# Patient Record
Sex: Male | Born: 1965 | Race: White | Hispanic: No | Marital: Married | Smoking: Former smoker
Health system: Southern US, Community
[De-identification: ages and names within clinical notes are randomized; demographics above are authoritative.]

## PROBLEM LIST (undated history)

## (undated) DIAGNOSIS — G4733 Obstructive sleep apnea (adult) (pediatric): Secondary | ICD-10-CM

## (undated) DIAGNOSIS — I10 Essential (primary) hypertension: Secondary | ICD-10-CM

## (undated) DIAGNOSIS — I639 Cerebral infarction, unspecified: Secondary | ICD-10-CM

## (undated) DIAGNOSIS — E119 Type 2 diabetes mellitus without complications: Secondary | ICD-10-CM

## (undated) DIAGNOSIS — M1990 Unspecified osteoarthritis, unspecified site: Secondary | ICD-10-CM

## (undated) DIAGNOSIS — J449 Chronic obstructive pulmonary disease, unspecified: Secondary | ICD-10-CM

## (undated) DIAGNOSIS — I82409 Acute embolism and thrombosis of unspecified deep veins of unspecified lower extremity: Secondary | ICD-10-CM

## (undated) DIAGNOSIS — I509 Heart failure, unspecified: Secondary | ICD-10-CM

## (undated) DIAGNOSIS — R569 Unspecified convulsions: Secondary | ICD-10-CM

## (undated) DIAGNOSIS — Z9889 Other specified postprocedural states: Secondary | ICD-10-CM

## (undated) DIAGNOSIS — J984 Other disorders of lung: Secondary | ICD-10-CM

## (undated) DIAGNOSIS — I6529 Occlusion and stenosis of unspecified carotid artery: Secondary | ICD-10-CM

## (undated) DIAGNOSIS — F32A Depression, unspecified: Secondary | ICD-10-CM

## (undated) DIAGNOSIS — F329 Major depressive disorder, single episode, unspecified: Secondary | ICD-10-CM

## (undated) HISTORY — PX: BRAIN SURGERY: SHX531

## (undated) HISTORY — PX: BRAIN TUMOR EXCISION: SHX577

## (undated) HISTORY — PX: CATARACT EXTRACTION: SUR2

---

## 2014-12-01 DIAGNOSIS — J449 Chronic obstructive pulmonary disease, unspecified: Secondary | ICD-10-CM | POA: Insufficient documentation

## 2017-11-10 DIAGNOSIS — I639 Cerebral infarction, unspecified: Secondary | ICD-10-CM

## 2017-11-10 HISTORY — DX: Cerebral infarction, unspecified: I63.9

## 2017-12-02 ENCOUNTER — Inpatient Hospital Stay (HOSPITAL_COMMUNITY): Payer: Medicare Other

## 2017-12-02 ENCOUNTER — Inpatient Hospital Stay (HOSPITAL_COMMUNITY)
Admission: EM | Admit: 2017-12-02 | Discharge: 2017-12-07 | DRG: 064 | Disposition: A | Discharge status: Rehabilitation Facility | Payer: Medicare Other | Attending: Family Medicine | Admitting: Family Medicine

## 2017-12-02 ENCOUNTER — Emergency Department (HOSPITAL_COMMUNITY): Payer: Medicare Other

## 2017-12-02 ENCOUNTER — Other Ambulatory Visit: Payer: Self-pay

## 2017-12-02 ENCOUNTER — Encounter (HOSPITAL_COMMUNITY): Payer: Self-pay | Admitting: Emergency Medicine

## 2017-12-02 DIAGNOSIS — I63511 Cerebral infarction due to unspecified occlusion or stenosis of right middle cerebral artery: Principal | ICD-10-CM | POA: Diagnosis present

## 2017-12-02 DIAGNOSIS — E1165 Type 2 diabetes mellitus with hyperglycemia: Secondary | ICD-10-CM | POA: Diagnosis not present

## 2017-12-02 DIAGNOSIS — E785 Hyperlipidemia, unspecified: Secondary | ICD-10-CM | POA: Diagnosis present

## 2017-12-02 DIAGNOSIS — G8194 Hemiplegia, unspecified affecting left nondominant side: Secondary | ICD-10-CM | POA: Diagnosis not present

## 2017-12-02 DIAGNOSIS — E119 Type 2 diabetes mellitus without complications: Secondary | ICD-10-CM

## 2017-12-02 DIAGNOSIS — G936 Cerebral edema: Secondary | ICD-10-CM | POA: Diagnosis present

## 2017-12-02 DIAGNOSIS — R471 Dysarthria and anarthria: Secondary | ICD-10-CM | POA: Diagnosis present

## 2017-12-02 DIAGNOSIS — R200 Anesthesia of skin: Secondary | ICD-10-CM | POA: Diagnosis not present

## 2017-12-02 DIAGNOSIS — E871 Hypo-osmolality and hyponatremia: Secondary | ICD-10-CM | POA: Diagnosis not present

## 2017-12-02 DIAGNOSIS — Z9119 Patient's noncompliance with other medical treatment and regimen: Secondary | ICD-10-CM | POA: Diagnosis not present

## 2017-12-02 DIAGNOSIS — I639 Cerebral infarction, unspecified: Secondary | ICD-10-CM | POA: Diagnosis present

## 2017-12-02 DIAGNOSIS — Z7901 Long term (current) use of anticoagulants: Secondary | ICD-10-CM | POA: Diagnosis not present

## 2017-12-02 DIAGNOSIS — Z86718 Personal history of other venous thrombosis and embolism: Secondary | ICD-10-CM

## 2017-12-02 DIAGNOSIS — Z66 Do not resuscitate: Secondary | ICD-10-CM | POA: Diagnosis present

## 2017-12-02 DIAGNOSIS — Z794 Long term (current) use of insulin: Secondary | ICD-10-CM

## 2017-12-02 DIAGNOSIS — R1310 Dysphagia, unspecified: Secondary | ICD-10-CM | POA: Diagnosis present

## 2017-12-02 DIAGNOSIS — Z7982 Long term (current) use of aspirin: Secondary | ICD-10-CM | POA: Diagnosis not present

## 2017-12-02 DIAGNOSIS — T50905A Adverse effect of unspecified drugs, medicaments and biological substances, initial encounter: Secondary | ICD-10-CM | POA: Diagnosis present

## 2017-12-02 DIAGNOSIS — E1169 Type 2 diabetes mellitus with other specified complication: Secondary | ICD-10-CM | POA: Diagnosis not present

## 2017-12-02 DIAGNOSIS — I10 Essential (primary) hypertension: Secondary | ICD-10-CM | POA: Diagnosis present

## 2017-12-02 DIAGNOSIS — E662 Morbid (severe) obesity with alveolar hypoventilation: Secondary | ICD-10-CM | POA: Diagnosis present

## 2017-12-02 DIAGNOSIS — R262 Difficulty in walking, not elsewhere classified: Secondary | ICD-10-CM | POA: Diagnosis present

## 2017-12-02 DIAGNOSIS — G92 Toxic encephalopathy: Secondary | ICD-10-CM | POA: Diagnosis present

## 2017-12-02 DIAGNOSIS — J449 Chronic obstructive pulmonary disease, unspecified: Secondary | ICD-10-CM | POA: Diagnosis present

## 2017-12-02 DIAGNOSIS — G4733 Obstructive sleep apnea (adult) (pediatric): Secondary | ICD-10-CM | POA: Diagnosis present

## 2017-12-02 DIAGNOSIS — E781 Pure hyperglyceridemia: Secondary | ICD-10-CM | POA: Diagnosis present

## 2017-12-02 DIAGNOSIS — I6523 Occlusion and stenosis of bilateral carotid arteries: Secondary | ICD-10-CM | POA: Diagnosis present

## 2017-12-02 DIAGNOSIS — I69354 Hemiplegia and hemiparesis following cerebral infarction affecting left non-dominant side: Secondary | ICD-10-CM | POA: Diagnosis not present

## 2017-12-02 DIAGNOSIS — Z79899 Other long term (current) drug therapy: Secondary | ICD-10-CM

## 2017-12-02 DIAGNOSIS — Z885 Allergy status to narcotic agent status: Secondary | ICD-10-CM

## 2017-12-02 DIAGNOSIS — D696 Thrombocytopenia, unspecified: Secondary | ICD-10-CM | POA: Diagnosis not present

## 2017-12-02 DIAGNOSIS — E1151 Type 2 diabetes mellitus with diabetic peripheral angiopathy without gangrene: Secondary | ICD-10-CM | POA: Diagnosis present

## 2017-12-02 DIAGNOSIS — I6521 Occlusion and stenosis of right carotid artery: Secondary | ICD-10-CM | POA: Diagnosis present

## 2017-12-02 DIAGNOSIS — F17210 Nicotine dependence, cigarettes, uncomplicated: Secondary | ICD-10-CM | POA: Diagnosis present

## 2017-12-02 DIAGNOSIS — E1142 Type 2 diabetes mellitus with diabetic polyneuropathy: Secondary | ICD-10-CM | POA: Diagnosis present

## 2017-12-02 DIAGNOSIS — E669 Obesity, unspecified: Secondary | ICD-10-CM | POA: Diagnosis not present

## 2017-12-02 DIAGNOSIS — Z9114 Patient's other noncompliance with medication regimen: Secondary | ICD-10-CM

## 2017-12-02 DIAGNOSIS — Z6838 Body mass index (BMI) 38.0-38.9, adult: Secondary | ICD-10-CM

## 2017-12-02 DIAGNOSIS — M25512 Pain in left shoulder: Secondary | ICD-10-CM | POA: Diagnosis present

## 2017-12-02 HISTORY — DX: Type 2 diabetes mellitus without complications: E11.9

## 2017-12-02 HISTORY — DX: Unspecified convulsions: R56.9

## 2017-12-02 HISTORY — DX: Unspecified osteoarthritis, unspecified site: M19.90

## 2017-12-02 HISTORY — DX: Chronic obstructive pulmonary disease, unspecified: J44.9

## 2017-12-02 HISTORY — DX: Essential (primary) hypertension: I10

## 2017-12-02 HISTORY — DX: Acute embolism and thrombosis of unspecified deep veins of unspecified lower extremity: I82.409

## 2017-12-02 LAB — I-STAT CHEM 8, ED
BUN: 12 mg/dL (ref 6–20)
CALCIUM ION: 1.13 mmol/L — AB (ref 1.15–1.40)
CHLORIDE: 100 mmol/L — AB (ref 101–111)
Creatinine, Ser: 0.7 mg/dL (ref 0.61–1.24)
GLUCOSE: 390 mg/dL — AB (ref 65–99)
HCT: 48 % (ref 39.0–52.0)
HEMOGLOBIN: 16.3 g/dL (ref 13.0–17.0)
Potassium: 4.1 mmol/L (ref 3.5–5.1)
SODIUM: 131 mmol/L — AB (ref 135–145)
TCO2: 24 mmol/L (ref 22–32)

## 2017-12-02 LAB — DIFFERENTIAL
BASOS ABS: 0.1 10*3/uL (ref 0.0–0.1)
Basophils Relative: 1 %
EOS ABS: 0.1 10*3/uL (ref 0.0–0.7)
Eosinophils Relative: 2 %
LYMPHS PCT: 31 %
Lymphs Abs: 2.4 10*3/uL (ref 0.7–4.0)
Monocytes Absolute: 0.7 10*3/uL (ref 0.1–1.0)
Monocytes Relative: 8 %
NEUTROS ABS: 4.6 10*3/uL (ref 1.7–7.7)
Neutrophils Relative %: 58 %

## 2017-12-02 LAB — URINALYSIS, ROUTINE W REFLEX MICROSCOPIC
BILIRUBIN URINE: NEGATIVE
Bacteria, UA: NONE SEEN
HGB URINE DIPSTICK: NEGATIVE
KETONES UR: NEGATIVE mg/dL
Leukocytes, UA: NEGATIVE
NITRITE: NEGATIVE
PH: 5 (ref 5.0–8.0)
Protein, ur: 100 mg/dL — AB
Specific Gravity, Urine: 1.028 (ref 1.005–1.030)

## 2017-12-02 LAB — RAPID URINE DRUG SCREEN, HOSP PERFORMED
Amphetamines: NOT DETECTED
Benzodiazepines: NOT DETECTED
Cocaine: NOT DETECTED
Opiates: NOT DETECTED
Tetrahydrocannabinol: NOT DETECTED

## 2017-12-02 LAB — CBC
HCT: 47.7 % (ref 39.0–52.0)
Hemoglobin: 17.3 g/dL — ABNORMAL HIGH (ref 13.0–17.0)
MCH: 31.5 pg (ref 26.0–34.0)
MCHC: 36.3 g/dL — AB (ref 30.0–36.0)
MCV: 86.7 fL (ref 78.0–100.0)
Platelets: 139 10*3/uL — ABNORMAL LOW (ref 150–400)
RBC: 5.5 MIL/uL (ref 4.22–5.81)
RDW: 13.7 % (ref 11.5–15.5)
WBC: 7.9 10*3/uL (ref 4.0–10.5)

## 2017-12-02 LAB — COMPREHENSIVE METABOLIC PANEL
ALBUMIN: 3.9 g/dL (ref 3.5–5.0)
ALK PHOS: 76 U/L (ref 38–126)
ALT: 23 U/L (ref 17–63)
AST: 19 U/L (ref 15–41)
Anion gap: 11 (ref 5–15)
BILIRUBIN TOTAL: 0.5 mg/dL (ref 0.3–1.2)
BUN: 12 mg/dL (ref 6–20)
CO2: 22 mmol/L (ref 22–32)
Calcium: 8.9 mg/dL (ref 8.9–10.3)
Chloride: 94 mmol/L — ABNORMAL LOW (ref 101–111)
Creatinine, Ser: 0.77 mg/dL (ref 0.61–1.24)
GFR calc Af Amer: >60 mL/min (ref >60)
GFR calc non Af Amer: >60 mL/min (ref >60)
GLUCOSE: 378 mg/dL — AB (ref 65–99)
POTASSIUM: 3.9 mmol/L (ref 3.5–5.1)
Sodium: 127 mmol/L — ABNORMAL LOW (ref 135–145)
Total Protein: 6.4 g/dL — ABNORMAL LOW (ref 6.5–8.1)

## 2017-12-02 LAB — APTT: aPTT: 32 seconds (ref 24–36)

## 2017-12-02 LAB — PROTIME-INR
INR: 0.85
PROTHROMBIN TIME: 11.6 s (ref 11.4–15.2)

## 2017-12-02 LAB — ETHANOL

## 2017-12-02 MED ORDER — SENNOSIDES-DOCUSATE SODIUM 8.6-50 MG PO TABS
1.0000 | ORAL_TABLET | Freq: Every evening | ORAL | Status: DC | PRN
  Administered 2017-12-05: 1 via ORAL
  Filled 2017-12-02 (×2): qty 1

## 2017-12-02 MED ORDER — ATORVASTATIN CALCIUM 20 MG PO TABS
20.0000 mg | ORAL_TABLET | Freq: Every day | ORAL | Status: DC
  Administered 2017-12-03 – 2017-12-04 (×2): 20 mg via ORAL
  Filled 2017-12-02 (×2): qty 1

## 2017-12-02 MED ORDER — STROKE: EARLY STAGES OF RECOVERY BOOK
Freq: Once | Status: DC
  Filled 2017-12-02 (×2): qty 1

## 2017-12-02 MED ORDER — ASPIRIN 325 MG PO TABS
325.0000 mg | ORAL_TABLET | Freq: Every day | ORAL | Status: DC
  Administered 2017-12-03 – 2017-12-06 (×4): 325 mg via ORAL
  Filled 2017-12-02 (×4): qty 1

## 2017-12-02 MED ORDER — GABAPENTIN 100 MG PO CAPS
100.0000 mg | ORAL_CAPSULE | Freq: Three times a day (TID) | ORAL | Status: DC
  Administered 2017-12-03 – 2017-12-06 (×13): 100 mg via ORAL
  Filled 2017-12-02 (×13): qty 1

## 2017-12-02 MED ORDER — SODIUM CHLORIDE 0.9 % IV SOLN
INTRAVENOUS | Status: DC
  Administered 2017-12-03: 01:00:00 via INTRAVENOUS

## 2017-12-02 MED ORDER — NICOTINE 21 MG/24HR TD PT24
21.0000 mg | MEDICATED_PATCH | Freq: Once | TRANSDERMAL | Status: AC
  Administered 2017-12-02: 21 mg via TRANSDERMAL
  Filled 2017-12-02: qty 1

## 2017-12-02 MED ORDER — ASPIRIN 300 MG RE SUPP: 300.0000 mg | Freq: Every day | RECTAL | Status: DC

## 2017-12-02 MED ORDER — FLUOXETINE HCL 20 MG PO CAPS
20.0000 mg | ORAL_CAPSULE | Freq: Every day | ORAL | Status: DC
  Administered 2017-12-03 – 2017-12-06 (×4): 20 mg via ORAL
  Filled 2017-12-02 (×4): qty 1

## 2017-12-02 MED ORDER — ENOXAPARIN SODIUM 40 MG/0.4ML ~~LOC~~ SOLN
40.0000 mg | SUBCUTANEOUS | Status: DC
  Administered 2017-12-03: 40 mg via SUBCUTANEOUS
  Filled 2017-12-02: qty 0.4

## 2017-12-02 MED ORDER — GEMFIBROZIL 600 MG PO TABS
600.0000 mg | ORAL_TABLET | Freq: Two times a day (BID) | ORAL | Status: DC
  Administered 2017-12-03: 600 mg via ORAL
  Filled 2017-12-02 (×2): qty 1

## 2017-12-02 MED ORDER — ACETAMINOPHEN 325 MG PO TABS
650.0000 mg | ORAL_TABLET | ORAL | Status: DC | PRN
  Administered 2017-12-03 – 2017-12-06 (×6): 650 mg via ORAL
  Filled 2017-12-02 (×6): qty 2

## 2017-12-02 MED ORDER — ACETAMINOPHEN 650 MG RE SUPP: 650.0000 mg | RECTAL | Status: DC | PRN

## 2017-12-02 MED ORDER — INSULIN ASPART 100 UNIT/ML ~~LOC~~ SOLN
10.0000 [IU] | Freq: Once | SUBCUTANEOUS | Status: AC
  Administered 2017-12-02: 10 [IU] via SUBCUTANEOUS
  Filled 2017-12-02: qty 1

## 2017-12-02 MED ORDER — INSULIN GLARGINE 100 UNIT/ML ~~LOC~~ SOLN
25.0000 [IU] | Freq: Two times a day (BID) | SUBCUTANEOUS | Status: DC
  Administered 2017-12-03 – 2017-12-04 (×4): 25 [IU] via SUBCUTANEOUS
  Filled 2017-12-02 (×6): qty 0.25

## 2017-12-02 MED ORDER — ACETAMINOPHEN 160 MG/5ML PO SOLN: 650.0000 mg | ORAL | Status: DC | PRN

## 2017-12-02 MED ORDER — MOMETASONE FURO-FORMOTEROL FUM 200-5 MCG/ACT IN AERO
2.0000 | INHALATION_SPRAY | Freq: Two times a day (BID) | RESPIRATORY_TRACT | Status: DC
  Administered 2017-12-03 – 2017-12-07 (×9): 2 via RESPIRATORY_TRACT
  Filled 2017-12-02 (×2): qty 8.8

## 2017-12-02 NOTE — ED Notes (Signed)
Family mbr reports that pt was sitting at the table tonight and was eating but pt was holding the food in his mouth and that the family had to tell the pt to swallow,

## 2017-12-02 NOTE — ED Provider Notes (Signed)
[facility] EMERGENCY DEPARTMENT Provider Note   CSN: [id number] Arrival date & time: 12/02/17  1936     History   Chief Complaint Chief Complaint  Patient presents with  . Numbness    HPI Zachary Deleon is a 52 y.o. male.  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  The patient denies any prior history of stroke, he and his wife for the primary historians, he states that he has had some left arm numbness for the last 3 days and has had some difficulty using that hand.  Specifically he reports and the wife corroborates his story that today while he was on his way to the hospital he was walking down a staircase and was having difficulty using his left hand to grab onto a railing as he descended the stairs.  He is also had some difficulty with speech which the wife reports occurred today for the first time.  The speech was slurred, he was having difficulty getting food onto his fork, he was having difficulty swallowing, he was having difficulty getting his speech out and what came out did not sound normal.  This seems to have resolved according to the wife stating that his speech is back to its normal baseline.  The patient denies any symptoms in his legs and has been able to walk.  He denies any difficulty with vision outside of his normal baseline poor vision.  He does not wear contact lenses and only wears glasses to read.  He denies chest pain shortness of breath vomiting diarrhea fever or swelling of the legs and denies any rashes.  The symptoms in his left arm have been persistent for approximately 3 or more days, the speech was transient lasting less than 1 hour and occurred 2 hours ago while they were at the dinner table.  He no longer has any difficulty with his speech  according to his wife who states he is back to his baseline with regards to that  Past Medical History:  Diagnosis Date  . Diabetes mellitus without complication (HCC)   . DVT (deep venous thrombosis) (HCC)     There are no active problems to display for this patient.   Past Surgical History:  Procedure Laterality Date  . BRAIN SURGERY    . CATARACT EXTRACTION          Home Medications    Prior to Admission medications   Not on File    Family History No family history on file.  Social History Social History   Tobacco Use  . Smoking status: Current Every Day Smoker  . Smokeless tobacco: Never Used  Substance Use Topics  . Alcohol use: Yes  . Drug use: Not Currently     Allergies   Morphine and related   Review of Systems Review of Systems  All other systems reviewed and are negative.    Physical Exam Updated Vital Signs BP (!) 160/97   Pulse 93   Temp 97.9 F (36.6 C) (Oral)   Resp 20   Ht 6\' 2"  (1.88 m)   Wt 136.1 kg (300 lb)   SpO2 98%   BMI 38.52 kg/m   Physical Exam  Constitutional: He appears well-developed and well-nourished. No distress.  HENT:  Head: Normocephalic and atraumatic.  Mouth/Throat: Oropharynx is clear and moist. No oropharyngeal exudate.  Eyes: Pupils are equal, round, and reactive to light. Conjunctivae and EOM are normal. Right eye exhibits no discharge. Left eye exhibits no discharge. No scleral icterus.  Neck: Normal range of motion. Neck supple. No JVD present. No thyromegaly present.  Cardiovascular: Normal rate, regular rhythm, normal heart sounds and intact distal pulses. Exam reveals no gallop and no friction rub.  No murmur heard. Pulmonary/Chest: Effort normal and breath sounds normal. No respiratory distress. He has no wheezes. He has no rales.  Abdominal: Soft. Bowel sounds are normal. He exhibits no distension and no mass. There is no tenderness.  Musculoskeletal: Normal range of motion. He exhibits no edema  or tenderness.  Lymphadenopathy:    He has no cervical adenopathy.  Neurological: He is alert. Coordination normal.  The patient has no facial droop, his speech is clear and goal-directed, cranial nerves III through XII are normal including sensation of the face.  Bilateral lower extremities with normal strength and able to straight leg raise bilaterally.  He is able to grip with both hands 5 out of 5 strength, he does have some decreased sensation over the left arm, he does have some difficulty with finger-nose-finger ataxia on the left side with some dysmetria however there is no pronator drift.  His level of attention is normal as is his mental status.  NIH scale of 2 for some dysmetria and some sensory deficit on the left arm  Skin: Skin is warm and dry. No rash noted. No erythema.  Psychiatric: He has a normal mood and affect. His behavior is normal.  Nursing note and vitals reviewed.    ED Treatments / Results  Labs (all labs ordered are listed, but only abnormal results are displayed) Labs Reviewed  ETHANOL  PROTIME-INR  APTT  CBC  DIFFERENTIAL  COMPREHENSIVE METABOLIC PANEL  RAPID URINE DRUG SCREEN, HOSP PERFORMED  URINALYSIS, ROUTINE W REFLEX MICROSCOPIC  I-STAT CHEM 8, ED  I-STAT TROPONIN, ED    EKG None  Radiology No results found.  Procedures Procedures (including critical care time)  Medications Ordered in ED Medications - No data to display   Initial Impression / Assessment and Plan / ED Course  I have reviewed the triage vital signs and the nursing notes.  Pertinent labs & imaging results that were available during my care of the patient were reviewed by me and considered in my medical decision making (see chart for details).  Clinical Course as of Dec 02 2337  Wynelle LinkSun Dec 02, 2017  2115 CT scan shows postsurgical changes, no acute findings.  The symptoms are consistent with a mild stroke, he will need to be admitted to the hospital.  We will page the  hospitalist for admission at 915 p.m.   [BM]    Clinical Course User Index [BM] Eber HongMiller, Niobe Dick, MD    It is not exactly clear where the patient's ischemia area is however he does appear to have symptoms that could be consistent with an ischemic stroke.  He will go for CT scan of the brain followed by lab work.  He will likely need to be admitted to the hospital for stroke work-up.  With his prior history of tumor 22 years ago as long as it was benign I do not think this will be a recurrence but CT scan will help decipher further the cause of the symptoms.  EKG is unremarkable.  The patient is not in atrial fibrillation but is hypetensive  Vitals:   12/02/17 1941 12/02/17 1944  BP:  (!) 160/97  Pulse:  93  Resp:  20  Temp:  97.9 F (36.6 C)  TempSrc:  Oral  SpO2:  98%  Weight: 136.1 kg (300 lb)   Height: 6\' 2"  (1.88 m)      Final Clinical Impressions(s) / ED Diagnoses   Final diagnoses:  Left arm numbness  Stroke (cerebrum) (HCC)  Stroke (cerebrum) (HCC)       Eber Hong, MD 12/02/17 2341

## 2017-12-02 NOTE — ED Triage Notes (Signed)
Pt c/o numbness to the left arm and hand, he states has been going on for 3-4 days. Family noticed pt has slurred speech at dinner tonight.

## 2017-12-02 NOTE — ED Notes (Signed)
Patient transported to X-ray 

## 2017-12-02 NOTE — ED Notes (Signed)
Pt returned from CT °

## 2017-12-02 NOTE — ED Notes (Signed)
Patient transported to CT 

## 2017-12-02 NOTE — H&P (Signed)
TRH H&P    Patient Demographics:    Zachary Deleon, is a 52 y.o. male  MRN: [id number]  DOB - [birth year]  Admit Date - 12/02/2017  Referring MD/NP/PA: Dr. Hyacinth Meeker  Outpatient Primary MD for the patient is Nelma Rothman, NP  Patient coming from: Home  Chief complaint-numbness of left arm   HPI:    Zachary Deleon  is a 52 y.o. male, with history of diabetes mellitus, tobacco abuse, history of DVT in the past, hypertension who has been noncompliant with his medications and has not taken any medication for past 6 months came to hospital with left arm numbness for past 3 days.  As per patient's wife he was having difficulty walking downstairs case and with difficulty using his left hand to grab onto rolling as he descended the stairs.  Patient also had difficulty with speech.  Speech was slurred. Patient denies difficulty walking.  Denies chest pain or shortness of breath. No history of stroke or seizures in the past. No nausea vomiting or diarrhea. Symptoms have been persistent for past 3 days. In the ED CT of the head was done which was negative for stroke. Patient will be admitted for further stroke work-up.    Review of systems:      All other systems reviewed and are negative.   With Past History of the following :    Past Medical History:  Diagnosis Date  . Arthritis   . COPD (chronic obstructive pulmonary disease) (HCC)   . Diabetes mellitus without complication (HCC)   . DVT (deep venous thrombosis) (HCC)   . Hypertension   . Seizures (HCC)       Past Surgical History:  Procedure Laterality Date  . BRAIN SURGERY    . BRAIN TUMOR EXCISION     performed at [facility]  . CATARACT EXTRACTION        Social History:      Social History   Tobacco Use  . Smoking status: Current Every Day Smoker  . Smokeless tobacco: Never Used  Substance Use Topics  . Alcohol use: Yes       Family History :   Family history of stroke or heart disease   Home Medications:   Prior to Admission medications   Medication Sig Start Date End Date Taking? Authorizing Provider  albuterol (VENTOLIN HFA) 108 (90 Base) MCG/ACT inhaler Inhale 2 puffs into the lungs every 4 (four) hours as needed for wheezing or shortness of breath.   Yes [provider]  amLODipine (NORVASC) 10 MG tablet Take 10 mg by mouth daily.    [provider]  apixaban (ELIQUIS) 5 MG TABS tablet Take 5 mg by mouth 2 (two) times daily.    [provider]  atorvastatin (LIPITOR) 20 MG tablet Take 20 mg by mouth daily.    [provider]  cilostazol (PLETAL) 100 MG tablet Take 100 mg by mouth 2 (two) times daily.    [provider]  FLUoxetine (PROZAC) 20 MG capsule Take 20 mg by mouth daily.  [provider]  Fluticasone-Salmeterol (ADVAIR) 250-50 MCG/DOSE AEPB Inhale 1 puff into the lungs 2 (two) times daily.    [provider]  gabapentin (NEURONTIN) 100 MG capsule Take 100 mg by mouth 3 (three) times daily.    [provider]  gemfibrozil (LOPID) 600 MG tablet Take 600 mg by mouth 2 (two) times daily before a meal.    [provider]  Insulin Glargine (LANTUS SOLOSTAR) 100 UNIT/ML Solostar Pen Inject 50 Units into the skin 2 (two) times daily.    [provider]  lisinopril-hydrochlorothiazide (PRINZIDE,ZESTORETIC) 20-12.5 MG tablet Take 1 tablet by mouth daily.    [provider]  metFORMIN (GLUCOPHAGE) 1000 MG tablet Take 1,000 mg by mouth 2 (two) times daily with a meal.    [provider]  tiotropium (SPIRIVA) 18 MCG inhalation capsule Place 18 mcg into inhaler and inhale daily.    [provider]     Allergies:     Allergies  Allergen Reactions  . Morphine And Related Other (See Comments)    Makes pt hyper     Physical Exam:   Vitals  Blood pressure 136/88, pulse 84, temperature (!)  97.5 F (36.4 C), resp. rate 19, height 6\' 2"  (1.88 m), weight 136.1 kg (300 lb), SpO2 100 %.  1.  General: Appears in no acute distress  2. Psychiatric:  Intact judgement and  insight, awake alert, oriented x 3.  3. Neurologic: No focal neurological deficits, all cranial nerves intact.Strength 5/5 all 4 extremities, sensation is reduced in left upper extremity, plantars down going.  Finger-to-nose abnormal on left side, positive dysmetria  4. Eyes :  anicteric sclerae, moist conjunctivae with no lid lag. PERRLA.  5. ENMT:  Oropharynx clear with moist mucous membranes and good dentition  6. Neck:  supple, no cervical lymphadenopathy appriciated, No thyromegaly  7. Respiratory : Normal respiratory effort, good air movement bilaterally,clear to  auscultation bilaterally  8. Cardiovascular : RRR, no gallops, rubs or murmurs, no leg edema  9. Gastrointestinal:  Positive bowel sounds, abdomen soft, non-tender to palpation,no hepatosplenomegaly, no rigidity or guarding       10. Skin:  No cyanosis, normal texture and turgor, no rash, lesions or ulcers  11.Musculoskeletal:  Good muscle tone,  joints appear normal , no effusions,  normal range of motion    Data Review:    CBC Recent Labs  Lab 12/02/17 1954 12/02/17 2016  WBC 7.9  --   HGB 17.3* 16.3  HCT 47.7 48.0  PLT 139*  --   MCV 86.7  --   MCH 31.5  --   MCHC 36.3*  --   RDW 13.7  --   LYMPHSABS 2.4  --   MONOABS 0.7  --   EOSABS 0.1  --   BASOSABS 0.1  --    ------------------------------------------------------------------------------------------------------------------  Chemistries  Recent Labs  Lab 12/02/17 1954 12/02/17 2016  NA 127* 131*  K 3.9 4.1  CL 94* 100*  CO2 22  --   GLUCOSE 378* 390*  BUN 12 12  CREATININE 0.77 0.70  CALCIUM 8.9  --   AST 19  --   ALT 23  --   ALKPHOS 76  --   BILITOT 0.5  --     ------------------------------------------------------------------------------------------------------------------  ------------------------------------------------------------------------------------------------------------------ GFR: Estimated Creatinine Clearance: 156.8 mL/min (by C-G formula based on SCr of 0.7 mg/dL). Liver Function Tests: Recent Labs  Lab 12/02/17 1954  AST 19  ALT 23  ALKPHOS 76  BILITOT  0.5  PROT 6.4*  ALBUMIN 3.9   No results for input(s): LIPASE, AMYLASE in the last 168 hours. No results for input(s): AMMONIA in the last 168 hours. Coagulation Profile: Recent Labs  Lab 12/02/17 1954  INR 0.85   Cardiac Enzymes: No results for input(s): CKTOTAL, CKMB, CKMBINDEX, TROPONINI in the last 168 hours. BNP (last 3 results) No results for input(s): PROBNP in the last 8760 hours. HbA1C: No results for input(s): HGBA1C in the last 72 hours. CBG: No results for input(s): GLUCAP in the last 168 hours. Lipid Profile: No results for input(s): CHOL, HDL, LDLCALC, TRIG, CHOLHDL, LDLDIRECT in the last 72 hours. Thyroid Function Tests: No results for input(s): TSH, T4TOTAL, FREET4, T3FREE, THYROIDAB in the last 72 hours. Anemia Panel: No results for input(s): VITAMINB12, FOLATE, FERRITIN, TIBC, IRON, RETICCTPCT in the last 72 hours.  --------------------------------------------------------------------------------------------------------------- Urine analysis:    Component Value Date/Time   COLORURINE YELLOW 12/02/2017 2054   APPEARANCEUR CLEAR 12/02/2017 2054   LABSPEC 1.028 12/02/2017 2054   PHURINE 5.0 12/02/2017 2054   GLUCOSEU >=500 (A) 12/02/2017 2054   HGBUR NEGATIVE 12/02/2017 2054   BILIRUBINUR NEGATIVE 12/02/2017 2054   KETONESUR NEGATIVE 12/02/2017 2054   PROTEINUR 100 (A) 12/02/2017 2054   NITRITE NEGATIVE 12/02/2017 2054   LEUKOCYTESUR NEGATIVE 12/02/2017 2054      Imaging Results:    Ct Head Wo Contrast  Result Date:  12/02/2017 CLINICAL DATA:  Altered mental status. Slurred speech. Left upper extremity numbness. EXAM: CT HEAD WITHOUT CONTRAST TECHNIQUE: Contiguous axial images were obtained from the base of the skull through the vertex without intravenous contrast. COMPARISON:  None. FINDINGS: Brain: Postsurgical changes are seen involving the right cerebellar hemisphere. Mild chronic ischemic white matter disease is noted. No mass effect or midline shift is noted. Ventricular size is within normal limits. There is no evidence of mass lesion, hemorrhage or acute infarction. Vascular: No hyperdense vessel or unexpected calcification. Skull: Status post right occipital craniotomy. No acute abnormality seen involving the calvarium. Sinuses/Orbits: No acute finding. Other: None. IMPRESSION: Postsurgical changes seen in the right cerebellar hemisphere. Mild chronic ischemic white matter disease. No acute intracranial abnormality seen. Electronically Signed   By: Lupita RaiderJames  Green Jr, M.D.   On: 12/02/2017 20:48    My personal review of EKG: Rhythm NSR   Assessment & Plan:    Active Problems:   Stroke [facility](HCC)   Type 2 diabetes mellitus without complication (HCC)   1. Left- arm numbness-concerning for stroke, will place inpatient for stroke work-up.  Obtain MRI/MRA brain, echocardiogram, carotid Dopplers in a.m.  Check lipid profile, hemoglobin A1c.  Will consult neurology in a.m.  Patient has been noncompliant with his medications at home.  Start aspirin 325 mg p.o. Daily. 2. History of DVT-patient was supposed to take Eliquis but has not been taking it for past 6 months.  He has been noncompliant with his medications. 3. Diabetes mellitus-patient has not been taking Lantus 15 units subcu twice a day, will start Lantus 25 units subcu twice daily, sliding scale insulin with NovoLog. 4. Hypertension-patient has vomiting any medications at home.  Hold amlodipine for permissive hypertension. 5. Tobacco abuse-patient smokes 2  packs/day, counseled.   DVT Prophylaxis-   Lovenox   AM Labs Ordered, also please review Full Orders  Family Communication: Admission, patients condition and plan of care including tests being ordered have been discussed with the patient and his wife and daughter at bedside* who indicate understanding and agree with the plan and Code  Status.  Code Status: DNR  Admission status: Inpatient  Time spent in minutes : 60 minutes   Meredeth Ide M.D on 12/02/2017 at 10:53 PM  Between 7am to 7pm - Pager - [phone]. After 7pm go to [email or web address] - password [facility]  [facility] - Office  [phone]

## 2017-12-03 ENCOUNTER — Inpatient Hospital Stay (HOSPITAL_COMMUNITY): Payer: Medicare Other

## 2017-12-03 DIAGNOSIS — I10 Essential (primary) hypertension: Secondary | ICD-10-CM

## 2017-12-03 LAB — LIPID PANEL
Cholesterol: 279 mg/dL — ABNORMAL HIGH (ref 0–200)
LDL Cholesterol: UNDETERMINED mg/dL (ref 0–99)
TRIGLYCERIDES: 1256 mg/dL — AB (ref <150)
VLDL: UNDETERMINED mg/dL (ref 0–40)

## 2017-12-03 LAB — GLUCOSE, CAPILLARY
GLUCOSE-CAPILLARY: 292 mg/dL — AB (ref 65–99)
GLUCOSE-CAPILLARY: 225 mg/dL — AB (ref 65–99)
Glucose-Capillary: 284 mg/dL — ABNORMAL HIGH (ref 65–99)

## 2017-12-03 LAB — ECHOCARDIOGRAM COMPLETE
HEIGHTINCHES: 74 in
Weight: 4102.32 oz

## 2017-12-03 LAB — CBG MONITORING, ED
GLUCOSE-CAPILLARY: 275 mg/dL — AB (ref 65–99)
Glucose-Capillary: 216 mg/dL — ABNORMAL HIGH (ref 65–99)

## 2017-12-03 LAB — HEMOGLOBIN A1C
Hgb A1c MFr Bld: 11.4 % — ABNORMAL HIGH (ref 4.8–5.6)
MEAN PLASMA GLUCOSE: 280.48 mg/dL

## 2017-12-03 MED ORDER — BUTALBITAL-APAP-CAFFEINE 50-325-40 MG PO TABS
1.0000 | ORAL_TABLET | ORAL | Status: DC | PRN
  Administered 2017-12-03 – 2017-12-04 (×5): 1 via ORAL
  Filled 2017-12-03 (×6): qty 1

## 2017-12-03 MED ORDER — INSULIN ASPART 100 UNIT/ML ~~LOC~~ SOLN
0.0000 [IU] | Freq: Four times a day (QID) | SUBCUTANEOUS | Status: DC
  Administered 2017-12-03: 3 [IU] via SUBCUTANEOUS

## 2017-12-03 MED ORDER — OXYCODONE-ACETAMINOPHEN 5-325 MG PO TABS
1.0000 | ORAL_TABLET | Freq: Once | ORAL | Status: AC
  Administered 2017-12-03: 1 via ORAL
  Filled 2017-12-03: qty 1

## 2017-12-03 MED ORDER — GADOBENATE DIMEGLUMINE 529 MG/ML IV SOLN
20.0000 mL | Freq: Once | INTRAVENOUS | Status: AC | PRN
  Administered 2017-12-03: 20 mL via INTRAVENOUS

## 2017-12-03 MED ORDER — INSULIN ASPART 100 UNIT/ML ~~LOC~~ SOLN
0.0000 [IU] | Freq: Four times a day (QID) | SUBCUTANEOUS | Status: DC
Start: 2017-12-03 — End: 2017-12-03
  Administered 2017-12-03: 3 [IU] via SUBCUTANEOUS
  Administered 2017-12-03: 5 [IU] via SUBCUTANEOUS
  Filled 2017-12-03: qty 1

## 2017-12-03 MED ORDER — ENOXAPARIN SODIUM 80 MG/0.8ML ~~LOC~~ SOLN
70.0000 mg | SUBCUTANEOUS | Status: DC
  Administered 2017-12-03 – 2017-12-06 (×4): 70 mg via SUBCUTANEOUS
  Filled 2017-12-03 (×4): qty 0.8

## 2017-12-03 MED ORDER — LORAZEPAM 1 MG PO TABS
1.0000 mg | ORAL_TABLET | Freq: Once | ORAL | Status: AC
  Administered 2017-12-03: 1 mg via ORAL
  Filled 2017-12-03: qty 1

## 2017-12-03 MED ORDER — FENOFIBRATE 160 MG PO TABS
160.0000 mg | ORAL_TABLET | Freq: Every day | ORAL | Status: DC
  Administered 2017-12-03 – 2017-12-06 (×4): 160 mg via ORAL
  Filled 2017-12-03 (×4): qty 1

## 2017-12-03 NOTE — ED Notes (Addendum)
Report given to Wallace CullensBree, RN for room 319 by Trey PaulaJeff at this time.

## 2017-12-03 NOTE — Progress Notes (Signed)
Dr Clearnce SorrelPurohit paged and made aware of carotid US results

## 2017-12-03 NOTE — Consult Note (Signed)
Shadeland A. Merlene Laughter, MD     [email or web address]          Zachary Deleon is an 52 y.o. male.   ASSESSMENT/PLAN: 1. ACUTE ONSET OF DYSARTHRIA, LEFT UPPER EXTREMITY ATAXIA AND LEFT-SIDED WEAKNESS:  This symptoms suggest subcortical /  Deep  Lacunar infarct.  Risk factors poorly controlled hypertension, diabetes , dyslipidemia and age.  The patient has been started on aspirin.    Patient was on Eliquis but was not compliant with this.  We will hold this for now. The typical workup is pending.  He also shows good to receive physical therapy, occupational therapy and speech.  I did discuss with the patient that he needs to be compliant with treatment.  2.   Obstructive sleep apnea syndrome on treated:  Outpatient testing is recommended.   3.  Severe dyslipidemia:  The patient statin should be maximized.      The patient is a 52 year old right-handed white male who presents with the acute onset of severe dysarthria and right-sided hemi pareses.  He apparently has a significant history diabetes and other medical problems but apparently has not been compliant with his medications including his injection for insulin.  The reasons are not quite clear.  His wife does help with the administration of his medications.  The patient has had some chronic issues including chronic erectile dysfunction and chronic pain issue.  The patient tells me that he fell in the emergency room after presenting to the hospital and since then he has had significant pain involving the left upper extremity and left hip.  He has had difficulties ambulating due to his symptoms.  He does not report having loss of consciousness, dyspnea or chest pain.  Patient tells me that he has a known history of obstructive sleep apnea syndrome but is currently not treated.  The reasons given that he is unable to afford his CPAP machine. The review of systems otherwise negative.      GENERAL:  This is a pleasant obese male  who is in no acute distress.  HEENT:   The patient has a very large neck and large tongue with crowded posterior airspace.  Neck is supple.  ABDOMEN: Soft  EXTREMITIES: There is brown in discoloration of the distal legs with trace ankle edema.   There is significant pain on range of motion involving the left shoulder and left hip.  BACK: Normal alignment.  SKIN: Normal by inspection.    MENTAL STATUS: Alert and oriented. Speech, language and cognition are generally intact. Judgment and insight normal.   CRANIAL NERVES: Pupils are equal, round and reactive to light and accommodation; extraocular movements are full, there is no significant nystagmus; upper and lower facial muscles are normal in strength and symmetric, there is no flattening of the nasolabial folds; tongue is midline; uvula is midline; shoulder elevation is normal.  MOTOR:   There is a significant drift of the left upper extremity and left leg.  Left deltoid 4/5.  Hand grip 5. Left hip flexion 4/5 and dorsiflexion 3/5.  There is no drift on the right side.  The right side shows normal tone, bulk and strength.  COORDINATION: Left finger to nose shows significant dysmetria, right finger to nose is normal, No rest tremor; no intention tremor; no postural tremor; no bradykinesia.  REFLEXES: Deep tendon reflexes are symmetrical and normal. Plantar responses are flexor bilaterally.   SENSATION: Normal to light touch and temperature.  There is no evidence of neglect.  NIH stroke scale 1, 1, 1 total 3.  Blood pressure (!) 164/93, pulse 73, temperature (!) 97.5 F (36.4 C), temperature source Oral, resp. rate 18, height '6\' 2"'$  (1.88 m), weight 300 lb (136.1 kg), SpO2 95 %.  Past Medical History:  Diagnosis Date  . Arthritis   . COPD (chronic obstructive pulmonary disease) ([place])   . Diabetes mellitus without complication ([place])   . DVT (deep venous thrombosis) ([place])   . Hypertension   . Seizures ([place])     Past Surgical  History:  Procedure Laterality Date  . BRAIN SURGERY    . BRAIN TUMOR EXCISION     performed at [facility]      No family history on file.  Social History:  reports that he has been smoking.  He has never used smokeless tobacco. He reports that he drinks alcohol. He reports that he has current or past drug history.  Allergies:  Allergies  Allergen Reactions  . Morphine And Related Other (See Comments)    Makes pt hyper    Medications: Prior to Admission medications   Medication Sig Start Date End Date Taking? Authorizing Provider  albuterol (VENTOLIN HFA) 108 (90 Base) MCG/ACT inhaler Inhale 2 puffs into the lungs every 4 (four) hours as needed for wheezing or shortness of breath.   Yes [provider]  amLODipine (NORVASC) 10 MG tablet Take 10 mg by mouth daily.    [provider]  apixaban (ELIQUIS) 5 MG TABS tablet Take 5 mg by mouth 2 (two) times daily.    [provider]  atorvastatin (LIPITOR) 20 MG tablet Take 20 mg by mouth daily.    [provider]  cilostazol (PLETAL) 100 MG tablet Take 100 mg by mouth 2 (two) times daily.    [provider]  FLUoxetine (PROZAC) 20 MG capsule Take 20 mg by mouth daily.    [provider]  Fluticasone-Salmeterol (ADVAIR) 250-50 MCG/DOSE AEPB Inhale 1 puff into the lungs 2 (two) times daily.    [provider]  gabapentin (NEURONTIN) 100 MG capsule Take 100 mg by mouth 3 (three) times daily.    [provider]  gemfibrozil (LOPID) 600 MG tablet Take 600 mg by mouth 2 (two) times daily before a meal.    [provider]  Insulin Glargine (LANTUS SOLOSTAR) 100 UNIT/ML Solostar Pen Inject 50 Units into the skin 2 (two) times daily.    [provider]  lisinopril-hydrochlorothiazide (PRINZIDE,ZESTORETIC) 20-12.5 MG tablet Take 1 tablet by mouth daily.    [provider]  metFORMIN (GLUCOPHAGE) 1000 MG tablet Take 1,000 mg by mouth  2 (two) times daily with a meal.    [provider]  tiotropium (SPIRIVA) 18 MCG inhalation capsule Place 18 mcg into inhaler and inhale daily.    [provider]    Scheduled Meds: .  stroke: mapping our early stages of recovery book   Does not apply Once  . aspirin  300 mg Rectal Daily   Or  . aspirin  325 mg Oral Daily  . atorvastatin  20 mg Oral Daily  . enoxaparin (LOVENOX) injection  70 mg Subcutaneous Q24H  . FLUoxetine  20 mg Oral Daily  . gabapentin  100 mg Oral TID  . gemfibrozil  600 mg Oral BID AC  . insulin aspart  0-9 Units Subcutaneous Q6H  . insulin glargine  25 Units Subcutaneous BID  . mometasone-formoterol  2 puff Inhalation BID  . nicotine  21 mg Transdermal Once   Continuous Infusions: . sodium chloride 10 mL/hr at 12/03/17 0032   PRN Meds:.acetaminophen **OR** acetaminophen (TYLENOL) oral liquid 160 mg/5 mL **OR** acetaminophen, senna-docusate     Results for orders placed or performed during the hospital encounter of 12/02/17 (from the past 48 hour(s))  Ethanol     Status: None   Collection Time: 12/02/17  7:54 PM  Result Value Ref Range   Alcohol, Ethyl (B) <10 <10 mg/dL    Comment: (NOTE) Lowest detectable limit for serum alcohol is 10 mg/dL. For medical purposes only. Performed at [facility], [address] [place], [place] [address]   Protime-INR     Status: None   Collection Time: 12/02/17  7:54 PM  Result Value Ref Range   Prothrombin Time 11.6 11.4 - 15.2 seconds   INR 0.85     Comment: Performed at [facility], [address], [place], [place] [address]  APTT     Status: None   Collection Time: 12/02/17  7:54 PM  Result Value Ref Range   aPTT 32 24 - 36 seconds    Comment: Performed at [facility], [address] [place], [place] [address]  CBC     Status: Abnormal   Collection Time: 12/02/17  7:54 PM  Result Value Ref Range   WBC 7.9 4.0 - 10.5 K/uL   RBC 5.50 4.22 - 5.81 MIL/uL   Hemoglobin 17.3 (H) 13.0 - 17.0  g/dL   HCT 47.7 39.0 - 52.0 %   MCV 86.7 78.0 - 100.0 fL   MCH 31.5 26.0 - 34.0 pg   MCHC 36.3 (H) 30.0 - 36.0 g/dL   RDW 13.7 11.5 - 15.5 %   Platelets 139 (L) 150 - 400 K/uL    Comment: Performed at [facility], [address], [place], [place] [address]  Differential     Status: None   Collection Time: 12/02/17  7:54 PM  Result Value Ref Range   Neutrophils Relative % 58 %   Neutro Abs 4.6 1.7 - 7.7 K/uL   Lymphocytes Relative 31 %   Lymphs Abs 2.4 0.7 - 4.0 K/uL   Monocytes Relative 8 %   Monocytes Absolute 0.7 0.1 - 1.0 K/uL   Eosinophils Relative 2 %   Eosinophils Absolute 0.1 0.0 - 0.7 K/uL   Basophils Relative 1 %   Basophils Absolute 0.1 0.0 - 0.1 K/uL    Comment: Performed at [facility], [address] [place], [place] [address]  Comprehensive metabolic panel     Status: Abnormal   Collection Time: 12/02/17  7:54 PM  Result Value Ref Range   Sodium 127 (L) 135 - 145 mmol/L   Potassium 3.9 3.5 - 5.1 mmol/L   Chloride 94 (L) 101 - 111 mmol/L   CO2 22 22 - 32 mmol/L   Glucose, Bld 378 (H) 65 - 99 mg/dL   BUN 12 6 - 20 mg/dL   Creatinine, Ser 0.77 0.61 - 1.24 mg/dL   Calcium 8.9 8.9 - 10.3 mg/dL   Total Protein 6.4 (L) 6.5 - 8.1 g/dL   Albumin 3.9 3.5 - 5.0 g/dL   AST 19 15 - 41 U/L   ALT 23 17 - 63 U/L   Alkaline Phosphatase 76 38 - 126 U/L   Total Bilirubin 0.5 0.3 - 1.2 mg/dL   GFR calc non Af Amer >60 >60 mL/min   GFR calc Af Amer >60 >60 mL/min    Comment: (NOTE) The eGFR has  been calculated using the CKD EPI equation. This calculation has not been validated in all clinical situations. eGFR's persistently <60 mL/min signify possible Chronic Kidney Disease.    Anion gap 11 5 - 15    Comment: Performed at [facility], [address], [place], [place] [address]  I-Stat Chem 8, ED     Status: Abnormal   Collection Time: 12/02/17  8:16 PM  Result Value Ref Range   Sodium 131 (L) 135 - 145 mmol/L   Potassium 4.1 3.5 - 5.1 mmol/L   Chloride 100 (L) 101 -  111 mmol/L   BUN 12 6 - 20 mg/dL   Creatinine, Ser 0.70 0.61 - 1.24 mg/dL   Glucose, Bld 390 (H) 65 - 99 mg/dL   Calcium, Ion 1.13 (L) 1.15 - 1.40 mmol/L   TCO2 24 22 - 32 mmol/L   Hemoglobin 16.3 13.0 - 17.0 g/dL   HCT 48.0 39.0 - 52.0 %  Urine rapid drug screen (hosp performed)     Status: Abnormal   Collection Time: 12/02/17  8:52 PM  Result Value Ref Range   Opiates NONE DETECTED NONE DETECTED   Cocaine NONE DETECTED NONE DETECTED   Benzodiazepines NONE DETECTED NONE DETECTED   Amphetamines NONE DETECTED NONE DETECTED   Tetrahydrocannabinol NONE DETECTED NONE DETECTED   Barbiturates (A) NONE DETECTED    Result not available. Reagent lot number recalled by manufacturer.    Comment: (NOTE) DRUG SCREEN FOR MEDICAL PURPOSES ONLY.  IF CONFIRMATION IS NEEDED FOR ANY PURPOSE, NOTIFY LAB WITHIN 5 DAYS. LOWEST DETECTABLE LIMITS FOR URINE DRUG SCREEN Drug Class                     Cutoff (ng/mL) Amphetamine and metabolites    1000 Barbiturate and metabolites    200 Benzodiazepine                 841 Tricyclics and metabolites     300 Opiates and metabolites        300 Cocaine and metabolites        300 THC                            50 Performed at [facility], [address], [place], [place] [address]   Urinalysis, Routine w reflex microscopic     Status: Abnormal   Collection Time: 12/02/17  8:54 PM  Result Value Ref Range   Color, Urine YELLOW YELLOW   APPearance CLEAR CLEAR   Specific Gravity, Urine 1.028 1.005 - 1.030   pH 5.0 5.0 - 8.0   Glucose, UA >=500 (A) NEGATIVE mg/dL   Hgb urine dipstick NEGATIVE NEGATIVE   Bilirubin Urine NEGATIVE NEGATIVE   Ketones, ur NEGATIVE NEGATIVE mg/dL   Protein, ur 100 (A) NEGATIVE mg/dL   Nitrite NEGATIVE NEGATIVE   Leukocytes, UA NEGATIVE NEGATIVE   RBC / HPF 0-5 0 - 5 RBC/hpf   WBC, UA 0-5 0 - 5 WBC/hpf   Bacteria, UA NONE SEEN NONE SEEN    Comment: Performed at [facility], [address] [place], [place] [address]  CBG  monitoring, ED     Status: Abnormal   Collection Time: 12/02/17 11:59 PM  Result Value Ref Range   Glucose-Capillary 275 (H) 65 - 99 mg/dL  CBG monitoring, ED     Status: Abnormal   Collection Time: 12/03/17  3:56 AM  Result Value Ref Range   Glucose-Capillary 216 (H)  65 - 99 mg/dL  Lipid panel     Status: Abnormal   Collection Time: 12/03/17  6:01 AM  Result Value Ref Range   Cholesterol 279 (H) 0 - 200 mg/dL   Triglycerides 1,256 (H) <150 mg/dL    Comment: RESULTS CONFIRMED BY MANUAL DILUTION   HDL NOT REPORTED DUE TO HIGH TRIGLYCERIDES >40 mg/dL   Total CHOL/HDL Ratio NOT REPORTED DUE TO HIGH TRIGLYCERIDES RATIO   VLDL UNABLE TO CALCULATE IF TRIGLYCERIDE OVER 400 mg/dL 0 - 40 mg/dL   LDL Cholesterol UNABLE TO CALCULATE IF TRIGLYCERIDE OVER 400 mg/dL 0 - 99 mg/dL    Comment:        Total Cholesterol/HDL:CHD Risk Coronary Heart Disease Risk Table                     Men   Women  1/2 Average Risk   3.4   3.3  Average Risk       5.0   4.4  2 X Average Risk   9.6   7.1  3 X Average Risk  23.4   11.0        Use the calculated Patient Ratio above and the CHD Risk Table to determine the patient's CHD Risk.        ATP III CLASSIFICATION (LDL):  <100     mg/dL   Optimal  100-129  mg/dL   Near or Above                    Optimal  130-159  mg/dL   Borderline  160-189  mg/dL   High  >190     mg/dL   Very High Performed at [address] [place], [place] [address]     Studies/Results:     Bryanah Sidell A. Merlene Laughter, M.D.  Diplomate, Tax adviser of Psychiatry and Neurology ( Neurology). 12/03/2017, 8:11 AM

## 2017-12-03 NOTE — ED Notes (Signed)
Pt states that he was attempting to stand due to his back hurting to change positions, Latonya NT was in room with pt but had not gotten to bedside yet when pt stood up, felt his feet slipping causing him to reach for the wall and fall, pt states that he hit his face on the computer at bedside and caught himself with his left hand, pt denies any LOC, Dr Sherryll BurgerShah notified, pt assisted to bed, expressed understanding of the need for assistance, Dr. Sherryll BurgerShah came to bedside for examination,

## 2017-12-03 NOTE — Progress Notes (Signed)
Call placed to on call MD per patient and family's request for anxiety and pain meds,patient at [facility] at present.Given fioracet prior to departure.

## 2017-12-03 NOTE — Plan of Care (Signed)
  Problem: Acute Rehab PT Goals(only PT should resolve) Goal: Pt Will Go Supine/Side To Sit Outcome: Progressing Flowsheets (Taken 12/03/2017 1556) Pt will go Supine/Side to Sit: Independently Goal: Patient Will Transfer Sit To/From Stand Outcome: Progressing Flowsheets (Taken 12/03/2017 1556) Patient will transfer sit to/from stand: with minimal assist Goal: Pt Will Transfer Bed To Chair/Chair To Bed Outcome: Progressing Flowsheets (Taken 12/03/2017 1556) Pt will Transfer Bed to Chair/Chair to Bed: with min assist Goal: Pt Will Ambulate Outcome: Progressing Flowsheets (Taken 12/03/2017 1556) Pt will Ambulate: with moderate assist;25 feet;with rolling walker    3:57 PM, 12/03/17 Ocie BobJames Scarlet Abad, MPT Physical Therapist with [facility] [phone] office [phone] mobile phone

## 2017-12-03 NOTE — Evaluation (Signed)
Physical Therapy Evaluation Patient Details Name: Zachary Deleon MRN: [id number] DOB: January 25, 1966 Today's Date: 12/03/2017   History of Present Illness  Zachary Deleon  is a 52 y.o. male, with history of diabetes mellitus, tobacco abuse, history of DVT in the past, hypertension who has been noncompliant with his medications and has not taken any medication for past 6 months came to hospital with left arm numbness for past 3 days.  As per patient's wife he was having difficulty walking downstairs case and with difficulty using his left hand to grab onto rolling as he descended the stairs.  Patient also had difficulty with speech.  Speech was slurred.    Clinical Impression  Patient limited for taking steps due to left side weakness with difficulty gripping RW using left hand, poor coordination of LLE and frequent loss of balance.  Patient will benefit from continued physical therapy in hospital and recommended venue below to increase strength, balance, endurance for safe ADLs and gait.    Follow Up Recommendations SNF    Equipment Recommendations  Rolling walker with 5" wheels    Recommendations for Other Services       Precautions / Restrictions Precautions Precautions: Fall Restrictions Weight Bearing Restrictions: No      Mobility  Bed Mobility Overal bed mobility: Modified Independent             General bed mobility comments: increased time  Transfers Overall transfer level: Needs assistance Equipment used: Rolling walker (2 wheeled) Transfers: Sit to/from Stand Sit to Stand: Mod assist            Ambulation/Gait Ambulation/Gait assistance: Max assist;Mod assist Gait Distance (Feet): 4 Feet Assistive device: Rolling walker (2 wheeled) Gait Pattern/deviations: Decreased step length - left;Decreased stance time - left;Decreased stride length Gait velocity: slow   General Gait Details: limited to 4-5 side steps, 2-3 steps forward due to left side weakness with  buckling and poor coordination, left hand slipping off walker due to weakness  Stairs            Wheelchair Mobility    Modified Rankin (Stroke Patients Only)       Balance Overall balance assessment: Needs assistance Sitting-balance support: Feet supported;No upper extremity supported Sitting balance-Leahy Scale: Good     Standing balance support: Bilateral upper extremity supported;During functional activity Standing balance-Leahy Scale: Poor Standing balance comment: fair/poor with RW                             Pertinent Vitals/Pain Pain Assessment: 0-10 Pain Score: 5  Pain Location: low back Pain Descriptors / Indicators: Aching Pain Intervention(s): Limited activity within patient's tolerance;Monitored during session    Home Living Family/patient expects to be discharged to:: Private residence Living Arrangements: Spouse/significant other Available Help at Discharge: Family Type of Home: House Home Access: Stairs to enter Entrance Stairs-Rails: None Secretary/administratorntrance Stairs-Number of Steps: 3 Home Layout: One level;Multi-level Home Equipment: Cane - single point      Prior Function Level of Independence: Independent with assistive device(s)         Comments: household and short distanced ambulator with SPC PRN     Hand Dominance        Extremity/Trunk Assessment   Upper Extremity Assessment Upper Extremity Assessment: Defer to OT evaluation    Lower Extremity Assessment Lower Extremity Assessment: Overall WFL for tasks assessed;LLE deficits/detail LLE Deficits / Details: grossly -3/5 LLE Sensation: decreased proprioception LLE Coordination: decreased gross  motor    Cervical / Trunk Assessment Cervical / Trunk Assessment: Normal  Communication   Communication: No difficulties  Cognition Arousal/Alertness: Awake/alert Behavior During Therapy: WFL for tasks assessed/performed Overall Cognitive Status: Within Functional Limits for  tasks assessed                                        General Comments      Exercises     Assessment/Plan    PT Assessment Patient needs continued PT services  PT Problem List Decreased strength;Decreased activity tolerance;Decreased balance;Decreased mobility       PT Treatment Interventions Gait training;Stair training;Functional mobility training;Therapeutic activities;Therapeutic exercise;Patient/family education;Neuromuscular re-education    PT Goals (Current goals can be found in the Care Plan section)  Acute Rehab PT Goals Patient Stated Goal: return home after rehab PT Goal Formulation: With patient/family Time For Goal Achievement: 12/17/17 Potential to Achieve Goals: Good    Frequency 7X/week   Barriers to discharge        Co-evaluation               AM-PAC PT "6 Clicks" Daily Activity  Outcome Measure Difficulty turning over in bed (including adjusting bedclothes, sheets and blankets)?: None Difficulty moving from lying on back to sitting on the side of the bed? : None Difficulty sitting down on and standing up from a chair with arms (e.g., wheelchair, bedside commode, etc,.)?: A Lot Help needed moving to and from a bed to chair (including a wheelchair)?: A Lot Help needed walking in hospital room?: A Lot Help needed climbing 3-5 steps with a railing? : Total 6 Click Score: 15    End of Session   Activity Tolerance: Patient limited by fatigue;Patient tolerated treatment well Patient left: in bed;with call bell/phone within reach;with family/visitor present Nurse Communication: Mobility status PT Visit Diagnosis: Unsteadiness on feet (R26.81);Other abnormalities of gait and mobility (R26.89);Muscle weakness (generalized) (M62.81)    Time: 4098-1191 PT Time Calculation (min) (ACUTE ONLY): 28 min   Charges:   PT Evaluation $PT Eval Moderate Complexity: 1 Mod PT Treatments $Therapeutic Activity: 23-37 mins   PT G Codes:         3:55 PM, Dec 30, 2017 Ocie Bob, MPT Physical Therapist with [facility] [phone] office [phone] mobile phone

## 2017-12-03 NOTE — Progress Notes (Signed)
*  PRELIMINARY RESULTS* Echocardiogram 2D Echocardiogram has been performed.  Zachary Deleon, Zachary Deleon 12/03/2017, 2:26 PM

## 2017-12-03 NOTE — Progress Notes (Signed)
PROGRESS NOTE    Denson Niccoli Trumbull  ZOX:[id number] DOB: 1965/09/30 DOA: 12/02/2017 PCP: Nelma Rothman, NP    Brief Narrative:  52 year old with past medical history relevant for hyperlipidemia, hypertriglyceridemia, type 2 diabetes on insulin (poorly compliant), severe peripheral vascular disease, hypertension, COPD, gated by active tobacco abuse, history of DVT on apixaban noncompliant who was admitted with left-sided symptoms including gait difficulty, dysarthria, numbness and was found to have right posterior MCA subacute infarct as well as occlusion of the right ICA and stenosis of the right MCA.   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  He is diffusely weak on the left side with decreased sensation.  He also has some dysarthria. -PT/OT -Speech line which pathology - Continue atorvastatin 20 mg, will increase to 80 - Continue aspirin 325 mg daily -Neurology consult -Carotid ultrasound shows occlusion of right carotid and greater than 70% stenosis on left, will need referral to outpatient vascular surgery (follows up with vascular surgery at [facility], Dr. Isabell Jarvis) - Hold antihypertensives for 24 hours -Lipid panel grossly abnormal with severe hyper triglyceridemia -Hemoglobin A1c was greater than 11  #) Hypertension/hyperlipidemia/hyperglyceridemia: Permissive hypertension -Increase atorvastatin to 80 mg daily - Hold amlodipine 10 mg -Hold lisinopril-HCTZ 20-12.5 mg daily -Continue gemfibrozil 600 mg twice daily  #) Peripheral vascular disease: No records here however patient apparently is followed at [facility].  Per discussion with the wife patient is not considered a surgical candidate due to his noncompliance and medical comorbidities. -Continue statin - Hold cilostazol 100 mg  twice daily, will restart after 24 hours  #) COPD, gated by active tobacco abuse: -Counseling provided -Continue L AMA, LA BA/ICS -Continue PRN bronchodilators  #) History of DVT: Patient is noncompliant with apixaban -Hold apixaban 5 mg twice daily  #) Type 2 diabetes on insulin: -Continue glargine 50 units twice daily -Sliding scale insulin, AC at bedtime -Hold metformin thousand milligrams twice daily -We will consider adding mealtime insulin as his A1c is grossly elevated  #) Pain/psych: -Continue fluoxetine 20 mg daily -Continue gabapentin 100 mg 3 times daily  #) Class III obesity: Based on exam patient She likely has some degree of OSA and almost certainly has obesity hypoventilation syndrome. -We will need outpatient sleep study  Fluids: Tolerating p.o. Elect lites: Monitor and supplement Nutrition: Carb/heart healthy diet  Prophylaxis: Enoxaparin  Disposition: Pending PT evaluation  DO NOT RESUSCITATE   Consultants:   Neurology  Procedures:   Echo 12/03/2017: Pending  Antimicrobials:   None   Subjective: Patient reports he is in a great deal of pain in his left shoulder.  He apparently had a fall due to his unsteadiness in the emergency department because he got up without telling anybody.  He otherwise reports diffuse numbness and tingling in the left side weakness.  He denies any nausea, vomiting, diarrhea.  He has not had any chest pain.  Objective: Vitals:   12/03/17 0813 12/03/17 0944 12/03/17 1020 12/03/17 1210  BP: (!) 149/96  (!) 139/92 (!) 145/101  Pulse: 72  82 83  Resp: 20  20 18   Temp: 97.8 F (36.6 C)     TempSrc: Oral     SpO2: 99% 98% 98% 99%  Weight: 116.3 kg (256 lb 6.3 oz)     Height: 6\' 2"  (1.88 m)  Intake/Output Summary (Last 24 hours) at 12/03/2017 1317 Last data filed at 12/03/2017 0251 Gross per 24 hour  Intake 100 ml  Output 300 ml  Net -200 ml   Filed Weights   12/02/17 1941 12/03/17 0813  Weight: 136.1 kg  (300 lb) 116.3 kg (256 lb 6.3 oz)    Examination:  General exam: No acute distress Respiratory system: No increased work of breathing, scattered rhonchi and intermittent wheezes, no crackles Cardiovascular system: Distant heart sounds, regular rate and rhythm, no murmurs Gastrointestinal system: Soft, nondistended, no rebound or guarding, plus bowel sounds Central nervous system: Alert and oriented.  Grossly dysarthric speech, cranial nerves II through XII intact, strength for left extensors and flexors and upper extremity and lower extremity is 3-4 out of 5, diminished sensation, left-sided pronator drift, dysmetria on left Extremities: Symmetric 5 x 5 power. Skin: No rashes on visible skin Psychiatry: Judgement and insight appear poor l. Mood & affect appropriate.     Data Reviewed: I have personally reviewed following labs and imaging studies  CBC: Recent Labs  Lab 12/02/17 1954 12/02/17 2016  WBC 7.9  --   NEUTROABS 4.6  --   HGB 17.3* 16.3  HCT 47.7 48.0  MCV 86.7  --   PLT 139*  --    Basic Metabolic Panel: Recent Labs  Lab 12/02/17 1954 12/02/17 2016  NA 127* 131*  K 3.9 4.1  CL 94* 100*  CO2 22  --   GLUCOSE 378* 390*  BUN 12 12  CREATININE 0.77 0.70  CALCIUM 8.9  --    GFR: Estimated Creatinine Clearance: 144.7 mL/min (by C-G formula based on SCr of 0.7 mg/dL). Liver Function Tests: Recent Labs  Lab 12/02/17 1954  AST 19  ALT 23  ALKPHOS 76  BILITOT 0.5  PROT 6.4*  ALBUMIN 3.9   No results for input(s): LIPASE, AMYLASE in the last 168 hours. No results for input(s): AMMONIA in the last 168 hours. Coagulation Profile: Recent Labs  Lab 12/02/17 1954  INR 0.85   Cardiac Enzymes: No results for input(s): CKTOTAL, CKMB, CKMBINDEX, TROPONINI in the last 168 hours. BNP (last 3 results) No results for input(s): PROBNP in the last 8760 hours. HbA1C: Recent Labs    12/03/17 0601  HGBA1C 11.4*   CBG: Recent Labs  Lab 12/02/17 2359  12/03/17 0356 12/03/17 0850 12/03/17 1125  GLUCAP 275* 216* 284* 292*   Lipid Profile: Recent Labs    12/03/17 0601  CHOL 279*  HDL NOT REPORTED DUE TO HIGH TRIGLYCERIDES  LDLCALC UNABLE TO CALCULATE IF TRIGLYCERIDE OVER 400 mg/dL  TRIG 1,6101,256*  CHOLHDL NOT REPORTED DUE TO HIGH TRIGLYCERIDES   Thyroid Function Tests: No results for input(s): TSH, T4TOTAL, FREET4, T3FREE, THYROIDAB in the last 72 hours. Anemia Panel: No results for input(s): VITAMINB12, FOLATE, FERRITIN, TIBC, IRON, RETICCTPCT in the last 72 hours. Sepsis Labs: No results for input(s): PROCALCITON, LATICACIDVEN in the last 168 hours.  No results found for this or any previous visit (from the past 240 hour(s)).       Radiology Studies: Dg Chest 2 View  Result Date: 12/02/2017 CLINICAL DATA:  Left chest pain anteriorly for 1 week.  Smoker. EXAM: CHEST - 2 VIEW COMPARISON:  None. FINDINGS: Normal sized heart. Clear lungs. Mildly prominent interstitial markings. Normal vascularity. No pleural fluid. Mild lower thoracic spine degenerative changes. IMPRESSION: No acute abnormality. Mild chronic interstitial lung disease compatible with the history of smoking. Electronically Signed   By: Zada FindersSteven  Reid M.D.  On: 12/02/2017 23:50   Ct Head Wo Contrast  Result Date: 12/02/2017 CLINICAL DATA:  Altered mental status. Slurred speech. Left upper extremity numbness. EXAM: CT HEAD WITHOUT CONTRAST TECHNIQUE: Contiguous axial images were obtained from the base of the skull through the vertex without intravenous contrast. COMPARISON:  None. FINDINGS: Brain: Postsurgical changes are seen involving the right cerebellar hemisphere. Mild chronic ischemic white matter disease is noted. No mass effect or midline shift is noted. Ventricular size is within normal limits. There is no evidence of mass lesion, hemorrhage or acute infarction. Vascular: No hyperdense vessel or unexpected calcification. Skull: Status post right occipital  craniotomy. No acute abnormality seen involving the calvarium. Sinuses/Orbits: No acute finding. Other: None. IMPRESSION: Postsurgical changes seen in the right cerebellar hemisphere. Mild chronic ischemic white matter disease. No acute intracranial abnormality seen. Electronically Signed   By: Lupita Raider, M.D.   On: 12/02/2017 20:48   Mr Laqueta Jean ZO Contrast  Result Date: 12/03/2017 CLINICAL DATA:  Focal neuro deficit. Weakness dizziness. History of craniotomy for tumor resection. EXAM: MRI HEAD WITHOUT AND WITH CONTRAST MRA HEAD WITHOUT CONTRAST TECHNIQUE: Multiplanar, multiecho pulse sequences of the brain and surrounding structures were obtained without and with intravenous contrast. Angiographic images of the head were obtained using MRA technique without contrast. CONTRAST:  20mL MULTIHANCE GADOBENATE DIMEGLUMINE 529 MG/ML IV SOLN COMPARISON:  CT head 12/02/2017 FINDINGS: MRI HEAD FINDINGS Brain: Patchy areas of infarct in the right MCA territory show restricted diffusion and patchy enhancement compatible with subacute infarct. This involves the posterior insula on the right extending into the right frontal and parietal lobe. No associated hemorrhage mass, effect or midline shift. Ventricle size mildly prominent due to atrophy. Occipital craniotomy. No evidence of recurrent tumor. Mild encephalomalacia right cerebellum appears chronic. Vascular: Normal arterial flow voids Skull and upper cervical spine: Occipital craniotomy. No acute skeletal abnormality Sinuses/Orbits: Paranasal sinuses clear.  Bilateral cataract surgery Other: None MRA HEAD FINDINGS Occlusion of the right internal carotid artery through the cavernous segment with reconstitution of the supraclinoid internal carotid artery on the right. Right middle cerebral artery patent with moderate stenosis at the right MCA bifurcation extending into the M2 branches. Right anterior cerebral artery widely patent Left internal carotid artery widely  patent. Moderate to severe stenosis origin of left A1 segment. Otherwise both anterior middle cerebral arteries are patent without stenosis. Both vertebral arteries patent to the basilar. Basilar widely patent. Superior cerebellar and posterior cerebral arteries patent bilaterally. IMPRESSION: Patchy areas of subacute infarct in the right posterior MCA territory. These show restricted diffusion and enhancement. 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 Moderate stenosis right MCA bifurcation extending into the M2 branches. Severe stenosis left A1 segment. These results were called by telephone at the time of interpretation on 12/03/2017 at 8:11 am to Dr. Arlyn Leak , who verbally acknowledged these results. Electronically Signed   By: Marlan Palau M.D.   On: 12/03/2017 08:12   US Carotid Bilateral (at [facility] Only)  Result Date: 12/03/2017 CLINICAL DATA:  Stroke EXAM: BILATERAL CAROTID DUPLEX ULTRASOUND TECHNIQUE: Wallace Cullens scale imaging, color Doppler and duplex ultrasound were performed of bilateral carotid and vertebral arteries in the neck. COMPARISON:  None. FINDINGS: Criteria: Quantification of carotid stenosis is based on velocity parameters that correlate the residual internal carotid diameter with NASCET-based stenosis levels, using the diameter of the distal internal carotid lumen as the denominator for stenosis measurement. The following velocity measurements were obtained: RIGHT ICA:  Occluded cm/sec CCA:  52 cm/sec SYSTOLIC ICA/CCA RATIO:  Occluded DIASTOLIC ICA/CCA RATIO:  Occluded ECA:  179 cm/sec LEFT ICA:  405 cm/sec CCA:  78 cm/sec SYSTOLIC ICA/CCA RATIO:  5.2 DIASTOLIC ICA/CCA RATIO: ECA:  161 cm/sec RIGHT CAROTID ARTERY: The internal carotid is occluded just above the bulb. There is significant irregular plaque at the origin of the external carotid artery. RIGHT VERTEBRAL ARTERY:  Antegrade. LEFT CAROTID ARTERY: There is mild smooth soft plaque in the  lower common carotid artery. There is extensive predominately soft plaque in the bulb. Low resistance internal carotid Doppler pattern is preserved with a markedly elevated velocity. LEFT VERTEBRAL ARTERY:  Antegrade. 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 Vertebral arteries are antegrade in flow. Electronically Signed   By: Jolaine Click M.D.   On: 12/03/2017 12:30   Mr Maxine Glenn Head/brain WR Cm  Result Date: 12/03/2017 CLINICAL DATA:  Focal neuro deficit. Weakness dizziness. History of craniotomy for tumor resection. EXAM: MRI HEAD WITHOUT AND WITH CONTRAST MRA HEAD WITHOUT CONTRAST TECHNIQUE: Multiplanar, multiecho pulse sequences of the brain and surrounding structures were obtained without and with intravenous contrast. Angiographic images of the head were obtained using MRA technique without contrast. CONTRAST:  20mL MULTIHANCE GADOBENATE DIMEGLUMINE 529 MG/ML IV SOLN COMPARISON:  CT head 12/02/2017 FINDINGS: MRI HEAD FINDINGS Brain: Patchy areas of infarct in the right MCA territory show restricted diffusion and patchy enhancement compatible with subacute infarct. This involves the posterior insula on the right extending into the right frontal and parietal lobe. No associated hemorrhage mass, effect or midline shift. Ventricle size mildly prominent due to atrophy. Occipital craniotomy. No evidence of recurrent tumor. Mild encephalomalacia right cerebellum appears chronic. Vascular: Normal arterial flow voids Skull and upper cervical spine: Occipital craniotomy. No acute skeletal abnormality Sinuses/Orbits: Paranasal sinuses clear.  Bilateral cataract surgery Other: None MRA HEAD FINDINGS Occlusion of the right internal carotid artery through the cavernous segment with reconstitution of the supraclinoid internal carotid artery on the right. Right middle cerebral artery patent with moderate stenosis at the right MCA bifurcation extending  into the M2 branches. Right anterior cerebral artery widely patent Left internal carotid artery widely patent. Moderate to severe stenosis origin of left A1 segment. Otherwise both anterior middle cerebral arteries are patent without stenosis. Both vertebral arteries patent to the basilar. Basilar widely patent. Superior cerebellar and posterior cerebral arteries patent bilaterally. IMPRESSION: Patchy areas of subacute infarct in the right posterior MCA territory. These show restricted diffusion and enhancement. 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 Moderate stenosis right MCA bifurcation extending into the M2 branches. Severe stenosis left A1 segment. These results were called by telephone at the time of interpretation on 12/03/2017 at 8:11 am to Dr. Arlyn Leak , who verbally acknowledged these results. Electronically Signed   By: Marlan Palau M.D.   On: 12/03/2017 08:12        Scheduled Meds: .  stroke: mapping our early stages of recovery book   Does not apply Once  . aspirin  300 mg Rectal Daily   Or  . aspirin  325 mg Oral Daily  . atorvastatin  20 mg Oral Daily  . enoxaparin (LOVENOX) injection  70 mg Subcutaneous Q24H  . FLUoxetine  20 mg Oral Daily  . gabapentin  100 mg Oral TID  . gemfibrozil  600 mg Oral BID AC  . insulin aspart  0-9 Units Subcutaneous Q6H  .  insulin glargine  25 Units Subcutaneous BID  . mometasone-formoterol  2 puff Inhalation BID  . nicotine  21 mg Transdermal Once   Continuous Infusions: . sodium chloride 10 mL/hr at 12/03/17 0032     LOS: 1 day    Time spent: 35    Delaine Lame, MD [facility]   If 7PM-7AM, please contact night-coverage [email or web address] Password [email or web address] 12/03/2017, 1:17 PM

## 2017-12-04 ENCOUNTER — Inpatient Hospital Stay (HOSPITAL_COMMUNITY): Payer: Medicare Other

## 2017-12-04 DIAGNOSIS — I639 Cerebral infarction, unspecified: Secondary | ICD-10-CM

## 2017-12-04 DIAGNOSIS — E1165 Type 2 diabetes mellitus with hyperglycemia: Secondary | ICD-10-CM

## 2017-12-04 LAB — BASIC METABOLIC PANEL WITH GFR
CO2: 24 mmol/L (ref 22–32)
Calcium: 8.3 mg/dL — ABNORMAL LOW (ref 8.9–10.3)
Chloride: 92 mmol/L — ABNORMAL LOW (ref 98–111)
Creatinine, Ser: 0.73 mg/dL (ref 0.61–1.24)

## 2017-12-04 LAB — CBC
HCT: 41.7 % (ref 39.0–52.0)
Hemoglobin: 14.6 g/dL (ref 13.0–17.0)
MCH: 30.2 pg (ref 26.0–34.0)
MCHC: 35 g/dL (ref 30.0–36.0)
MCV: 86.3 fL (ref 78.0–100.0)
Platelets: 112 10*3/uL — ABNORMAL LOW (ref 150–400)
RBC: 4.83 MIL/uL (ref 4.22–5.81)
RDW: 13.4 % (ref 11.5–15.5)
WBC: 10.6 10*3/uL — ABNORMAL HIGH (ref 4.0–10.5)

## 2017-12-04 LAB — GLUCOSE, CAPILLARY
GLUCOSE-CAPILLARY: 263 mg/dL — AB (ref 70–99)
GLUCOSE-CAPILLARY: 234 mg/dL — AB (ref 70–99)
GLUCOSE-CAPILLARY: 200 mg/dL — AB (ref 70–99)
Glucose-Capillary: 161 mg/dL — ABNORMAL HIGH (ref 70–99)
Glucose-Capillary: 188 mg/dL — ABNORMAL HIGH (ref 70–99)

## 2017-12-04 LAB — BASIC METABOLIC PANEL
Anion gap: 10 (ref 5–15)
BUN: 9 mg/dL (ref 6–20)
GFR calc Af Amer: >60 mL/min (ref >60)
GFR calc non Af Amer: >60 mL/min (ref >60)
Glucose, Bld: 234 mg/dL — ABNORMAL HIGH (ref 70–99)
Potassium: 3.8 mmol/L (ref 3.5–5.1)
Sodium: 126 mmol/L — ABNORMAL LOW (ref 135–145)

## 2017-12-04 LAB — HIV ANTIBODY (ROUTINE TESTING W REFLEX): HIV Screen 4th Generation wRfx: NONREACTIVE

## 2017-12-04 LAB — MAGNESIUM: Magnesium: 1.5 mg/dL — ABNORMAL LOW (ref 1.7–2.4)

## 2017-12-04 MED ORDER — NICOTINE 21 MG/24HR TD PT24
21.0000 mg | MEDICATED_PATCH | Freq: Every day | TRANSDERMAL | Status: DC
  Administered 2017-12-04 – 2017-12-06 (×3): 21 mg via TRANSDERMAL
  Filled 2017-12-04 (×3): qty 1

## 2017-12-04 MED ORDER — ATORVASTATIN CALCIUM 40 MG PO TABS
80.0000 mg | ORAL_TABLET | Freq: Every day | ORAL | Status: DC
  Administered 2017-12-05 – 2017-12-06 (×2): 80 mg via ORAL
  Filled 2017-12-04 (×2): qty 2

## 2017-12-04 MED ORDER — OXYCODONE-ACETAMINOPHEN 5-325 MG PO TABS
1.0000 | ORAL_TABLET | Freq: Once | ORAL | Status: AC
  Administered 2017-12-04: 1 via ORAL
  Filled 2017-12-04: qty 1

## 2017-12-04 MED ORDER — LORAZEPAM 2 MG/ML IJ SOLN
2.0000 mg | Freq: Once | INTRAMUSCULAR | Status: AC
  Administered 2017-12-04: 2 mg via INTRAVENOUS
  Filled 2017-12-04: qty 1

## 2017-12-04 MED ORDER — QUETIAPINE FUMARATE 25 MG PO TABS
25.0000 mg | ORAL_TABLET | Freq: Two times a day (BID) | ORAL | Status: DC
  Administered 2017-12-04: 25 mg via ORAL
  Filled 2017-12-04: qty 1

## 2017-12-04 MED ORDER — MAGNESIUM SULFATE 2 GM/50ML IV SOLN
2.0000 g | Freq: Once | INTRAVENOUS | Status: AC
Start: 2017-12-04 — End: 2017-12-04
  Administered 2017-12-04: 2 g via INTRAVENOUS
  Filled 2017-12-04: qty 50

## 2017-12-04 MED ORDER — INSULIN ASPART 100 UNIT/ML ~~LOC~~ SOLN
6.0000 [IU] | Freq: Three times a day (TID) | SUBCUTANEOUS | Status: DC
  Administered 2017-12-05 (×2): 6 [IU] via SUBCUTANEOUS

## 2017-12-04 MED ORDER — LORAZEPAM 2 MG/ML IJ SOLN
1.0000 mg | INTRAMUSCULAR | Status: DC | PRN
  Administered 2017-12-04 – 2017-12-06 (×5): 1 mg via INTRAVENOUS
  Filled 2017-12-04 (×5): qty 1

## 2017-12-04 MED ORDER — LORAZEPAM 1 MG PO TABS
1.0000 mg | ORAL_TABLET | Freq: Once | ORAL | Status: AC
  Administered 2017-12-04: 1 mg via ORAL
  Filled 2017-12-04: qty 1

## 2017-12-04 MED ORDER — INSULIN ASPART 100 UNIT/ML ~~LOC~~ SOLN
0.0000 [IU] | Freq: Three times a day (TID) | SUBCUTANEOUS | Status: DC
  Administered 2017-12-04: 5 [IU] via SUBCUTANEOUS
  Administered 2017-12-04 – 2017-12-05 (×2): 3 [IU] via SUBCUTANEOUS
  Administered 2017-12-05: 5 [IU] via SUBCUTANEOUS
  Administered 2017-12-06 (×2): 3 [IU] via SUBCUTANEOUS
  Administered 2017-12-06: 8 [IU] via SUBCUTANEOUS

## 2017-12-04 MED ORDER — QUETIAPINE FUMARATE 25 MG PO TABS
25.0000 mg | ORAL_TABLET | Freq: Two times a day (BID) | ORAL | Status: DC | PRN
  Administered 2017-12-05 – 2017-12-06 (×2): 25 mg via ORAL
  Filled 2017-12-04 (×2): qty 1

## 2017-12-04 MED ORDER — LIVING WELL WITH DIABETES BOOK
Freq: Once | Status: AC
  Administered 2017-12-04: 09:00:00
  Filled 2017-12-04: qty 1

## 2017-12-04 MED ORDER — INSULIN GLARGINE 100 UNIT/ML ~~LOC~~ SOLN
40.0000 [IU] | Freq: Every day | SUBCUTANEOUS | Status: DC
  Administered 2017-12-04: 40 [IU] via SUBCUTANEOUS
  Filled 2017-12-04 (×2): qty 0.4

## 2017-12-04 MED ORDER — INSULIN ASPART 100 UNIT/ML ~~LOC~~ SOLN
0.0000 [IU] | Freq: Every day | SUBCUTANEOUS | Status: DC
  Administered 2017-12-05: 3 [IU] via SUBCUTANEOUS
  Administered 2017-12-06: 2 [IU] via SUBCUTANEOUS

## 2017-12-04 MED ORDER — CLOPIDOGREL BISULFATE 75 MG PO TABS
75.0000 mg | ORAL_TABLET | Freq: Every day | ORAL | Status: DC
  Administered 2017-12-05 – 2017-12-07 (×3): 75 mg via ORAL
  Filled 2017-12-04 (×3): qty 1

## 2017-12-04 MED ORDER — QUETIAPINE FUMARATE 25 MG PO TABS: 25.0000 mg | ORAL_TABLET | Freq: Once | ORAL | Status: DC

## 2017-12-04 NOTE — Progress Notes (Signed)
Received return call from Dr. Sherryll BurgerShah regarding patients' wife request foe MD to come and see patient.Granted,Informed the same that carenurse is unable to give a time when MD will arrive"stuck in ED"

## 2017-12-04 NOTE — Progress Notes (Signed)
OT Cancellation Note  Patient Details Name: Lucianne Leilton J Aiello MRN: [id number] DOB: [birth year]   Cancelled Treatment:    Reason Eval/Treat Not Completed: Patient's level of consciousness. Pt sleeping soundly on OT arrival, daughter reporting pt had rough night, finally falling asleep around 5:30am. Per chart review, pt received Ativan overnight for agitation and daughter reporting pt has not awakened this am for them or for labs, etc. Will attempt evaluation later in the day when pt able to participate in evaluation.  Ezra SitesLeslie Denice Cardon, OTR/L  [phone] 12/04/2017, 8:25 AM

## 2017-12-04 NOTE — Progress Notes (Signed)
ATIVAN 2 mg given as ordered,continues to c/o of pain,will continue to monitor.Family at bedside.

## 2017-12-04 NOTE — Clinical Social Work Note (Signed)
Clinical Social Work Assessment  Patient Details  Name: Zachary Deleon MRN: [id number] Date of Birth: [birth year]  Date of referral:  12/04/17               Reason for consult:  Facility Placement                Permission sought to share information with:    Permission granted to share information::     Name::        Agency::     Relationship::     Contact Information:  spouse, Mrs. Zachary Deleon was at bedside   Housing/Transportation Living arrangements for the past 2 months:  Single Family Home Source of Information:  Spouse Patient Interpreter Needed:  None Criminal Activity/Legal Involvement Pertinent to Current Situation/Hospitalization:  No - Comment as needed Significant Relationships:  Adult Children, Spouse Lives with:  Spouse, Adult Children Do you feel safe going back to the place where you live?  Yes Need for family participation in patient care:  Yes (Comment)  Care giving concerns:  None identified at baseline.    Social Worker assessment / plan:  Patient ambulates with a cane, drives and is independent at baseline.  [facility]   Employment status:  Disabled (Comment on whether or not currently receiving Disability) Insurance information:  Medicare PT Recommendations:  Skilled Nursing Facility Information / Referral to community resources:  Skilled Nursing Facility  Patient/Family's Response to care:  Patient is agreeable to short term rehab.   Patient/Family's Understanding of and Emotional Response to Diagnosis, Current Treatment, and Prognosis:  Patient/family understand patient's diagnosis, treatment and prognosis and are agreeable to rehab.   Emotional Assessment Appearance:  Appears stated age Attitude/Demeanor/Rapport:    Affect (typically observed):  Accepting, Calm Orientation:  Oriented to Self, Oriented to Place, Oriented to Situation Alcohol / Substance use:  Not Applicable Psych involvement (Current and /or in  the community):  No (Comment)  Discharge Needs  Concerns to be addressed:  Discharge Planning Concerns Readmission within the last 30 days:  No Current discharge risk:  None Barriers to Discharge:  No Barriers Identified   Annice NeedySettle, Aramis Weil D, LCSW 12/04/2017, 5:03 PM

## 2017-12-04 NOTE — Progress Notes (Signed)
Received order for ativan 2mg  IV stat,carenurse to carry out without delay.Family at bedside continues to display jerky;type movements with intermitttent back spasms.

## 2017-12-04 NOTE — NC FL2 (Signed)
[place] MEDICAID FL2 LEVEL OF CARE SCREENING TOOL     IDENTIFICATION  Patient Name: Zachary Deleon Birthdate: 02-Oct-1965 Sex: male Admission Date (Current Location): 12/02/2017  [facility] and [place] Number:  [employer] and Address:  [facility],  [address], [place] [address]      Provider Number: [id number]  Attending Physician Name and Address:  Cleora Fleet, MD  Relative Name and Phone Number:       Current Level of Care: Hospital Recommended Level of Care: Skilled Nursing Facility Prior Approval Number:    Date Approved/Denied:   PASRR Number: [id number])  Discharge Plan: SNF    Current Diagnoses: Patient Active Problem List   Diagnosis Date Noted  . Stroke (HCC) 12/02/2017  . Type 2 diabetes mellitus without complication (HCC) 12/02/2017    Orientation RESPIRATION BLADDER Height & Weight     Self, Situation, Place  Normal Continent Weight: 256 lb 6.3 oz (116.3 kg) Height:  6\' 2"  (188 cm)  BEHAVIORAL SYMPTOMS/MOOD NEUROLOGICAL BOWEL NUTRITION STATUS      Continent Diet(renal/carb modified with fluid restriction fluid)  AMBULATORY STATUS COMMUNICATION OF NEEDS Skin   Extensive Assist Verbally Normal                       Personal Care Assistance Level of Assistance  Bathing, Feeding, Dressing Bathing Assistance: Limited assistance Feeding assistance: Independent Dressing Assistance: Limited assistance     Functional Limitations Info  Sight, Hearing, Speech Sight Info: Adequate Hearing Info: Adequate Speech Info: Adequate    SPECIAL CARE FACTORS FREQUENCY  PT (By licensed PT)     PT Frequency: 5x/week              Contractures Contractures Info: Not present    Additional Factors Info  Code Status, Allergies, Psychotropic Code Status Info: DNR Allergies Info: Morphine and related Psychotropic Info: Prozac         Current Medications (12/04/2017):  This is the current  hospital active medication list Current Facility-Administered Medications  Medication Dose Route Frequency Provider Last Rate Last Dose  .  stroke: mapping our early stages of recovery book   Does not apply Once [place], Gagan S, MD      . 0.9 %  sodium chloride infusion   Intravenous Continuous Meredeth Ide, MD   Stopped at 12/03/17 1500  . acetaminophen (TYLENOL) tablet 650 mg  650 mg Oral Q4H PRN Meredeth Ide, MD   650 mg at 12/03/17 4696   Or  . acetaminophen (TYLENOL) solution 650 mg  650 mg Per Tube Q4H PRN Meredeth Ide, MD       Or  . acetaminophen (TYLENOL) suppository 650 mg  650 mg Rectal Q4H PRN Meredeth Ide, MD      . aspirin suppository 300 mg  300 mg Rectal Daily Sharl Ma, Sarina Ill, MD       Or  . aspirin tablet 325 mg  325 mg Oral Daily Meredeth Ide, MD   325 mg at 12/04/17 1024  . [START ON 12/05/2017] atorvastatin (LIPITOR) tablet 80 mg  80 mg Oral q1800 Johnson, Clanford L, MD      . butalbital-acetaminophen-caffeine (FIORICET, ESGIC) 50-325-40 MG per tablet 1 tablet  1 tablet Oral Q4H PRN Purohit, Salli Quarry, MD   1 tablet at 12/04/17 1023  . [START ON 12/05/2017] clopidogrel (PLAVIX) tablet 75 mg  75 mg Oral Q breakfast Beryle Beams, MD      .  enoxaparin (LOVENOX) injection 70 mg  70 mg Subcutaneous Q24H Purohit, Shrey C, MD   70 mg at 12/03/17 2144  . fenofibrate tablet 160 mg  160 mg Oral Daily Purohit, Shrey C, MD   160 mg at 12/04/17 1024  . FLUoxetine (PROZAC) capsule 20 mg  20 mg Oral Daily Meredeth IdeLama, Gagan S, MD   20 mg at 12/04/17 1024  . gabapentin (NEURONTIN) capsule 100 mg  100 mg Oral TID Meredeth IdeLama, Gagan S, MD   100 mg at 12/04/17 1000  . insulin aspart (novoLOG) injection 0-15 Units  0-15 Units Subcutaneous TID WC Johnson, Clanford L, MD   5 Units at 12/04/17 1225  . insulin aspart (novoLOG) injection 0-5 Units  0-5 Units Subcutaneous QHS Johnson, Clanford L, MD      . insulin aspart (novoLOG) injection 6 Units  6 Units Subcutaneous TID WC Johnson, Clanford L, MD      .  insulin glargine (LANTUS) injection 40 Units  40 Units Subcutaneous QHS Johnson, Clanford L, MD      . LORazepam (ATIVAN) injection 1 mg  1 mg Intravenous Q4H PRN Johnson, Clanford L, MD   1 mg at 12/04/17 1212  . mometasone-formoterol (DULERA) 200-5 MCG/ACT inhaler 2 puff  2 puff Inhalation BID Meredeth IdeLama, Gagan S, MD   2 puff at 12/03/17 2010  . nicotine (NICODERM CQ - dosed in mg/24 hours) patch 21 mg  21 mg Transdermal Daily Johnson, Clanford L, MD      . QUEtiapine (SEROQUEL) tablet 25 mg  25 mg Oral BID PRN Johnson, Clanford L, MD      . senna-docusate (Senokot-S) tablet 1 tablet  1 tablet Oral QHS PRN Meredeth IdeLama, Gagan S, MD         Discharge Medications: Please see discharge summary for a list of discharge medications.  Relevant Imaging Results:  Relevant Lab Results:   Additional Information SSN [id number] [phone]   Areta Terwilliger, Juleen ChinaHeather D, [place]CS[place]

## 2017-12-04 NOTE — Progress Notes (Signed)
PROGRESS NOTE    Zachary Deleon  WUJ:[id number] DOB: 17-Jun-1965 DOA: 12/02/2017 PCP: Nelma Rothman, NP    Brief Narrative:  52 year old with past medical history relevant for hyperlipidemia, hypertriglyceridemia, type 2 diabetes on insulin (poorly compliant), severe peripheral vascular disease, hypertension, COPD, gated by active tobacco abuse, history of DVT on apixaban noncompliant who was admitted with left-sided symptoms including gait difficulty, dysarthria, numbness and was found to have right posterior MCA subacute infarct as well as occlusion of the right ICA and stenosis of the right MCA.  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  He is diffusely weak on the left side with decreased sensation.  He also has some dysarthria. -PT/OT -Speech line which pathology - Continue atorvastatin 20 mg, will increase to 80 - Continue aspirin 325 mg daily -Neurology consult -Carotid ultrasound shows occlusion of right carotid and greater than 70% stenosis on left, will need referral to outpatient vascular surgery (follows up with vascular surgery at [facility], Dr. Isabell Jarvis) - Hold antihypertensives for 24 hours -Lipid panel grossly abnormal with severe hyper triglyceridemia -Hemoglobin A1c was greater than 11  #) Hypertension/hyperlipidemia/hyperglyceridemia: Permissive hypertension -Increase atorvastatin to 80 mg daily - Hold amlodipine 10 mg -Hold lisinopril-HCTZ 20-12.5 mg daily -Continue gemfibrozil 600 mg twice daily  #) Peripheral vascular disease: No records here however patient apparently is followed at [facility].  Per discussion with the wife patient is not considered a surgical candidate due to his noncompliance and medical comorbidities. -Continue statin - Hold cilostazol 100 mg  twice daily, will restart after 24 hours  #) COPD, gated by active tobacco abuse: -Counseling provided -Continue L AMA, LA BA/ICS -Continue PRN bronchodilators  #) History of DVT: Patient is noncompliant with apixaban -Hold apixaban 5 mg twice daily  #) Type 2 diabetes on insulin: -Continue glargine daily.  -Sliding scale insulin, AC at bedtime -Hold metformin thousand milligrams twice daily -Added mealtime insulin as his A1c is grossly elevated  #) Pain/psych: -Continue fluoxetine 20 mg daily -Continue gabapentin 100 mg 3 times daily -- With increasing agitation and delirium, trial of seroquel 25 mg BID, lorazepam for anxiety and sedation ordered as needed  #) Class III obesity: Based on exam patient She likely has some degree of OSA and almost certainly has obesity hypoventilation syndrome. -We will need outpatient sleep study  Fluids: Tolerating p.o. Elect lites: Monitor and supplement Nutrition: Carb/heart healthy diet  Prophylaxis: Enoxaparin  Disposition: PT recommending SNF placement   DO NOT RESUSCITATE   Consultants:   Neurology  Procedures:   Echo 12/03/2017: Pending  Antimicrobials:   None   Subjective: Increased agitation and refusing to stay in bed  Objective: Vitals:   12/04/17 0006 12/04/17 0302 12/04/17 0503 12/04/17 1000  BP: 133/64 104/81 127/81 119/78  Pulse: 66 75 66 80  Resp: 14 14 14    Temp: 98.4 F (36.9 C) 98.1 F (36.7 C) 98.4 F (36.9 C) 97.6 F (36.4 C)  TempSrc: Oral Oral Oral Oral  SpO2: 92% 94% 97% 97%  Weight:      Height:        Intake/Output Summary (Last 24 hours) at 12/04/2017 1153 Last data filed at 12/03/2017 2100 Gross per 24 hour  Intake 624.67 ml  Output 400 ml  Net 224.67 ml   [employer]  12/02/17 1941 12/03/17 0813  Weight: 136.1 kg (300 lb) 116.3 kg (256 lb 6.3 oz)    Examination:  General exam: Pt becoming increasing agitated and refusing to stay in bed.   Respiratory system: No increased  work of breathing, scattered rhonchi and intermittent wheezes, no crackles Cardiovascular system: Distant heart sounds, regular rate and rhythm, no murmurs Gastrointestinal system: Soft, nondistended, no rebound or guarding, plus bowel sounds Central nervous system: Alert and oriented.  Grossly dysarthric speech, cranial nerves II through XII intact, strength for left extensors and flexors and upper extremity and lower extremity is 3-4 out of 5, diminished sensation, left-sided pronator drift, dysmetria on left Extremities: Symmetric 5 x 5 power. Skin: No rashes on visible skin Psychiatry: Judgement and insight appear poor l. Mood & affect appropriate.   Data Reviewed: I have personally reviewed following labs and imaging studies  CBC: Recent Labs  Lab 12/02/17 1954 12/02/17 2016 12/04/17 0647  WBC 7.9  --  10.6*  NEUTROABS 4.6  --   --   HGB 17.3* 16.3 14.6  HCT 47.7 48.0 41.7  MCV 86.7  --  86.3  PLT 139*  --  112*   Basic Metabolic Panel: Recent Labs  Lab 12/02/17 1954 12/02/17 2016 12/04/17 0647  NA 127* 131* 126*  K 3.9 4.1 3.8  CL 94* 100* 92*  CO2 22  --  24  GLUCOSE 378* 390* 234*  BUN 12 12 9   CREATININE 0.77 0.70 0.73  CALCIUM 8.9  --  8.3*  MG  --   --  1.5*   GFR: Estimated Creatinine Clearance: 144.7 mL/min (by C-G formula based on SCr of 0.73 mg/dL). Liver Function Tests: Recent Labs  Lab 12/02/17 1954  AST 19  ALT 23  ALKPHOS 76  BILITOT 0.5  PROT 6.4*  ALBUMIN 3.9   No results for input(s): LIPASE, AMYLASE in the last 168 hours. No results for input(s): AMMONIA in the last 168 hours. Coagulation Profile: Recent Labs  Lab 12/02/17 1954  INR 0.85   Cardiac Enzymes: No results for input(s): CKTOTAL, CKMB, CKMBINDEX, TROPONINI in the last 168 hours. BNP (last 3 results) No results for input(s): PROBNP in the last 8760 hours. HbA1C: Recent Labs    12/03/17 0601  HGBA1C 11.4*   CBG: Recent Labs  Lab 12/03/17 1125 12/03/17 1808  12/04/17 0003 12/04/17 0549 12/04/17 1145  GLUCAP 292* 225* 161* 263* 234*   Lipid Profile: Recent Labs    12/03/17 0601  CHOL 279*  HDL NOT REPORTED DUE TO HIGH TRIGLYCERIDES  LDLCALC UNABLE TO CALCULATE IF TRIGLYCERIDE OVER 400 mg/dL  TRIG 1,610*  CHOLHDL NOT REPORTED DUE TO HIGH TRIGLYCERIDES   Thyroid Function Tests: No results for input(s): TSH, T4TOTAL, FREET4, T3FREE, THYROIDAB in the last 72 hours. Anemia Panel: No results for input(s): VITAMINB12, FOLATE, FERRITIN, TIBC, IRON, RETICCTPCT in the last 72 hours. Sepsis Labs: No results for input(s): PROCALCITON, LATICACIDVEN in the last 168 hours.  No results found for this or any previous visit (from the past 240 hour(s)).   Radiology Studies: Dg Chest 2 View  Result Date: 12/02/2017 CLINICAL DATA:  Left chest pain anteriorly for 1 week.  Smoker. EXAM: CHEST - 2 VIEW COMPARISON:  None. FINDINGS: Normal sized heart. Clear lungs. Mildly prominent interstitial markings. Normal vascularity. No pleural fluid. Mild lower thoracic spine degenerative changes. IMPRESSION: No acute abnormality. Mild chronic interstitial lung disease compatible with the history of smoking. Electronically Signed   By: Zada Finders.D.  On: 12/02/2017 23:50   Ct Head Wo Contrast  Result Date: 12/04/2017 CLINICAL DATA:  Initial evaluation for increased confusion, unresponsiveness. EXAM: CT HEAD WITHOUT CONTRAST TECHNIQUE: Contiguous axial images were obtained from the base of the skull through the vertex without intravenous contrast. COMPARISON:  Prior CT and MRI from 12/03/2017. FINDINGS: Brain: Scattered areas of evolving right MCA infarction again seen, stable in size and distribution from previous. No evidence for hemorrhagic transformation or significant mass effect. No acute intracranial hemorrhage. No new large vessel territory infarct identified. No mass lesion or midline shift. Stable ventricular size and morphology without hydrocephalus. No  extra-axial fluid collection. Prior right occipital craniotomy with underlying encephalomalacia, stable. Vascular: No hyperdense vessel. Scattered vascular calcifications noted within the carotid siphons. Skull: Scalp soft tissues within normal limits. Prior right occipital craniotomy. Sinuses/Orbits: Globes and orbital soft tissues demonstrate no acute finding. Mild scattered mucosal thickening within the ethmoidal air cells. Paranasal sinuses are otherwise clear. No mastoid effusion. IMPRESSION: 1. Continued interval evolution of patchy right MCA territory infarcts. No evidence for hemorrhagic transformation. 2. No other new acute intracranial abnormality. Electronically Signed   By: Rise Mu M.D.   On: 12/04/2017 05:21   Ct Head Wo Contrast  Result Date: 12/03/2017 CLINICAL DATA:  52 year old male with acute severe headache. Areas of subacute infarct involving the right MCA territory was seen on recent MRI. EXAM: CT HEAD WITHOUT CONTRAST TECHNIQUE: Contiguous axial images were obtained from the base of the skull through the vertex without intravenous contrast. COMPARISON:  Head CT dated 12/02/2017 and MRI dated 12/03/2017 FINDINGS: Brain: There are patchy areas of lower attenuation primarily involving the right parietal lobe white matter consistent with cytotoxic edema. No associated significant mass effect. There is no midline shift. There is no acute intracranial hemorrhage. There is stable mild dilatation of the ventricles. Vascular: No hyperdense vessel or unexpected calcification. Skull: Postsurgical changes of suboccipital craniectomy. No acute calvarial pathology. Sinuses/Orbits: Mild mucoperiosteal thickening of paranasal sinuses. No air-fluid level. The mastoid air cells are clear. Other: None IMPRESSION: 1. No acute intracranial hemorrhage. 2. Development of patchy area of cytotoxic edema in the right parietal lobe corresponding to the right MCA territory infarct. Electronically Signed    By: Elgie Collard M.D.   On: 12/03/2017 21:24   Ct Head Wo Contrast  Result Date: 12/02/2017 CLINICAL DATA:  Altered mental status. Slurred speech. Left upper extremity numbness. EXAM: CT HEAD WITHOUT CONTRAST TECHNIQUE: Contiguous axial images were obtained from the base of the skull through the vertex without intravenous contrast. COMPARISON:  None. FINDINGS: Brain: Postsurgical changes are seen involving the right cerebellar hemisphere. Mild chronic ischemic white matter disease is noted. No mass effect or midline shift is noted. Ventricular size is within normal limits. There is no evidence of mass lesion, hemorrhage or acute infarction. Vascular: No hyperdense vessel or unexpected calcification. Skull: Status post right occipital craniotomy. No acute abnormality seen involving the calvarium. Sinuses/Orbits: No acute finding. Other: None. IMPRESSION: Postsurgical changes seen in the right cerebellar hemisphere. Mild chronic ischemic white matter disease. No acute intracranial abnormality seen. Electronically Signed   By: Lupita Raider, M.D.   On: 12/02/2017 20:48   Mr Laqueta Jean UJ Contrast  Result Date: 12/03/2017 CLINICAL DATA:  Focal neuro deficit. Weakness dizziness. History of craniotomy for tumor resection. EXAM: MRI HEAD WITHOUT AND WITH CONTRAST MRA HEAD WITHOUT CONTRAST TECHNIQUE: Multiplanar, multiecho pulse sequences of the brain and surrounding structures were obtained without and with intravenous contrast. Angiographic images  of the head were obtained using MRA technique without contrast. CONTRAST:  20mL MULTIHANCE GADOBENATE DIMEGLUMINE 529 MG/ML IV SOLN COMPARISON:  CT head 12/02/2017 FINDINGS: MRI HEAD FINDINGS Brain: Patchy areas of infarct in the right MCA territory show restricted diffusion and patchy enhancement compatible with subacute infarct. This involves the posterior insula on the right extending into the right frontal and parietal lobe. No associated hemorrhage mass, effect  or midline shift. Ventricle size mildly prominent due to atrophy. Occipital craniotomy. No evidence of recurrent tumor. Mild encephalomalacia right cerebellum appears chronic. Vascular: Normal arterial flow voids Skull and upper cervical spine: Occipital craniotomy. No acute skeletal abnormality Sinuses/Orbits: Paranasal sinuses clear.  Bilateral cataract surgery Other: None MRA HEAD FINDINGS Occlusion of the right internal carotid artery through the cavernous segment with reconstitution of the supraclinoid internal carotid artery on the right. Right middle cerebral artery patent with moderate stenosis at the right MCA bifurcation extending into the M2 branches. Right anterior cerebral artery widely patent Left internal carotid artery widely patent. Moderate to severe stenosis origin of left A1 segment. Otherwise both anterior middle cerebral arteries are patent without stenosis. Both vertebral arteries patent to the basilar. Basilar widely patent. Superior cerebellar and posterior cerebral arteries patent bilaterally. IMPRESSION: Patchy areas of subacute infarct in the right posterior MCA territory. These show restricted diffusion and enhancement. 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 Moderate stenosis right MCA bifurcation extending into the M2 branches. Severe stenosis left A1 segment. These results were called by telephone at the time of interpretation on 12/03/2017 at 8:11 am to Dr. Arlyn Leak , who verbally acknowledged these results. Electronically Signed   By: Marlan Palau M.D.   On: 12/03/2017 08:12   US Carotid Bilateral (at [facility] And [facility] Only)  Result Date: 12/03/2017 CLINICAL DATA:  Stroke EXAM: BILATERAL CAROTID DUPLEX ULTRASOUND TECHNIQUE: Wallace Cullens scale imaging, color Doppler and duplex ultrasound were performed of bilateral carotid and vertebral arteries in the neck. COMPARISON:  None. FINDINGS: Criteria: Quantification of carotid stenosis is based on  velocity parameters that correlate the residual internal carotid diameter with NASCET-based stenosis levels, using the diameter of the distal internal carotid lumen as the denominator for stenosis measurement. The following velocity measurements were obtained: RIGHT ICA:  Occluded cm/sec CCA:  52 cm/sec SYSTOLIC ICA/CCA RATIO:  Occluded DIASTOLIC ICA/CCA RATIO:  Occluded ECA:  179 cm/sec LEFT ICA:  405 cm/sec CCA:  78 cm/sec SYSTOLIC ICA/CCA RATIO:  5.2 DIASTOLIC ICA/CCA RATIO: ECA:  409 cm/sec RIGHT CAROTID ARTERY: The internal carotid is occluded just above the bulb. There is significant irregular plaque at the origin of the external carotid artery. RIGHT VERTEBRAL ARTERY:  Antegrade. LEFT CAROTID ARTERY: There is mild smooth soft plaque in the lower common carotid artery. There is extensive predominately soft plaque in the bulb. Low resistance internal carotid Doppler pattern is preserved with a markedly elevated velocity. LEFT VERTEBRAL ARTERY:  Antegrade. 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 Vertebral arteries are antegrade in flow. Electronically Signed   By: Jolaine Click M.D.   On: 12/03/2017 12:30   Mr Maxine Glenn Head/brain WJ Cm  Result Date: 12/03/2017 CLINICAL DATA:  Focal neuro deficit. Weakness dizziness. History of craniotomy for tumor resection. EXAM: MRI HEAD WITHOUT AND WITH CONTRAST MRA HEAD WITHOUT CONTRAST TECHNIQUE: Multiplanar, multiecho pulse sequences of the brain and surrounding structures were obtained without and with intravenous contrast. Angiographic images of the head were obtained using  MRA technique without contrast. CONTRAST:  20mL MULTIHANCE GADOBENATE DIMEGLUMINE 529 MG/ML IV SOLN COMPARISON:  CT head 12/02/2017 FINDINGS: MRI HEAD FINDINGS Brain: Patchy areas of infarct in the right MCA territory show restricted diffusion and patchy enhancement compatible with subacute infarct. This involves the posterior  insula on the right extending into the right frontal and parietal lobe. No associated hemorrhage mass, effect or midline shift. Ventricle size mildly prominent due to atrophy. Occipital craniotomy. No evidence of recurrent tumor. Mild encephalomalacia right cerebellum appears chronic. Vascular: Normal arterial flow voids Skull and upper cervical spine: Occipital craniotomy. No acute skeletal abnormality Sinuses/Orbits: Paranasal sinuses clear.  Bilateral cataract surgery Other: None MRA HEAD FINDINGS Occlusion of the right internal carotid artery through the cavernous segment with reconstitution of the supraclinoid internal carotid artery on the right. Right middle cerebral artery patent with moderate stenosis at the right MCA bifurcation extending into the M2 branches. Right anterior cerebral artery widely patent Left internal carotid artery widely patent. Moderate to severe stenosis origin of left A1 segment. Otherwise both anterior middle cerebral arteries are patent without stenosis. Both vertebral arteries patent to the basilar. Basilar widely patent. Superior cerebellar and posterior cerebral arteries patent bilaterally. IMPRESSION: Patchy areas of subacute infarct in the right posterior MCA territory. These show restricted diffusion and enhancement. 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 Moderate stenosis right MCA bifurcation extending into the M2 branches. Severe stenosis left A1 segment. These results were called by telephone at the time of interpretation on 12/03/2017 at 8:11 am to Dr. Arlyn LeakSHREY PUROHIT , who verbally acknowledged these results. Electronically Signed   By: Marlan Palauharles  Clark M.D.   On: 12/03/2017 08:12    Scheduled Meds: .  stroke: mapping our early stages of recovery book   Does not apply Once  . aspirin  300 mg Rectal Daily   Or  . aspirin  325 mg Oral Daily  . [START ON 12/05/2017] atorvastatin  80 mg Oral q1800  . enoxaparin (LOVENOX) injection   70 mg Subcutaneous Q24H  . fenofibrate  160 mg Oral Daily  . FLUoxetine  20 mg Oral Daily  . gabapentin  100 mg Oral TID  . insulin aspart  0-15 Units Subcutaneous TID WC  . insulin aspart  0-5 Units Subcutaneous QHS  . insulin aspart  6 Units Subcutaneous TID WC  . insulin glargine  40 Units Subcutaneous QHS  . living well with diabetes book   Does not apply Once  . mometasone-formoterol  2 puff Inhalation BID  . nicotine  21 mg Transdermal Daily  . QUEtiapine  25 mg Oral BID   Continuous Infusions: . sodium chloride Stopped (12/03/17 1500)     LOS: 2 days   Time spent: 35  Miciah Shealy Laural BenesJohnson, MD [facility]   If 7PM-7AM, please contact night-coverage [email or web address] Password [email or web address] 12/04/2017, 11:53 AM

## 2017-12-04 NOTE — Progress Notes (Signed)
Inpatient Diabetes Program Recommendations  AACE/ADA: New Consensus Statement on Inpatient Glycemic Control (2015)  Target Ranges:  Prepandial:   less than 140 mg/dL      Peak postprandial:   less than 180 mg/dL (1-2 hours)      Critically ill patients:  140 - 180 mg/dL  Results for Zachary Deleon, Zachary Deleon (MRN [id number]) as of 12/04/2017 07:42  Ref. Range 12/03/2017 03:56 12/03/2017 08:50 12/03/2017 11:25 12/03/2017 18:08 12/04/2017 00:03 12/04/2017 05:49  Glucose-Capillary Latest Ref Range: 70 - 99 mg/dL 696216 (H) 295284 (H) 284292 (H) 225 (H) 161 (H) 263 (H)   Results for Zachary Deleon, Zachary Deleon (MRN [id number]) as of 12/04/2017 07:42  Ref. Range 12/03/2017 06:01  Hemoglobin A1C Latest Ref Range: 4.8 - 5.6 % 11.4 (H)   Review of Glycemic Control  Diabetes history: DM2 Outpatient Diabetes medications: Lantus 50 units BID, Metformin 1000 mg BID Current orders for Inpatient glycemic control: Lantus 25 units BID, Novolog 0-9 units Q6H  Inpatient Diabetes Program Recommendations: Insulin - Basal: Please consider increasing Lantus to 30 units BID. Correction (SSI): Since patient is eating, please consider changing frequency of CBGs and Novolog to 0-9 units TID with meals and Novolog 0-5 units QHS. Insulin - Meal Coverage: If post prandial glucose continues to be greater than 180 mg/dl, please consider adding Novolog 3 units TID with meals for meal coverage. A1C: A1C 11.4% on 12/03/17 indicating an average glucose of 281 mg/dl over the past 2-3 months.  Thanks, Orlando PennerMarie Kyia Rhude, RN, MSN, CDE Diabetes Coordinator Inpatient Diabetes Program [phone] (Team Pager from 8am to 5pm)

## 2017-12-04 NOTE — Progress Notes (Signed)
OT Cancellation Note  Patient Details Name: Zachary Deleon MRN: [id number] DOB: [birth year]   Cancelled Treatment:    Reason Eval/Treat Not Completed: Patient's level of consciousness. Spoke to nurse, Zachary Deleon and it was recommended that OT evaluation be completed tomorrow. Pt has been agitated today and received Ativan. He recently has been able to rest and would not be able to fully participate in OT evaluation.    Limmie PatriciaLaura Brandalynn Ofallon, OTR/L,CBIS  [phone]  12/04/2017, 3:45 PM

## 2017-12-04 NOTE — Progress Notes (Signed)
Dr. Sherryll BurgerShah  at bedside ,assessment done.

## 2017-12-04 NOTE — Progress Notes (Signed)
[facility]  A. , MD     [email or web address]          Zachary Deleon is an 52 y.o. male.   Assessment/Plan:  1. 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  Dual antiplatelet agents are recommended.  Again physical and occupational treatments are also recommend.  2.   Obstructive sleep apnea syndrome on treated:  Outpatient testing is recommended.  AutoPap  will be given while the patient is hospitalized.  3.  Severe dyslipidemia:  The patient statin should be maximized.  4.   Symmetric tremors of unclear etiology: An EEG will be obtained.  The description seems consistent with metabolic toxic tremors due to medication or other toxic etiologies.  5.   Encephalopathy due to medication effect because of electrode behavior.    6.   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  THE WIFE AND CHILDREN ARE AVAILABLE TODAY.  They report the patient had brain surgery about 22 years ago.  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  The wife reports that he has been having jerking of the extremities the last several weeks to days as far as I can tell.  It appears that he has had total body jerks in being admitted to the hospital.  He also has history of severe obstructive sleep apnea syndrome but has not been using his machine.  Apparently this is due to him having difficulty with the mask.  He discontinued all his medications because his vascular surgeon at [facility] told them that there is not much they can do for him because of his multiple medical conditions.  They would only offer additional treatment under emergency situation.  He apparently got discouraged from this and the essentially gave up on  stop taking care of himself and also stop taking all his medications.  The patient has been belligerent requiring him to be sedated with Ativan and also Seroquel.  He is quite drowsy today.    GENERAL:  This is a pleasant obese male who is in no acute distress.  HEENT:   The patient has a very large neck and large tongue with crowded posterior airspace.  Neck is supple.  ABDOMEN: Soft  EXTREMITIES: There is brown in discoloration of the distal legs with trace ankle edema.   There is significant pain on range of motion involving the left shoulder and left hip.  BACK: Normal alignment.  SKIN: Normal by inspection.    MENTAL STATUS:   He is quite stuporous.  He requires multiple the painful stimuli to be awake and subsequently falls asleep rapidly.  He does follow midline commands and appendicular commands when stimulated.   CRANIAL NERVES: Pupils are equal, round and reactive to light and accommodation; extraocular movements are full, there is no significant nystagmus; upper and lower facial muscles are normal in strength and symmetric, there is no flattening of the nasolabial folds; tongue is midline; uvula is midline; shoulder elevation is normal.  MOTOR:   There is a significant drift of the left upper extremity and left leg.  Left deltoid 4/5.  Hand grip 5. Left hip flexion 4/5 and dorsiflexion 3/5.  There is no drift on the   right side.  The right side shows normal tone, bulk and strength.  COORDINATION: Left finger to nose shows significant dysmetria, right finger to nose is normal, No rest tremor; no intention tremor; no postural tremor; no bradykinesia.  REFLEXES: Deep tendon reflexes are symmetrical and normal.       Objective: Vital signs in last 24 hours: Temp:  [97.6 F (36.4 C)-98.7 F (37.1 C)] 97.6 F (36.4 C) (06/25 1000) Pulse Rate:  [66-82] 71 (06/25 1440) Resp:  [14-20] 20 (06/25 1440) BP: (104-150)/(64-95) 141/81 (06/25 1440) SpO2:  [92 %-100 %] 94 %  (06/25 1440)  Intake/Output from previous day: 06/24 0701 - 06/25 0700 In: 864.7 [P.O.:720; I.V.:144.7] Out: 400 [Urine:400] Intake/Output this shift: Total I/O In: 120 [P.O.:120] Out: -  Nutritional status:  Diet Order           Diet renal/carb modified with fluid restriction Fluid restriction: 2000 mL Fluid; Room service appropriate? Yes; Fluid consistency: Thin  Diet effective now           Lab Results: Results for orders placed or performed during the hospital encounter of 12/02/17 (from the past 48 hour(s))  Ethanol     Status: None   Collection Time: 12/02/17  7:54 PM  Result Value Ref Range   Alcohol, Ethyl (B) <10 <10 mg/dL    Comment: (NOTE) Lowest detectable limit for serum alcohol is 10 mg/dL. For medical purposes only. Performed at [facility], [address] [place], [place] [address]   Protime-INR     Status: None   Collection Time: 12/02/17  7:54 PM  Result Value Ref Range   Prothrombin Time 11.6 11.4 - 15.2 seconds   INR 0.85     Comment: Performed at [facility], [address] [place], [place] [address]  APTT     Status: None   Collection Time: 12/02/17  7:54 PM  Result Value Ref Range   aPTT 32 24 - 36 seconds    Comment: Performed at [facility], [address] [place], [place] [address]  CBC     Status: Abnormal   Collection Time: 12/02/17  7:54 PM  Result Value Ref Range   WBC 7.9 4.0 - 10.5 K/uL   RBC 5.50 4.22 - 5.81 MIL/uL   Hemoglobin 17.3 (H) 13.0 - 17.0 g/dL   HCT 47.7 39.0 - 52.0 %   MCV 86.7 78.0 - 100.0 fL   MCH 31.5 26.0 - 34.0 pg   MCHC 36.3 (H) 30.0 - 36.0 g/dL   RDW 13.7 11.5 - 15.5 %   Platelets 139 (L) 150 - 400 K/uL    Comment: Performed at [facility], [address] [place], [place] [address]  Differential     Status: None   Collection Time: 12/02/17  7:54 PM  Result Value Ref Range   Neutrophils Relative % 58 %   Neutro Abs 4.6 1.7 - 7.7 K/uL   Lymphocytes Relative 31 %   Lymphs Abs 2.4 0.7 - 4.0 K/uL    Monocytes Relative 8 %   Monocytes Absolute 0.7 0.1 - 1.0 K/uL   Eosinophils Relative 2 %   Eosinophils Absolute 0.1 0.0 - 0.7 K/uL   Basophils Relative 1 %   Basophils Absolute 0.1 0.0 - 0.1 K/uL    Comment: Performed at [facility], [address] [place], [place] [address]  Comprehensive metabolic panel     Status: Abnormal   Collection Time: 12/02/17  7:54 PM  Result Value Ref Range   Sodium   127 (L) 135 - 145 mmol/L   Potassium 3.9 3.5 - 5.1 mmol/L   Chloride 94 (L) 101 - 111 mmol/L   CO2 22 22 - 32 mmol/L   Glucose, Bld 378 (H) 65 - 99 mg/dL   BUN 12 6 - 20 mg/dL   Creatinine, Ser 0.77 0.61 - 1.24 mg/dL   Calcium 8.9 8.9 - 10.3 mg/dL   Total Protein 6.4 (L) 6.5 - 8.1 g/dL   Albumin 3.9 3.5 - 5.0 g/dL   AST 19 15 - 41 U/L   ALT 23 17 - 63 U/L   Alkaline Phosphatase 76 38 - 126 U/L   Total Bilirubin 0.5 0.3 - 1.2 mg/dL   GFR calc non Af Amer >60 >60 mL/min   GFR calc Af Amer >60 >60 mL/min    Comment: (NOTE) The eGFR has been calculated using the CKD EPI equation. This calculation has not been validated in all clinical situations. eGFR's persistently <60 mL/min signify possible Chronic Kidney Disease.    Anion gap 11 5 - 15    Comment: Performed at [facility], [address] [place], [place] [address]  HIV antibody (Routine Testing)     Status: None   Collection Time: 12/02/17  7:54 PM  Result Value Ref Range   HIV Screen 4th Generation wRfx Non Reactive Non Reactive    Comment: (NOTE) Performed At: [facility] [address] [place], [place] [id number] Nagendra Sanjai MD Ph:[phone] Performed at [facility], [address] [place],  [address]   I-Stat Chem 8, ED     Status: Abnormal   Collection Time: 12/02/17  8:16 PM  Result Value Ref Range   Sodium 131 (L) 135 - 145 mmol/L   Potassium 4.1 3.5 - 5.1 mmol/L   Chloride 100 (L) 101 - 111 mmol/L   BUN 12 6 - 20 mg/dL   Creatinine, Ser 0.70 0.61 - 1.24 mg/dL   Glucose, Bld 390 (H) 65 - 99  mg/dL   Calcium, Ion 1.13 (L) 1.15 - 1.40 mmol/L   TCO2 24 22 - 32 mmol/L   Hemoglobin 16.3 13.0 - 17.0 g/dL   HCT 48.0 39.0 - 52.0 %  Urine rapid drug screen (hosp performed)     Status: Abnormal   Collection Time: 12/02/17  8:52 PM  Result Value Ref Range   Opiates NONE DETECTED NONE DETECTED   Cocaine NONE DETECTED NONE DETECTED   Benzodiazepines NONE DETECTED NONE DETECTED   Amphetamines NONE DETECTED NONE DETECTED   Tetrahydrocannabinol NONE DETECTED NONE DETECTED   Barbiturates (A) NONE DETECTED    Result not available. Reagent lot number recalled by manufacturer.    Comment: (NOTE) DRUG SCREEN FOR MEDICAL PURPOSES ONLY.  IF CONFIRMATION IS NEEDED FOR ANY PURPOSE, NOTIFY LAB WITHIN 5 DAYS. LOWEST DETECTABLE LIMITS FOR URINE DRUG SCREEN Drug Class                     Cutoff (ng/mL) Amphetamine and metabolites    1000 Barbiturate and metabolites    200 Benzodiazepine                 200 Tricyclics and metabolites     300 Opiates and metabolites        300 Cocaine and metabolites        300 THC                            50 Performed   at [facility], [address], [place], [place] [address]   Urinalysis, Routine w reflex microscopic     Status: Abnormal   Collection Time: 12/02/17  8:54 PM  Result Value Ref Range   Color, Urine YELLOW YELLOW   APPearance CLEAR CLEAR   Specific Gravity, Urine 1.028 1.005 - 1.030   pH 5.0 5.0 - 8.0   Glucose, UA >=500 (A) NEGATIVE mg/dL   Hgb urine dipstick NEGATIVE NEGATIVE   Bilirubin Urine NEGATIVE NEGATIVE   Ketones, ur NEGATIVE NEGATIVE mg/dL   Protein, ur 100 (A) NEGATIVE mg/dL   Nitrite NEGATIVE NEGATIVE   Leukocytes, UA NEGATIVE NEGATIVE   RBC / HPF 0-5 0 - 5 RBC/hpf   WBC, UA 0-5 0 - 5 WBC/hpf   Bacteria, UA NONE SEEN NONE SEEN    Comment: Performed at [facility], [address] [place], [place] [address]  CBG monitoring, ED     Status: Abnormal   Collection Time: 12/02/17 11:59 PM  Result Value Ref Range    Glucose-Capillary 275 (H) 65 - 99 mg/dL  CBG monitoring, ED     Status: Abnormal   Collection Time: 12/03/17  3:56 AM  Result Value Ref Range   Glucose-Capillary 216 (H) 65 - 99 mg/dL  Hemoglobin A1c     Status: Abnormal   Collection Time: 12/03/17  6:01 AM  Result Value Ref Range   Hgb A1c MFr Bld 11.4 (H) 4.8 - 5.6 %    Comment: (NOTE) Pre diabetes:          5.7%-6.4% Diabetes:              >6.4% Glycemic control for   <7.0% adults with diabetes    Mean Plasma Glucose 280.48 mg/dL    Comment: Performed at [facility], [place] [address] [place], [place] [address]  Lipid panel     Status: Abnormal   Collection Time: 12/03/17  6:01 AM  Result Value Ref Range   Cholesterol 279 (H) 0 - 200 mg/dL   Triglycerides 1,256 (H) <150 mg/dL    Comment: RESULTS CONFIRMED BY MANUAL DILUTION   HDL NOT REPORTED DUE TO HIGH TRIGLYCERIDES >40 mg/dL   Total CHOL/HDL Ratio NOT REPORTED DUE TO HIGH TRIGLYCERIDES RATIO   VLDL UNABLE TO CALCULATE IF TRIGLYCERIDE OVER 400 mg/dL 0 - 40 mg/dL   LDL Cholesterol UNABLE TO CALCULATE IF TRIGLYCERIDE OVER 400 mg/dL 0 - 99 mg/dL    Comment:        Total Cholesterol/HDL:CHD Risk Coronary Heart Disease Risk Table                     Men   Women  1/2 Average Risk   3.4   3.3  Average Risk       5.0   4.4  2 X Average Risk   9.6   7.1  3 X Average Risk  23.4   11.0        Use the calculated Patient Ratio above and the CHD Risk Table to determine the patient's CHD Risk.        ATP III CLASSIFICATION (LDL):  <100     mg/dL   Optimal  100-129  mg/dL   Near or Above                    Optimal  130-159  mg/dL   Borderline  160-189  mg/dL   High  >190     mg/dL  Very High Performed at [facility], [address], [place], [place] [address]   Glucose, capillary     Status: Abnormal   Collection Time: 12/03/17  8:50 AM  Result Value Ref Range   Glucose-Capillary 284 (H) 65 - 99 mg/dL   Comment 1 Notify RN    Comment 2 Document in Chart   Glucose,  capillary     Status: Abnormal   Collection Time: 12/03/17 11:25 AM  Result Value Ref Range   Glucose-Capillary 292 (H) 65 - 99 mg/dL   Comment 1 Notify RN    Comment 2 Document in Chart   Glucose, capillary     Status: Abnormal   Collection Time: 12/03/17  6:08 PM  Result Value Ref Range   Glucose-Capillary 225 (H) 65 - 99 mg/dL   Comment 1 Notify RN    Comment 2 Document in Chart   Glucose, capillary     Status: Abnormal   Collection Time: 12/04/17 12:03 AM  Result Value Ref Range   Glucose-Capillary 161 (H) 70 - 99 mg/dL   Comment 1 Notify RN    Comment 2 Document in Chart   Glucose, capillary     Status: Abnormal   Collection Time: 12/04/17  5:49 AM  Result Value Ref Range   Glucose-Capillary 263 (H) 70 - 99 mg/dL  CBC     Status: Abnormal   Collection Time: 12/04/17  6:47 AM  Result Value Ref Range   WBC 10.6 (H) 4.0 - 10.5 K/uL   RBC 4.83 4.22 - 5.81 MIL/uL   Hemoglobin 14.6 13.0 - 17.0 g/dL   HCT 41.7 39.0 - 52.0 %   MCV 86.3 78.0 - 100.0 fL   MCH 30.2 26.0 - 34.0 pg   MCHC 35.0 30.0 - 36.0 g/dL   RDW 13.4 11.5 - 15.5 %   Platelets 112 (L) 150 - 400 K/uL    Comment: PLATELET COUNT CONFIRMED BY SMEAR Performed at [facility], [address], [place], [place] [address]   Basic metabolic panel     Status: Abnormal   Collection Time: 12/04/17  6:47 AM  Result Value Ref Range   Sodium 126 (L) 135 - 145 mmol/L   Potassium 3.8 3.5 - 5.1 mmol/L   Chloride 92 (L) 98 - 111 mmol/L   CO2 24 22 - 32 mmol/L   Glucose, Bld 234 (H) 70 - 99 mg/dL   BUN 9 6 - 20 mg/dL   Creatinine, Ser 0.73 0.61 - 1.24 mg/dL   Calcium 8.3 (L) 8.9 - 10.3 mg/dL   GFR calc non Af Amer >60 >60 mL/min   GFR calc Af Amer >60 >60 mL/min    Comment: (NOTE) The eGFR has been calculated using the CKD EPI equation. This calculation has not been validated in all clinical situations. eGFR's persistently <60 mL/min signify possible Chronic Kidney Disease.    Anion gap 10 5 - 15    Comment: Performed  at [facility], [address], [place], [place] [address]  Magnesium     Status: Abnormal   Collection Time: 12/04/17  6:47 AM  Result Value Ref Range   Magnesium 1.5 (L) 1.7 - 2.4 mg/dL    Comment: Performed at [facility], [address] [place], [place] [address]  Glucose, capillary     Status: Abnormal   Collection Time: 12/04/17 11:45 AM  Result Value Ref Range   Glucose-Capillary 234 (H) 70 - 99 mg/dL   Comment 1 Notify RN    Comment  2 Document in Chart     Lipid Panel Recent Labs    12/03/17 0601  CHOL 279*  TRIG 1,256*  HDL NOT REPORTED DUE TO HIGH TRIGLYCERIDES  CHOLHDL NOT REPORTED DUE TO HIGH TRIGLYCERIDES  VLDL UNABLE TO CALCULATE IF TRIGLYCERIDE OVER 400 mg/dL  LDLCALC UNABLE TO CALCULATE IF TRIGLYCERIDE OVER 400 mg/dL    Studies/Results:  CAROTID DOPPLERS RIGHT  ICA:  Occluded cm/sec  CCA:  52 cm/sec  SYSTOLIC ICA/CCA RATIO:  Occluded  DIASTOLIC ICA/CCA RATIO:  Occluded  ECA:  179 cm/sec  LEFT  ICA:  405 cm/sec  CCA:  78 cm/sec  SYSTOLIC ICA/CCA RATIO:  5.2  DIASTOLIC ICA/CCA RATIO:  ECA:  124 cm/sec  RIGHT CAROTID ARTERY: The internal carotid is occluded just above the bulb. There is significant irregular plaque at the origin of the external carotid artery.  RIGHT VERTEBRAL ARTERY:  Antegrade.  LEFT CAROTID ARTERY: There is mild smooth soft plaque in the lower common carotid artery. There is extensive predominately soft plaque in the bulb. Low resistance internal carotid Doppler pattern is preserved with a markedly elevated velocity.  LEFT VERTEBRAL ARTERY:  Antegrade.  IMPRESSION: Right internal carotid artery occlusion.  Greater than 70% stenosis in the left internal carotid artery.  Vertebral arteries are antegrade in flow.      BRAIN MRA MRI FINDINGS: MRI HEAD FINDINGS  Brain: Patchy areas of infarct in the right MCA territory show restricted diffusion and patchy enhancement compatible with  subacute infarct. This involves the posterior insula on the right extending into the right frontal and parietal lobe. No associated hemorrhage mass, effect or midline shift. Ventricle size mildly prominent due to atrophy.  Occipital craniotomy. No evidence of recurrent tumor. Mild encephalomalacia right cerebellum appears chronic.  Vascular: Normal arterial flow voids  Skull and upper cervical spine: Occipital craniotomy. No acute skeletal abnormality  Sinuses/Orbits: Paranasal sinuses clear.  Bilateral cataract surgery  Other: None  MRA HEAD FINDINGS  Occlusion of the right internal carotid artery through the cavernous segment with reconstitution of the supraclinoid internal carotid artery on the right. Right middle cerebral artery patent with moderate stenosis at the right MCA bifurcation extending into the M2 branches. Right anterior cerebral artery widely patent  Left internal carotid artery widely patent. Moderate to severe stenosis origin of left A1 segment. Otherwise both anterior middle cerebral arteries are patent without stenosis.  Both vertebral arteries patent to the basilar. Basilar widely patent. Superior cerebellar and posterior cerebral arteries patent bilaterally.  IMPRESSION: Patchy areas of subacute infarct in the right posterior MCA territory. These show restricted diffusion and enhancement.  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 Moderate stenosis right MCA bifurcation extending into the M2 branches. Severe stenosis left A1 segment.       The brain MRI and MRA reviewed in person.  There are multiple increased signal seen on DWI involving the parasagittal region on the right side essentially approximating watershed distribution.  There is also similar findings involving the insular cortex and frontal cortex along with the surrounding subcortical structures again on the right side.    There is mild  global atrophy.  No hemorrhage is appreciated. MRA shows absent right ICA.  There is also absent A1 on the right which reconstitute along the A2 segment.  There is luminal irregularity of the distal  M1 segment and distal to this.    Medications:  Scheduled Meds: .  stroke: mapping our early stages of recovery book     Does not apply Once  . aspirin  300 mg Rectal Daily   Or  . aspirin  325 mg Oral Daily  . [START ON 12/05/2017] atorvastatin  80 mg Oral q1800  . [START ON 12/05/2017] clopidogrel  75 mg Oral Q breakfast  . enoxaparin (LOVENOX) injection  70 mg Subcutaneous Q24H  . fenofibrate  160 mg Oral Daily  . FLUoxetine  20 mg Oral Daily  . gabapentin  100 mg Oral TID  . insulin aspart  0-15 Units Subcutaneous TID WC  . insulin aspart  0-5 Units Subcutaneous QHS  . insulin aspart  6 Units Subcutaneous TID WC  . insulin glargine  40 Units Subcutaneous QHS  . mometasone-formoterol  2 puff Inhalation BID  . nicotine  21 mg Transdermal Daily  . QUEtiapine  25 mg Oral BID   Continuous Infusions: . sodium chloride Stopped (12/03/17 1500)   PRN Meds:.acetaminophen **OR** acetaminophen (TYLENOL) oral liquid 160 mg/5 mL **OR** acetaminophen, butalbital-acetaminophen-caffeine, LORazepam, senna-docusate     LOS: 2 days    A. , M.D.  Diplomate, American Board of Psychiatry and Neurology ( Neurology).   

## 2017-12-04 NOTE — Progress Notes (Signed)
Received call from Dr. Krystal EatonShah,given update.Resting with eyes closed.Advised to take patient to CT.call ended.

## 2017-12-04 NOTE — Progress Notes (Signed)
Physical Therapy Treatment Patient Details Name: Zachary Deleon MRN: [id number] DOB: 06-Mar-1966 Today's Date: 12/04/2017    History of Present Illness Zachary Deleon  is a 52 y.o. male, with history of diabetes mellitus, tobacco abuse, history of DVT in the past, hypertension who has been noncompliant with his medications and has not taken any medication for past 6 months came to hospital with left arm numbness for past 3 days.  As per patient's wife he was having difficulty walking downstairs case and with difficulty using his left hand to grab onto rolling as he descended the stairs.  Patient also had difficulty with speech.  Speech was slurred.    PT Comments    Pt with increased agitation upon arrival, [facility] with daughter/wife present.  Pt with extreme impulsivities, difficulty following directions and increased weakness.  Pt required more assistance today with transfers/mobiltiy as Lt UE/LE completely weak and not assisting in movement.  Max assist with bed mobility and general safety and max assist +2 for stand [facility].  Pt unable to get properly positioned on BSC and voided in floor/on self as result.  Pt with difficulty following commands and with general agitation and impulsiveness makes it difficult to safely complete mobility at this time.  Recommend using a lift until LE's stronger and can focus on task clearer.     Follow Up Recommendations  SNF     Equipment Recommendations  Rolling walker with 5" wheels    Recommendations for Other Services       Precautions / Restrictions Precautions Precautions: Fall Precaution Comments: impulsive, weak LE's and inability to stand    Mobility  Bed Mobility Overal bed mobility: Needs Assistance Bed Mobility: Supine to Sit;Sit to Supine     Supine to sit: Max assist Sit to supine: Max assist   General bed mobility comments: impulsive, difficutly following instructions and general Lt sided  weakness  Transfers Overall transfer level: Needs assistance Equipment used: None Transfers: Stand Pivot Transfers Sit to Stand: Max assist;+2 safety/equipment Stand pivot transfers: Max assist;+2 safety/equipment       General transfer comment: impulsive, difficutly following instructions and general Lt sided weakness  Ambulation/Gait                 Stairs             Wheelchair Mobility    Modified Rankin (Stroke Patients Only)       Balance Overall balance assessment: Needs assistance Sitting-balance support: Feet supported;Bilateral upper extremity supported Sitting balance-Leahy Scale: Fair   Postural control: Left lateral lean Standing balance support: Bilateral upper extremity supported;During functional activity Standing balance-Leahy Scale: Poor                              Cognition Arousal/Alertness: Awake/alert Behavior During Therapy: Agitated;Anxious;Impulsive Overall Cognitive Status: Impaired/Different from baseline Area of Impairment: Safety/judgement                       Following Commands: Follows one step commands inconsistently Safety/Judgement: Decreased awareness of safety;Decreased awareness of deficits            Exercises      General Comments        Pertinent Vitals/Pain Pain Assessment: No/denies pain    Home Living  PT Goals (current goals can now be found in the care plan section) Progress towards PT goals: Not progressing toward goals - comment(pt with increased weakness and agitation today)    Frequency    7X/week      PT Plan  Continue to progress mobililtiy, LE strength, safety and return to prior function.    Co-evaluation              AM-PAC PT "6 Clicks" Daily Activity  Outcome Measure  Difficulty turning over in bed (including adjusting bedclothes, sheets and blankets)?: A Little Difficulty moving from lying on back to  sitting on the side of the bed? : A Lot Difficulty sitting down on and standing up from a chair with arms (e.g., wheelchair, bedside commode, etc,.)?: A Lot Help needed moving to and from a bed to chair (including a wheelchair)?: A Lot Help needed walking in hospital room?: Total Help needed climbing 3-5 steps with a railing? : Total 6 Click Score: 11    End of Session Equipment Utilized During Treatment: Gait belt Activity Tolerance: Patient limited by fatigue;Treatment limited secondary to agitation Patient left: in bed;with bed alarm set;with family/visitor present;with call bell/phone within reach   PT Visit Diagnosis: Unsteadiness on feet (R26.81);Other abnormalities of gait and mobility (R26.89);Muscle weakness (generalized) (M62.81)     Time: 1610-9604 PT Time Calculation (min) (ACUTE ONLY): 30 min  Charges:  $Therapeutic Activity: 23-37 mins                      Lurena Nida, PTA/CLT [phone]    Bascom Levels, Alasha Mcguinness B 12/04/2017, 10:25 AM

## 2017-12-04 NOTE — Progress Notes (Addendum)
Assessed patient at bedside and he is very agitated and hyperkinetic. Appears very uncomfortable. Reviewed head CT and MRI imaging with Dr. Wilford CornerArora of Neurology at [facility] due to concerns for noted cytotoxic edema in the area of R MCA infarct. He states that the findings are expected and not concerning for impending herniation or mass effect.   Repeat head CT is unlikely to contribute further information at this point unless there is a significant neurological decline. I have given orders for IV Ativan at this time and Dr. Wilford CornerArora recommends Seroquel if he continues to have added agitation.   Update: Pt did not require Seroquel and responded well to the Ativan. Family persistently requested repeat head CT which I had ordered. No new acute findings noted at this time. I have updated family members at bedside.

## 2017-12-04 NOTE — Progress Notes (Signed)
Dr. Sherryll BurgerShah returned to bedside,awaiting new orders,will continue to monitorr.Family remains at bedside.

## 2017-12-04 NOTE — Progress Notes (Signed)
SLP Cancellation Note  Patient Details Name: Lucianne Leilton J Proby MRN: [id number] DOB: 1965/11/05   Cancelled treatment:       Reason Eval/Treat Not Completed: Fatigue/lethargy limiting ability to participate;Medical issues which prohibited therapy; Pt just given Ativan and is very restless in bed. SLP spoke with Pt's daughter and Charity fundraiserN. SLP will return later this afternoon to evaluate via BSE/SLE if Pt appropriate.  Thank you,  Havery MorosDabney Porter, CCC-SLP [phone]    PORTER,DABNEY 12/04/2017, 12:31 PM

## 2017-12-05 ENCOUNTER — Inpatient Hospital Stay (HOSPITAL_COMMUNITY)
Admit: 2017-12-05 | Discharge: 2017-12-05 | Disposition: A | Discharge status: Home / Self Care | Payer: Medicare Other | Attending: Neurology | Admitting: Neurology

## 2017-12-05 LAB — GLUCOSE, CAPILLARY
GLUCOSE-CAPILLARY: 158 mg/dL — AB (ref 70–99)
GLUCOSE-CAPILLARY: 201 mg/dL — AB (ref 70–99)
GLUCOSE-CAPILLARY: 244 mg/dL — AB (ref 70–99)
Glucose-Capillary: 158 mg/dL — ABNORMAL HIGH (ref 70–99)
Glucose-Capillary: 155 mg/dL — ABNORMAL HIGH (ref 70–99)
Glucose-Capillary: 252 mg/dL — ABNORMAL HIGH (ref 70–99)

## 2017-12-05 MED ORDER — TRAMADOL HCL 50 MG PO TABS
25.0000 mg | ORAL_TABLET | Freq: Four times a day (QID) | ORAL | Status: DC | PRN
  Administered 2017-12-05 – 2017-12-06 (×3): 50 mg via ORAL
  Administered 2017-12-07: 25 mg via ORAL
  Filled 2017-12-05 (×4): qty 1

## 2017-12-05 MED ORDER — INSULIN GLARGINE 100 UNIT/ML ~~LOC~~ SOLN
45.0000 [IU] | Freq: Every day | SUBCUTANEOUS | Status: DC
  Administered 2017-12-05 – 2017-12-06 (×2): 45 [IU] via SUBCUTANEOUS
  Filled 2017-12-05 (×5): qty 0.45

## 2017-12-05 MED ORDER — INSULIN ASPART 100 UNIT/ML ~~LOC~~ SOLN: 8.0000 [IU] | Freq: Three times a day (TID) | SUBCUTANEOUS | Status: DC

## 2017-12-05 NOTE — Progress Notes (Signed)
EEG completed, results pending. 

## 2017-12-05 NOTE — Care Management Important Message (Signed)
Important Message  Patient Details  Name: Zachary Deleon MRN: [id number] Date of Birth: 12-02-1965   Medicare Important Message Given:  Yes    Renie OraHawkins, Cashlynn Yearwood Smith 12/05/2017, 11:24 AM

## 2017-12-05 NOTE — Evaluation (Signed)
Speech Language Pathology Evaluation Patient Details Name: Zachary Deleon MRN: [id number] DOB: 03/02/1966 Today's Date: 12/05/2017 Time: [id number] SLP Time Calculation (min) (ACUTE ONLY): 15 min  Problem List:  Patient Active Problem List   Diagnosis Date Noted  . Stroke (HCC) 12/02/2017  . Type 2 diabetes mellitus without complication (HCC) 12/02/2017   Past Medical History:  Past Medical History:  Diagnosis Date  . Arthritis   . COPD (chronic obstructive pulmonary disease) (HCC)   . Diabetes mellitus without complication (HCC)   . DVT (deep venous thrombosis) (HCC)   . Hypertension   . Seizures (HCC)    Past Surgical History:  Past Surgical History:  Procedure Laterality Date  . BRAIN SURGERY    . BRAIN TUMOR EXCISION     [facility]  . CATARACT EXTRACTION     HPI:  Zachary Deleon Yearick  is a 52 y.o. male, with history of diabetes mellitus, tobacco abuse, history of DVT in the past, hypertension who has been noncompliant with his medications and has not taken any medication for past 6 months came to hospital with left arm numbness for past 3 days.  As per patient's wife he was having difficulty walking downstairs case and with difficulty using his left hand to grab onto rolling as he descended the stairs.  Patient also had difficulty with speech.  Speech was slurred.    Assessment / Plan / Recommendation Clinical Impression  Speech Language evaluation was completed after rousing patient from somnolence. Oral motor exam revealing mild/mod decreased L facial strength and ROM and lingual ROM and strength. Pt answered questions regarding basic biographical information, was oriented X3 (disoriented to time) and was able to count backwards from 20. Generational naming was noted to be within functional limits. According to other notes and clinical observation question mild/mod impairment with noted impulsivity and lack of insight into deficits. Mild/moderate flaccid dysarthria with  intelligibility noted at ~75% intelligible. Pt will benefit from more thorough cognitive assessment and speech therapy at SNF. ST will also continue to follow while in acute setting    SLP Assessment  SLP Recommendation/Assessment: Patient needs continued Speech Lanaguage Pathology Services SLP Visit Diagnosis: Dysphagia, unspecified (R13.10);Dysarthria and anarthria (R47.1)    Follow Up Recommendations       Frequency and Duration min 1 x/week  1 week      SLP Evaluation Cognition  Overall Cognitive Status: Impaired/Different from baseline Arousal/Alertness: Lethargic Orientation Level: Oriented to person;Oriented to place;Oriented to situation;Disoriented to time Attention: Focused Focused Attention: Impaired Executive Function: Reasoning Reasoning: Impaired Reasoning Impairment: Functional basic       Comprehension  Auditory Comprehension Overall Auditory Comprehension: Appears within functional limits for tasks assessed    Expression Verbal Expression Overall Verbal Expression: Impaired Initiation: No impairment   Oral / Motor  Oral Motor/Sensory Function Overall Oral Motor/Sensory Function: Moderate impairment Facial ROM: Reduced left Facial Symmetry: Abnormal symmetry left Facial Strength: Reduced left Facial Sensation: Reduced left Lingual ROM: Reduced left Lingual Symmetry: Abnormal symmetry left Lingual Strength: Reduced Lingual Sensation: Within Functional Limits Motor Speech Overall Motor Speech: Impaired Articulation: Impaired Level of Impairment: Word Intelligibility: Intelligibility reduced Word: 50-74% accurate Phrase: 75-100% accurate Sentence: 50-74% accurate     Zachary H. Romie LeveeYarbrough MA, CCC-SLP Speech Language Pathologist                     Zachary Deleon 12/05/2017, 5:02 PM

## 2017-12-05 NOTE — Progress Notes (Signed)
PROGRESS NOTE  Zachary Deleon  ZOX:[id number] DOB: June 27, 1965 DOA: 12/02/2017 PCP: Nelma RothmanGibson, Robin Brinkley, NP    Brief Narrative:  52 year old with past medical history relevant for hyperlipidemia, hypertriglyceridemia, type 2 diabetes on insulin (poorly compliant), severe peripheral vascular disease, hypertension, COPD, gated by active tobacco abuse, history of DVT on apixaban noncompliant who was admitted with left-sided symptoms including gait difficulty, dysarthria, numbness and was found to have right posterior MCA subacute infarct as well as occlusion of the right ICA and stenosis of the right MCA.  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  He is diffusely weak on the left side with decreased sensation.  He also has some dysarthria. -PT/OT -Speech line which pathology - Continue atorvastatin 20 mg, will increase to 80 - Continue aspirin 325 mg daily -Neurology consult -Carotid ultrasound shows occlusion of right carotid and greater than 70% stenosis on left, will need referral to outpatient vascular surgery ([facility], Dr. Isabell JarvisVelasquez Ramirez) - Hold antihypertensives for 24 hours -Lipid panel grossly abnormal with severe hyper triglyceridemia -Hemoglobin A1c was greater than 11 - Working to intensify treatments for better glycemic control.   #) Hypertension/hyperlipidemia/hyperglyceridemia: Permissive hypertension -Increased atorvastatin to 80 mg daily - Hold amlodipine 10 mg -Hold lisinopril-HCTZ 20-12.5 mg daily -Continue gemfibrozil 600 mg twice daily  #) Peripheral vascular disease: No records here however [facility].  Per discussion with the wife patient is not considered a surgical candidate due to his noncompliance and medical  comorbidities. -Continue statin - Holding cilostazol 100 mg twice daily  #) COPD, gated by active tobacco abuse: -Counseling provided -Continue L AMA, LA BA/ICS -Continue PRN bronchodilators  #) History of DVT: Patient is noncompliant with apixaban -Hold apixaban 5 mg twice daily  #) Type 2 diabetes on insulin: -Continue glargine daily.  -Sliding scale insulin, AC at bedtime -Hold metformin thousand milligrams twice daily -Added mealtime insulin as his A1c is grossly elevated  #) Pain/psych: -Continue fluoxetine 20 mg daily -Continue gabapentin 100 mg 3 times daily -- With increasing agitation and delirium, trial of seroquel 25 mg BID prn, lorazepam for anxiety and sedation ordered as needed  #) Class III obesity: Based on exam patient She likely has some degree of OSA and almost certainly has obesity hypoventilation syndrome. -We will need outpatient sleep study  Fluids: Tolerating p.o. Elect lites: Monitor and supplement Nutrition: Carb/heart healthy diet  Prophylaxis: Enoxaparin  Disposition: PT recommending SNF placement   DO NOT RESUSCITATE   Consultants:   Neurology  Procedures:   Echo 12/03/2017: Study Conclusions - Left ventricle: The cavity size was normal. Wall thickness was   normal. Systolic function was normal. The estimated ejection   fraction was in the range of 55% to 60%. Wall motion was normal;   there were no regional wall motion abnormalities. Left   ventricular diastolic function parameters were normal. - Aortic valve: Mildly calcified annulus. Trileaflet; normal   thickness leaflets. Valve area (VTI): 1.98 cm^2. Valve area   (Vmax): 1.7 cm^2. - Left atrium: The atrium was mildly dilated. - Technically adequate study.  Subjective: Pt a little less agitated, he is upset that RN won't let him ambulate without assistance.  He is very unsteady on feet.   Objective: Vitals:   12/04/17 2252  12/05/17 0200 12/05/17 0600 12/05/17 0749  BP:   123/82 112/79   Pulse: 73 69 63   Resp: 16 18 17    Temp:  98.9 F (37.2 C) 98.4 F (36.9 C)   TempSrc:  Oral Oral   SpO2: 93% 96% 96% 97%  Weight:      Height:        Intake/Output Summary (Last 24 hours) at 12/05/2017 1001 Last data filed at 12/04/2017 1712 Gross per 24 hour  Intake 240 ml  Output 550 ml  Net -310 ml   Filed Weights   12/02/17 1941 12/03/17 0813  Weight: 136.1 kg (300 lb) 116.3 kg (256 lb 6.3 oz)    Examination:  General exam: awake, alert, speaking better, NAD.    Respiratory system: No increased work of breathing, scattered rhonchi and intermittent wheezes, no crackles Cardiovascular system: Distant heart sounds, regular rate and rhythm, no murmurs Gastrointestinal system: Soft, nondistended, no rebound or guarding, normal BS.  Central nervous system: Alert and oriented.  Clear speech patterns,  Pronounced left hemiparesis.  Extremities: no cyanosis or clubbing.  Skin: No rashes on visible skin Psychiatry: Judgement and insight appear poor l. Mood & affect appropriate.   Data Reviewed: I have personally reviewed following labs and imaging studies  CBC: Recent Labs  Lab 12/02/17 1954 12/02/17 2016 12/04/17 0647  WBC 7.9  --  10.6*  NEUTROABS 4.6  --   --   HGB 17.3* 16.3 14.6  HCT 47.7 48.0 41.7  MCV 86.7  --  86.3  PLT 139*  --  112*   Basic Metabolic Panel: Recent Labs  Lab 12/02/17 1954 12/02/17 2016 12/04/17 0647  NA 127* 131* 126*  K 3.9 4.1 3.8  CL 94* 100* 92*  CO2 22  --  24  GLUCOSE 378* 390* 234*  BUN 12 12 9   CREATININE 0.77 0.70 0.73  CALCIUM 8.9  --  8.3*  MG  --   --  1.5*   GFR: Estimated Creatinine Clearance: 144.7 mL/min (by C-G formula based on SCr of 0.73 mg/dL). Liver Function Tests: Recent Labs  Lab 12/02/17 1954  AST 19  ALT 23  ALKPHOS 76  BILITOT 0.5  PROT 6.4*  ALBUMIN 3.9   No results for input(s): LIPASE, AMYLASE in the last 168 hours. No results for input(s): AMMONIA in the last 168  hours. Coagulation Profile: Recent Labs  Lab 12/02/17 1954  INR 0.85   Cardiac Enzymes: No results for input(s): CKTOTAL, CKMB, CKMBINDEX, TROPONINI in the last 168 hours. BNP (last 3 results) No results for input(s): PROBNP in the last 8760 hours. HbA1C: Recent Labs    12/03/17 0601  HGBA1C 11.4*   CBG: Recent Labs  Lab 12/04/17 1145 12/04/17 1621 12/04/17 2159 12/05/17 0404 12/05/17 0729  GLUCAP 234* 200* 188* 158* 158*   Lipid Profile: Recent Labs    12/03/17 0601  CHOL 279*  HDL NOT REPORTED DUE TO HIGH TRIGLYCERIDES  LDLCALC UNABLE TO CALCULATE IF TRIGLYCERIDE OVER 400 mg/dL  TRIG 1,610*  CHOLHDL NOT REPORTED DUE TO HIGH TRIGLYCERIDES   Thyroid Function Tests: No results for input(s): TSH, T4TOTAL, FREET4, T3FREE, THYROIDAB in the last 72 hours. Anemia Panel: No results for input(s): VITAMINB12, FOLATE, FERRITIN, TIBC, IRON, RETICCTPCT in the last 72 hours. Sepsis Labs: No results for input(s): PROCALCITON, LATICACIDVEN in the last 168 hours.  No results found for this or any previous visit (from the past 240 hour(s)).   Radiology Studies: Ct Head Wo Contrast  Result  Date: 12/04/2017 CLINICAL DATA:  Initial evaluation for increased confusion, unresponsiveness. EXAM: CT HEAD WITHOUT CONTRAST TECHNIQUE: Contiguous axial images were obtained from the base of the skull through the vertex without intravenous contrast. COMPARISON:  Prior CT and MRI from 12/03/2017. FINDINGS: Brain: Scattered areas of evolving right MCA infarction again seen, stable in size and distribution from previous. No evidence for hemorrhagic transformation or significant mass effect. No acute intracranial hemorrhage. No new large vessel territory infarct identified. No mass lesion or midline shift. Stable ventricular size and morphology without hydrocephalus. No extra-axial fluid collection. Prior right occipital craniotomy with underlying encephalomalacia, stable. Vascular: No hyperdense  vessel. Scattered vascular calcifications noted within the carotid siphons. Skull: Scalp soft tissues within normal limits. Prior right occipital craniotomy. Sinuses/Orbits: Globes and orbital soft tissues demonstrate no acute finding. Mild scattered mucosal thickening within the ethmoidal air cells. Paranasal sinuses are otherwise clear. No mastoid effusion. IMPRESSION: 1. Continued interval evolution of patchy right MCA territory infarcts. No evidence for hemorrhagic transformation. 2. No other new acute intracranial abnormality. Electronically Signed   By: Rise Mu M.D.   On: 12/04/2017 05:21   Ct Head Wo Contrast  Result Date: 12/03/2017 CLINICAL DATA:  52 year old male with acute severe headache. Areas of subacute infarct involving the right MCA territory was seen on recent MRI. EXAM: CT HEAD WITHOUT CONTRAST TECHNIQUE: Contiguous axial images were obtained from the base of the skull through the vertex without intravenous contrast. COMPARISON:  Head CT dated 12/02/2017 and MRI dated 12/03/2017 FINDINGS: Brain: There are patchy areas of lower attenuation primarily involving the right parietal lobe white matter consistent with cytotoxic edema. No associated significant mass effect. There is no midline shift. There is no acute intracranial hemorrhage. There is stable mild dilatation of the ventricles. Vascular: No hyperdense vessel or unexpected calcification. Skull: Postsurgical changes of suboccipital craniectomy. No acute calvarial pathology. Sinuses/Orbits: Mild mucoperiosteal thickening of paranasal sinuses. No air-fluid level. The mastoid air cells are clear. Other: None IMPRESSION: 1. No acute intracranial hemorrhage. 2. Development of patchy area of cytotoxic edema in the right parietal lobe corresponding to the right MCA territory infarct. Electronically Signed   By: Elgie Collard M.D.   On: 12/03/2017 21:24   US Carotid Bilateral (at [facility] Only)  Result Date:  12/03/2017 CLINICAL DATA:  Stroke EXAM: BILATERAL CAROTID DUPLEX ULTRASOUND TECHNIQUE: Wallace Cullens scale imaging, color Doppler and duplex ultrasound were performed of bilateral carotid and vertebral arteries in the neck. COMPARISON:  None. FINDINGS: Criteria: Quantification of carotid stenosis is based on velocity parameters that correlate the residual internal carotid diameter with NASCET-based stenosis levels, using the diameter of the distal internal carotid lumen as the denominator for stenosis measurement. The following velocity measurements were obtained: RIGHT ICA:  Occluded cm/sec CCA:  52 cm/sec SYSTOLIC ICA/CCA RATIO:  Occluded DIASTOLIC ICA/CCA RATIO:  Occluded ECA:  179 cm/sec LEFT ICA:  405 cm/sec CCA:  78 cm/sec SYSTOLIC ICA/CCA RATIO:  5.2 DIASTOLIC ICA/CCA RATIO: ECA:  161 cm/sec RIGHT CAROTID ARTERY: The internal carotid is occluded just above the bulb. There is significant irregular plaque at the origin of the external carotid artery. RIGHT VERTEBRAL ARTERY:  Antegrade. LEFT CAROTID ARTERY: There is mild smooth soft plaque in the lower common carotid artery. There is extensive predominately soft plaque in the bulb. Low resistance internal carotid Doppler pattern is preserved with a markedly elevated velocity. LEFT VERTEBRAL ARTERY:  Antegrade. 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 Vertebral arteries are antegrade in flow. Electronically Signed   By: Jolaine Click M.D.   On: 12/03/2017 12:30   Scheduled Meds: .  stroke: mapping our early stages of recovery book   Does not apply Once  . aspirin  300 mg Rectal Daily   Or  . aspirin  325 mg Oral Daily  . atorvastatin  80 mg Oral q1800  . clopidogrel  75 mg Oral Q breakfast  . enoxaparin (LOVENOX) injection  70 mg Subcutaneous Q24H  . fenofibrate  160 mg Oral Daily  . FLUoxetine  20 mg Oral Daily  . gabapentin  100 mg Oral TID  . insulin aspart  0-15 Units Subcutaneous  TID WC  . insulin aspart  0-5 Units Subcutaneous QHS  . insulin aspart  6 Units Subcutaneous TID WC  . insulin glargine  40 Units Subcutaneous QHS  . mometasone-formoterol  2 puff Inhalation BID  . nicotine  21 mg Transdermal Daily   Continuous Infusions: . sodium chloride Stopped (12/03/17 1500)     LOS: 3 days   Time spent: 35  Deven Furia Laural Benes, MD [facility]   If 7PM-7AM, please contact night-coverage [email or web address] Password [email or web address] 12/05/2017, 10:01 AM

## 2017-12-05 NOTE — Progress Notes (Signed)
[place] A. Merlene Laughter, MD     [email or web address]          DIONDRE PULIS is an 52 y.o. male.   Assessment/Plan:  1. 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  Dual antiplatelet agents are recommended.  Again physical and occupational treatments are also recommend.  It appears that the left side is gotten weaker.    2.   Obstructive sleep apnea syndrome on treated:  Outpatient testing is recommended.  AutoPap  will be given while the patient is hospitalized.  3.  Severe dyslipidemia:  The patient statin should be maximized.  4.   Symmetric tremors of unclear etiology: The description seems consistent with metabolic toxic tremors due to medication or other toxic etiologies.  5.   Encephalopathy due to medication effect because of electrode behavior.    6.   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  His brother is in the room with him which seems to be helping.      GENERAL:  This is a pleasant obese male who is in no acute distress.  HEENT:   The patient has a very large neck and large tongue with crowded posterior airspace.  Neck is supple.  ABDOMEN: Soft  EXTREMITIES: There is brown in discoloration of the distal legs with trace ankle edema.   There is significant pain on range of motion involving the left shoulder and left hip.  BACK: Normal alignment.  SKIN: Normal by inspection.    MENTAL STATUS:  He is awake and alert.  He is lucid and coherent.  CRANIAL NERVES: Pupils are equal, round and reactive to light and accommodation; extraocular movements are full, there is no significant nystagmus; upper and lower facial muscles are normal in strength and symmetric, there is  no flattening of the nasolabial folds; tongue is midline; uvula is midline; shoulder elevation is normal.  MOTOR:  Left upper extremity 1/5 in the left leg 2/5.   The right side shows normal tone, bulk and strength.  COORDINATION: Left finger to nose shows significant dysmetria, right finger to nose is normal, No rest tremor; no intention tremor; no postural tremor; no bradykinesia.     EEG mild to moderate slowing but unremarkable otherwise.   Objective: Vital signs in last 24 hours: Temp:  [97.4 F (36.3 C)-98.9 F (37.2 C)] 98.2 F (36.8 C) (06/26 1000) Pulse Rate:  [63-79] 72 (06/26 1000) Resp:  [16-20] 18 (06/26 1000) BP: (112-137)/(67-82) 122/80 (06/26 1000) SpO2:  [93 %-97 %] 94 % (06/26 1000)  Intake/Output from previous day: 06/25 0701 - 06/26 0700 In: 240 [P.O.:120; I.V.:120] Out: 550 [Urine:550] Intake/Output this shift: Total I/O In: 240 [P.O.:240] Out: -  Nutritional status:  Diet Order           Diet renal/carb modified with fluid restriction Fluid restriction: 2000 mL Fluid; Room service appropriate? Yes; Fluid consistency: Thin  Diet effective now           Lab Results: Results for orders placed or performed during the hospital encounter of 12/02/17 (from the past 48 hour(s))  Glucose, capillary     Status: Abnormal   Collection Time: 12/03/17  6:08 PM  Result Value Ref Range   Glucose-Capillary  225 (H) 65 - 99 mg/dL   Comment 1 Notify RN    Comment 2 Document in Chart   Glucose, capillary     Status: Abnormal   Collection Time: 12/04/17 12:03 AM  Result Value Ref Range   Glucose-Capillary 161 (H) 70 - 99 mg/dL   Comment 1 Notify RN    Comment 2 Document in Chart   Glucose, capillary     Status: Abnormal   Collection Time: 12/04/17  5:49 AM  Result Value Ref Range   Glucose-Capillary 263 (H) 70 - 99 mg/dL  CBC     Status: Abnormal   Collection Time: 12/04/17  6:47 AM  Result Value Ref Range   WBC 10.6 (H) 4.0 - 10.5 K/uL   RBC 4.83 4.22  - 5.81 MIL/uL   Hemoglobin 14.6 13.0 - 17.0 g/dL   HCT 41.7 39.0 - 52.0 %   MCV 86.3 78.0 - 100.0 fL   MCH 30.2 26.0 - 34.0 pg   MCHC 35.0 30.0 - 36.0 g/dL   RDW 13.4 11.5 - 15.5 %   Platelets 112 (L) 150 - 400 K/uL    Comment: PLATELET COUNT CONFIRMED BY SMEAR Performed at [facility], [address], [place], [place] [address]   Basic metabolic panel     Status: Abnormal   Collection Time: 12/04/17  6:47 AM  Result Value Ref Range   Sodium 126 (L) 135 - 145 mmol/L   Potassium 3.8 3.5 - 5.1 mmol/L   Chloride 92 (L) 98 - 111 mmol/L   CO2 24 22 - 32 mmol/L   Glucose, Bld 234 (H) 70 - 99 mg/dL   BUN 9 6 - 20 mg/dL   Creatinine, Ser 0.73 0.61 - 1.24 mg/dL   Calcium 8.3 (L) 8.9 - 10.3 mg/dL   GFR calc non Af Amer >60 >60 mL/min   GFR calc Af Amer >60 >60 mL/min    Comment: (NOTE) The eGFR has been calculated using the CKD EPI equation. This calculation has not been validated in all clinical situations. eGFR's persistently <60 mL/min signify possible Chronic Kidney Disease.    Anion gap 10 5 - 15    Comment: Performed at [facility], [address] [place], [place] [address]  Magnesium     Status: Abnormal   Collection Time: 12/04/17  6:47 AM  Result Value Ref Range   Magnesium 1.5 (L) 1.7 - 2.4 mg/dL    Comment: Performed at [facility], [address], [place], [place] [address]  Glucose, capillary     Status: Abnormal   Collection Time: 12/04/17 11:45 AM  Result Value Ref Range   Glucose-Capillary 234 (H) 70 - 99 mg/dL   Comment 1 Notify RN    Comment 2 Document in Chart   Glucose, capillary     Status: Abnormal   Collection Time: 12/04/17  4:21 PM  Result Value Ref Range   Glucose-Capillary 200 (H) 70 - 99 mg/dL   Comment 1 Notify RN    Comment 2 Document in Chart   Glucose, capillary     Status: Abnormal   Collection Time: 12/04/17  9:59 PM  Result Value Ref Range   Glucose-Capillary 188 (H) 70 - 99 mg/dL   Comment 1 Notify RN   Glucose, capillary     Status:  Abnormal   Collection Time: 12/05/17  4:04 AM  Result Value Ref Range   Glucose-Capillary 158 (H) 70 - 99 mg/dL  Glucose, capillary     Status: Abnormal  Collection Time: 12/05/17  7:29 AM  Result Value Ref Range   Glucose-Capillary 158 (H) 70 - 99 mg/dL   Comment 1 Notify RN    Comment 2 Document in Chart   Glucose, capillary     Status: Abnormal   Collection Time: 12/05/17 12:51 PM  Result Value Ref Range   Glucose-Capillary 155 (H) 70 - 99 mg/dL   Comment 1 Notify RN    Comment 2 Document in Chart   Glucose, capillary     Status: Abnormal   Collection Time: 12/05/17  4:07 PM  Result Value Ref Range   Glucose-Capillary 201 (H) 70 - 99 mg/dL   Comment 1 Notify RN    Comment 2 Document in Chart     Lipid Panel Recent Labs    12/03/17 0601  CHOL 279*  TRIG 1,256*  HDL NOT REPORTED DUE TO HIGH TRIGLYCERIDES  CHOLHDL NOT REPORTED DUE TO HIGH TRIGLYCERIDES  VLDL UNABLE TO CALCULATE IF TRIGLYCERIDE OVER 400 mg/dL  LDLCALC UNABLE TO CALCULATE IF TRIGLYCERIDE OVER 400 mg/dL    Studies/Results:  CAROTID DOPPLERS RIGHT  ICA:  Occluded cm/sec  CCA:  52 cm/sec  SYSTOLIC ICA/CCA RATIO:  Occluded  DIASTOLIC ICA/CCA RATIO:  Occluded  ECA:  179 cm/sec  LEFT  ICA:  405 cm/sec  CCA:  78 cm/sec  SYSTOLIC ICA/CCA RATIO:  5.2  DIASTOLIC ICA/CCA RATIO:  ECA:  124 cm/sec  RIGHT CAROTID ARTERY: The internal carotid is occluded just above the bulb. There is significant irregular plaque at the origin of the external carotid artery.  RIGHT VERTEBRAL ARTERY:  Antegrade.  LEFT CAROTID ARTERY: There is mild smooth soft plaque in the lower common carotid artery. There is extensive predominately soft plaque in the bulb. Low resistance internal carotid Doppler pattern is preserved with a markedly elevated velocity.  LEFT VERTEBRAL ARTERY:  Antegrade.  IMPRESSION: Right internal carotid artery occlusion.  Greater than 70% stenosis in the left internal  carotid artery.  Vertebral arteries are antegrade in flow.      BRAIN MRA MRI FINDINGS: MRI HEAD FINDINGS  Brain: Patchy areas of infarct in the right MCA territory show restricted diffusion and patchy enhancement compatible with subacute infarct. This involves the posterior insula on the right extending into the right frontal and parietal lobe. No associated hemorrhage mass, effect or midline shift. Ventricle size mildly prominent due to atrophy.  Occipital craniotomy. No evidence of recurrent tumor. Mild encephalomalacia right cerebellum appears chronic.  Vascular: Normal arterial flow voids  Skull and upper cervical spine: Occipital craniotomy. No acute skeletal abnormality  Sinuses/Orbits: Paranasal sinuses clear.  Bilateral cataract surgery  Other: None  MRA HEAD FINDINGS  Occlusion of the right internal carotid artery through the cavernous segment with reconstitution of the supraclinoid internal carotid artery on the right. Right middle cerebral artery patent with moderate stenosis at the right MCA bifurcation extending into the M2 branches. Right anterior cerebral artery widely patent  Left internal carotid artery widely patent. Moderate to severe stenosis origin of left A1 segment. Otherwise both anterior middle cerebral arteries are patent without stenosis.  Both vertebral arteries patent to the basilar. Basilar widely patent. Superior cerebellar and posterior cerebral arteries patent bilaterally.  IMPRESSION: Patchy areas of subacute infarct in the right posterior MCA territory. These show restricted diffusion and enhancement.  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 Moderate stenosis right MCA bifurcation extending into the M2 branches. Severe stenosis left A1 segment.  The brain MRI and MRA reviewed in person.  There are multiple increased signal seen on DWI involving the parasagittal region  on the right side essentially approximating watershed distribution.  There is also similar findings involving the insular cortex and frontal cortex along with the surrounding subcortical structures again on the right side.    There is mild global atrophy.  No hemorrhage is appreciated. MRA shows absent right ICA.  There is also absent A1 on the right which reconstitute along the A2 segment.  There is luminal irregularity of the distal  M1 segment and distal to this.    Medications:  Scheduled Meds: .  stroke: mapping our early stages of recovery book   Does not apply Once  . aspirin  300 mg Rectal Daily   Or  . aspirin  325 mg Oral Daily  . atorvastatin  80 mg Oral q1800  . clopidogrel  75 mg Oral Q breakfast  . enoxaparin (LOVENOX) injection  70 mg Subcutaneous Q24H  . fenofibrate  160 mg Oral Daily  . FLUoxetine  20 mg Oral Daily  . gabapentin  100 mg Oral TID  . insulin aspart  0-15 Units Subcutaneous TID WC  . insulin aspart  0-5 Units Subcutaneous QHS  . insulin aspart  6 Units Subcutaneous TID WC  . insulin glargine  40 Units Subcutaneous QHS  . mometasone-formoterol  2 puff Inhalation BID  . nicotine  21 mg Transdermal Daily   Continuous Infusions: . sodium chloride Stopped (12/03/17 1500)   PRN Meds:.acetaminophen **OR** acetaminophen (TYLENOL) oral liquid 160 mg/5 mL **OR** acetaminophen, LORazepam, QUEtiapine, senna-docusate, traMADol     LOS: 3 days   Kyara Boxer A. Merlene Laughter, M.D.  Diplomate, Tax adviser of Psychiatry and Neurology ( Neurology).

## 2017-12-05 NOTE — Progress Notes (Signed)
Patient and family member insisted patient get up to bedside commode. Patient unable to feel this nurse pressing down on left side. Patient and daughter very impulsive. PT, this nurse, tech and another nurse assisted patient on hoyer to bedside commode, once on commode unable to sit up, leans completely to the left side and slides. Patient was assisted back to bed and put on bedpan.

## 2017-12-05 NOTE — Evaluation (Signed)
Clinical/Bedside Swallow Evaluation Patient Details  Name: COREON SIMKINS MRN: [id number] Date of Birth: 1965/10/03  Today's Date: 12/05/2017 Time: SLP Start Time (ACUTE ONLY): 1621 SLP Stop Time (ACUTE ONLY): 1657 SLP Time Calculation (min) (ACUTE ONLY): 36 min  Past Medical History:  Past Medical History:  Diagnosis Date  . Arthritis   . COPD (chronic obstructive pulmonary disease) (HCC)   . Diabetes mellitus without complication (HCC)   . DVT (deep venous thrombosis) (HCC)   . Hypertension   . Seizures (HCC)    Past Surgical History:  Past Surgical History:  Procedure Laterality Date  . BRAIN SURGERY    . BRAIN TUMOR EXCISION     performed at [facility]  . CATARACT EXTRACTION     HPI:  Coburn Knaus  is a 52 y.o. male, with history of diabetes mellitus, tobacco abuse, history of DVT in the past, hypertension who has been noncompliant with his medications and has not taken any medication for past 6 months came to hospital with left arm numbness for past 3 days.  As per patient's wife he was having difficulty walking downstairs case and with difficulty using his left hand to grab onto rolling as he descended the stairs.  Patient also had difficulty with speech.  Speech was slurred.    Assessment / Plan / Recommendation Clinical Impression  Clinical swallowing evaluation completed with pt seated upright in bed. Oral motor exam revealed decreased L facial and lingual ROM and strength. Pt consumed thin liquids with no overt s/sx of aspiration however family reports occasional coughing when liquids are administered via straw. Pt consumed puree and regular textures with noted prolonged mastication and mild L sulcus pocketing cleared with lingual sweep. Recommend continue with regular diet and thin liquids and utilize strategies: alternate bites/sips, small bites and sips, slow rate and lingual sweep. SLP will continue to follow while in acute setting. SLP Visit Diagnosis: Dysphagia,  unspecified (R13.10)    Aspiration Risk  Mild aspiration risk    Diet Recommendation Regular;Thin liquid   Liquid Administration via: Cup;Straw Medication Administration: Whole meds with liquid Supervision: Patient able to self feed;Intermittent supervision to cue for compensatory strategies Compensations: Minimize environmental distractions;Slow rate;Small sips/bites;Follow solids with liquid;Clear throat intermittently Postural Changes: Seated upright at 90 degrees;Remain upright for at least 30 minutes after po intake    Other  Recommendations Oral Care Recommendations: Oral care BID   Follow up Recommendations Inpatient Rehab;Skilled Nursing facility;24 hour supervision/assistance      Frequency and Duration min 1 x/week  1 week       Prognosis        Swallow Study   General HPI: Oshen Wlodarczyk  is a 52 y.o. male, with history of diabetes mellitus, tobacco abuse, history of DVT in the past, hypertension who has been noncompliant with his medications and has not taken any medication for past 6 months came to hospital with left arm numbness for past 3 days.  As per patient's wife he was having difficulty walking downstairs case and with difficulty using his left hand to grab onto rolling as he descended the stairs.  Patient also had difficulty with speech.  Speech was slurred.  Type of Study: Bedside Swallow Evaluation Previous Swallow Assessment: none Diet Prior to this Study: Regular Temperature Spikes Noted: No Respiratory Status: Room air History of Recent Intubation: No Behavior/Cognition: Cooperative;Pleasant mood;Lethargic/Drowsy Oral Cavity Assessment: Dry Oral Care Completed by SLP: Yes Oral Cavity - Dentition: Missing dentition Vision: Functional for self-feeding Self-Feeding Abilities: Able  to feed self Patient Positioning: Upright in bed Baseline Vocal Quality: Normal Volitional Cough: Strong Volitional Swallow: Able to elicit    Oral/Motor/Sensory Function  Overall Oral Motor/Sensory Function: Moderate impairment Facial ROM: Reduced left Facial Symmetry: Abnormal symmetry left Facial Strength: Reduced left Facial Sensation: Reduced left Lingual ROM: Reduced left Lingual Symmetry: Abnormal symmetry left Lingual Strength: Reduced Lingual Sensation: Within Functional Limits   Ice Chips Ice chips: Within functional limits   Thin Liquid Thin Liquid: Within functional limits    Nectar Thick     Honey Thick     Puree Puree: Within functional limits   Solid          Jalani Cullifer H. Romie LeveeYarbrough MA, CCC-SLP Speech Language Pathologist'   Solid: Impaired Presentation: Self Fed Oral Phase Impairments: Reduced lingual movement/coordination;Impaired mastication Oral Phase Functional Implications: Left lateral sulci pocketing        Rayann HemanAmelia H Bayle Calvo 12/05/2017,5:13 PM

## 2017-12-05 NOTE — Procedures (Signed)
[facility] Zachary Deleon A. Zachary Pilgrim, MD     [email or web address]           HISTORY: This is a 52 year old who presents with altered mental status.  There is a baseline history of seizures.  The studies been done to evaluate for seizures as the etiology of the persistent encephalopathy.  MEDICATIONS: Scheduled Meds: .  stroke: mapping our early stages of recovery book   Does not apply Once  . aspirin  300 mg Rectal Daily   Or  . aspirin  325 mg Oral Daily  . atorvastatin  80 mg Oral q1800  . clopidogrel  75 mg Oral Q breakfast  . enoxaparin (LOVENOX) injection  70 mg Subcutaneous Q24H  . fenofibrate  160 mg Oral Daily  . FLUoxetine  20 mg Oral Daily  . gabapentin  100 mg Oral TID  . insulin aspart  0-15 Units Subcutaneous TID WC  . insulin aspart  0-5 Units Subcutaneous QHS  . insulin aspart  6 Units Subcutaneous TID WC  . insulin glargine  40 Units Subcutaneous QHS  . mometasone-formoterol  2 puff Inhalation BID  . nicotine  21 mg Transdermal Daily   Continuous Infusions: . sodium chloride Stopped (12/03/17 1500)   PRN Meds:.acetaminophen **OR** acetaminophen (TYLENOL) oral liquid 160 mg/5 mL **OR** acetaminophen, LORazepam, QUEtiapine, senna-docusate, traMADol  Prior to Admission medications   Medication Sig Start Date End Date Taking? Authorizing Provider  albuterol (VENTOLIN HFA) 108 (90 Base) MCG/ACT inhaler Inhale 2 puffs into the lungs every 4 (four) hours as needed for wheezing or shortness of breath.   Yes [provider]  amLODipine (NORVASC) 10 MG tablet Take 10 mg by mouth daily.    [provider]  apixaban (ELIQUIS) 5 MG TABS tablet Take 5 mg by mouth 2 (two) times daily.    [provider]  atorvastatin (LIPITOR) 20 MG tablet Take 20 mg by mouth daily.    [provider]  cilostazol (PLETAL) 100 MG tablet Take 100 mg by mouth 2 (two) times daily.    [provider]  FLUoxetine (PROZAC) 20 MG capsule Take 20 mg  by mouth daily.    [provider]  Fluticasone-Salmeterol (ADVAIR) 250-50 MCG/DOSE AEPB Inhale 1 puff into the lungs 2 (two) times daily.    [provider]  gabapentin (NEURONTIN) 100 MG capsule Take 100 mg by mouth 3 (three) times daily.    [provider]  gemfibrozil (LOPID) 600 MG tablet Take 600 mg by mouth 2 (two) times daily before a meal.    [provider]  Insulin Glargine (LANTUS SOLOSTAR) 100 UNIT/ML Solostar Pen Inject 50 Units into the skin 2 (two) times daily.    [provider]  lisinopril-hydrochlorothiazide (PRINZIDE,ZESTORETIC) 20-12.5 MG tablet Take 1 tablet by mouth daily.    [provider]  metFORMIN (GLUCOPHAGE) 1000 MG tablet Take 1,000 mg by mouth 2 (two) times daily with a meal.    [provider]  tiotropium (SPIRIVA) 18 MCG inhalation capsule Place 18 mcg into inhaler and inhale daily.    [provider]      ANALYSIS: A 16 channel recording using standard 10 20 measurements is conducted for 21 minutes.  The study is conducted approximately 1 hour after the administration of Ativan because of irritability.  The background activity is replete with a theta slowing approximately 5 Hz.  There is a brief higher activity of 7 Hz.  There is beta activity during the frontal areas.  Spindles  and K complexes are observed.  There is no focal or lateralized slowing.  There is no epileptiform activity is observed.   IMPRESSION: 1.  This recording of the awake and sleep states shows mild global slowing indicating a mild global encephalopathy.  However, there is no evidence of epileptiform discharges.      Zachary Bralley A. Zachary Deleon, M.D.  Diplomate, Biomedical engineerAmerican Board of Psychiatry and Neurology ( Neurology).

## 2017-12-05 NOTE — Progress Notes (Signed)
Physical Therapy Treatment Patient Details Name: Zachary Deleon MRN: [id number] DOB: February 12, 1966 Today's Date: 12/05/2017    History of Present Illness Zachary Deleon  is a 52 y.o. male, with history of diabetes mellitus, tobacco abuse, history of DVT in the past, hypertension who has been noncompliant with his medications and has not taken any medication for past 6 months came to hospital with left arm numbness for past 3 days.  As per patient's wife he was having difficulty walking downstairs case and with difficulty using his left hand to grab onto rolling as he descended the stairs.  Patient also had difficulty with speech.  Speech was slurred.    PT Comments    [facility], pt unable to complete bowel movement in position and was transferred back to bed with bed pan trying to complete movements.  Therapy session completed after completion and cleansed.  Pt supine in bed and willing to participate with therapy today.  Family reports improved speech, noted increased difficulty with clear speech with fatigue and possibly due to medication given by RN during session.  Pt able to follow all instructions with minimal difficulty today. Pt instructed to assist Lt UE with Rt UE during bed mobilty.  Max A with Lt LE due to weakness.  Max A with bed mobility to sitting on EOB.  Pt able to sit up x 5 minutes with fair seated balance, some cueing to reduce lean to Lt with fatigue requiring min A.  AROM exercises complete for strengthening Rt LE and PROM with Lt LE due to weakness.  EOS pt left in bed with bed alarm set, call bell within reach and family in room.  RN aware of status.  D[facility], pt too fatigued to complete at EOS.    Follow Up Recommendations  SNF     Equipment Recommendations  Rolling walker with 5" wheels    Recommendations for Other Services       Precautions / Restrictions  Precautions Precautions: Fall Precaution Comments: impulsive, weak LE's and inability to stand Restrictions Weight Bearing Restrictions: No    Mobility  Bed Mobility Overal bed mobility: Needs Assistance Bed Mobility: Sidelying to Sit;Sit to Supine   Sidelying to sit: Max assist(Instructed assistance with Rt UE with Lt UE, max assistance with Lt LE due to weakness)   Sit to supine: Max assist(Instructed assistance with Rt UE with Lt UE, max assistance with Lt LE due to weakness)   General bed mobility comments: Improved cognition this session, family reports improved speech during session.  Pt followed instructions consistently.  Instructed assistance wiht Rt UE with Lt UE due to weakness, max A with Lt LE due to weakness  Transfers Overall transfer level: Needs assistance               General transfer comment: [facility]  Transfered back to bed as unable to complete bowel movement.  Pt left in bed following therapy as fatigued due to activity as well as medication given to pt during session.  Did discuss Hoyer lift transfer to chair with RN later today when move awake.    Ambulation/Gait                 Stairs             Wheelchair Mobility    Modified Rankin (Stroke Patients Only)  Balance Overall balance assessment: Needs assistance Sitting-balance support: Feet supported;Bilateral upper extremity supported Sitting balance-Leahy Scale: Fair   Postural control: Left lateral lean Standing balance support: Bilateral upper extremity supported;During functional activity Standing balance-Leahy Scale: Fair                              Cognition Arousal/Alertness: Awake/alert Behavior During Therapy: WFL for tasks assessed/performed;Impulsive Overall Cognitive Status: History of cognitive impairments - at baseline                                 General Comments: improved awareness of safety  and fear of falling with bed mobilty without handrails up      Exercises General Exercises - Lower Extremity Ankle Circles/Pumps: PROM;Left;AROM;Right;10 reps Gluteal Sets: AROM;Both;10 reps Long Arc Quad: AROM;Right;10 reps;Left;PROM Heel Slides: PROM;Left;AROM;Right;10 reps Other Exercises Other Exercises: PROM SKTC and ankle movements Lt LE; AROM with Rt LE Other Exercises: 5 min seated balance activities.  Verbal and tactile cueing (min A) to reduce lean to left    General Comments        Pertinent Vitals/Pain Pain Score: 5  Pain Location: low back Pain Descriptors / Indicators: Aching Pain Intervention(s): Limited activity within patient's tolerance;Monitored during session;Repositioned;RN gave pain meds during session    Home Living                      Prior Function            PT Goals (current goals can now be found in the care plan section)      Frequency    7X/week      PT Plan      Co-evaluation              AM-PAC PT "6 Clicks" Daily Activity  Outcome Measure  Difficulty turning over in bed (including adjusting bedclothes, sheets and blankets)?: A Little Difficulty moving from lying on back to sitting on the side of the bed? : A Lot Difficulty sitting down on and standing up from a chair with arms (e.g., wheelchair, bedside commode, etc,.)?: A Lot Help needed moving to and from a bed to chair (including a wheelchair)?: A Lot Help needed walking in hospital room?: Total Help needed climbing 3-5 steps with a railing? : Total 6 Click Score: 11    End of Session Equipment Utilized During Treatment: Gait belt Activity Tolerance: Patient tolerated treatment well;Patient limited by fatigue Patient left: in bed;with bed alarm set;with family/visitor present;with call bell/phone within reach Nurse Communication: Mobility status(RN aware of status; discussed transfer later with [facility] to chair when move awake) PT Visit Diagnosis:  Unsteadiness on feet (R26.81);Other abnormalities of gait and mobility (R26.89);Muscle weakness (generalized) (M62.81)     Time: 1010-1044 PT Time Calculation (min) (ACUTE ONLY): 34 min  Charges:  $Therapeutic Activity: 23-37 mins                    G Codes:       Becky Sax, LPTA; CBIS [phone]  Juel Burrow 12/05/2017, 11:31 AM

## 2017-12-05 NOTE — Progress Notes (Signed)
OT Cancellation Note  Patient Details Name: Zachary Deleon MRN: [id number] DOB: March 29, 1966   Cancelled Treatment:    Reason Eval/Treat Not Completed: Other (comment). Attempted to see pt this am, daughter present. Pt sitting up in bed eating breakfast on OT arrival. Pt immediately requested for OT to find nsg as he wanted to use the bedside commode. OT discussed with nsg staff, RN spoke with pt explaining safety concerns with use of BSC and encouraged pt to try bedpan. OT assisted nsg with placing pt on bedpan. Daughter encouraging pt to use bedpan, acknowledging safety issues with mobility at this time. Pt agreeable to attempt bedpan, OT will attempt evaluation again at another time.    [name], OTR/L  [phone] 12/05/2017, 10:02 AM

## 2017-12-06 LAB — GLUCOSE, CAPILLARY
GLUCOSE-CAPILLARY: 193 mg/dL — AB (ref 70–99)
Glucose-Capillary: 141 mg/dL — ABNORMAL HIGH (ref 70–99)
Glucose-Capillary: 166 mg/dL — ABNORMAL HIGH (ref 70–99)
Glucose-Capillary: 292 mg/dL — ABNORMAL HIGH (ref 70–99)
Glucose-Capillary: 233 mg/dL — ABNORMAL HIGH (ref 70–99)

## 2017-12-06 MED ORDER — BUTALBITAL-APAP-CAFFEINE 50-325-40 MG PO TABS
2.0000 | ORAL_TABLET | ORAL | Status: DC | PRN
  Administered 2017-12-06: 2 via ORAL
  Filled 2017-12-06 (×2): qty 2

## 2017-12-06 MED ORDER — LISINOPRIL-HYDROCHLOROTHIAZIDE 20-12.5 MG PO TABS
1.0000 | ORAL_TABLET | Freq: Every day | ORAL | Status: DC
Start: 2017-12-06 — End: 2017-12-21

## 2017-12-06 MED ORDER — QUETIAPINE FUMARATE 25 MG PO TABS: 25.0000 mg | ORAL_TABLET | Freq: Two times a day (BID) | ORAL | Status: DC | PRN

## 2017-12-06 MED ORDER — INSULIN GLARGINE 100 UNIT/ML SOLOSTAR PEN: 40.0000 [IU] | PEN_INJECTOR | Freq: Every day | SUBCUTANEOUS | 11 refills | Status: DC

## 2017-12-06 MED ORDER — ATORVASTATIN CALCIUM 80 MG PO TABS: 80.0000 mg | ORAL_TABLET | Freq: Every day | ORAL | Status: DC

## 2017-12-06 MED ORDER — INSULIN ASPART 100 UNIT/ML ~~LOC~~ SOLN
10.0000 [IU] | Freq: Three times a day (TID) | SUBCUTANEOUS | Status: DC
  Administered 2017-12-06 (×3): 10 [IU] via SUBCUTANEOUS

## 2017-12-06 MED ORDER — SENNOSIDES-DOCUSATE SODIUM 8.6-50 MG PO TABS: 1.0000 | ORAL_TABLET | Freq: Every day | ORAL | Status: DC

## 2017-12-06 MED ORDER — ASPIRIN 325 MG PO TABS: 325.0000 mg | ORAL_TABLET | Freq: Every day | ORAL | Status: DC

## 2017-12-06 MED ORDER — INSULIN ASPART 100 UNIT/ML ~~LOC~~ SOLN: 10.0000 [IU] | Freq: Three times a day (TID) | SUBCUTANEOUS | 11 refills | Status: DC

## 2017-12-06 MED ORDER — CLOPIDOGREL BISULFATE 75 MG PO TABS: 75.0000 mg | ORAL_TABLET | Freq: Every day | ORAL | Status: DC

## 2017-12-06 NOTE — Care Management (Signed)
PT will DC to CIR in the AM. CM has notified attending and requested transportation be scheduled for 9AM. Discussed with RN Lequita HaltMorgan.

## 2017-12-06 NOTE — Progress Notes (Signed)
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  He is diffusely weak on the left side with decreased sensation.  He also has some dysarthria. -PT/OT -Speech line which pathology - Continue atorvastatin 20 mg, will increase to 80 - Continue aspirin/plavix.  PT recommending CIR.  EEG: no epileptiform activity.  -Neurology consulted.  -Carotid ultrasound shows occlusion of right carotid and greater than 70% stenosis on left, will need referral to outpatient vascular surgery (follows up with vascular surgery at [facility], Dr. Isabell Jarvis) - Hold antihypertensives for 24 hours -Lipid panel grossly abnormal with severe hyper triglyceridemia which should improve with better glycemic control.  -Hemoglobin A1c was greater than 11 - Working to intensify treatments for better glycemic control.    #) Hypertension/hyperlipidemia/hyperglyceridemia: Permissive hypertension -Increased atorvastatin to 80 mg daily -Hold amlodipine 10 mg -Hold lisinopril-HCTZ 20-12.5 mg daily --Treating triglycerides with insulin and improved glycemic control.    #) Peripheral vascular disease: No records here however patient apparently is followed at  [facility].  Per discussion with the wife patient is not considered a surgical candidate due to his noncompliance and medical comorbidities. -Continue statin - Holding cilostazol 100 mg twice daily for now.    #) COPD, active tobacco abuse: -Counseling provided for tobacco cessation  -Continue L AMA, LA BA/ICS -Continue PRN bronchodilators  #) History of DVT: Patient is noncompliant with apixaban -Hold apixaban 5 mg twice daily  #) Type 2 diabetes on insulin: -Continue basal bolus insulin plus supplemental coverage, BS much better controlled.   #) Pain/psych: -Continue fluoxetine 20 mg daily -Continue gabapentin 100 mg 3 times daily -- With increased agitation and delirium, trial of seroquel 25 mg BID prn, lorazepam for anxiety and sedation ordered as needed  #) Class III obesity: Based on exam patient She likely has some degree of OSA and almost certainly has obesity hypoventilation syndrome. -We will need outpatient sleep study  Fluids: Tolerating p.o. Elect lites: Monitor and supplement Nutrition: Carb/heart healthy diet  Prophylaxis: Enoxaparin  Disposition: CIR recommended  DO NOT RESUSCITATE   Consultants:   Neurology  Procedures:   Echo 12/03/2017: Study Conclusions - Left ventricle: The cavity size was normal. Wall thickness was   normal. Systolic function was normal. The estimated ejection   fraction was in the range of 55% to 60%. Wall motion was normal;   there were no regional wall motion abnormalities. Left   ventricular diastolic function parameters were normal. - Aortic valve: Mildly calcified annulus. Trileaflet; normal   thickness leaflets. Valve area (VTI): 1.98 cm^2. Valve area   (Vmax): 1.7 cm^2. - Left atrium: The atrium was mildly dilated. - Technically adequate study.  Subjective: Pt reporting that he needs to have a bowel movement.    Objective: Vitals:   12/05/17 2200 12/06/17 0200 12/06/17  6578 12/06/17 0731  BP: (!)  149/89 (!) 120/57 (!) 127/54   Pulse: 95 96 79   Resp: 18 17 18    Temp: 98.4 F (36.9 C) 98.4 F (36.9 C) 97.6 F (36.4 C)   TempSrc: Oral Oral Oral   SpO2: 96% 96% 96% 95%  Weight:      Height:        Intake/Output Summary (Last 24 hours) at 12/06/2017 0911 Last data filed at 12/05/2017 2100 Gross per 24 hour  Intake 240 ml  Output 500 ml  Net -260 ml   Filed Weights   12/02/17 1941 12/03/17 0813  Weight: 136.1 kg (300 lb) 116.3 kg (256 lb 6.3 oz)   Examination:  General exam: awake, alert, speaking better, NAD.    Respiratory system: No increased work of breathing, scattered rhonchi and intermittent wheezes, no crackles Cardiovascular system: Distant heart sounds, regular rate and rhythm, no murmurs Gastrointestinal system: Soft, nondistended, no rebound or guarding, normal BS.  Central nervous system: Alert and oriented.  Clear speech patterns,  Pronounced left hemiparesis.  Extremities: no cyanosis or clubbing.  Skin: No rashes on visible skin Psychiatry: Judgement and insight appear poor l. Mood & affect appropriate.   Data Reviewed: I have personally reviewed following labs and imaging studies  CBC: Recent Labs  Lab 12/02/17 1954 12/02/17 2016 12/04/17 0647  WBC 7.9  --  10.6*  NEUTROABS 4.6  --   --   HGB 17.3* 16.3 14.6  HCT 47.7 48.0 41.7  MCV 86.7  --  86.3  PLT 139*  --  112*   Basic Metabolic Panel: Recent Labs  Lab 12/02/17 1954 12/02/17 2016 12/04/17 0647  NA 127* 131* 126*  K 3.9 4.1 3.8  CL 94* 100* 92*  CO2 22  --  24  GLUCOSE 378* 390* 234*  BUN 12 12 9   CREATININE 0.77 0.70 0.73  CALCIUM 8.9  --  8.3*  MG  --   --  1.5*   GFR: Estimated Creatinine Clearance: 144.7 mL/min (by C-G formula based on SCr of 0.73 mg/dL). Liver Function Tests: Recent Labs  Lab 12/02/17 1954  AST 19  ALT 23  ALKPHOS 76  BILITOT 0.5  PROT 6.4*  ALBUMIN 3.9   No results for input(s): LIPASE, AMYLASE in the last 168 hours. No results for input(s):  AMMONIA in the last 168 hours. Coagulation Profile: Recent Labs  Lab 12/02/17 1954  INR 0.85   Cardiac Enzymes: No results for input(s): CKTOTAL, CKMB, CKMBINDEX, TROPONINI in the last 168 hours. BNP (last 3 results) No results for input(s): PROBNP in the last 8760 hours. HbA1C: No results for input(s): HGBA1C in the last 72 hours. CBG: Recent Labs  Lab 12/05/17 1607 12/05/17 1918 12/05/17 2046 12/06/17 0211 12/06/17 0732  GLUCAP 201* 244* 252* 141* 193*   Lipid Profile: No results for input(s): CHOL, HDL, LDLCALC, TRIG, CHOLHDL, LDLDIRECT in the last 72 hours. Thyroid Function Tests: No results for input(s): TSH, T4TOTAL, FREET4, T3FREE, THYROIDAB in the last 72 hours. Anemia Panel: No results for input(s): VITAMINB12, FOLATE, FERRITIN, TIBC, IRON, RETICCTPCT in the last 72 hours. Sepsis Labs: No results for input(s): PROCALCITON, LATICACIDVEN in the last 168 hours.  No results found for this or any previous visit (from the past 240 hour(s)).   Radiology Studies: No results found. Scheduled Meds: .  stroke: mapping our early stages of recovery book   Does not apply Once  . aspirin  300 mg Rectal Daily   Or  .  aspirin  325 mg Oral Daily  . atorvastatin  80 mg Oral q1800  . clopidogrel  75 mg Oral Q breakfast  . enoxaparin (LOVENOX) injection  70 mg Subcutaneous Q24H  . fenofibrate  160 mg Oral Daily  . FLUoxetine  20 mg Oral Daily  . gabapentin  100 mg Oral TID  . insulin aspart  0-15 Units Subcutaneous TID WC  . insulin aspart  0-5 Units Subcutaneous QHS  . insulin aspart  10 Units Subcutaneous TID WC  . insulin glargine  45 Units Subcutaneous QHS  . mometasone-formoterol  2 puff Inhalation BID  . nicotine  21 mg Transdermal Daily   Continuous Infusions: . sodium chloride Stopped (12/03/17 1500)     LOS: 4 days   Time spent: 32 mins  Derrius Furtick, MD [facility]   If 7PM-7AM, please contact night-coverage [email or web address] Password  [email or web address] 12/06/2017, 9:11 AM

## 2017-12-06 NOTE — Progress Notes (Signed)
Inpatient Rehabilitation Admissions Coordinator  [facility] I reviewed chart and then spoke with pt's wife via phone. We dicussed goals and expectations of an inpt rehab admit and she prefers CIR rather than SNF. We have bed available tomorrow and can admit. I discussed with Shanda BumpsJessica, RN and she will arrange pickup 9 am tomorrow  For Rehab admitting MD , Dr. Riley KillSwartz, to room [id number]. Jeani HawkingAnnie Penn RN can call report to Rehab RN at [phone] before transport out in the am.   Ottie GlazierBarbara Boe Deans, RN, MSN Rehab Admissions Coordinator [phone] 12/06/2017 2:37 PM

## 2017-12-06 NOTE — Plan of Care (Signed)
  Problem: Acute Rehab OT Goals (only OT should resolve) Goal: Pt. Will Perform Grooming Flowsheets (Taken 12/06/2017 0918) Pt Will Perform Grooming: with modified independence;standing Note:  Using LUE as assist Goal: Pt. Will Perform Upper Body Dressing Flowsheets (Taken 12/06/2017 0918) Pt Will Perform Upper Body Dressing: with modified independence;with supervision;with min assist;sitting Goal: Pt. Will Perform Lower Body Dressing Flowsheets (Taken 12/06/2017 0918) Pt Will Perform Lower Body Dressing: with min assist;sitting/lateral leans Goal: Pt. Will Transfer To Toilet Flowsheets (Taken 12/06/2017 [phone]) Pt Will Transfer to Toilet: with min assist;with mod assist;stand pivot transfer;regular height toilet;bedside commode;ambulating Goal: Pt. Will Perform Toileting-Clothing Manipulation Flowsheets (Taken 12/06/2017 0918) Pt Will Perform Toileting - Clothing Manipulation and hygiene: with min guard assist;with supervision;sitting/lateral leans;sit to/from stand Goal: Pt/Caregiver Will Perform Home Exercise Program Flowsheets (Taken 12/06/2017 [phone]) Pt/caregiver will Perform Home Exercise Program: Increased strength;Left upper extremity;With theraband;Independently;With written HEP provided

## 2017-12-06 NOTE — H&P (Signed)
Physical Medicine and Rehabilitation Admission H&P    Chief Complaint  Patient presents with  . Numbness  : HPI: Zachary Deleon is a 52 year old right-handed male with history of COPD/tobacco abuse, carotid stenosis followed by vascular surgery [facility] Dr. Isabell Jarvis, diabetes mellitus, DVT maintained on Eliquis in the past, hypertension, brain tumor with excision 20 years ago and medical noncompliance as he has not taken his medicines for the past 6 months.  Patient lives with his wife.  Used a straight point cane prior to admission.  Wife can assist as needed.  One level home with 3 steps to entry.  Presented to [facility] 12/02/2017 with left-sided weakness and slurred speech.  Urine drug screen negative.  Cranial CT scan showed postsurgical changes seen in the right cerebellar hemisphere.  No acute intracranial process.  Mild chronic ischemic white matter disease.  Patient did not receive TPA.  MRI/MRA showed patchy areas of subacute infarction in the right posterior MCA territory.  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  Moderate stenosis right MCA bifurcation extending into the M2 branches.  Carotid Doppler showed right internal carotid artery occlusion.  Greater than 70% stenosis in the left ICA.  Echocardiogram with ejection fraction of 60% no wall motion abnormalities.  EEG negative seizure.  Neurology services consulted maintained on aspirin and Plavix for CVA prophylaxis.  Subcutaneous Lovenox for DVT prophylaxis.  Tolerating a regular consistency diet.  Physical and occupational therapy evaluations completed.  Patient was admitted for a comprehensive rehab program.  Review of Systems  Constitutional: Negative for chills and fever.  HENT: Negative for hearing loss.   Eyes: Negative for blurred vision and double vision.  Respiratory: Negative for cough and shortness of breath.   Cardiovascular: Positive for leg  swelling. Negative for chest pain and palpitations.  Gastrointestinal: Positive for constipation. Negative for nausea and vomiting.  Genitourinary: Negative for dysuria, flank pain and hematuria.  Musculoskeletal: Positive for joint pain and myalgias.  Skin: Negative for rash.  Neurological: Positive for sensory change, speech change, focal weakness and headaches.  Psychiatric/Behavioral: Positive for depression.  All other systems reviewed and are negative.  Past Medical History:  Diagnosis Date  . Arthritis   . COPD (chronic obstructive pulmonary disease) (HCC)   . Diabetes mellitus without complication (HCC)   . DVT (deep venous thrombosis) (HCC)   . Hypertension   . Seizures (HCC)    Past Surgical History:  Procedure Laterality Date  . BRAIN SURGERY    . BRAIN TUMOR EXCISION     performed at [facility]  . CATARACT EXTRACTION     No family history on file. Social History:  reports that he has been smoking.  He has never used smokeless tobacco. He reports that he drinks alcohol. He reports that he has current or past drug history. Allergies:  Allergies  Allergen Reactions  . Morphine And Related Other (See Comments)    Makes pt hyper   Medications Prior to Admission  Medication Sig Dispense Refill  . albuterol (VENTOLIN HFA) 108 (90 Base) MCG/ACT inhaler Inhale 2 puffs into the lungs every 4 (four) hours as needed for wheezing or shortness of breath.    [name] amLODipine (NORVASC) 10 MG tablet Take 10 mg by mouth daily.    [name] apixaban (ELIQUIS) 5 MG TABS tablet Take 5 mg by mouth 2 (two) times daily.    [name] atorvastatin (LIPITOR) 20 MG tablet Take 20 mg by  mouth daily.    . cilostazol (PLETAL) 100 MG tablet Take 100 mg by mouth 2 (two) times daily.    [name] FLUoxetine (PROZAC) 20 MG capsule Take 20 mg by mouth daily.    . Fluticasone-Salmeterol (ADVAIR) 250-50 MCG/DOSE AEPB Inhale 1 puff into the lungs 2 (two) times daily.    [name] gabapentin (NEURONTIN) 100 MG capsule Take 100 mg by mouth 3  (three) times daily.    [name] gemfibrozil (LOPID) 600 MG tablet Take 600 mg by mouth 2 (two) times daily before a meal.    . Insulin Glargine (LANTUS SOLOSTAR) 100 UNIT/ML Solostar Pen Inject 50 Units into the skin 2 (two) times daily.    [name] lisinopril-hydrochlorothiazide (PRINZIDE,ZESTORETIC) 20-12.5 MG tablet Take 1 tablet by mouth daily.    . metFORMIN (GLUCOPHAGE) 1000 MG tablet Take 1,000 mg by mouth 2 (two) times daily with a meal.    . tiotropium (SPIRIVA) 18 MCG inhalation capsule Place 18 mcg into inhaler and inhale daily.      Drug Regimen Review Drug regimen was reviewed and remains appropriate with no significant issues identified  Home: Home Living Family/patient expects to be discharged to:: Private residence Living Arrangements: Spouse/significant other Available Help at Discharge: Family Type of Home: House Home Access: Stairs to enter Secretary/administrator of Steps: 3 Entrance Stairs-Rails: None Home Layout: One level, Multi-level Alternate Level Stairs-Number of Steps: 10-15 steps to basement Alternate Level Stairs-Rails: Left Bathroom Accessibility: Yes Home Equipment: Cane - single point  Lives With: Spouse   Functional History: Prior Function Level of Independence: Independent with assistive device(s) Comments: household and short distanced ambulator with SPC PRN  Functional Status:  Mobility: Bed Mobility Overal bed mobility: Needs Assistance Bed Mobility: Sidelying to Sit, Sit to Supine Sidelying to sit: Max assist(Instructed assistance with Rt UE with Lt UE, max assistance with Lt LE due to weakness) Supine to sit: Max assist Sit to supine: Max assist(Instructed assistance with Rt UE with Lt UE, max assistance with Lt LE due to weakness) General bed mobility comments: Pt seated at EOB on PT arrival Transfers Overall transfer level: Needs assistance Equipment used: None Transfer via Lift Equipment: Stedy Transfers: Sit to/from Stand, [employer]  Transfers Sit to Stand: Mod assist, +2 physical assistance Stand pivot transfers: Mod assist, +2 physical assistance General transfer comment: Stedy lift used for transfer from bed to [facility] and back to chair. Pt able to grasp bar and pull himself into standing, maintaining balance throughout transfer task Ambulation/Gait Ambulation/Gait assistance: Max assist, Mod assist Gait Distance (Feet): 4 Feet Assistive device: Rolling walker (2 wheeled) Gait Pattern/deviations: Decreased step length - left, Decreased stance time - left, Decreased stride length General Gait Details: limited to 4-5 side steps, 2-3 steps forward due to left side weakness with buckling and poor coordination, left hand slipping off walker due to weakness Gait velocity: slow    ADL: ADL Overall ADL's : Needs assistance/impaired Eating/Feeding: Set up, Bed level Eating/Feeding Details (indicate cue type and reason): Pt requiring assistance for opening items due to poor LUE grip strength and coordination Grooming: Set up, Sitting Grooming Details (indicate cue type and reason): Pt able to use right dominant hand for grooming  Lower Body Dressing: Total assistance Lower Body Dressing Details (indicate cue type and reason): Pt positioned in front of sara steady lift, therefore OT donning socks. However pt requiring max assist due to sitting balance deficits Toilet Transfer: Maximal assistance(Sara Steady) Toilet Transfer Details (indicate cue type and reason): Pt transferred to  BSC using [place] Steady lift Toileting- Architect and Hygiene: Maximal assistance, Sit to/from stand Toileting - Architect Details (indicate cue type and reason): Pt standing at [employer] using RUE for pulling himself forward and maintaining standing, OT completing hygiene tasks General ADL Comments: Pt requiring increased assistance for ADL completion due to LUE and LLE weakness and coordination  deficits  Cognition: Cognition Overall Cognitive Status: Within Functional Limits for tasks assessed Arousal/Alertness: Lethargic Orientation Level: Oriented to person, Oriented to place, Oriented to situation, Disoriented to time Attention: Focused Focused Attention: Impaired Executive Function: Reasoning Reasoning: Impaired Reasoning Impairment: Functional basic Cognition Arousal/Alertness: Awake/alert Behavior During Therapy: WFL for tasks assessed/performed, Impulsive Overall Cognitive Status: Within Functional Limits for tasks assessed Area of Impairment: Safety/judgement Following Commands: Follows one step commands inconsistently Safety/Judgement: Decreased awareness of safety, Decreased awareness of deficits General Comments: improved awareness of safety and fear of falling with bed mobilty without handrails up  Physical Exam: Blood pressure (!) 145/84, pulse 78, temperature 97.8 F (36.6 C), temperature source Oral, resp. rate 16, height 6\' 2"  (1.88 m), weight 116.3 kg (256 lb 6.3 oz), SpO2 94 %. Physical Exam  Constitutional: He is oriented to person, place, and time. He appears well-developed. No distress.  obese  HENT:  Head: Normocephalic and atraumatic.  Eyes: Pupils are equal, round, and reactive to light. EOM are normal.  Neck: Normal range of motion.  Cardiovascular: Normal rate and regular rhythm. Exam reveals no friction rub.  No murmur heard. Respiratory: Effort normal. No respiratory distress. He has no wheezes.  GI: Soft. Bowel sounds are normal. He exhibits distension. There is no tenderness.  Musculoskeletal:  Some pain with PROM left shoulder, early sublux  Neurological: He is alert and oriented to person, place, and time.  Dysarthric, left central 7, some tongue deviation. Cognition intact. LUE 1+ to 2- deltoid, biceps, triceps, 2/5 wrist and hand. LLE: 3/5 HF, 3-KE and 3/5 ADF/PF. RUE and RLE 5/5. Mild deficit with LT and pain left arm and leg.  Reasonable sitting balance at eob  Skin: Skin is warm and dry.  Psychiatric: He has a normal mood and affect. His behavior is normal.    Results for orders placed or performed during the hospital encounter of 12/02/17 (from the past 48 hour(s))  Glucose, capillary     Status: Abnormal   Collection Time: 12/04/17  4:21 PM  Result Value Ref Range   Glucose-Capillary 200 (H) 70 - 99 mg/dL   Comment 1 Notify RN    Comment 2 Document in Chart   Glucose, capillary     Status: Abnormal   Collection Time: 12/04/17  9:59 PM  Result Value Ref Range   Glucose-Capillary 188 (H) 70 - 99 mg/dL   Comment 1 Notify RN   Glucose, capillary     Status: Abnormal   Collection Time: 12/05/17  4:04 AM  Result Value Ref Range   Glucose-Capillary 158 (H) 70 - 99 mg/dL  Glucose, capillary     Status: Abnormal   Collection Time: 12/05/17  7:29 AM  Result Value Ref Range   Glucose-Capillary 158 (H) 70 - 99 mg/dL   Comment 1 Notify RN    Comment 2 Document in Chart   Glucose, capillary     Status: Abnormal   Collection Time: 12/05/17 12:51 PM  Result Value Ref Range   Glucose-Capillary 155 (H) 70 - 99 mg/dL   Comment 1 Notify RN    Comment 2 Document in Chart  Glucose, capillary     Status: Abnormal   Collection Time: 12/05/17  4:07 PM  Result Value Ref Range   Glucose-Capillary 201 (H) 70 - 99 mg/dL   Comment 1 Notify RN    Comment 2 Document in Chart   Glucose, capillary     Status: Abnormal   Collection Time: 12/05/17  7:18 PM  Result Value Ref Range   Glucose-Capillary 244 (H) 70 - 99 mg/dL   Comment 1 Notify RN    Comment 2 Document in Chart   Glucose, capillary     Status: Abnormal   Collection Time: 12/05/17  8:46 PM  Result Value Ref Range   Glucose-Capillary 252 (H) 70 - 99 mg/dL  Glucose, capillary     Status: Abnormal   Collection Time: 12/06/17  2:11 AM  Result Value Ref Range   Glucose-Capillary 141 (H) 70 - 99 mg/dL  Glucose, capillary     Status: Abnormal   Collection Time:  12/06/17  7:32 AM  Result Value Ref Range   Glucose-Capillary 193 (H) 70 - 99 mg/dL   Comment 1 Notify RN    Comment 2 Document in Chart   Glucose, capillary     Status: Abnormal   Collection Time: 12/06/17 11:16 AM  Result Value Ref Range   Glucose-Capillary 166 (H) 70 - 99 mg/dL   Comment 1 Notify RN    Comment 2 Document in Chart    No results found.     Medical Problem List and Plan: 1.  Left side weakness with dysarthria secondary to right MCA territory infarction as well as history of brain tumor many years ago with resection at [facility]  -admit to inpatient rehab 2.  DVT Prophylaxis/Anticoagulation: Subcutaneous Lovenox.  Monitor for any bleeding episodes 3. Pain Management: Neurontin 100 mg 3 times daily, Ultram as needed 4. Mood: Prozac 20 mg daily, Seroquel 25 mg twice daily as needed 5. Neuropsych: This patient is capable of making decisions on his own behalf. 6. Skin/Wound Care: Routine skin checks 7. Fluids/Electrolytes/Nutrition: Routine in and outs with follow-up chemistries 8.  Asymptomatic carotid stenosis.  Patient followed vascular surgery [facility] Dr.Velasquez Derrell Lolling. 9.  Hypertension.  Permissive hypertension.  Monitor with increased mobility.  Patient on Norvasc 10 mg daily, Zestoretic 20-12.5 mg daily prior to admission.  Resume as needed 10.  Diabetes mellitus with peripheral neuropathy.  Hemoglobin A1c 11.4.    -NovoLog 10 units 3 times daily, Lantus insulin 45 units daily.    -Check blood sugars before meals and at bedtime.  Diabetic teaching 11.  Tobacco abuse.  NicoDerm patch.  Counseling 12.  Hyperlipidemia.  Lipitor/fenofibrate 13.  Medical noncompliance.  Counseling 14.  Obesity.  BMI 32.92.  Dietary follow-up  Post Admission Physician Evaluation: 1. Functional deficits secondary  to right MCA territory infarct. 2. Patient is admitted to receive collaborative, interdisciplinary care between the physiatrist, rehab  nursing staff, and therapy team. 3. Patient's level of medical complexity and substantial therapy needs in context of that medical necessity cannot be provided at a lesser intensity of care such as a SNF. 4. Patient has experienced substantial functional loss from his/her baseline which was documented above under the "Functional History" and "Functional Status" headings.  Judging by the patient's diagnosis, physical exam, and functional history, the patient has potential for functional progress which will result in measurable gains while on inpatient rehab.  These gains will be of substantial and practical use upon discharge  in facilitating mobility and  self-care at the household level. 5. Physiatrist will provide 24 hour management of medical needs as well as oversight of the therapy plan/treatment and provide guidance as appropriate regarding the interaction of the two. 6. The Preadmission Screening has been reviewed and patient status is unchanged unless otherwise stated above. 7. 24 hour rehab nursing will assist with bladder management, bowel management, safety, skin/wound care, disease management, medication administration, pain management and patient education  and help integrate therapy concepts, techniques,education, etc. 8. PT will assess and treat for/with: Lower extremity strength, range of motion, stamina, balance, functional mobility, safety, adaptive techniques and equipment, NMR, visual-spatial awareness.   Goals are: supervision to min assist. 9. OT will assess and treat for/with: ADL's, functional mobility, safety, upper extremity strength, adaptive techniques and equipment, NMR, visual-spatial awareness, family ed.   Goals are: supervision to min assist. Therapy may proceed with showering this patient. 10. SLP will assess and treat for/with: speech, communication.  Goals are: supervision to mod I. 11. Case Management and Social Worker will assess and treat for psychological issues and  discharge planning. 12. Team conference will be held weekly to assess progress toward goals and to determine barriers to discharge. 13. Patient will receive at least 3 hours of therapy per day at least 5 days per week. 14. ELOS: 14-20 days       15. Prognosis:  excellent    I have personally performed a face to face diagnostic evaluation of this patient and formulated the key components of the plan.  Additionally, I have personally reviewed laboratory data, imaging studies, as well as relevant notes and concur with the physician assistant's documentation above.  Ranelle OysterZachary T. Jannell Franta, MD, FAAPMR   Mcarthur Rossettianiel J Angiulli, PA-C 12/06/2017

## 2017-12-06 NOTE — Evaluation (Signed)
Occupational Therapy Evaluation Patient Details Name: Zachary Deleon MRN: [id number] DOB: Jan 14, 1966 Today's Date: 12/06/2017    History of Present Illness Zachary Deleon  is a 52 y.o. male, with history of diabetes mellitus, tobacco abuse, history of DVT in the past, hypertension who has been noncompliant with his medications and has not taken any medication for past 6 months came to hospital with left arm numbness for past 3 days.  As per patient's wife he was having difficulty walking downstairs case and with difficulty using his left hand to grab onto rolling as he descended the stairs.  Patient also had difficulty with speech.  Speech was slurred. MRI positive for patchy subacute infarcts.    Clinical Impression   Pt agreeable to PT/OT co-treat/eval today, wife present. [facility] already seated at EOB on OT/PT arrival. Pt with improvement in LUE movement today, when OT spoke with pt yesterday am pt had no active movement of LUE, today pt with strength 2+/5 to 3-/5 throughout LUE and was able to assist with LUE during transfer task via reaching for and holding onto stedy lift. Pt requiring increased assistance with ADLs due to left hemiparesis limiting strength and coordination. At this time recommend CIR as pt was independent with B/IADLs PTA and is motivated to return to highest level of functioning. Pt and wife are aware of the type and amount of therapy CIR entails as he has received IP rehab in the past, and they are very interested in this venue. Will continue to see pt during acute stay.     Follow Up Recommendations  CIR    Equipment Recommendations  None recommended by OT    Recommendations for Other Services Rehab consult     Precautions / Restrictions Precautions Precautions: Fall Restrictions Weight Bearing Restrictions: No      Mobility Bed Mobility               General bed mobility comments: Pt seated at EOB on OT  arrival  Transfers Overall transfer level: Needs assistance   Transfers: Sit to/from Stand;Stand Pivot Transfers Sit to Stand: Mod assist;+2 physical assistance Stand pivot transfers: Mod assist;+2 physical assistance       General transfer comment: Stedy lift [facility] and [facility]. Pt able to grasp bar and pull himself into standing, maintaining balance throughout transfer task        ADL either performed or assessed with clinical judgement   ADL Overall ADL's : Needs assistance/impaired Eating/Feeding: Set up;Bed level Eating/Feeding Details (indicate cue type and reason): Pt requiring assistance for opening items due to poor LUE grip strength and coordination Grooming: Set up;Sitting Grooming Details (indicate cue type and reason): Pt able to use right dominant hand for grooming              Lower Body Dressing: Total assistance Lower Body Dressing Details (indicate cue type and reason): Pt positioned in front of sara steady lift, therefore OT donning socks. However pt requiring max assist due to sitting balance deficits Toilet Transfer: Maximal assistance(Sara Steady) Toilet Transfer Details (indicate cue type and reason): [facility] Toniann KetSara Steady lift Toileting- Clothing Manipulation and Hygiene: Maximal assistance;Sit to/from stand Toileting - Clothing Manipulation Details (indicate cue type and reason): [facility] using RUE for pulling himself forward and maintaining standing, OT completing hygiene tasks       General ADL Comments: Pt requiring increased assistance  for ADL completion due to LUE and LLE weakness and coordination deficits     Vision Baseline Vision/History: No visual deficits Patient Visual Report: No change from baseline Vision Assessment?: No apparent visual deficits            Pertinent Vitals/Pain Pain Assessment: No/denies pain     Hand Dominance Right   Extremity/Trunk  Assessment Upper Extremity Assessment Upper Extremity Assessment: LUE deficits/detail LUE Deficits / Details: LUE strength: shoulder 2+/5, elbow 3-/5, wrist 3-/5, decreased grip strength LUE Sensation: WNL LUE Coordination: decreased fine motor;decreased gross motor   Lower Extremity Assessment Lower Extremity Assessment: Defer to PT evaluation   Cervical / Trunk Assessment Cervical / Trunk Assessment: Normal   Communication Communication Communication: No difficulties   Cognition Arousal/Alertness: Awake/alert Behavior During Therapy: WFL for tasks assessed/performed;Impulsive Overall Cognitive Status: Within Functional Limits for tasks assessed                                                Home Living   Living Arrangements: Spouse/significant other Available Help at Discharge: Family Type of Home: House Home Access: Stairs to enter Secretary/administrator of Steps: 3 Entrance Stairs-Rails: None Home Layout: One level;Multi-level Alternate Level Stairs-Number of Steps: 10-15 steps to basement Alternate Level Stairs-Rails: Left           Home Equipment: Cane - single point      Lives With: Spouse    Prior Functioning/Environment Level of Independence: Independent with assistive device(s)        Comments: household and short distanced ambulator with SPC PRN        OT Problem List: Decreased strength;Decreased activity tolerance;Impaired balance (sitting and/or standing);Decreased coordination;Decreased safety awareness;Decreased knowledge of use of DME or AE;Impaired UE functional use      OT Treatment/Interventions: Self-care/ADL training;Therapeutic exercise;Neuromuscular education;Manual therapy;Energy conservation;DME and/or AE instruction;Therapeutic activities;Patient/family education    OT Goals(Current goals can be found in the care plan section) Acute Rehab OT Goals Patient Stated Goal: return home after rehab OT Goal Formulation:  With patient/family Time For Goal Achievement: 12/20/17 Potential to Achieve Goals: Good  OT Frequency: Min 2X/week   Barriers to D/C:            Co-evaluation PT/OT/SLP Co-Evaluation/Treatment: Yes Reason for Co-Treatment: Complexity of the patient's impairments (multi-system involvement);For patient/therapist safety;To address functional/ADL transfers   OT goals addressed during session: ADL's and self-care;Proper use of Adaptive equipment and DME         End of Session Equipment Utilized During Treatment: Gait belt;Other (comment)(Stedy lift) Nurse Communication: Mobility status  Activity Tolerance: Patient tolerated treatment well Patient left: in chair;with call bell/phone within reach;with family/visitor present  OT Visit Diagnosis: Muscle weakness (generalized) (M62.81);Hemiplegia and hemiparesis Hemiplegia - Right/Left: Left Hemiplegia - dominant/non-dominant: Non-Dominant Hemiplegia - caused by: Cerebral infarction                Time: 1610-9604 OT Time Calculation (min): 31 min Charges:  OT General Charges $OT Visit: 1 Visit OT Evaluation $OT Eval Moderate Complexity: 1 [address], OTR/L  [phone] 12/06/2017, 9:13 AM

## 2017-12-06 NOTE — Progress Notes (Signed)
Pt is refusing CPAP. Pt states that he doesn't like the CPAP and doesn't wear a CPAP at home. Family at bedside. Pt encouraged to call should he change his mind

## 2017-12-06 NOTE — Clinical Social Work Note (Signed)
Patient will discharge to CIR. LCSW signing off.     Zachary Deleon, Juleen ChinaHeather D, LCSW

## 2017-12-06 NOTE — Care Management (Signed)
PT and OT have recommended CIR. CM discussed with patient and wife at bedside. They are interested in CIR. Pt was very active prior to admission, very determined to get back to being independent. He will have strong family support at discharge.

## 2017-12-06 NOTE — PMR Pre-admission (Signed)
[facility] PMR Admission Coordinator Pre-Admission Assessment  Patient: Zachary Deleon is an 52 y.o., male MRN: [id number] DOB: 16-Jun-1965 Height: 6\' 2"  (188 cm) Weight: 133.4 kg (294 lb)  Insurance Information HMO:     PPO:      PCP:      IPA:      80/20:      OTHER: no HMO PRIMARY: Medicare a and b      Policy#: [id number]      Subscriber: pt Benefits:  Phone #: passport one online     Name: 12/06/17 Eff. Date: 07/13/2017     Deduct: $1364      Out of Pocket Max: none      Life Max: none CIR: 100%      SNF: 20 full days Outpatient: 80%     Co-Pay: 20% Home Health: 100%      Co-Pay: none DME: 80%     Co-Pay: 20% Providers: pt choice  SECONDARY: none      wife states she plans to apply for medicaid  Medicaid Application Date:       Case Manager:  Disability Application Date:       Case Worker:   Emergency Conservator, museum/gallery Information    Name Relation Home Work Mobile   Matherly,[name] R Spouse [phone]        Current Medical History  Patient Admitting Diagnosis:  CVA  History of Present Illness:  HPI: Zachary Deleon is a 52 year old right-handed male with history of COPD/tobacco abuse, carotid stenosis followed by vascular surgery at [facility] Dr. Isabell Jarvis, diabetes mellitus, DVT maintained on Eliquis in the past, hypertension, brain tumor with excision 20 years ago and medical noncompliance as he has not taken his medicines for the past 6 months. Presented to [facility] 12/02/2017 with left-sided weakness and slurred speech.  Urine drug screen negative.  Cranial CT scan showed postsurgical changes seen in the right cerebellar hemisphere.  No acute intracranial process.  Mild chronic ischemic white matter disease.  Patient did not receive TPA.  MRI/MRA showed patchy areas of subacute infarction in the right posterior MCA territory.  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  Moderate stenosis  right MCA bifurcation extending into the M2 branches.  Carotid Doppler showed right internal carotid artery occlusion.  Greater than 70% stenosis in the left ICA.  Echocardiogram with ejection fraction of 60% no wall motion abnormalities.  EEG negative seizure.  Neurology services consulted maintained on aspirin and Plavix for CVA prophylaxis.  Subcutaneous Lovenox for DVT prophylaxis.  Tolerating a regular consistency diet.    Patient's medical record from [facility] has been reviewed by the rehabilitation admission coordinator and physician. NIH Stroke scale: 9 Glascow Coma Scale:n/a  Past Medical History  Past Medical History:  Diagnosis Date  . Arthritis   . COPD (chronic obstructive pulmonary disease) (HCC)   . Diabetes mellitus without complication (HCC)   . DVT (deep venous thrombosis) (HCC)   . Hypertension   . Seizures (HCC)     Family History   family history is not on file.  Prior Rehab/Hospitalizations Has the patient had major surgery during 100 days prior to admission? No    Current Medications See MAR  Patients Current Diet:  Regular diet with thin liquids  Precautions / Restrictions Precautions Precautions: Fall Precaution Comments: impulsive, weak LE's and inability to stand Restrictions Weight Bearing Restrictions: No   Has the patient had  2 or more falls or a fall with injury in the past year?No  Prior Activity Level Community (5-7x/wk): Mod I with cane. Disabled due to diabetes. Was a truck driver  Prior Functional Level Self Care: Did the patient need help bathing, dressing, using the toilet or eating?  Independent  Indoor Mobility: Did the patient need assistance with walking from room to room (with or without device)? Independent  Stairs: Did the patient need assistance with internal or external stairs (with or without device)? Independent  Functional Cognition: Did the patient need help planning regular tasks such as shopping or  remembering to take medications? Independent  Home Assistive Devices / Equipment Home Assistive Devices/Equipment: Cane (specify quad or straight) Home Equipment: Cane - single point  Prior Device Use: Indicate devices/aids used by the patient prior to current illness, exacerbation or injury? cane   Prior Functional Level Current Functional Level  Bed Mobility  Independent  Mod assist   Transfers  Independent  Max assist(with stedy to [facility])   Mobility - Walk/Wheelchair  Independent  Other   Upper Body Dressing  Independent  Mod assist   Lower Body Dressing  Independent  Max assist   Grooming  Independent  Mod assist   Eating/Drinking  Independent  Min assist   Toilet Transfer  Independent  Max assist   Bladder Continence   continent  external catheter   Bowel Management  continent  continent LBM 6/24   Stair Climbing   independent  Other   Communication  intact  dystharthric   Memory  intact  poor stm   Cooking/Meal Prep  independent      Housework  independent    Money Management  independent    Driving   independent      Special needs/care consideration BiPAP/CPAP  N/a CPM  N/a Continuous Drip IV  N/a Dialysis  N/a Life Vest  N/a Oxygen n/a Special Bed  N/a Trach Size  N/a Wound Vac n/a Skin intact  Bowel mgmt: continent LBM 6/24 Bladder mgmt:external catheter Diabetic mgmt yes pta ; Hgb A1c 11.4 Not taking meds for 6 months pta  Previous Home Environment Living Arrangements: Spouse/significant other  Lives With: Spouse Available Help at Discharge: Family, Available 24 hours/day Type of Home: House Home Layout: (main level with basement that he doesnt use) Alternate Level Stairs-Rails: Left Alternate Level Stairs-Number of Steps: 10-15 steps to basement Home Access: Stairs to enter Entrance Stairs-Rails: None Entrance Stairs-Number of Steps: (wife building ramp and making renovations pta) Bathroom Shower/Tub:  Hydrographic surveyor, Engineer, building services: Pharmacist, community: Yes Home Care Services: No  Discharge Living Setting Plans for Discharge Living Setting: Patient's home, Lives with (comment)(wife) Type of Home at Discharge: House Discharge Home Layout: Laundry or work area in basement(daughter has buisiness in basement and laundry) Discharge Home Access: Stairs to enter Secretary/administrator of Steps: 3; they are building a ramp Discharge Bathroom Shower/Tub: Tub/shower unit, Curtain Discharge Bathroom Toilet: Standard Discharge Bathroom Accessibility: Yes How Accessible: Accessible via walker Does the patient have any problems obtaining your medications?: Yes (Describe)  Social/Family/Support Systems Patient Roles: Spouse, Parent Contact Information: wife Anticipated Caregiver: wife Anticipated Caregiver's Contact Information: [phone] Ability/Limitations of Caregiver: no limitations Caregiver Availability: 24/7 Discharge Plan Discussed with Primary Caregiver: Yes Is Caregiver In Agreement with Plan?: Yes Does Caregiver/Family have Issues with Lodging/Transportation while Pt is in Rehab?: No  Goals/Additional Needs Patient/Family Goal for Rehab: supervision to min asisst with PT and OT, supervision with SLP Expected length of  stay: ELOS 10 to 14 days Pt/Family Agrees to Admission and willing to participate: Yes Program Orientation Provided & Reviewed with Pt/Caregiver Including Roles  & Responsibilities: Yes  Patient Condition: I have reviewed patient[facility] medical [facility], spoke with patient;s wife and RN CM by phone. Patient will benefit from the coordinated team approach during an inpatient acute rehabilatation admission. He needs ongoing PT, OT, and SLP 3 hours per day . Coordinated care with Rehabilitation physician as well as Rehabilitation Nursing care. He is currently mod to max assist with all mobility and adls,, We will admit to acute  inpatient rehabilitation today.  Preadmission Screen Completed By:  Clois DupesBoyette, Barbara Godwin, 12/06/2017 9:42 PM ______________________________________________________________________   Discussed status with Dr. Riley KillSwartz on  12/07/2017 at 0745 and received telephone approval for admission today.  Admission Coordinator:  Clois DupesBoyette, Barbara Godwin, time  [id number] Date  12/07/2017   Assessment/Plan: Diagnosis: Right MCA distribution infarct 1. Does the need for close, 24 hr/day  Medical supervision in concert with the patient's rehab needs make it unreasonable for this patient to be served in a less intensive setting? Yes 2. Co-Morbidities requiring supervision/potential complications: COPD, CAS, DM, HTN 3. Due to bladder management, bowel management, safety, skin/wound care, disease management, medication administration, pain management and patient education, does the patient require 24 hr/day rehab nursing? Yes 4. Does the patient require coordinated care of a physician, rehab nurse, PT (1-2 hrs/day, 5 days/week), OT (1-2 hrs/day, 5 days/week) and SLP (1-2 hrs/day, 5 days/week) to address physical and functional deficits in the context of the above medical diagnosis(es)? Yes Addressing deficits in the following areas: balance, endurance, locomotion, strength, transferring, bowel/bladder control, bathing, dressing, feeding, grooming, toileting, cognition, speech, swallowing and psychosocial support 5. Can the patient actively participate in an intensive therapy program of at least 3 hrs of therapy 5 days a week? Yes 6. The potential for patient to make measurable gains while on inpatient rehab is excellent 7. Anticipated functional outcomes upon discharge from inpatients are: supervision and min assist PT, supervision and min assist OT, supervision SLP 8. Estimated rehab length of stay to reach the above functional goals is: 10-14 days 9. Does the patient have adequate social supports to accommodate these  discharge functional goals? Yes 10. Anticipated D/C setting: Home 11. Anticipated post D/C treatments: HH therapy 12. Overall Rehab/Functional Prognosis: excellent    RECOMMENDATIONS: This patient's condition is appropriate for continued rehabilitative care in the following setting: CIR Patient has agreed to participate in recommended program. Yes Note that insurance prior authorization may be required for reimbursement for recommended care.  Comment: Admit to inpatient rehab today  Ranelle OysterZachary T. Swartz, MD, [facility] LLC[facility]APMR [facility] 12/07/2017   Clois DupesBoyette, Barbara Godwin 12/06/2017

## 2017-12-06 NOTE — Progress Notes (Signed)
Physical Therapy Treatment Patient Details Name: Zachary Deleon MRN: [id number] DOB: Oct 16, 1965 Today's Date: 12/06/2017    History of Present Illness Zachary Deleon  is a 52 y.o. male, with history of diabetes mellitus, tobacco abuse, history of DVT in the past, hypertension who has been noncompliant with his medications and has not taken any medication for past 6 months came to hospital with left arm numbness for past 3 days.  As per patient's wife he was having difficulty walking downstairs case and with difficulty using his left hand to grab onto rolling as he descended the stairs.  Patient also had difficulty with speech.  Speech was slurred. MRI positive for patchy subacute infarcts.     PT Comments    The patient is progressing well with noted increased active movement in the LLE.  Noted  Trace dorsiflexion, noted Knee extension 3/5. Decreased knee control when standing inside the [facility]. Patient will benefit from CIR for rehab. Continue PT  Follow Up Recommendations  CIR     Equipment Recommendations  (TBD in rehab setting)    Recommendations for Other Services Rehab consult     Precautions / Restrictions Precautions Precautions: Fall Precaution Comments: impulsive, weak Left LE Restrictions Weight Bearing Restrictions: No    Mobility  Bed Mobility               General bed mobility comments: Pt seated at EOB on PT arrival  Transfers Overall transfer level: Needs assistance   Transfers: Sit to/from Stand;Stand Pivot Transfers Sit to Stand: Mod assist;+2 physical assistance Stand pivot transfers: Mod assist;+2 physical assistance       General transfer comment: Stedy lift used for transfer from bed to [facility] and back to chair. Pt able to grasp bar and pull himself into standing, maintaining balance throughout transfer task, cues for keeping trunk at midline. Patient maintained grip on bar of STEDY intermittently slides off.   Ambulation/Gait                 Stairs             Wheelchair Mobility    Modified Rankin (Stroke Patients Only)       Balance   Sitting-balance support: Feet supported;Bilateral upper extremity supported Sitting balance-Leahy Scale: Fair     Standing balance support: Bilateral upper extremity supported;During functional activity Standing balance-Leahy Scale: Poor Standing balance comment: fair in steady, cues to shift to right                            Cognition Arousal/Alertness: Awake/alert Behavior During Therapy: WFL for tasks assessed/performed;Impulsive Overall Cognitive Status: Within Functional Limits for tasks assessed                                        Exercises      General Comments        Pertinent Vitals/Pain Pain Assessment: No/denies pain    Home Living   Living Arrangements: Spouse/significant other Available Help at Discharge: Family Type of Home: House Home Access: Stairs to enter Entrance Stairs-Rails: None Home Layout: One level;Multi-level Home Equipment: Gilmer Mor - single point      Prior Function Level of Independence: Independent with assistive device(s)      Comments: household and short distanced ambulator with SPC PRN   PT Goals (current goals can now be found  in the care plan section) Acute Rehab PT Goals Patient Stated Goal: return home after rehab Progress towards PT goals: Progressing toward goals    Frequency    Min 4X/week      PT Plan Discharge plan needs to be updated;Frequency needs to be updated    Co-evaluation PT/OT/SLP Co-Evaluation/Treatment: Yes Reason for Co-Treatment: Complexity of the patient's impairments (multi-system involvement) PT goals addressed during session: Mobility/safety with mobility OT goals addressed during session: ADL's and self-care;Proper use of Adaptive equipment and DME      AM-PAC PT "6 Clicks" Daily Activity  Outcome Measure  Difficulty turning over in bed  (including adjusting bedclothes, sheets and blankets)?: A Little Difficulty moving from lying on back to sitting on the side of the bed? : A Little Difficulty sitting down on and standing up from a chair with arms (e.g., wheelchair, bedside commode, etc,.)?: A Lot Help needed moving to and from a bed to chair (including a wheelchair)?: A Lot Help needed walking in hospital room?: Total Help needed climbing 3-5 steps with a railing? : Total 6 Click Score: 12    End of Session Equipment Utilized During Treatment: Gait belt Activity Tolerance: Patient tolerated treatment well;Patient limited by fatigue Patient left: in chair;with call bell/phone within reach;with family/visitor present Nurse Communication: Mobility status;Need for lift equipment PT Visit Diagnosis: Other symptoms and signs involving the nervous system (Z61.096(R29.898)     Time: [id number] PT Time Calculation (min) (ACUTE ONLY): 32 min  Charges:  $Neuromuscular Re-education: 8-22 mins                    G CodesBlanchard Kelch:       Amante Fomby PT [phone]    Rada HayHill, Vincent Streater Elizabeth 12/06/2017, 10:04 AM

## 2017-12-07 ENCOUNTER — Other Ambulatory Visit: Payer: Self-pay

## 2017-12-07 ENCOUNTER — Inpatient Hospital Stay (HOSPITAL_COMMUNITY)
Admission: RE | Admit: 2017-12-07 | Discharge: 2017-12-21 | DRG: 057 | Disposition: A | Discharge status: Home Health | Payer: Medicare Other | Source: Other Acute Inpatient Hospital | Attending: Physical Medicine & Rehabilitation | Admitting: Physical Medicine & Rehabilitation

## 2017-12-07 ENCOUNTER — Encounter (HOSPITAL_COMMUNITY): Payer: Self-pay | Admitting: Nurse Practitioner

## 2017-12-07 DIAGNOSIS — D696 Thrombocytopenia, unspecified: Secondary | ICD-10-CM

## 2017-12-07 DIAGNOSIS — F329 Major depressive disorder, single episode, unspecified: Secondary | ICD-10-CM | POA: Diagnosis present

## 2017-12-07 DIAGNOSIS — Z86718 Personal history of other venous thrombosis and embolism: Secondary | ICD-10-CM

## 2017-12-07 DIAGNOSIS — E785 Hyperlipidemia, unspecified: Secondary | ICD-10-CM

## 2017-12-07 DIAGNOSIS — E1169 Type 2 diabetes mellitus with other specified complication: Secondary | ICD-10-CM | POA: Diagnosis not present

## 2017-12-07 DIAGNOSIS — I10 Essential (primary) hypertension: Secondary | ICD-10-CM

## 2017-12-07 DIAGNOSIS — G8194 Hemiplegia, unspecified affecting left nondominant side: Secondary | ICD-10-CM

## 2017-12-07 DIAGNOSIS — F17210 Nicotine dependence, cigarettes, uncomplicated: Secondary | ICD-10-CM | POA: Diagnosis present

## 2017-12-07 DIAGNOSIS — Z716 Tobacco abuse counseling: Secondary | ICD-10-CM

## 2017-12-07 DIAGNOSIS — E871 Hypo-osmolality and hyponatremia: Secondary | ICD-10-CM

## 2017-12-07 DIAGNOSIS — Z79899 Other long term (current) drug therapy: Secondary | ICD-10-CM | POA: Diagnosis not present

## 2017-12-07 DIAGNOSIS — I69322 Dysarthria following cerebral infarction: Secondary | ICD-10-CM | POA: Diagnosis not present

## 2017-12-07 DIAGNOSIS — Z713 Dietary counseling and surveillance: Secondary | ICD-10-CM

## 2017-12-07 DIAGNOSIS — Z7901 Long term (current) use of anticoagulants: Secondary | ICD-10-CM

## 2017-12-07 DIAGNOSIS — I63511 Cerebral infarction due to unspecified occlusion or stenosis of right middle cerebral artery: Secondary | ICD-10-CM | POA: Diagnosis not present

## 2017-12-07 DIAGNOSIS — Z9119 Patient's noncompliance with other medical treatment and regimen: Secondary | ICD-10-CM

## 2017-12-07 DIAGNOSIS — Z9849 Cataract extraction status, unspecified eye: Secondary | ICD-10-CM | POA: Diagnosis not present

## 2017-12-07 DIAGNOSIS — I6523 Occlusion and stenosis of bilateral carotid arteries: Secondary | ICD-10-CM | POA: Diagnosis present

## 2017-12-07 DIAGNOSIS — I69392 Facial weakness following cerebral infarction: Secondary | ICD-10-CM

## 2017-12-07 DIAGNOSIS — E119 Type 2 diabetes mellitus without complications: Secondary | ICD-10-CM

## 2017-12-07 DIAGNOSIS — Z6837 Body mass index (BMI) 37.0-37.9, adult: Secondary | ICD-10-CM

## 2017-12-07 DIAGNOSIS — Z794 Long term (current) use of insulin: Secondary | ICD-10-CM

## 2017-12-07 DIAGNOSIS — E1142 Type 2 diabetes mellitus with diabetic polyneuropathy: Secondary | ICD-10-CM | POA: Diagnosis present

## 2017-12-07 DIAGNOSIS — I69354 Hemiplegia and hemiparesis following cerebral infarction affecting left non-dominant side: Secondary | ICD-10-CM | POA: Diagnosis present

## 2017-12-07 DIAGNOSIS — E669 Obesity, unspecified: Secondary | ICD-10-CM

## 2017-12-07 DIAGNOSIS — M79602 Pain in left arm: Secondary | ICD-10-CM | POA: Diagnosis not present

## 2017-12-07 DIAGNOSIS — J449 Chronic obstructive pulmonary disease, unspecified: Secondary | ICD-10-CM | POA: Diagnosis present

## 2017-12-07 DIAGNOSIS — Z885 Allergy status to narcotic agent status: Secondary | ICD-10-CM

## 2017-12-07 HISTORY — DX: Cerebral infarction, unspecified: I63.9

## 2017-12-07 LAB — CBC
HCT: 46.7 % (ref 39.0–52.0)
Hemoglobin: 15.6 g/dL (ref 13.0–17.0)
MCH: 29.4 pg (ref 26.0–34.0)
MCHC: 33.4 g/dL (ref 30.0–36.0)
MCV: 87.9 fL (ref 78.0–100.0)
PLATELETS: 138 10*3/uL — AB (ref 150–400)
RBC: 5.31 MIL/uL (ref 4.22–5.81)
RDW: 13.5 % (ref 11.5–15.5)
WBC: 6.8 10*3/uL (ref 4.0–10.5)

## 2017-12-07 LAB — GLUCOSE, CAPILLARY
GLUCOSE-CAPILLARY: 179 mg/dL — AB (ref 70–99)
GLUCOSE-CAPILLARY: 208 mg/dL — AB (ref 70–99)
Glucose-Capillary: 173 mg/dL — ABNORMAL HIGH (ref 70–99)
Glucose-Capillary: 138 mg/dL — ABNORMAL HIGH (ref 70–99)
Glucose-Capillary: 152 mg/dL — ABNORMAL HIGH (ref 70–99)

## 2017-12-07 LAB — CREATININE, SERUM
CREATININE: 0.84 mg/dL (ref 0.61–1.24)
GFR calc non Af Amer: >60 mL/min (ref >60)

## 2017-12-07 MED ORDER — SORBITOL 70 % SOLN: 30.0000 mL | Freq: Every day | Status: DC | PRN

## 2017-12-07 MED ORDER — METFORMIN HCL ER 500 MG PO TB24: 1000.0000 mg | ORAL_TABLET | Freq: Two times a day (BID) | ORAL | 11 refills | Status: DC

## 2017-12-07 MED ORDER — NICOTINE 21 MG/24HR TD PT24
21.0000 mg | MEDICATED_PATCH | Freq: Every day | TRANSDERMAL | Status: DC
  Administered 2017-12-07 – 2017-12-19 (×13): 21 mg via TRANSDERMAL
  Filled 2017-12-07 (×13): qty 1

## 2017-12-07 MED ORDER — INSULIN ASPART 100 UNIT/ML ~~LOC~~ SOLN
0.0000 [IU] | Freq: Three times a day (TID) | SUBCUTANEOUS | Status: DC
  Administered 2017-12-07: 2 [IU] via SUBCUTANEOUS
  Administered 2017-12-07: 5 [IU] via SUBCUTANEOUS
  Administered 2017-12-08 (×2): 2 [IU] via SUBCUTANEOUS
  Administered 2017-12-08: 3 [IU] via SUBCUTANEOUS
  Administered 2017-12-09 – 2017-12-17 (×14): 2 [IU] via SUBCUTANEOUS
  Administered 2017-12-18: 3 [IU] via SUBCUTANEOUS
  Administered 2017-12-19 – 2017-12-20 (×3): 2 [IU] via SUBCUTANEOUS

## 2017-12-07 MED ORDER — ASPIRIN 300 MG RE SUPP
300.0000 mg | Freq: Every day | RECTAL | Status: DC
  Filled 2017-12-07 (×2): qty 1

## 2017-12-07 MED ORDER — BUTALBITAL-APAP-CAFFEINE 50-325-40 MG PO TABS
2.0000 | ORAL_TABLET | ORAL | Status: AC | PRN
  Administered 2017-12-08 (×2): 2 via ORAL
  Filled 2017-12-07 (×2): qty 2

## 2017-12-07 MED ORDER — ASPIRIN 325 MG PO TABS
325.0000 mg | ORAL_TABLET | Freq: Every day | ORAL | Status: DC
  Administered 2017-12-07 – 2017-12-21 (×15): 325 mg via ORAL
  Filled 2017-12-07 (×15): qty 1

## 2017-12-07 MED ORDER — TRAMADOL HCL 50 MG PO TABS
25.0000 mg | ORAL_TABLET | Freq: Four times a day (QID) | ORAL | Status: DC | PRN
  Administered 2017-12-07 – 2017-12-21 (×27): 50 mg via ORAL
  Filled 2017-12-07 (×28): qty 1

## 2017-12-07 MED ORDER — ACETAMINOPHEN 325 MG PO TABS
650.0000 mg | ORAL_TABLET | ORAL | Status: DC | PRN
  Administered 2017-12-08 – 2017-12-20 (×24): 650 mg via ORAL
  Filled 2017-12-07 (×25): qty 2

## 2017-12-07 MED ORDER — INSULIN GLARGINE 100 UNIT/ML SOLOSTAR PEN: 30.0000 [IU] | PEN_INJECTOR | Freq: Every day | SUBCUTANEOUS | 11 refills | Status: DC

## 2017-12-07 MED ORDER — FENOFIBRATE 160 MG PO TABS
160.0000 mg | ORAL_TABLET | Freq: Every day | ORAL | Status: DC
  Administered 2017-12-07 – 2017-12-21 (×15): 160 mg via ORAL
  Filled 2017-12-07 (×15): qty 1

## 2017-12-07 MED ORDER — INSULIN GLARGINE 100 UNIT/ML ~~LOC~~ SOLN
45.0000 [IU] | Freq: Every day | SUBCUTANEOUS | Status: DC
  Administered 2017-12-07 – 2017-12-20 (×14): 45 [IU] via SUBCUTANEOUS
  Filled 2017-12-07 (×14): qty 0.45

## 2017-12-07 MED ORDER — ACETAMINOPHEN 160 MG/5ML PO SOLN: 650.0000 mg | ORAL | Status: DC | PRN

## 2017-12-07 MED ORDER — ONDANSETRON HCL 4 MG/2ML IJ SOLN: 4.0000 mg | Freq: Four times a day (QID) | INTRAMUSCULAR | Status: DC | PRN

## 2017-12-07 MED ORDER — INSULIN ASPART 100 UNIT/ML ~~LOC~~ SOLN
10.0000 [IU] | Freq: Three times a day (TID) | SUBCUTANEOUS | Status: DC
  Administered 2017-12-07 – 2017-12-21 (×42): 10 [IU] via SUBCUTANEOUS

## 2017-12-07 MED ORDER — QUETIAPINE FUMARATE 25 MG PO TABS
25.0000 mg | ORAL_TABLET | Freq: Two times a day (BID) | ORAL | Status: DC | PRN
  Administered 2017-12-07 – 2017-12-12 (×3): 25 mg via ORAL
  Filled 2017-12-07 (×3): qty 1

## 2017-12-07 MED ORDER — GABAPENTIN 100 MG PO CAPS
100.0000 mg | ORAL_CAPSULE | Freq: Three times a day (TID) | ORAL | Status: DC
Start: 2017-12-07 — End: 2017-12-21
  Administered 2017-12-07 – 2017-12-21 (×42): 100 mg via ORAL
  Filled 2017-12-07 (×43): qty 1

## 2017-12-07 MED ORDER — ONDANSETRON HCL 4 MG PO TABS
4.0000 mg | ORAL_TABLET | Freq: Four times a day (QID) | ORAL | Status: DC | PRN
Start: 2017-12-07 — End: 2017-12-21

## 2017-12-07 MED ORDER — ENOXAPARIN SODIUM 80 MG/0.8ML ~~LOC~~ SOLN
70.0000 mg | SUBCUTANEOUS | Status: DC
  Administered 2017-12-07 – 2017-12-09 (×3): 70 mg via SUBCUTANEOUS
  Filled 2017-12-07 (×3): qty 0.7

## 2017-12-07 MED ORDER — MOMETASONE FURO-FORMOTEROL FUM 200-5 MCG/ACT IN AERO
2.0000 | INHALATION_SPRAY | Freq: Two times a day (BID) | RESPIRATORY_TRACT | Status: DC
  Administered 2017-12-08 – 2017-12-21 (×22): 2 via RESPIRATORY_TRACT
  Filled 2017-12-07: qty 8.8

## 2017-12-07 MED ORDER — SENNOSIDES-DOCUSATE SODIUM 8.6-50 MG PO TABS: 1.0000 | ORAL_TABLET | Freq: Every evening | ORAL | Status: DC | PRN

## 2017-12-07 MED ORDER — CLOPIDOGREL BISULFATE 75 MG PO TABS
75.0000 mg | ORAL_TABLET | Freq: Every day | ORAL | Status: DC
  Administered 2017-12-08 – 2017-12-21 (×14): 75 mg via ORAL
  Filled 2017-12-07 (×14): qty 1

## 2017-12-07 MED ORDER — ATORVASTATIN CALCIUM 80 MG PO TABS
80.0000 mg | ORAL_TABLET | Freq: Every day | ORAL | Status: DC
  Administered 2017-12-07 – 2017-12-20 (×14): 80 mg via ORAL
  Filled 2017-12-07 (×14): qty 1

## 2017-12-07 MED ORDER — ACETAMINOPHEN 650 MG RE SUPP: 650.0000 mg | RECTAL | Status: DC | PRN

## 2017-12-07 MED ORDER — ENOXAPARIN SODIUM 80 MG/0.8ML ~~LOC~~ SOLN: 70.0000 mg | SUBCUTANEOUS | Status: DC

## 2017-12-07 MED ORDER — FLUOXETINE HCL 20 MG PO CAPS
20.0000 mg | ORAL_CAPSULE | Freq: Every day | ORAL | Status: DC
  Administered 2017-12-07 – 2017-12-21 (×15): 20 mg via ORAL
  Filled 2017-12-07 (×15): qty 1

## 2017-12-07 NOTE — Progress Notes (Signed)
Patient to discharge to CIR today. Report given to [facility]. Earnstine RegalAshley Torrez Renfroe, RN

## 2017-12-07 NOTE — Progress Notes (Signed)
in [facility] MEDICAL SURGICAL UNIT      Signed           Show:Clear all [x] Manual[x] Template[x] Copied  Added by: [x] Standley Brooking, RN[x] Ranelle Oyster, MD   [] Hover for details     Secondary Market PMR Admission Coordinator Pre-Admission Assessment  Patient: Zachary Deleon is an 52 y.o., male MRN: [id number] DOB: [birth year] Height: 6\' 2"  (188 cm) Weight: 133.4 kg (294 lb)  Insurance Information HMO:     PPO:      PCP:      IPA:      80/20:      OTHER: no HMO PRIMARY: Medicare a and b      Policy#: [id number]      Subscriber: pt Benefits:  Phone #: passport one online     Name: 12/06/17 Eff. Date: 07/13/2017     Deduct: $1364      Out of Pocket Max: none      Life Max: none CIR: 100%      SNF: 20 full days Outpatient: 80%     Co-Pay: 20% Home Health: 100%      Co-Pay: none DME: 80%     Co-Pay: 20% Providers: pt choice  SECONDARY: none      wife states she plans to apply for medicaid  Medicaid Application Date:       Case Manager:  Disability Application Date:       Case Worker:   Emergency Chief Operating Officer Information    Name Relation Home Work Mobile   Zachary Deleon Spouse [phone]        Current Medical History  Patient Admitting Diagnosis:  CVA  History of Present Illness:  Zachary Deleon is a 52 year old right-handed male with history of COPD/tobacco abuse, carotid stenosis followed by vascular surgery at [facility] Dr. Sharilyn Sites mellitus, DVT maintained on Eliquisin the past, hypertension, brain tumor with excision 20 years ago and medical noncompliance as he has not taken his medicines for the past 6 months. Presented to [facility] 12/02/2017 with left-sided weakness and slurred speech.Urine drug screen negative.Cranial CT scan showed postsurgical changes seen in the right cerebellar hemisphere. No acute intracranial process. Mild chronic ischemic white matter  disease. Patient did not receive TPA. MRI/MRA showed patchy areas of subacute infarction in the right posterior MCA territory. 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 Moderate stenosis right MCA bifurcation extending into the M2 branches. Carotid Doppler showed right internal carotid artery occlusion. Greater than 70% stenosis in the left ICA. Echocardiogram with ejection fraction of 60% no wall motion abnormalities. EEG negative seizure. Neurology services consulted maintained on aspirin and Plavix for CVA prophylaxis. Subcutaneous Lovenox for DVT prophylaxis. Tolerating a regular consistency diet.   Patient's medical record from [facility] has been reviewed by the rehabilitation admission coordinator and physician. NIH Stroke scale: 9 Glascow Coma Scale:n/a  Past Medical History      Past Medical History:  Diagnosis Date  . Arthritis   . COPD (chronic obstructive pulmonary disease) (HCC)   . Diabetes mellitus without complication (HCC)   . DVT (deep venous thrombosis) (HCC)   . Hypertension   . Seizures (HCC)     Family History   family history is not on file.  Prior Rehab/Hospitalizations Has the patient had major surgery during 100 days prior to admission? No  Current Medications See MAR  Patients Current Diet:  Regular diet with thin liquids  Precautions / Restrictions Precautions Precautions: Fall Precaution Comments: impulsive, weak LE's and inability to stand Restrictions Weight Bearing Restrictions: No   Has the patient had 2 or more falls or a fall with injury in the past year?No  Prior Activity Level Community (5-7x/wk): Mod I with cane. Disabled due to diabetes. Was a truck driver  Prior Functional Level Self Care: Did the patient need help bathing, dressing, using the toilet or eating?  Independent  Indoor Mobility: Did the patient need assistance with walking from  room to room (with or without device)? Independent  Stairs: Did the patient need assistance with internal or external stairs (with or without device)? Independent  Functional Cognition: Did the patient need help planning regular tasks such as shopping or remembering to take medications? Independent  Home Assistive Devices / Equipment Home Assistive Devices/Equipment: Cane (specify quad or straight) Home Equipment: Cane - single point  Prior Device Use: Indicate devices/aids used by the patient prior to current illness, exacerbation or injury? cane   Prior Functional Level Current Functional Level  Bed Mobility  Independent  Mod assist   Transfers  Independent  Max assist(with stedy to [facility])   Mobility - Walk/Wheelchair  Independent  Other   Upper Body Dressing  Independent  Mod assist   Lower Body Dressing  Independent  Max assist   Grooming  Independent  Mod assist   Eating/Drinking  Independent  Min assist   Toilet Transfer  Independent  Max assist   Bladder Continence   continent  external catheter   Bowel Management  continent  continent LBM 6/24   Stair Climbing   independent  Other   Communication  intact  dystharthric   Memory  intact  poor stm   Cooking/Meal Prep  independent      Housework  independent    Money Management  independent    Driving   independent      Special needs/care consideration BiPAP/CPAP  N/a CPM  N/a Continuous Drip IV  N/a Dialysis  N/a Life Vest  N/a Oxygen n/a Special Bed  N/a Trach Size  N/a Wound Vac n/a Skin intact  Bowel mgmt: continent LBM 6/24 Bladder mgmt:external catheter Diabetic mgmt yes pta ; Hgb A1c 11.4 Not taking meds for 6 months pta  Previous Home Environment Living Arrangements: Spouse/significant other  Lives With: Spouse Available Help at Discharge: Family, Available 24 hours/day Type of Home: House Home  Layout: (main level with basement that he doesnt use) Alternate Level Stairs-Rails: Left Alternate Level Stairs-Number of Steps: 10-15 steps to basement Home Access: Stairs to enter Entrance Stairs-Rails: None Entrance Stairs-Number of Steps: (wife building ramp and making renovations pta) Bathroom Shower/Tub: Hydrographic surveyor, Engineer, building services: Pharmacist, community: Yes Home Care Services: No  Discharge Living Setting Plans for Discharge Living Setting: Patient's home, Lives with (comment)(wife) Type of Home at Discharge: House Discharge Home Layout: Laundry or work area in basement(daughter has buisiness in basement and laundry) Discharge Home Access: Stairs to enter Secretary/administrator of Steps: 3; they are building a ramp Discharge Bathroom Shower/Tub: Tub/shower unit, Curtain Discharge Bathroom Toilet: Standard Discharge Bathroom Accessibility: Yes How Accessible: Accessible via walker Does the patient have any problems obtaining your medications?: Yes (Describe)  Social/Family/Support Systems Patient Roles: Spouse, Parent Contact Information: wife Anticipated Caregiver: wife Anticipated Caregiver's Contact Information: [phone] Ability/Limitations of Caregiver: no limitations Caregiver Availability: 24/7 Discharge Plan Discussed with Primary Caregiver:  Yes Is Caregiver In Agreement with Plan?: Yes Does Caregiver/Family have Issues with Lodging/Transportation while Pt is in Rehab?: No  Goals/Additional Needs Patient/Family Goal for Rehab: supervision to min asisst with PT and OT, supervision with SLP Expected length of stay: ELOS 10 to 14 days Pt/Family Agrees to Admission and willing to participate: Yes Program Orientation Provided & Reviewed with Pt/Caregiver Including Roles  & Responsibilities: Yes  Patient Condition: I have reviewed patient[facility] medical records [facility], spoke with patient;s wife and RN CM by phone. Patient  will benefit from the coordinated team approach during an inpatient acute rehabilatation admission. He needs ongoing PT, OT, and SLP 3 hours per day . Coordinated care with Rehabilitation physician as well as Rehabilitation Nursing care. He is currently mod to max assist with all mobility and adls,, We will admit to acute inpatient rehabilitation today.  Preadmission Screen Completed By:  Clois DupesBoyette, Marrah Vanevery Godwin, 12/06/2017 9:42 PM ______________________________________________________________________   Discussed status with Dr. Riley KillSwartz on  12/07/2017 at 0745 and received telephone approval for admission today.  Admission Coordinator:  Clois DupesBoyette, Madline Oesterling Godwin, time  [id number] Date  12/07/2017   Assessment/Plan: Diagnosis: Right MCA distribution infarct 1. Does the need for close, 24 hr/day  Medical supervision in concert with the patient's rehab needs make it unreasonable for this patient to be served in a less intensive setting? Yes 2. Co-Morbidities requiring supervision/potential complications: COPD, CAS, DM, HTN 3. Due to bladder management, bowel management, safety, skin/wound care, disease management, medication administration, pain management and patient education, does the patient require 24 hr/day rehab nursing? Yes 4. Does the patient require coordinated care of a physician, rehab nurse, PT (1-2 hrs/day, 5 days/week), OT (1-2 hrs/day, 5 days/week) and SLP (1-2 hrs/day, 5 days/week) to address physical and functional deficits in the context of the above medical diagnosis(es)? Yes Addressing deficits in the following areas: balance, endurance, locomotion, strength, transferring, bowel/bladder control, bathing, dressing, feeding, grooming, toileting, cognition, speech, swallowing and psychosocial support 5. Can the patient actively participate in an intensive therapy program of at least 3 hrs of therapy 5 days a week? Yes 6. The potential for patient to make measurable gains while on inpatient rehab  is excellent 7. Anticipated functional outcomes upon discharge from inpatients are: supervision and min assist PT, supervision and min assist OT, supervision SLP 8. Estimated rehab length of stay to reach the above functional goals is: 10-14 days 9. Does the patient have adequate social supports to accommodate these discharge functional goals? Yes 10. Anticipated D/C setting: Home 11. Anticipated post D/C treatments: HH therapy 12. Overall Rehab/Functional Prognosis: excellent    RECOMMENDATIONS: This patient's condition is appropriate for continued rehabilitative care in the following setting: CIR Patient has agreed to participate in recommended program. Yes Note that insurance prior authorization may be required for reimbursement for recommended care.  Comment: Admit to inpatient rehab today  Ranelle OysterZachary T. Swartz, MD, Sanpet[facility] HospitalFAAPMR [facility] 12/07/2017   Clois DupesBoyette, Janziel Hockett Godwin 12/06/2017

## 2017-12-07 NOTE — H&P (Signed)
Physical Medicine and Rehabilitation Admission H&P       Chief Complaint  Patient presents with  . Numbness  : HPI: Zachary Deleon is a 52 year old right-handed male with history of COPD/tobacco abuse, carotid stenosis followed by vascular surgery at [facility] Dr. Isabell Jarvis, diabetes mellitus, DVT maintained on Eliquis in the past, hypertension, brain tumor with excision 20 years ago and medical noncompliance as he has not taken his medicines for the past 6 months.  Patient lives with his wife.  Used a straight point cane prior to admission.  Wife can assist as needed.  One level home with 3 steps to entry.  Presented to [facility] 12/02/2017 with left-sided weakness and slurred speech.  Urine drug screen negative.  Cranial CT scan showed postsurgical changes seen in the right cerebellar hemisphere.  No acute intracranial process.  Mild chronic ischemic white matter disease.  Patient did not receive TPA.  MRI/MRA showed patchy areas of subacute infarction in the right posterior MCA territory.  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  Moderate stenosis right MCA bifurcation extending into the M2 branches.  Carotid Doppler showed right internal carotid artery occlusion.  Greater than 70% stenosis in the left ICA.  Echocardiogram with ejection fraction of 60% no wall motion abnormalities.  EEG negative seizure.  Neurology services consulted maintained on aspirin and Plavix for CVA prophylaxis.  Subcutaneous Lovenox for DVT prophylaxis.  Tolerating a regular consistency diet.  Physical and occupational therapy evaluations completed.  Patient was admitted for a comprehensive rehab program.  Review of Systems  Constitutional: Negative for chills and fever.  HENT: Negative for hearing loss.   Eyes: Negative for blurred vision and double vision.  Respiratory: Negative for cough and shortness of breath.   Cardiovascular: Positive for  leg swelling. Negative for chest pain and palpitations.  Gastrointestinal: Positive for constipation. Negative for nausea and vomiting.  Genitourinary: Negative for dysuria, flank pain and hematuria.  Musculoskeletal: Positive for joint pain and myalgias.  Skin: Negative for rash.  Neurological: Positive for sensory change, speech change, focal weakness and headaches.  Psychiatric/Behavioral: Positive for depression.  All other systems reviewed and are negative.      Past Medical History:  Diagnosis Date  . Arthritis   . COPD (chronic obstructive pulmonary disease) (HCC)   . Diabetes mellitus without complication (HCC)   . DVT (deep venous thrombosis) (HCC)   . Hypertension   . Seizures (HCC)         Past Surgical History:  Procedure Laterality Date  . BRAIN SURGERY    . BRAIN TUMOR EXCISION     performed at [facility]  . CATARACT EXTRACTION     No family history on file. Social History:  reports that he has been smoking.  He has never used smokeless tobacco. He reports that he drinks alcohol. He reports that he has current or past drug history. Allergies:       Allergies  Allergen Reactions  . Morphine And Related Other (See Comments)    Makes pt hyper         Medications Prior to Admission  Medication Sig Dispense Refill  . albuterol (VENTOLIN HFA) 108 (90 Base) MCG/ACT inhaler Inhale 2 puffs into the lungs every 4 (four) hours as needed for wheezing or shortness of breath.    [name] amLODipine (NORVASC) 10 MG tablet Take 10 mg by mouth daily.    [name] apixaban (ELIQUIS) 5 MG TABS  tablet Take 5 mg by mouth 2 (two) times daily.    [name] atorvastatin (LIPITOR) 20 MG tablet Take 20 mg by mouth daily.    . cilostazol (PLETAL) 100 MG tablet Take 100 mg by mouth 2 (two) times daily.    [name] FLUoxetine (PROZAC) 20 MG capsule Take 20 mg by mouth daily.    . Fluticasone-Salmeterol (ADVAIR) 250-50 MCG/DOSE AEPB Inhale 1 puff into the lungs 2 (two) times daily.    [name]  gabapentin (NEURONTIN) 100 MG capsule Take 100 mg by mouth 3 (three) times daily.    [name] gemfibrozil (LOPID) 600 MG tablet Take 600 mg by mouth 2 (two) times daily before a meal.    . Insulin Glargine (LANTUS SOLOSTAR) 100 UNIT/ML Solostar Pen Inject 50 Units into the skin 2 (two) times daily.    [name] lisinopril-hydrochlorothiazide (PRINZIDE,ZESTORETIC) 20-12.5 MG tablet Take 1 tablet by mouth daily.    . metFORMIN (GLUCOPHAGE) 1000 MG tablet Take 1,000 mg by mouth 2 (two) times daily with a meal.    . tiotropium (SPIRIVA) 18 MCG inhalation capsule Place 18 mcg into inhaler and inhale daily.      Drug Regimen Review Drug regimen was reviewed and remains appropriate with no significant issues identified  Home: Home Living Family/patient expects to be discharged to:: Private residence Living Arrangements: Spouse/significant other Available Help at Discharge: Family Type of Home: House Home Access: Stairs to enter Secretary/administrator of Steps: 3 Entrance Stairs-Rails: None Home Layout: One level, Multi-level Alternate Level Stairs-Number of Steps: 10-15 steps to basement Alternate Level Stairs-Rails: Left Bathroom Accessibility: Yes Home Equipment: Cane - single point  Lives With: Spouse   Functional History: Prior Function Level of Independence: Independent with assistive device(s) Comments: household and short distanced ambulator with SPC PRN  Functional Status:  Mobility: Bed Mobility Overal bed mobility: Needs Assistance Bed Mobility: Sidelying to Sit, Sit to Supine Sidelying to sit: Max assist(Instructed assistance with Rt UE with Lt UE, max assistance with Lt LE due to weakness) Supine to sit: Max assist Sit to supine: Max assist(Instructed assistance with Rt UE with Lt UE, max assistance with Lt LE due to weakness) General bed mobility comments: Pt seated at EOB on PT arrival Transfers Overall transfer level: Needs assistance Equipment used: None Transfer  via Lift Equipment: Stedy Transfers: Sit to/from Stand, [employer] Transfers Sit to Stand: Mod assist, +2 physical assistance Stand pivot transfers: Mod assist, +2 physical assistance General transfer comment: Stedy lift used for transfer from bed to [facility] and back to chair. Pt able to grasp bar and pull himself into standing, maintaining balance throughout transfer task Ambulation/Gait Ambulation/Gait assistance: Max assist, Mod assist Gait Distance (Feet): 4 Feet Assistive device: Rolling walker (2 wheeled) Gait Pattern/deviations: Decreased step length - left, Decreased stance time - left, Decreased stride length General Gait Details: limited to 4-5 side steps, 2-3 steps forward due to left side weakness with buckling and poor coordination, left hand slipping off walker due to weakness Gait velocity: slow  ADL: ADL Overall ADL's : Needs assistance/impaired Eating/Feeding: Set up, Bed level Eating/Feeding Details (indicate cue type and reason): Pt requiring assistance for opening items due to poor LUE grip strength and coordination Grooming: Set up, Sitting Grooming Details (indicate cue type and reason): Pt able to use right dominant hand for grooming  Lower Body Dressing: Total assistance Lower Body Dressing Details (indicate cue type and reason): Pt positioned in front of sara steady lift, therefore OT donning socks. However pt requiring max assist  due to sitting balance deficits Toilet Transfer: Maximal assistance(Sara Steady) Toilet Transfer Details (indicate cue type and reason): Pt transferred to [facility] using Toniann Ket lift Toileting- Clothing Manipulation and Hygiene: Maximal assistance, Sit to/from stand Toileting - Clothing Manipulation Details (indicate cue type and reason): Pt standing at [employer] lift using RUE for pulling himself forward and maintaining standing, OT completing hygiene tasks General ADL Comments: Pt requiring increased assistance for ADL completion due to  LUE and LLE weakness and coordination deficits  Cognition: Cognition Overall Cognitive Status: Within Functional Limits for tasks assessed Arousal/Alertness: Lethargic Orientation Level: Oriented to person, Oriented to place, Oriented to situation, Disoriented to time Attention: Focused Focused Attention: Impaired Executive Function: Reasoning Reasoning: Impaired Reasoning Impairment: Functional basic Cognition Arousal/Alertness: Awake/alert Behavior During Therapy: WFL for tasks assessed/performed, Impulsive Overall Cognitive Status: Within Functional Limits for tasks assessed Area of Impairment: Safety/judgement Following Commands: Follows one step commands inconsistently Safety/Judgement: Decreased awareness of safety, Decreased awareness of deficits General Comments: improved awareness of safety and fear of falling with bed mobilty without handrails up  Physical Exam: Blood pressure (!) 145/84, pulse 78, temperature 97.8 F (36.6 C), temperature source Oral, resp. rate 16, height 6\' 2"  (1.88 m), weight 116.3 kg (256 lb 6.3 oz), SpO2 94 %. Physical Exam  Constitutional: He is oriented to person, place, and time. He appears well-developed. No distress.  obese  HENT:  Head: Normocephalic and atraumatic.  Eyes: Pupils are equal, round, and reactive to light. EOM are normal.  Neck: Normal range of motion.  Cardiovascular: Normal rate and regular rhythm. Exam reveals no friction rub.  No murmur heard. Respiratory: Effort normal. No respiratory distress. He has no wheezes.  GI: Soft. Bowel sounds are normal. He exhibits distension. There is no tenderness.  Musculoskeletal:  Some pain with PROM left shoulder, early sublux  Neurological: He is alert and oriented to person, place, and time.  Dysarthric, left central 7, some tongue deviation. Cognition intact. LUE 1+ to 2- deltoid, biceps, triceps, 2/5 wrist and hand. LLE: 3/5 HF, 3-KE and 3/5 ADF/PF. RUE and RLE 5/5. Mild deficit  with LT and pain left arm and leg. Reasonable sitting balance at eob  Skin: Skin is warm and dry.  Psychiatric: He has a normal mood and affect. His behavior is normal.    LabResultsLast48Hours  Results for orders placed or performed during the hospital encounter of 12/02/17 (from the past 48 hour(s))  Glucose, capillary     Status: Abnormal   Collection Time: 12/04/17  4:21 PM  Result Value Ref Range   Glucose-Capillary 200 (H) 70 - 99 mg/dL   Comment 1 Notify RN    Comment 2 Document in Chart   Glucose, capillary     Status: Abnormal   Collection Time: 12/04/17  9:59 PM  Result Value Ref Range   Glucose-Capillary 188 (H) 70 - 99 mg/dL   Comment 1 Notify RN   Glucose, capillary     Status: Abnormal   Collection Time: 12/05/17  4:04 AM  Result Value Ref Range   Glucose-Capillary 158 (H) 70 - 99 mg/dL  Glucose, capillary     Status: Abnormal   Collection Time: 12/05/17  7:29 AM  Result Value Ref Range   Glucose-Capillary 158 (H) 70 - 99 mg/dL   Comment 1 Notify RN    Comment 2 Document in Chart   Glucose, capillary     Status: Abnormal   Collection Time: 12/05/17 12:51 PM  Result Value Ref Range  Glucose-Capillary 155 (H) 70 - 99 mg/dL   Comment 1 Notify RN    Comment 2 Document in Chart   Glucose, capillary     Status: Abnormal   Collection Time: 12/05/17  4:07 PM  Result Value Ref Range   Glucose-Capillary 201 (H) 70 - 99 mg/dL   Comment 1 Notify RN    Comment 2 Document in Chart   Glucose, capillary     Status: Abnormal   Collection Time: 12/05/17  7:18 PM  Result Value Ref Range   Glucose-Capillary 244 (H) 70 - 99 mg/dL   Comment 1 Notify RN    Comment 2 Document in Chart   Glucose, capillary     Status: Abnormal   Collection Time: 12/05/17  8:46 PM  Result Value Ref Range   Glucose-Capillary 252 (H) 70 - 99 mg/dL  Glucose, capillary     Status: Abnormal   Collection Time: 12/06/17  2:11 AM  Result Value Ref Range     Glucose-Capillary 141 (H) 70 - 99 mg/dL  Glucose, capillary     Status: Abnormal   Collection Time: 12/06/17  7:32 AM  Result Value Ref Range   Glucose-Capillary 193 (H) 70 - 99 mg/dL   Comment 1 Notify RN    Comment 2 Document in Chart   Glucose, capillary     Status: Abnormal   Collection Time: 12/06/17 11:16 AM  Result Value Ref Range   Glucose-Capillary 166 (H) 70 - 99 mg/dL   Comment 1 Notify RN    Comment 2 Document in Chart      ImagingResults(Last48hours)  No results found.       Medical Problem List and Plan: 1.  Left side weakness with dysarthria secondary to right MCA territory infarction as well as history of brain tumor many years ago with resection at [facility]             -admit to inpatient rehab 2.  DVT Prophylaxis/Anticoagulation: Subcutaneous Lovenox.  Monitor for any bleeding episodes 3. Pain Management: Neurontin 100 mg 3 times daily, Ultram as needed 4. Mood: Prozac 20 mg daily, Seroquel 25 mg twice daily as needed 5. Neuropsych: This patient is capable of making decisions on his own behalf. 6. Skin/Wound Care: Routine skin checks 7. Fluids/Electrolytes/Nutrition: Routine in and outs with follow-up chemistries 8.  Asymptomatic carotid stenosis.  Patient followed vascular surgery [facility] Dr.Velasquez Derrell Lolling. 9.  Hypertension.  Permissive hypertension.  Monitor with increased mobility.  Patient on Norvasc 10 mg daily, Zestoretic 20-12.5 mg daily prior to admission.  Resume as needed 10.  Diabetes mellitus with peripheral neuropathy.  Hemoglobin A1c 11.4.               -NovoLog 10 units 3 times daily, Lantus insulin 45 units daily.               -Check blood sugars before meals and at bedtime.  Diabetic teaching 11.  Tobacco abuse.  NicoDerm patch.  Counseling 12.  Hyperlipidemia.  Lipitor/fenofibrate 13.  Medical noncompliance.  Counseling 14.  Obesity.  BMI 32.92.  Dietary follow-up  Post Admission  Physician Evaluation: 1. Functional deficits secondary  to right MCA territory infarct. 2. Patient is admitted to receive collaborative, interdisciplinary care between the physiatrist, rehab nursing staff, and therapy team. 3. Patient's level of medical complexity and substantial therapy needs in context of that medical necessity cannot be provided at a lesser intensity of care such as a SNF. 4. Patient has experienced  substantial functional loss from his/her baseline which was documented above under the "Functional History" and "Functional Status" headings.  Judging by the patient's diagnosis, physical exam, and functional history, the patient has potential for functional progress which will result in measurable gains while on inpatient rehab.  These gains will be of substantial and practical use upon discharge  in facilitating mobility and self-care at the household level. 5. Physiatrist will provide 24 hour management of medical needs as well as oversight of the therapy plan/treatment and provide guidance as appropriate regarding the interaction of the two. 6. The Preadmission Screening has been reviewed and patient status is unchanged unless otherwise stated above. 7. 24 hour rehab nursing will assist with bladder management, bowel management, safety, skin/wound care, disease management, medication administration, pain management and patient education  and help integrate therapy concepts, techniques,education, etc. 8. PT will assess and treat for/with: Lower extremity strength, range of motion, stamina, balance, functional mobility, safety, adaptive techniques and equipment, NMR, visual-spatial awareness.   Goals are: supervision to min assist. 9. OT will assess and treat for/with: ADL's, functional mobility, safety, upper extremity strength, adaptive techniques and equipment, NMR, visual-spatial awareness, family ed.   Goals are: supervision to min assist. Therapy may proceed with showering this  patient. 10. SLP will assess and treat for/with: speech, communication.  Goals are: supervision to mod I. 11. Case Management and Social Worker will assess and treat for psychological issues and discharge planning. 12. Team conference will be held weekly to assess progress toward goals and to determine barriers to discharge. 13. Patient will receive at least 3 hours of therapy per day at least 5 days per week. 14. ELOS: 14-20 days       15. Prognosis:  excellent    I have personally performed a face to face diagnostic evaluation of this patient and formulated the key components of the plan.  Additionally, I have personally reviewed laboratory data, imaging studies, as well as relevant notes and concur with the physician assistant's documentation above.  Ranelle OysterZachary T. Cherril Hett, MD, FAAPMR   Mcarthur Rossettianiel J Angiulli, PA-C 12/06/2017

## 2017-12-07 NOTE — Progress Notes (Signed)
Patient to discharge to CIR, [facility] arrived for transport. Pt left floor in stable condition via [facility] transport. [facility] Earnstine RegalAshley Aleira Deiter, RN

## 2017-12-07 NOTE — Progress Notes (Signed)
Patient ID: Zachary Deleon, male   DOB: 05-16-1966, 52 y.o.   MRN: [id number] Patient admitted to 4W26 via stretcher[facility].  Patient and family verbalized understanding of rehab process, specifically regarding fall prevention policy and patient belongings policy.  Patient appears to be in no immediate distress at this time.  Zachary Deleon, Zachary Mottram J, RN

## 2017-12-07 NOTE — Discharge Summary (Signed)
Physician Discharge Summary  Zachary Deleon [facility] ZOX:[id number] DOB: October 06, 1965 DOA: 12/02/2017  PCP: Nelma Rothman, NP Neurologist: Dr. Gerilyn Pilgrim Vascular Surgery: Dr. Rosalio Loud at [facility]  Admit date: 12/02/2017 Discharge date: 12/07/2017  Admitted From: Home  Disposition:  CIR  Recommendations for Outpatient Follow-up:  1. Follow up with PCP in 2 weeks 2. Follow up with neurology in 4 weeks 3. Follow up with vascular surgery Dr. Derrell Lolling in 4 weeks 4. Monitor blood sugars TIDACHS 5. Please titrate blood pressure medications as needed for better control 6. Please obtain BMP/CBC in 1 week  Discharge Condition: STABLE   CODE STATUS: DNR    Brief Hospitalization Summary: Please see all hospital notes, images, labs for full details of the hospitalization. HPI:   Zachary Deleon  is a 52 y.o. male, with history of diabetes mellitus, tobacco abuse, history of DVT in the past, hypertension who has been noncompliant with his medications and has not taken any medication for past 6 months came to hospital with left arm numbness for past 3 days.  As per patient's wife he was having difficulty walking downstairs case and with difficulty using his left hand to grab onto rolling as he descended the stairs.  Patient also had difficulty with speech.  Speech was slurred. Patient denies difficulty walking.  Denies chest pain or shortness of breath. No history of stroke or seizures in the past. No nausea vomiting or diarrhea. Symptoms have been persistent for past 3 days. In the ED CT of the head was done which was negative for stroke. Patient will be admitted for further stroke work-up.  Brief Narrative:  52 year old with past medical history relevant for hyperlipidemia, hypertriglyceridemia, type 2 diabetes on insulin (poorly compliant), severe peripheral vascular disease, hypertension, COPD, gated by active tobacco abuse, history of DVT on apixaban noncompliant who was admitted with  left-sided symptoms including gait difficulty, dysarthria, numbness and was found to have right posterior MCA subacute infarct as well as occlusion of the right ICA and stenosis of the right MCA.  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  He was diffusely weak on the left side with decreased sensation but this has markedly improved.  He also has some dysarthria. -PT/OT recommending CIR.  Pt was accepted and will go today.   - Continue atorvastatin increased to 80 - Continue aspirin/plavix.  PT recommending CIR.  EEG: no epileptiform activity.  -Neurology consulted. Follow up with Dr. Gerilyn Pilgrim in 4 weeks -Carotid ultrasound shows occlusion of right carotid and greater than 70% stenosis on left, will need referral to outpatient vascular surgery (follows up with vascular surgery at [facility], Dr. Isabell Jarvis) -Lipid panel grossly abnormal with severe hyper triglyceridemia which should improve with better glycemic control.  -Hemoglobin A1c was greater than 11 - Working to intensify treatments for better glycemic control with basal bolus insulin.    #) Hypertension/hyperlipidemia/hyperglyceridemia: Permissive hypertension -Increased atorvastatin to 80 mg daily --Treating triglycerides with insulin and improved glycemic control.    #) Peripheral vascular disease: No records here however patient apparently is followed at [facility].  Per discussion with the wife patient is not considered a surgical candidate due to his noncompliance and medical comorbidities. -Continue statin - Holding cilostazol 100 mg twice daily in hospital. Could resume if needed when ambulating better.   #) COPD,  active tobacco abuse: -Counseling provided for tobacco cessation  -Continue L AMA, LA BA/ICS -Continue PRN bronchodilators  #)  History of DVT: Patient is noncompliant with apixaban -Held apixaban 5 mg twice daily as he had not been taking it and has been very noncompliant with taking it  #) Type 2 diabetes on insulin: -Continue basal bolus insulin plus supplemental coverage, BS much better controlled.   CBG (last 3)  Recent Labs    12/06/17 1617 12/06/17 2154 12/07/17 0334  GLUCAP 292* 233* 173*   #) Pain/psych: -Continue fluoxetine 20 mg daily -Continue gabapentin 100 mg 3 times daily -- With increased agitation and delirium, trial of seroquel 25 mg BID prn for agitation if needed  #) Class III obesity: Based on exam patient She likely has some degree of OSA and almost certainly has obesity hypoventilation syndrome. -We will need outpatient sleep study  Fluids: Tolerating p.o. Elect lites: Monitor and supplement Nutrition: Carb/heart healthy diet  Prophylaxis: Enoxaparin  Disposition: CIR recommended  DO NOT RESUSCITATE  Consultants:   Neurology  Procedures:   Echo 12/03/2017: Study Conclusions - Left ventricle: The cavity size was normal. Wall thickness was normal. Systolic function was normal. The estimated ejection fraction was in the range of 55% to 60%. Wall motion was normal; there were no regional wall motion abnormalities. Left ventricular diastolic function parameters were normal. - Aortic valve: Mildly calcified annulus. Trileaflet; normal thickness leaflets. Valve area (VTI): 1.98 cm^2. Valve area (Vmax): 1.7 cm^2. - Left atrium: The atrium was mildly dilated. - Technically adequate study.   Discharge Diagnoses:  Active Problems:   Stroke [facility])   Type 2 diabetes mellitus without complication [facility])    Discharge Instructions: Discharge Instructions    Increase activity slowly   Complete by:  As directed      Allergies as of 12/07/2017      Reactions   Morphine And Related Other (See Comments)   Makes pt hyper      Medication List     STOP taking these medications   amLODipine 10 MG tablet Commonly known as:  NORVASC   cilostazol 100 MG tablet Commonly known as:  PLETAL   ELIQUIS 5 MG Tabs tablet Generic drug:  apixaban   gemfibrozil 600 MG tablet Commonly known as:  LOPID   metFORMIN 1000 MG tablet Commonly known as:  GLUCOPHAGE Replaced by:  metFORMIN 500 MG 24 hr tablet     TAKE these medications   aspirin 325 MG tablet Take 1 tablet (325 mg total) by mouth daily.   atorvastatin 80 MG tablet Commonly known as:  LIPITOR Take 1 tablet (80 mg total) by mouth daily. What changed:    medication strength  how much to take   clopidogrel 75 MG tablet Commonly known as:  PLAVIX Take 1 tablet (75 mg total) by mouth daily with breakfast.   FLUoxetine 20 MG capsule Commonly known as:  PROZAC Take 20 mg by mouth daily.   Fluticasone-Salmeterol 250-50 MCG/DOSE Aepb Commonly known as:  ADVAIR Inhale 1 puff into the lungs 2 (two) times daily.   gabapentin 100 MG capsule Commonly known as:  NEURONTIN Take 100 mg by mouth 3 (three) times daily.   insulin aspart 100 UNIT/ML injection Commonly known as:  novoLOG Inject 10 Units into the skin 3 (three) times daily with meals.   Insulin Glargine 100 UNIT/ML Solostar Pen Commonly known as:  LANTUS SOLOSTAR Inject 30 Units into the skin daily at 10 pm. What changed:  how much to take  when to take this   lisinopril-hydrochlorothiazide 20-12.5 MG tablet Commonly known as:  PRINZIDE,ZESTORETIC Take 1 tablet by mouth daily.   metFORMIN 500 MG 24 hr tablet Commonly known as:  GLUCOPHAGE XR Take 2 tablets (1,000 mg total) by mouth 2 (two) times daily after a meal. Replaces:  metFORMIN 1000 MG tablet   QUEtiapine 25 MG tablet Commonly known as:  SEROQUEL Take 1 tablet (25 mg total) by mouth 2 (two) times daily as needed (agitation).   senna-docusate 8.6-50 MG tablet Commonly known as:  Senokot-S Take 1 tablet by mouth at bedtime.   tiotropium  18 MCG inhalation capsule Commonly known as:  SPIRIVA Place 18 mcg into inhaler and inhale daily.   VENTOLIN HFA 108 (90 Base) MCG/ACT inhaler Generic drug:  albuterol Inhale 2 puffs into the lungs every 4 (four) hours as needed for wheezing or shortness of breath.      Follow-up Information    Nelma Rothman, NP. Schedule an appointment as soon as possible for a visit in 2 week(s).   Specialty:  Internal Medicine Contact information: [address] [place] [address] [phone]        Beryle Beams, MD. Schedule an appointment as soon as possible for a visit in 4 week(s).   Specialty:  Neurology Why:  Hospital Follow Up stroke Contact information: [address] [facility] [address] [phone]        Belva Chimes, MD. Schedule an appointment as soon as possible for a visit in 4 week(s).   Specialty:  Vascular Surgery Contact information: [address] [place] [address] [phone]          Allergies  Allergen Reactions  . Morphine And Related Other (See Comments)    Makes pt hyper   Allergies as of 12/07/2017      Reactions   Morphine And Related Other (See Comments)   Makes pt hyper      Medication List    STOP taking these medications   amLODipine 10 MG tablet Commonly known as:  NORVASC   cilostazol 100 MG tablet Commonly known as:  PLETAL   ELIQUIS 5 MG Tabs tablet Generic drug:  apixaban   gemfibrozil 600 MG tablet Commonly known as:  LOPID   metFORMIN 1000 MG tablet Commonly known as:  GLUCOPHAGE Replaced by:  metFORMIN 500 MG 24 hr tablet     TAKE these medications   aspirin 325 MG tablet Take 1 tablet (325 mg total) by mouth daily.   atorvastatin 80 MG tablet Commonly known as:  LIPITOR Take 1 tablet (80 mg total) by mouth daily. What changed:    medication strength  how much to take   clopidogrel 75 MG tablet Commonly known as:  PLAVIX Take 1 tablet  (75 mg total) by mouth daily with breakfast.   FLUoxetine 20 MG capsule Commonly known as:  PROZAC Take 20 mg by mouth daily.   Fluticasone-Salmeterol 250-50 MCG/DOSE Aepb Commonly known as:  ADVAIR Inhale 1 puff into the lungs 2 (two) times daily.   gabapentin 100 MG capsule Commonly known as:  NEURONTIN Take 100 mg by mouth 3 (three) times daily.   insulin aspart 100 UNIT/ML injection Commonly known as:  novoLOG Inject 10 Units into the skin 3 (three) times daily with meals.   Insulin Glargine 100 UNIT/ML Solostar Pen Commonly known as:  LANTUS SOLOSTAR Inject 30 Units into the skin daily at 10  pm. What changed:    how much to take  when to take this   lisinopril-hydrochlorothiazide 20-12.5 MG tablet Commonly known as:  PRINZIDE,ZESTORETIC Take 1 tablet by mouth daily.   metFORMIN 500 MG 24 hr tablet Commonly known as:  GLUCOPHAGE XR Take 2 tablets (1,000 mg total) by mouth 2 (two) times daily after a meal. Replaces:  metFORMIN 1000 MG tablet   QUEtiapine 25 MG tablet Commonly known as:  SEROQUEL Take 1 tablet (25 mg total) by mouth 2 (two) times daily as needed (agitation).   senna-docusate 8.6-50 MG tablet Commonly known as:  Senokot-S Take 1 tablet by mouth at bedtime.   tiotropium 18 MCG inhalation capsule Commonly known as:  SPIRIVA Place 18 mcg into inhaler and inhale daily.   VENTOLIN HFA 108 (90 Base) MCG/ACT inhaler Generic drug:  albuterol Inhale 2 puffs into the lungs every 4 (four) hours as needed for wheezing or shortness of breath.       Procedures/Studies: Dg Chest 2 View  Result Date: 12/02/2017 CLINICAL DATA:  Left chest pain anteriorly for 1 week.  Smoker. EXAM: CHEST - 2 VIEW COMPARISON:  None. FINDINGS: Normal sized heart. Clear lungs. Mildly prominent interstitial markings. Normal vascularity. No pleural fluid. Mild lower thoracic spine degenerative changes. IMPRESSION: No acute abnormality. Mild chronic interstitial lung disease  compatible with the history of smoking. Electronically Signed   By: Beckie Salts M.D.   On: 12/02/2017 23:50   Ct Head Wo Contrast  Result Date: 12/04/2017 CLINICAL DATA:  Initial evaluation for increased confusion, unresponsiveness. EXAM: CT HEAD WITHOUT CONTRAST TECHNIQUE: Contiguous axial images were obtained from the base of the skull through the vertex without intravenous contrast. COMPARISON:  Prior CT and MRI from 12/03/2017. FINDINGS: Brain: Scattered areas of evolving right MCA infarction again seen, stable in size and distribution from previous. No evidence for hemorrhagic transformation or significant mass effect. No acute intracranial hemorrhage. No new large vessel territory infarct identified. No mass lesion or midline shift. Stable ventricular size and morphology without hydrocephalus. No extra-axial fluid collection. Prior right occipital craniotomy with underlying encephalomalacia, stable. Vascular: No hyperdense vessel. Scattered vascular calcifications noted within the carotid siphons. Skull: Scalp soft tissues within normal limits. Prior right occipital craniotomy. Sinuses/Orbits: Globes and orbital soft tissues demonstrate no acute finding. Mild scattered mucosal thickening within the ethmoidal air cells. Paranasal sinuses are otherwise clear. No mastoid effusion. IMPRESSION: 1. Continued interval evolution of patchy right MCA territory infarcts. No evidence for hemorrhagic transformation. 2. No other new acute intracranial abnormality. Electronically Signed   By: Rise Mu M.D.   On: 12/04/2017 05:21   Ct Head Wo Contrast  Result Date: 12/03/2017 CLINICAL DATA:  52 year old male with acute severe headache. Areas of subacute infarct involving the right MCA territory was seen on recent MRI. EXAM: CT HEAD WITHOUT CONTRAST TECHNIQUE: Contiguous axial images were obtained from the base of the skull through the vertex without intravenous contrast. COMPARISON:  Head CT dated  12/02/2017 and MRI dated 12/03/2017 FINDINGS: Brain: There are patchy areas of lower attenuation primarily involving the right parietal lobe white matter consistent with cytotoxic edema. No associated significant mass effect. There is no midline shift. There is no acute intracranial hemorrhage. There is stable mild dilatation of the ventricles. Vascular: No hyperdense vessel or unexpected calcification. Skull: Postsurgical changes of suboccipital craniectomy. No acute calvarial pathology. Sinuses/Orbits: Mild mucoperiosteal thickening of paranasal sinuses. No air-fluid level. The mastoid air cells are clear. Other: None IMPRESSION: 1. No acute intracranial  hemorrhage. 2. Development of patchy area of cytotoxic edema in the right parietal lobe corresponding to the right MCA territory infarct. Electronically Signed   By: Elgie Collard M.D.   On: 12/03/2017 21:24   Ct Head Wo Contrast  Result Date: 12/02/2017 CLINICAL DATA:  Altered mental status. Slurred speech. Left upper extremity numbness. EXAM: CT HEAD WITHOUT CONTRAST TECHNIQUE: Contiguous axial images were obtained from the base of the skull through the vertex without intravenous contrast. COMPARISON:  None. FINDINGS: Brain: Postsurgical changes are seen involving the right cerebellar hemisphere. Mild chronic ischemic white matter disease is noted. No mass effect or midline shift is noted. Ventricular size is within normal limits. There is no evidence of mass lesion, hemorrhage or acute infarction. Vascular: No hyperdense vessel or unexpected calcification. Skull: Status post right occipital craniotomy. No acute abnormality seen involving the calvarium. Sinuses/Orbits: No acute finding. Other: None. IMPRESSION: Postsurgical changes seen in the right cerebellar hemisphere. Mild chronic ischemic white matter disease. No acute intracranial abnormality seen. Electronically Signed   By: Lupita Raider, M.D.   On: 12/02/2017 20:48   Mr Laqueta Jean GN  Contrast  Result Date: 12/03/2017 CLINICAL DATA:  Focal neuro deficit. Weakness dizziness. History of craniotomy for tumor resection. EXAM: MRI HEAD WITHOUT AND WITH CONTRAST MRA HEAD WITHOUT CONTRAST TECHNIQUE: Multiplanar, multiecho pulse sequences of the brain and surrounding structures were obtained without and with intravenous contrast. Angiographic images of the head were obtained using MRA technique without contrast. CONTRAST:  20mL MULTIHANCE GADOBENATE DIMEGLUMINE 529 MG/ML IV SOLN COMPARISON:  CT head 12/02/2017 FINDINGS: MRI HEAD FINDINGS Brain: Patchy areas of infarct in the right MCA territory show restricted diffusion and patchy enhancement compatible with subacute infarct. This involves the posterior insula on the right extending into the right frontal and parietal lobe. No associated hemorrhage mass, effect or midline shift. Ventricle size mildly prominent due to atrophy. Occipital craniotomy. No evidence of recurrent tumor. Mild encephalomalacia right cerebellum appears chronic. Vascular: Normal arterial flow voids Skull and upper cervical spine: Occipital craniotomy. No acute skeletal abnormality Sinuses/Orbits: Paranasal sinuses clear.  Bilateral cataract surgery Other: None MRA HEAD FINDINGS Occlusion of the right internal carotid artery through the cavernous segment with reconstitution of the supraclinoid internal carotid artery on the right. Right middle cerebral artery patent with moderate stenosis at the right MCA bifurcation extending into the M2 branches. Right anterior cerebral artery widely patent Left internal carotid artery widely patent. Moderate to severe stenosis origin of left A1 segment. Otherwise both anterior middle cerebral arteries are patent without stenosis. Both vertebral arteries patent to the basilar. Basilar widely patent. Superior cerebellar and posterior cerebral arteries patent bilaterally. IMPRESSION: Patchy areas of subacute infarct in the right posterior MCA  territory. These show restricted diffusion and enhancement. 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 Moderate stenosis right MCA bifurcation extending into the M2 branches. Severe stenosis left A1 segment. These results were called by telephone at the time of interpretation on 12/03/2017 at 8:11 am to Dr. Arlyn Leak , who verbally acknowledged these results. Electronically Signed   By: Marlan Palau M.D.   On: 12/03/2017 08:12   US Carotid Bilateral (at [facility] And [facility] Only)  Result Date: 12/03/2017 CLINICAL DATA:  Stroke EXAM: BILATERAL CAROTID DUPLEX ULTRASOUND TECHNIQUE: Wallace Cullens scale imaging, color Doppler and duplex ultrasound were performed of bilateral carotid and vertebral arteries in the neck. COMPARISON:  None. FINDINGS: Criteria: Quantification of carotid stenosis is based on velocity parameters that correlate  the residual internal carotid diameter with NASCET-based stenosis levels, using the diameter of the distal internal carotid lumen as the denominator for stenosis measurement. The following velocity measurements were obtained: RIGHT ICA:  Occluded cm/sec CCA:  52 cm/sec SYSTOLIC ICA/CCA RATIO:  Occluded DIASTOLIC ICA/CCA RATIO:  Occluded ECA:  179 cm/sec LEFT ICA:  405 cm/sec CCA:  78 cm/sec SYSTOLIC ICA/CCA RATIO:  5.2 DIASTOLIC ICA/CCA RATIO: ECA:  161 cm/sec RIGHT CAROTID ARTERY: The internal carotid is occluded just above the bulb. There is significant irregular plaque at the origin of the external carotid artery. RIGHT VERTEBRAL ARTERY:  Antegrade. LEFT CAROTID ARTERY: There is mild smooth soft plaque in the lower common carotid artery. There is extensive predominately soft plaque in the bulb. Low resistance internal carotid Doppler pattern is preserved with a markedly elevated velocity. LEFT VERTEBRAL ARTERY:  Antegrade. 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 Vertebral arteries are  antegrade in flow. Electronically Signed   By: Jolaine Click M.D.   On: 12/03/2017 12:30   Mr Maxine Glenn Head/brain WR Cm  Result Date: 12/03/2017 CLINICAL DATA:  Focal neuro deficit. Weakness dizziness. History of craniotomy for tumor resection. EXAM: MRI HEAD WITHOUT AND WITH CONTRAST MRA HEAD WITHOUT CONTRAST TECHNIQUE: Multiplanar, multiecho pulse sequences of the brain and surrounding structures were obtained without and with intravenous contrast. Angiographic images of the head were obtained using MRA technique without contrast. CONTRAST:  20mL MULTIHANCE GADOBENATE DIMEGLUMINE 529 MG/ML IV SOLN COMPARISON:  CT head 12/02/2017 FINDINGS: MRI HEAD FINDINGS Brain: Patchy areas of infarct in the right MCA territory show restricted diffusion and patchy enhancement compatible with subacute infarct. This involves the posterior insula on the right extending into the right frontal and parietal lobe. No associated hemorrhage mass, effect or midline shift. Ventricle size mildly prominent due to atrophy. Occipital craniotomy. No evidence of recurrent tumor. Mild encephalomalacia right cerebellum appears chronic. Vascular: Normal arterial flow voids Skull and upper cervical spine: Occipital craniotomy. No acute skeletal abnormality Sinuses/Orbits: Paranasal sinuses clear.  Bilateral cataract surgery Other: None MRA HEAD FINDINGS Occlusion of the right internal carotid artery through the cavernous segment with reconstitution of the supraclinoid internal carotid artery on the right. Right middle cerebral artery patent with moderate stenosis at the right MCA bifurcation extending into the M2 branches. Right anterior cerebral artery widely patent Left internal carotid artery widely patent. Moderate to severe stenosis origin of left A1 segment. Otherwise both anterior middle cerebral arteries are patent without stenosis. Both vertebral arteries patent to the basilar. Basilar widely patent. Superior cerebellar and posterior cerebral  arteries patent bilaterally. IMPRESSION: Patchy areas of subacute infarct in the right posterior MCA territory. These show restricted diffusion and enhancement. 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 Moderate stenosis right MCA bifurcation extending into the M2 branches. Severe stenosis left A1 segment. These results were called by telephone at the time of interpretation on 12/03/2017 at 8:11 am to Dr. Arlyn Leak , who verbally acknowledged these results. Electronically Signed   By: Marlan Palau M.D.   On: 12/03/2017 08:12     Subjective: Pt agrees to go to rehab.  Headache resolved now.    Discharge Exam: Vitals:   12/06/17 1923 12/06/17 2152  BP:  (!) 152/83  Pulse:  76  Resp:  20  Temp:  97.7 F (36.5 C)  SpO2: 94% 93%   Vitals:   12/06/17 1800 12/06/17 1801 12/06/17 1923 12/06/17 2152  BP:  (!) 156/88  (!) 152/83  Pulse:  83  76  Resp:  20  20  Temp:  98.9 F (37.2 C)  97.7 F (36.5 C)  TempSrc:  Oral  Oral  SpO2:  90% 94% 93%  Weight: 133.4 kg (294 lb)     Height: 6\' 2"  (1.88 m)       General exam: awake, alert, speaking better, NAD.    Respiratory system: No increased work of breathing, scattered rhonchi and intermittent wheezes, no crackles Cardiovascular system: Distant heart sounds, regular rate and rhythm, no murmurs Gastrointestinal system: Soft, nondistended, no rebound or guarding, normal BS.  Central nervous system: Alert and oriented.  Clear speech patterns,  Pronounced left hemiparesis much improved from prior testing.  Extremities: no cyanosis or clubbing.  Skin: No rashes on visible skin Psychiatry: Judgement and insight appear poor l. Mood & affect appropriate.    The results of significant diagnostics from this hospitalization (including imaging, microbiology, ancillary and laboratory) are listed below for reference.     Microbiology: No results found for this or any previous visit (from the past 240 hour(s)).    Labs: BNP (last 3 results) No results for input(s): BNP in the last 8760 hours. Basic Metabolic Panel: Recent Labs  Lab 12/02/17 1954 12/02/17 2016 12/04/17 0647  NA 127* 131* 126*  K 3.9 4.1 3.8  CL 94* 100* 92*  CO2 22  --  24  GLUCOSE 378* 390* 234*  BUN 12 12 9   CREATININE 0.77 0.70 0.73  CALCIUM 8.9  --  8.3*  MG  --   --  1.5*   Liver Function Tests: Recent Labs  Lab 12/02/17 1954  AST 19  ALT 23  ALKPHOS 76  BILITOT 0.5  PROT 6.4*  ALBUMIN 3.9   No results for input(s): LIPASE, AMYLASE in the last 168 hours. No results for input(s): AMMONIA in the last 168 hours. CBC: Recent Labs  Lab 12/02/17 1954 12/02/17 2016 12/04/17 0647  WBC 7.9  --  10.6*  NEUTROABS 4.6  --   --   HGB 17.3* 16.3 14.6  HCT 47.7 48.0 41.7  MCV 86.7  --  86.3  PLT 139*  --  112*   Cardiac Enzymes: No results for input(s): CKTOTAL, CKMB, CKMBINDEX, TROPONINI in the last 168 hours. BNP: Invalid input(s): POCBNP CBG: Recent Labs  Lab 12/06/17 0732 12/06/17 1116 12/06/17 1617 12/06/17 2154 12/07/17 0334  GLUCAP 193* 166* 292* 233* 173*   D-Dimer No results for input(s): DDIMER in the last 72 hours. Hgb A1c No results for input(s): HGBA1C in the last 72 hours. Lipid Profile No results for input(s): CHOL, HDL, LDLCALC, TRIG, CHOLHDL, LDLDIRECT in the last 72 hours. Thyroid function studies No results for input(s): TSH, T4TOTAL, T3FREE, THYROIDAB in the last 72 hours.  Invalid input(s): FREET3 Anemia work up No results for input(s): VITAMINB12, FOLATE, FERRITIN, TIBC, IRON, RETICCTPCT in the last 72 hours. Urinalysis    Component Value Date/Time   COLORURINE YELLOW 12/02/2017 2054   APPEARANCEUR CLEAR 12/02/2017 2054   LABSPEC 1.028 12/02/2017 2054   PHURINE 5.0 12/02/2017 2054   GLUCOSEU >=500 (A) 12/02/2017 2054   HGBUR NEGATIVE 12/02/2017 2054   BILIRUBINUR NEGATIVE 12/02/2017 2054   KETONESUR NEGATIVE 12/02/2017 2054   PROTEINUR 100 (A) 12/02/2017 2054    NITRITE NEGATIVE 12/02/2017 2054   LEUKOCYTESUR NEGATIVE 12/02/2017 2054   Sepsis Labs Invalid input(s): PROCALCITONIN,  WBC,  LACTICIDVEN Microbiology No results found for this or any previous  visit (from the past 240 hour(s)).  Time coordinating discharge: 40 mins  SIGNED:  Standley Dakins, MD  [facility] 12/07/2017, 6:49 AM Pager [phone]  If 7PM-7AM, please contact night-coverage [email or web address] Password [email or web address]

## 2017-12-08 ENCOUNTER — Inpatient Hospital Stay (HOSPITAL_COMMUNITY): Payer: Medicare Other

## 2017-12-08 ENCOUNTER — Inpatient Hospital Stay (HOSPITAL_COMMUNITY): Payer: Medicare Other | Admitting: Occupational Therapy

## 2017-12-08 ENCOUNTER — Inpatient Hospital Stay (HOSPITAL_COMMUNITY): Payer: Medicare Other | Admitting: Physical Therapy

## 2017-12-08 DIAGNOSIS — E1169 Type 2 diabetes mellitus with other specified complication: Secondary | ICD-10-CM

## 2017-12-08 DIAGNOSIS — E669 Obesity, unspecified: Secondary | ICD-10-CM

## 2017-12-08 DIAGNOSIS — E871 Hypo-osmolality and hyponatremia: Secondary | ICD-10-CM

## 2017-12-08 DIAGNOSIS — D696 Thrombocytopenia, unspecified: Secondary | ICD-10-CM

## 2017-12-08 LAB — GLUCOSE, CAPILLARY
GLUCOSE-CAPILLARY: 142 mg/dL — AB (ref 70–99)
GLUCOSE-CAPILLARY: 143 mg/dL — AB (ref 70–99)
Glucose-Capillary: 140 mg/dL — ABNORMAL HIGH (ref 70–99)
Glucose-Capillary: 167 mg/dL — ABNORMAL HIGH (ref 70–99)

## 2017-12-08 NOTE — Progress Notes (Signed)
[place] PHYSICAL MEDICINE & REHABILITATION     PROGRESS NOTE  Subjective/Complaints:  Patient seen lying in bed this morning. He states he did not sleep well overnight due to left arm stiffness, however he states he is ready to begin therapies today.  ROS: deny CP, SOB, nausea, vomiting, diarrhea.  Objective: Vital Signs: Blood pressure 135/88, pulse 62, temperature 99.4 F (37.4 C), temperature source Oral, resp. rate 18, height 6\' 2"  (1.88 m), weight 132 kg (291 lb 1.6 oz), SpO2 98 %. No results found. Recent Labs    12/07/17 0939  WBC 6.8  HGB 15.6  HCT 46.7  PLT 138*   Recent Labs    12/07/17 0939  CREATININE 0.84   CBG (last 3)  Recent Labs    12/07/17 1707 12/07/17 2236 12/08/17 0645  GLUCAP 138* 152* 140*    Wt Readings from Last 3 Encounters:  12/07/17 132 kg (291 lb 1.6 oz)  12/06/17 133.4 kg (294 lb)    Physical Exam:  BP 135/88 (BP Location: Right Arm)   Pulse 62   Temp 99.4 F (37.4 C) (Oral)   Resp 18   Ht 6\' 2"  (1.88 m)   Wt 132 kg (291 lb 1.6 oz)   SpO2 98%   BMI 37.38 kg/m  Constitutional: He appearswell-developed.No distress.obese HENT: Normocephalicand atraumatic.  Eyes:EOMare normal. No discharge. Cardiovascular:Normal rateand regular rhythm. No JVD. Respiratory:Effort normal. Clear. ZO:XWRU.[id number]:Soft.nondistended. Musculoskeletal:left upper extremity tenderness. Neurological: He isalertand oriented. Dysarthria Left facial weakness Motor: LUE: 2+/5 proximal to distal LLE: 3-/5 proximal to distal RUE/RLE 5/5 proximal to distal. Skin: Skin iswarmand dry.  Psychiatric: He has anormal mood and affect. Hisbehavior is normal.  Assessment/Plan: 1. 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 as well as history of brain tumor which require 3+ hours per day of interdisciplinary therapy in a comprehensive inpatient rehab setting. Physiatrist is providing close team supervision and 24 hour management of  active medical problems listed below. Physiatrist and rehab team continue to assess barriers to discharge/monitor patient progress toward functional and medical goals.  Function:  Bathing Bathing position      Bathing parts      Bathing assist        Upper Body Dressing/Undressing Upper body dressing                    Upper body assist        Lower Body Dressing/Undressing Lower body dressing                                  Lower body assist        Toileting Toileting          Toileting assist     Transfers Chair/bed transfer             Locomotion Ambulation           Wheelchair          Cognition Comprehension Comprehension assist level: Follows basic conversation/direction with extra time/assistive device  Expression Expression assist level: Expresses basic needs/ideas: With extra time/assistive device  Social Interaction Social Interaction assist level: Interacts appropriately with others with medication or extra time (anti-anxiety, antidepressant).  Problem Solving    Memory      Medical Problem List and Plan: 1.Left side weakness with dysarthriasecondary to right MCA territory infarction as well as history of brain tumor many years ago with resection at  [facility]  Begin CIR  Notes reviewed, images reviewed, labs reviewed 2. DVT Prophylaxis/Anticoagulation: Subcutaneous Lovenox. Monitor for any bleeding episodes 3. Pain Management:Neurontin 100 mg 3 times daily, Ultram as needed 4. Mood:Prozac 20 mg daily, Seroquel 25 mg twice daily as needed 5. Neuropsych: This patientiscapable of making decisions on hisown behalf. 6. Skin/Wound Care:Routine skin checks 7. Fluids/Electrolytes/Nutrition:Routine in and outs 8.Asymptomatic carotid stenosis. Patient followed vascular surgery [facility] Dr.Velasquez Derrell Lolling. 9.Hypertension. Monitor with increased mobility. Patient on Norvasc 10 mg  daily, Zestoretic 20-12.5 mg daily prior to admission. Resume as needed  Monitor with increased mobility 10.Diabetes mellitus. Hemoglobin A1c 11.4.  -NovoLog 10 units 3 times daily, Lantus insulin 45 units daily. -Check blood sugars before meals and at bedtime. Diabetic teaching  Monitor with increased mobility 11.Tobacco abuse. NicoDerm patch. Counseling 12.Hyperlipidemia. Lipitor/fenofibrate 13.Medical noncompliance. Counseling 14.Morbid Obesity. BMI 37.38. Dietary follow-up 15. Hyponatremia  Sodium 126 on 6/25  Await repeat labs  Continue to monitor 16. Hypomagnesemia  Magnesium 1.5 on 6/25  Await labs 17. Thrombocytopenia  Platelets 138 on 6/20  Continue to monitor  LOS (Days) 1 A FACE TO FACE EVALUATION WAS PERFORMED  Ankit Karis Juba 12/08/2017 8:27 AM

## 2017-12-08 NOTE — Evaluation (Signed)
Occupational Therapy Assessment and Plan  Patient Details  Name: [facility] MRN: [id number] Date of Birth: [birth year]  OT Diagnosis: acute pain, hemiplegia affecting non-dominant side and muscle weakness (generalized) Rehab Potential: Rehab Potential (ACUTE ONLY): Good ELOS: 12-16 days   Today's Date: 12/08/2017 OT Individual Time: 3338-3291 OT Individual Time Calculation (min): 60 min     Problem List:  Patient Active Problem List   Diagnosis Date Noted  . Thrombocytopenia ([place])   . Hypomagnesemia   . Hyponatremia   . Diabetes mellitus type 2 in obese ([facility])   . Right middle cerebral artery stroke ([place]) 12/07/2017  . Left hemiparesis ([facility])   . Morbid obesity ([facility])   . Essential hypertension   . Stroke ([facility]) 12/02/2017  . Type 2 diabetes mellitus without complication ([facility]) 91/66/0600    Past Medical History:  Past Medical History:  Diagnosis Date  . Arthritis   . COPD (chronic obstructive pulmonary disease) ([facility])   . Diabetes mellitus without complication ([facility])   . DVT (deep venous thrombosis) ([facility])   . Hypertension   . Seizures ([facility])   . Stroke [facility]) 11/2017   Past Surgical History:  Past Surgical History:  Procedure Laterality Date  . BRAIN SURGERY    . BRAIN TUMOR EXCISION     performed at [facility]  . CATARACT EXTRACTION      Assessment & Plan Clinical Impression: Patient is a 52 y.o. right-handed male with history of COPD/tobacco abuse, carotid stenosis followed by vascular surgery at [facility] Dr. Dawson [facility] mellitus, DVT maintained on Eliquisin Zachary past, hypertension, brain tumor with excision 20 years ago and medical noncompliance as he has not taken his medicines for Zachary past 6 months. Patient lives with his wife. Used a straight point cane prior to admission. Wife can assist as needed. One level home with 3 steps to entry. Presented to [facility] 12/02/2017 with left-sided weakness and slurred speech.Urine drug  screen negative.Cranial CT scan showed postsurgical changes seen in Zachary right cerebellar hemisphere. No acute intracranial process. Mild chronic ischemic white matter disease. Patient did not receive TPA. MRI/MRA showed patchy areas of subacute infarction in Zachary right posterior MCA territory. Occlusion of Zachary right internal carotid artery through Zachary cavernous segment. Moderate stenosis right MCA bifurcation extending into Zachary M2 branches. Carotid Doppler showed right internal carotid artery occlusion. Greater than 70% stenosis in Zachary left ICA. Echocardiogram with ejection fraction of 60% no wall motion abnormalities. EEG negative seizure. Neurology services consulted maintained on aspirin and Plavix for CVA prophylaxis. Subcutaneous Lovenox for DVT prophylaxis. Tolerating a regular consistency diet. Physical and occupational therapy evaluations completed. Patient was admitted for a comprehensive rehab program.    Patient transferred to [facility] on 12/07/2017 .    Patient currently requires mod-total assist with basic self-care skills secondary to muscle weakness, impaired timing and sequencing, unbalanced muscle activation and decreased coordination, decreased attention to left and decreased sitting balance, decreased standing balance, decreased postural control, hemiplegia and decreased balance strategies.  Prior to hospitalization, patient could complete ADLs with independent .  Patient will benefit from skilled intervention to decrease level of assist with basic self-care skills and increase independence with basic self-care skills prior to discharge home with care partner.  Anticipate patient will require 24 hour supervision and follow up home health and follow up outpatient.  OT - End of Session Activity Tolerance: Tolerates 30+ min activity without fatigue Endurance Deficit: No OT Assessment Rehab Potential (ACUTE ONLY): Good OT Patient demonstrates impairments  in Zachary following  area(s): Balance;Motor;Pain;Perception;Safety;Sensory;Skin Integrity OT Basic ADL's Functional Problem(s): Grooming;Bathing;Dressing;Toileting OT Transfers Functional Problem(s): Toilet;Tub/Shower OT Additional Impairment(s): Fuctional Use of Upper Extremity OT Plan OT Intensity: Minimum of 1-2 x/day, 45 to 90 minutes OT Frequency: 5 out of 7 days OT Duration/Estimated Length of Stay: 12-16 days OT Treatment/Interventions: Balance/vestibular training;Discharge planning;Community reintegration;Disease Lawyer;Functional mobility training;Functional electrical stimulation;Neuromuscular re-education;Pain management;Patient/family education;Psychosocial support;Self Care/advanced ADL retraining;Skin care/wound managment;Splinting/orthotics;Therapeutic Activities;Therapeutic Exercise;UE/LE Strength taining/ROM;UE/LE Coordination activities OT Basic Self-Care Anticipated Outcome(s): Supervision OT Toileting Anticipated Outcome(s): Supervision OT Bathroom Transfers Anticipated Outcome(s): Supervision OT Recommendation Patient destination: Home Follow Up Recommendations: Home health OT;Outpatient OT;24 hour supervision/assistance Equipment Recommended: 3 in 1 bedside comode;Tub/shower bench   Skilled Therapeutic Intervention OT eval completed with discussion of rehab process, OT purpose, POC, ELOS, and goals.  ADL assessment completed with bathing and dressing from EOB.  Pt required min assist for bed mobility and min guard for dynamic sitting balance during bathing.  Pt declined LB bathing/dressing reporting nursing staff had completed post toileting this AM - total assist.  Min-mod assist sit > stand from EOB with focus on anterior weight shift for sit > stand and alternating weight shift as needed to lift each LE.  Stand pivot transfer to recliner with mod assist as pt required lowering assistance due to impulsivity.  Applied kinesiotape to Lt shoulder to  improve positioning and minimize subluxation as well as assist with pain management.  Provided pt with handout for proper positioning and discussed with pt and family then hung it over bed for nursing staff to assist with positioning.  Pt very motivated to regain UE ROM and fine motor control.  OT Evaluation Precautions/Restrictions  Precautions Precautions: Fall Restrictions Weight Bearing Restrictions: No Pain Pain Assessment Pain Scale: Faces Faces Pain Scale: Hurts a little bit Pain Type: Acute pain Pain Location: Arm Pain Orientation: Left Pain Descriptors / Indicators: Aching Pain Onset: On-going Patients Stated Pain Goal: 0 Pain Intervention(s): Repositioned 2nd Pain Site Wong-Baker Pain Rating: Hurts a little bit Pain Type: Acute pain Pain Location: Leg Pain Orientation: Left Pain Descriptors / Indicators: Burning Pain Frequency: Other (Comment)(first time occurence) Pain Onset: Unable to tell Pain Intervention(s): Medication (See eMAR);Repositioned(tramadol, gabapentin) Home Living/Prior [place] expects to be discharged to:: Private residence Living Arrangements: Spouse/significant other Available Help at Discharge: Family, Available 24 hours/day Type of Home: House Home Access: Stairs to enter Technical brewer of Steps: 3 Entrance Stairs-Rails: None Home Layout: Able to live on main level with bedroom/bathroom, Laundry or work area in basement [employer] Shower/Tub: Public librarian, Architectural technologist: Programmer, systems: Yes  Lives With: Spouse Prior Function Level of Independence: Independent with basic ADLs, Independent with homemaking with ambulation, Requires assistive device for independence(single point cane when knees hurt) Driving: Yes Comments: household and short distanced ambulator with SPC PRN ADL  See Function Navigator Vision Baseline Vision/History: No visual deficits Patient Visual  Report: No change from baseline Vision Assessment?: No apparent visual deficits Perception  Perception: Impaired Inattention/Neglect: Does not attend to left side of body(mild impairment) Cognition Overall Cognitive Status: History of cognitive impairments - at baseline(decreased memory since brain tumor removal) Arousal/Alertness: Awake/alert Orientation Level: Person;Place;Situation Person: Oriented Place: Oriented Situation: Oriented Year: 2019 Month: June Day of Week: Correct Memory: Appears intact Immediate Memory Recall: Sock;Blue;Bed Memory Recall: Sock;Bed Memory Recall Sock: With Cue Memory Recall Bed: With Cue Attention: Selective Selective Attention: Appears intact Awareness: Appears intact Problem Solving: Impaired Safety/Judgment: Impaired(impulsive) Sensation Sensation Light Touch: Impaired  Detail Light Touch Impaired Details: Impaired LUE Coordination Gross Motor Movements are Fluid and Coordinated: No Fine Motor Movements are Fluid and Coordinated: No Finger Nose Finger Test: unable to complete on Lt Extremity/Trunk Assessment RUE Assessment RUE Assessment: Within Functional Limits LUE Assessment LUE Assessment: Exceptions to [facility] Passive Range of Motion (PROM) Comments: WFL Active Range of Motion (AROM) Comments: shoulder elevation intact, no shoulder flexion against gravity, elbow and wrist WFL, loose gross grasp General Strength Comments: 1+/5 deltoid, 3+/5 bicep tricep,  LUE Body System: Neuro Brunstrum levels for arm and hand: Arm;Hand Brunstrum level for arm: Stage III Synergy is performed voluntarily Brunstrum level for hand: Stage III Synergies performed voluntarily;Stage IV Movements deviating from synergies   See Function Navigator for Current Functional Status.   Refer to Care Plan for Long Term Goals  Recommendations for other services: None    Discharge Criteria: Patient will be discharged from OT if patient refuses treatment 3  consecutive times without medical reason, if treatment goals not met, if there is a change in medical status, if patient makes no progress towards goals or if patient is discharged from [facility].  Zachary above assessment, treatment plan, treatment alternatives and goals were discussed and mutually agreed upon: by patient and by family  Helmer Dull, [facility] 12/08/2017, 11:58 AM

## 2017-12-08 NOTE — Plan of Care (Signed)
  Problem: Consults Goal: RH STROKE PATIENT EDUCATION Description See Patient Education module for education specifics  Outcome: Progressing Goal: Diabetes Guidelines if Diabetic/Glucose > 140 Description If diabetic or lab glucose is > 140 mg/dl - Initiate Diabetes/Hyperglycemia Guidelines & Document Interventions  Outcome: Progressing   Problem: RH BLADDER ELIMINATION Goal: RH STG MANAGE BLADDER WITH ASSISTANCE Description STG Manage Bladder With min Assistance  Outcome: Progressing   Problem: RH SKIN INTEGRITY Goal: RH STG SKIN FREE OF INFECTION/BREAKDOWN Description Patients skin will remain free from further breakdown or infection with min assist.  Outcome: Progressing   Problem: RH SAFETY Goal: RH STG ADHERE TO SAFETY PRECAUTIONS W/ASSISTANCE/DEVICE Description STG Adhere to Safety Precautions With supervision Assistance/Device.  Outcome: Progressing   Problem: RH PAIN MANAGEMENT Goal: RH STG PAIN MANAGED AT OR BELOW PT'S PAIN GOAL Description < 5  Outcome: Progressing   Problem: RH KNOWLEDGE DEFICIT Goal: RH STG INCREASE KNOWLEDGE OF DIABETES Description Min assist  Outcome: Progressing Goal: RH STG INCREASE KNOWLEDGE OF HYPERTENSION Description Min assist.  Outcome: Progressing

## 2017-12-08 NOTE — Evaluation (Signed)
Speech Language Pathology Assessment and Plan  Patient Details  Name: Zachary Deleon MRN: [id number] Date of Birth: 10-18-1965  SLP Diagnosis: Dysarthria;Cognitive Impairments  Rehab Potential: Good ELOS: 12-16 days   Today's Date: 12/08/2017 SLP Individual Time: 0900-1000 SLP Individual Time Calculation (min): 60 min   Problem List:  Patient Active Problem List   Diagnosis Date Noted  . Thrombocytopenia ([place])   . Hypomagnesemia   . Hyponatremia   . Diabetes mellitus type 2 in obese ([place])   . 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 Left hemiparesis ([place])   . Morbid obesity ([place])   . Essential hypertension   . Stroke ([place]) 12/02/2017  . Type 2 diabetes mellitus without complication ([place]) 14/43/1540   Past Medical History:  Past Medical History:  Diagnosis Date  . Arthritis   . COPD (chronic obstructive pulmonary disease) ([place])   . Diabetes mellitus without complication ([place])   . DVT (deep venous thrombosis) ([place])   . Hypertension   . Seizures ([place])   . Stroke [facility]) 11/2017   Past Surgical History:  Past Surgical History:  Procedure Laterality Date  . BRAIN SURGERY    . BRAIN TUMOR EXCISION     performed at [place] / Plan / Recommendation Clinical Impression Patient is a 52 y.o. right-handed male with history of COPD/tobacco abuse, carotid stenosis followed by vascular surgery at [facility] Zachary Deleon Bills mellitus, DVT maintained on Eliquisin the past, hypertension, brain tumor with excision 20 years ago and medical noncompliance as he has not taken his medicines for the past 6 months. Patient lives with his Zachary Deleon. Used a straight point cane prior to admission. Zachary Deleon can assist as needed. One level home with 3 steps to entry. Presented to [facility] 12/02/2017 with left-sided weakness and slurred speech.Urine drug screen negative.Cranial CT scan showed postsurgical changes  seen in the right cerebellar hemisphere. No acute intracranial process. Mild chronic ischemic white matter disease. Patient did not receive TPA. MRI/MRA showed patchy areas of subacute infarction in the right posterior MCA territory. 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 Moderate stenosis right MCA bifurcation extending into the M2 branches. Carotid Doppler showed right internal carotid artery occlusion. Greater than 70% stenosis in the left ICA. Echocardiogram with ejection fraction of 60% no wall motion abnormalities. EEG negative seizure. Neurology services consulted maintained on aspirin and Plavix for CVA prophylaxis. Subcutaneous Lovenox for DVT prophylaxis. Tolerating a regular consistency diet. Physical and occupational therapy evaluations completed. Patient was admitted for a comprehensive rehab program. Patient transferred to CIR on 12/07/2017.  Per informal and formal assessment measures, pt presents with minimal oral dysphagia c/b prolonged mastication/oral prep and A-P transit resulting in minimal to mild L oral pocketing in L lateral sulcus and buccal cavity. Pt able to independently clear oral residuals with Min A verbal cues to perform lingual sweep and liquid rinse. No s/sx concerning for aspiration noted following PO trials of thin liquids via cup and straw or regular diet textures. Continue to recommend regular diet with thin liquids given adherence to general aspiration precautions and intermittent supervision for implementation of strategies.   Cognitively, pt presents with moderate cognitive-linguistic deficits as indicated by a score of 17 out of 30 on the MoCA (n = greater than or equal to 26/30); however, please note, pt with baseline short-term memory deficits s/p brain tumor 20 years ago, which resulted, in part, to a  lower score. Nevertheless, he exhibited difficulty during problem-solving, executive functioning, and visuospatial  tasks and endorses acute changes in these areas s/p CVA with caregiver endorsing, as well. Receptive and expressive language appeared grossly intact with pt able to follow up to 3-step commands and name objects without difficulty. Re: speech, pt presents with mild-moderate dysarthria c/b imprecise articulation. In a quiet environment with a unfamiliar listener, he was subjectively noted to be ~75-80% intelligible at the sentence level. Given endorsement of new deficits in the areas of speech clarity and cognition, recommend initiation of skilled ST interventions to address above mentioned areas of concern in order to increase pt's independence and reduce caregiver burden upon d/c. ST to follow.    Skilled Therapeutic Interventions          Clinical swallow evaluation and cognitive-linguistic evaluation, as noted above.  SLP Assessment  Patient will need skilled Speech Lanaguage Pathology Services during CIR admission    Recommendations  SLP Diet Recommendations: Age appropriate regular solids Liquid Administration via: Cup;Straw Medication Administration: Whole meds with liquid Compensations: Minimize environmental distractions;Slow rate;Small sips/bites;Follow solids with liquid;Lingual sweep for clearance of pocketing Postural Changes and/or Swallow Maneuvers: Seated upright 90 degrees Oral Care Recommendations: Oral care BID Patient destination: Home    SLP Frequency 1 to 3 out of 7 days   SLP Duration  SLP Intensity  SLP Treatment/Interventions 12-16 days  Minumum of 1-2 x/day, 30 to 90 minutes  Cognitive remediation/compensation;Internal/external aids    Pain Pain Assessment Pain Scale: 0-10 Pain Score: 0-No pain Faces Pain Scale: No hurt Pain Type: Chronic pain Pain Location: Arm Pain Orientation: Left;Upper Pain Descriptors / Indicators: Constant;Aching Pain Frequency: Intermittent Pain Onset: On-going Patients Stated Pain Goal: 0 Pain Intervention(s): Medication (See  eMAR)(tramadol)  Prior Functioning Cognitive/Linguistic Baseline: Within functional limits Type of Home: House  Lives With: Spouse Available Help at Discharge: Family;Available 24 hours/day Vocation: On disability  Function:  Eating Eating   Modified Consistency Diet: No Eating Assist Level: Swallowing techniques: self managed           Cognition Comprehension Comprehension assist level: Follows basic conversation/direction with extra time/assistive device  Expression   Expression assist level: Expresses basic needs/ideas: With extra time/assistive device  Social Interaction Social Interaction assist level: Interacts appropriately with others with medication or extra time (anti-anxiety, antidepressant).  Problem Solving Problem solving assist level: Solves basic 90% of the time/requires cueing < 10% of the time  Memory Memory assist level: Recognizes or recalls 75 - 89% of the time/requires cueing 10 - 24% of the time   Short Term Goals: Week 1: SLP Short Term Goal 1 (Week 1): Pt will utilize speech intelligibility strategies to achieve 90%-100% intelligibility at the sentence level given supervision cues. SLP Short Term Goal 2 (Week 1): Pt will complete basic + functional problem-solving tasks including time management, basic calculations, and medication management given supervision cues.  SLP Short Term Goal 3 (Week 1): Pt will complete functional visuospatial/executive functioning tasks with 90% accuracy given supervision cues.  SLP Short Term Goal 4 (Week 1): Pt will selectively attend to task in a mildly distracting environment for up to 30 minutes with Mod I.  Refer to Care Plan for Long Term Goals  Recommendations for other services: None   Discharge Criteria: Patient will be discharged from SLP if patient refuses treatment 3 consecutive times without medical reason, if treatment goals not met, if there is a change in medical status, if patient makes no progress towards  goals or  if patient is discharged from hospital.  The above assessment, treatment plan, treatment alternatives and goals were discussed and mutually agreed upon: by patient and by family  Romelle Starcher A Shakia Sebastiano 12/08/2017, 5:59 PM

## 2017-12-08 NOTE — Evaluation (Signed)
Physical Therapy Assessment and Plan  Patient Details  Name: Zachary Deleon MRN: [id number] Date of Birth: [birth year]  PT Diagnosis: Abnormal posture, Abnormality of gait, Coordination disorder, Impaired cognition, Impaired sensation and Muscle weakness Rehab Potential: Good ELOS: 12-16 days    Today's Date: 12/08/2017 PT Individual Time: 1300-1415 PT Individual Time Calculation (min): 75 min    Problem List:  Patient Active Problem List   Diagnosis Date Noted  . Thrombocytopenia ([place])   . Hypomagnesemia   . Hyponatremia   . Diabetes mellitus type 2 in obese ([place])   . 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 Left hemiparesis ([place])   . Morbid obesity ([place])   . Essential hypertension   . Stroke ([place]) 12/02/2017  . Type 2 diabetes mellitus without complication ([place]) 63/33/5456    Past Medical History:  Past Medical History:  Diagnosis Date  . Arthritis   . COPD (chronic obstructive pulmonary disease) ([place])   . Diabetes mellitus without complication ([place])   . DVT (deep venous thrombosis) ([place])   . Hypertension   . Seizures ([place])   . Stroke [facility]) 11/2017   Past Surgical History:  Past Surgical History:  Procedure Laterality Date  . BRAIN SURGERY    . BRAIN TUMOR EXCISION     performed at [facility]  . CATARACT EXTRACTION      Assessment & Plan Clinical Impression: Patient is a 52 year old right-handed male with history of COPD/tobacco abuse, carotid stenosis followed by vascular surgery at [facility] Dr. Dawson Bills mellitus, DVT maintained on Eliquisin the past, hypertension, brain tumor with excision 20 years ago and medical noncompliance as he has not taken his medicines for the past 6 months. Patient lives with his wife. Used a straight point cane prior to admission. Wife can assist as needed. One level home with 3 steps to entry. Presented to [facility] 12/02/2017 with left-sided weakness and slurred speech.Urine  drug screen negative.Cranial CT scan showed postsurgical changes seen in the right cerebellar hemisphere. No acute intracranial process. Mild chronic ischemic white matter disease. Patient did not receive TPA. MRI/MRA showed patchy areas of subacute infarction in the right posterior MCA territory. 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 Moderate stenosis right MCA bifurcation extending into the M2 branches. Carotid Doppler showed right internal carotid artery occlusion. Greater than 70% stenosis in the left ICA. Echocardiogram with ejection fraction of 60% no wall motion abnormalities. EEG negative seizure. Neurology services consulted maintained on aspirin and Plavix for CVA prophylaxis. Subcutaneous Lovenox for DVT prophylaxis. Tolerating a regular consistency diet.    Patient transferred to CIR on 12/07/2017 .   Patient currently requires mod with mobility secondary to muscle weakness and muscle joint tightness, impaired timing and sequencing, unbalanced muscle activation, motor apraxia, decreased coordination and decreased motor planning, decreased attention to left and decreased motor planning, decreased attention, decreased awareness, decreased problem solving, decreased safety awareness, decreased memory and delayed processing and decreased sitting balance, decreased standing balance, decreased postural control, hemiplegia and decreased balance strategies.  Prior to hospitalization, patient was modified independent  with mobility and lived with Spouse in a House home.  Home access is 3Stairs to enter.  Patient will benefit from skilled PT intervention to maximize safe functional mobility, minimize fall risk and decrease caregiver burden for planned discharge home with 24 hour supervision.  Anticipate patient will benefit from follow up [place] at discharge.  PT - End of Session Activity Tolerance: Tolerates 10 - 20  min activity with multiple  rests Endurance Deficit: Yes PT Assessment Rehab Potential (ACUTE/IP ONLY): Good PT Barriers to Discharge: [place] home environment;Decreased caregiver support;Home environment access/layout;Medication compliance PT Patient demonstrates impairments in the following area(s): Balance;Behavior;Edema;Safety;Endurance;Motor;Nutrition;Pain;Sensory;Skin Integrity;Perception PT Transfers Functional Problem(s): Bed Mobility;Bed to Chair;Car;Furniture;Floor PT Locomotion Functional Problem(s): Ambulation;Wheelchair Mobility;Stairs PT Plan PT Intensity: Minimum of 1-2 x/day ,45 to 90 minutes PT Frequency: 5 out of 7 days PT Duration Estimated Length of Stay: 12-16 days  PT Treatment/Interventions: Ambulation/gait training;Community reintegration;DME/adaptive equipment instruction;Neuromuscular re-education;Psychosocial support;Stair training;UE/LE Strength taining/ROM;Wheelchair propulsion/positioning;UE/LE Coordination activities;Therapeutic Activities;Skin care/wound management;Functional electrical stimulation;Pain management;Discharge planning;Balance/vestibular training;Cognitive remediation/compensation;Disease management/prevention;Functional mobility training;Patient/family education;Splinting/orthotics;Therapeutic Exercise;Visual/perceptual remediation/compensation PT Transfers Anticipated Outcome(s): supervision assist with LRAD  PT Locomotion Anticipated Outcome(s): supervision assist at [facility] level and min assist ambulatory for short distances.  PT Recommendation Recommendations for Other Services: Therapeutic Recreation consult;Neuropsych consult Follow Up Recommendations: Home health PT Patient destination: Home Equipment Recommended: Rolling walker with 5" wheels;Wheelchair cushion (measurements);Wheelchair (measurements);To be determined  Skilled Therapeutic Intervention Pt received sitting on BSC and agreeable to PT. PT instructed patient in PT Evaluation and initiated treatment  intervention; see below for results. PT educated patient in [place], rehab potential, rehab goals, and discharge recommendations. Patient returned to room and left sitting in [facility] with call bell in reach and all needs met.     PT Evaluation Precautions/Restrictions Precautions Precautions: Fall Restrictions Weight Bearing Restrictions: No General   Vital SignsTherapy Vitals Temp: 98.5 F (36.9 C) Temp Source: Oral Pulse Rate: 67 Resp: 20 BP: (!) 150/87 Patient Position (if appropriate): Lying Oxygen Therapy SpO2: 97 % O2 Device: Room Air Pain Pain Assessment Pain Scale: Faces Faces Pain Scale: Hurts a little bit Pain Type: Acute pain Pain Location: Arm Pain Orientation: Left Pain Descriptors / Indicators: Aching Pain Onset: On-going Pain Intervention(s): Repositioned Home Living/Prior Functioning Home Living Available Help at Discharge: Family;Available 24 hours/day Type of Home: House Home Access: Stairs to enter [employer] of Steps: 3 Entrance Stairs-Rails: None Home Layout: Able to live on main level with bedroom/bathroom;Laundry or work area in basement [employer] Shower/Tub: Product/process development scientist: Programmer, systems: Yes  Lives With: Spouse Prior Function Level of Independence: Independent with basic ADLs;Independent with homemaking with ambulation;Requires assistive device for independence(single point cane when knees hurt)  Able to Take Stairs?: Yes Driving: Yes Vocation: On disability Comments: household and short distanced ambulator with SPC PRN due to knee pain  Vision/Perception  Perception Perception: Impaired Inattention/Neglect: Does not attend to left side of body Praxis Praxis: Impaired  Cognition Overall Cognitive Status: History of cognitive impairments - at baseline(decreased memory since brain tumor removal) Arousal/Alertness: Awake/alert Attention: Selective Selective Attention: Appears intact Memory:  Appears intact Awareness: Appears intact Problem Solving: Impaired Safety/Judgment: Impaired(impulsive) Sensation Sensation Light Touch: Impaired Detail Light Touch Impaired Details: Impaired LUE;Absent LLE Proprioception: Impaired Detail Proprioception Impaired Details: Impaired LLE;Impaired LUE Stereognosis: Impaired by gross assessment Coordination Gross Motor Movements are Fluid and Coordinated: No Fine Motor Movements are Fluid and Coordinated: No Finger Nose Finger Test: unable to complete on Lt Motor  Motor Motor: Hemiplegia Motor - Skilled Clinical Observations: dense L hemi  plegia UE>LE  Mobility Bed Mobility Bed Mobility: Rolling Right;Rolling Left;Supine to Sit;Sit to Supine Rolling Right: Minimal Assistance - Patient > 75% Rolling Left: Minimal Assistance - Patient > 75% Supine to Sit: Moderate Assistance - Patient 50-74% Sit to Supine: Moderate Assistance - Patient 50-74% Transfers Transfers: Sit to Stand;Stand to Sit;Stand Pivot Transfers Sit to Stand: Moderate Assistance - Patient 50-74%  Stand to Sit: Moderate Assistance - Patient 50-74% Stand Pivot Transfers: Moderate Assistance - Patient 50 - 74% Stand Pivot Transfer Details: Verbal cues for precautions/safety;Verbal cues for gait pattern;Verbal cues for safe use of DME/AE;Manual facilitation for weight shifting;Manual facilitation for placement  Car trasnfer: moderate Assistance moderate cues for safety and technique.  Locomotion  Gait Ambulation: Yes Gait Assistance: 2 Helpers Gait Distance (Feet): 30 Feet Assistive device: Other (Comment)(rail in hall ) Gait Gait: Yes Gait Pattern: Impaired Gait Pattern: Step-to pattern;Ataxic  Trunk/Postural Assessment  Cervical Assessment Cervical Assessment: Within Functional Limits Thoracic Assessment Thoracic Assessment: Exceptions to [facility] Lumbar Assessment Lumbar Assessment: Exceptions to [facility] Postural Control Postural Control: Deficits on evaluation   Balance Balance Balance Assessed: Yes Dynamic Sitting Balance Dynamic Sitting - Level of Assistance: 5: Stand by assistance Static Standing Balance Static Standing - Level of Assistance: 3: Mod assist Dynamic Standing Balance Dynamic Standing - Level of Assistance: 2: Max assist Extremity Assessment  RUE Assessment RUE Assessment: Within Functional Limits LUE Assessment LUE Assessment: Exceptions to [facility] Passive Range of Motion (PROM) Comments: WFL Active Range of Motion (AROM) Comments: shoulder elevation intact, no shoulder flexion against gravity, elbow and wrist WFL, loose gross grasp General Strength Comments: 1+/5 deltoid, 3+/5 bicep tricep,  LUE Body System: Neuro Brunstrum levels for arm and hand: Arm;Hand Brunstrum level for arm: Stage III Synergy is performed voluntarily Brunstrum level for hand: Stage III Synergies performed voluntarily;Stage IV Movements deviating from synergies RLE Assessment RLE Assessment: Within Functional Limits LLE Assessment LLE Assessment: Exceptions to [facility] General Strength Comments: Grossly 4/5 proximal to distal with delayed activation of all muscle groups    See Function Navigator for Current Functional Status.   Refer to Care Plan for Long Term Goals  Recommendations for other services: Neuropsych and Therapeutic Recreation  Stress management  Discharge Criteria: Patient will be discharged from PT if patient refuses treatment 3 consecutive times without medical reason, if treatment goals not met, if there is a change in medical status, if patient makes no progress towards goals or if patient is discharged from hospital.  The above assessment, treatment plan, treatment alternatives and goals were discussed and mutually agreed upon: by patient  Lorie Phenix 12/08/2017, 2:22 PM

## 2017-12-09 ENCOUNTER — Inpatient Hospital Stay (HOSPITAL_COMMUNITY): Payer: Medicare Other

## 2017-12-09 ENCOUNTER — Inpatient Hospital Stay (HOSPITAL_COMMUNITY): Payer: Medicare Other | Admitting: Occupational Therapy

## 2017-12-09 LAB — GLUCOSE, CAPILLARY
GLUCOSE-CAPILLARY: 134 mg/dL — AB (ref 70–99)
GLUCOSE-CAPILLARY: 130 mg/dL — AB (ref 70–99)
GLUCOSE-CAPILLARY: 177 mg/dL — AB (ref 70–99)
GLUCOSE-CAPILLARY: 177 mg/dL — AB (ref 70–99)

## 2017-12-09 MED ORDER — MELATONIN 3 MG PO TABS
1.5000 mg | ORAL_TABLET | Freq: Every evening | ORAL | Status: DC | PRN
  Administered 2017-12-09 – 2017-12-20 (×11): 1.5 mg via ORAL
  Filled 2017-12-09 (×14): qty 0.5

## 2017-12-09 MED ORDER — NON FORMULARY: 1.5000 mg | Freq: Every evening | Status: DC | PRN

## 2017-12-09 NOTE — Progress Notes (Signed)
Physical Therapy Session Note  Patient Details  Name: Zachary Deleon MRN: [id number] Date of Birth: 04-Jul-1965  Today's Date: 12/09/2017 PT Individual Time: 1015-1100 PT Individual Time Calculation (min): 45 min   Short Term Goals: Week 1:  PT Short Term Goal 1 (Week 1): pt will perform bed<>WC transfers with min assist consistently  PT Short Term Goal 2 (Week 1): Pt will propell WC 16900ft with supervision assist  PT Short Term Goal 3 (Week 1): Pt will ambulate 7230ft with mod assist and LRAD  PT Short Term Goal 4 (Week 1): Pt will perform bed mobiltiy with min assist with sit<>supine and minimal use of bed rails.   Skilled Therapeutic Interventions/Progress Updates:    Pt seated EOB upon PT arrival, agreeable to therapy tx and denies pain. Pt transferred to supine with supervision and back to sitting EOB on opposite side of bed with supervision. Pt performed stand pivot transfer from bed>w/c with min assist, verbal cues for techniques/safety. Pt transported to the gym. Pt performed sit<>stands from w/c with RW and min assist, verbal cues for techniques. Pt performed pre-gait in place with RW and min assist, tactile cues/manual facilitation to prevent excessive L knee hyperextension. Pt ambulated x 60 ft with RW and mod assist +2 for w/c follow for safety. L knee hyperextension noted during stance with tactile cues to minimize, verbal cues for step length. Pt performed x 10 sit<>stands from edge of mat without UE support, min assist with emphasis on symmetric LE weightbearing. Pt performed 2 x 10 mini squats in standing with focus on R knee control, tactile cues to prevent hyperextension. Pt performed stand pivot back to w/c with mod assist and transported back to room. Pt transferred to bed stand pivot mod assist and left seated EOB in care of family.   Therapy Documentation Precautions:  Precautions Precautions: Fall Restrictions Weight Bearing Restrictions: No   See Function Navigator for  Current Functional Status.   Therapy/Group: Individual Therapy  Cresenciano GenreEmily van Schagen, PT, DPT 12/09/2017, 7:53 AM

## 2017-12-09 NOTE — Progress Notes (Addendum)
Occupational Therapy Session Note  Patient Details  Name: Lucianne Leilton J Mendez MRN: [id number] Date of Birth: 04-22-1966  Today's Date: 12/09/2017 OT Individual Time: [id number] OT Individual Time Calculation (min): 40 min   Short Term Goals: Week 1:  OT Short Term Goal 1 (Week 1): Pt will complete bathing with min assist at sit > stand level OT Short Term Goal 2 (Week 1): Pt will complete 2/3 toileting tasks with min assist OT Short Term Goal 3 (Week 1): Pt will complete LB dressing with mod assist at sit > stand level OT Short Term Goal 4 (Week 1): Pt will utilize LUE as gross assist when opening containers to complete self-care tasks  Skilled Therapeutic Interventions/Progress Updates:    Pt greeted EOB. Just finished breakfast with son present. Started session with pt donning shirt. He required Min A with max vcs for hemi techniques and slowing down due to impulsivity. Stand pivot<w/c completed with Mod A and Lt side supported. While in gym, worked on equal LE weightbearing and L UE NMR via cone stacking/flipping task standing at elevated table. Pt able to grasp cones, stack, and flip with OT support for stabilization due to coordination deficits/knocking items over. Mod vcs for weight shifting towards Lt side which pt attributes to FOF due to fall hx during hospitalization. Discussed at length need for slowing down functional movements to maximize safety and functional return. Talked about quality vs speed of movement when completing L UE gross motor tasks and transfers. He appeared receptive to education, with exhibited carryover during stand pivot transfer back to bed at end of session. He was left with son and all needs.   Also gave him foam sponge for working on controlled grasp/release outside of therapies.   Therapy Documentation Precautions:  Precautions Precautions: Fall Restrictions Weight Bearing Restrictions: No Vital Signs: Therapy Vitals Temp: 97.9 F (36.6 C) Temp Source:  Oral Pulse Rate: 60 Resp: 17 BP: (!) 143/77 Patient Position (if appropriate): Lying Oxygen Therapy SpO2: 97 % O2 Device: Room Air Pain: In Lt shoulder. Premedicated. "Honey, I feel drunk as a skunk from this stuff."   Pain Assessment Pain Scale: 0-10 Pain Score: 6  Pain Type: Acute pain Pain Location: Arm Pain Orientation: Left Pain Descriptors / Indicators: Aching Pain Frequency: Intermittent Pain Onset: On-going Patients Stated Pain Goal: 1 Pain Intervention(s): Medication (See eMAR) Multiple Pain Sites: No ADL:      See Function Navigator for Current Functional Status.   Therapy/Group: Individual Therapy  Artia Singley A Kip Kautzman 12/09/2017, 8:44 AM

## 2017-12-09 NOTE — Progress Notes (Signed)
This AM C/O spasms to lower back. PRN ultram given for C/O HA. Reports pain when voiding. Urine clear, no odor. Zachary Deleon, [name] A

## 2017-12-09 NOTE — Progress Notes (Signed)
Rt at bedside, asked pt about wearing CPAP.  Pt stated he doesn't wear CPAP at night.

## 2017-12-09 NOTE — Progress Notes (Signed)
[place] PHYSICAL MEDICINE & REHABILITATION     PROGRESS NOTE  Subjective/Complaints:  Pt seen sitting up in bed this AM.  He states he slept better overnight.  He continues to have left arm pain.  He states he had a good first day of therapies.    ROS: Denies  CP, SOB, nausea, vomiting, diarrhea.  Objective: Vital Signs: Blood pressure (!) 143/77, pulse 60, temperature 97.9 F (36.6 C), temperature source Oral, resp. rate 17, height 6\' 2"  (1.88 m), weight 132 kg (291 lb 1.6 oz), SpO2 97 %. No results found. Recent Labs    12/07/17 0939  WBC 6.8  HGB 15.6  HCT 46.7  PLT 138*   Recent Labs    12/07/17 0939  CREATININE 0.84   CBG (last 3)  Recent Labs    12/08/17 2124 12/09/17 0640 12/09/17 1148  GLUCAP 143* 134* 130*    Wt Readings from Last 3 Encounters:  12/07/17 132 kg (291 lb 1.6 oz)  12/06/17 133.4 kg (294 lb)    Physical Exam:  BP (!) 143/77 (BP Location: Right Arm)   Pulse 60   Temp 97.9 F (36.6 C) (Oral)   Resp 17   Ht 6\' 2"  (1.88 m)   Wt 132 kg (291 lb 1.6 oz)   SpO2 97%   BMI 37.38 kg/m  Constitutional: He appearswell-developed.No distress.obese HENT: Normocephalicand atraumatic.  Eyes:EOMare normal. No discharge. Cardiovascular:RRR. No JVD. Respiratory:Effort normal. Clear. QI:ONGE.[id number]. Musculoskeletal: Left upper extremity tenderness. Neurological: He isalertand oriented. Dysarthria Left facial weakness Motor: LUE: 2+/5 proximal to distal (stable) LLE: 3-/5 proximal to distal RUE/RLE 5/5 proximal to distal. Skin: Skin iswarmand dry.  Psychiatric: He has anormal mood and affect. Hisbehavior is normal.  Assessment/Plan: 1. 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 as well as history of brain tumor which require 3+ hours per day of interdisciplinary therapy in a comprehensive inpatient rehab setting. Physiatrist is providing close team supervision and 24 hour management of active medical  problems listed below. Physiatrist and rehab team continue to assess barriers to discharge/monitor patient progress toward functional and medical goals.  Function:  Bathing Bathing position   Position: Sitting EOB  Bathing parts Body parts bathed by patient: Left arm, Chest, Abdomen Body parts bathed by helper: Front perineal area, Buttocks, Right upper leg, Right arm, Left upper leg, Right lower leg, Left lower leg, Back  Bathing assist Assist Level: (Max assist)      Upper Body Dressing/Undressing Upper body dressing   What is the patient wearing?: Pull over shirt/dress     Pull over shirt/dress - Perfomed by patient: Thread/unthread right sleeve, Put head through opening, Pull shirt over trunk Pull over shirt/dress - Perfomed by helper: Thread/unthread left sleeve        Upper body assist Assist Level: Touching or steadying assistance(Pt > 75%)   Set up : To obtain clothing/put away  Lower Body Dressing/Undressing Lower body dressing   What is the patient wearing?: Underwear, Pants, Non-skid slipper socks   Underwear - Performed by helper: Thread/unthread right underwear leg, Thread/unthread left underwear leg, Pull underwear up/down   Pants- Performed by helper: Thread/unthread right pants leg, Thread/unthread left pants leg, Pull pants up/down   Non-skid slipper socks- Performed by helper: Don/doff right sock, Don/doff left sock                  Lower body assist Assist for lower body dressing: (Total assist)      Toileting Toileting  Toileting assist     Transfers Chair/bed transfer   Chair/bed transfer method: Stand pivot Chair/bed transfer assist level: Touching or steadying assistance (Pt > 75%) Chair/bed transfer assistive device: Armrests, Bedrails     Locomotion Ambulation     Max distance: 60 ft Assist level: 2 helpers(pt mod assist, +2 for w/c follow)   Wheelchair   Type: Manual Max wheelchair distance: 15600ft     Cognition Comprehension Comprehension assist level: Follows basic conversation/direction with extra time/assistive device  Expression Expression assist level: Expresses basic needs/ideas: With extra time/assistive device  Social Interaction Social Interaction assist level: Interacts appropriately with others with medication or extra time (anti-anxiety, antidepressant).  Problem Solving Problem solving assist level: Solves basic 90% of the time/requires cueing < 10% of the time  Memory Memory assist level: Recognizes or recalls 75 - 89% of the time/requires cueing 10 - 24% of the time    Medical Problem List and Plan: 1.Left side weakness with dysarthriasecondary to right MCA territory infarction as well as history of brain tumor many years ago [facility]  Cont CIR 2. DVT Prophylaxis/Anticoagulation: Subcutaneous Lovenox. Monitor for any bleeding episodes 3. Pain Management:Neurontin 100 mg 3 times daily, Ultram as needed 4. Mood:Prozac 20 mg daily, Seroquel 25 mg twice daily as needed 5. Neuropsych: This patientiscapable of making decisions on hisown behalf. 6. Skin/Wound Care:Routine skin checks 7. Fluids/Electrolytes/Nutrition:Routine in and outs 8.Asymptomatic carotid stenosis. [facility] Dr.Velasquez Derrell Lollingamirez. 9.Hypertension. Monitor with increased mobility. Patient on Norvasc 10 mg daily, Zestoretic 20-12.5 mg daily prior to admission. Resume as needed  Elevated, but ?improving 10.Diabetes mellitus. Hemoglobin A1c 11.4.  -NovoLog 10 units 3 times daily, Lantus insulin 45 units daily. -Check blood sugars before meals and at bedtime. Diabetic teaching  Improving on 6/30 11.Tobacco abuse. NicoDerm patch. Counseling 12.Hyperlipidemia. Lipitor/fenofibrate 13.Medical noncompliance. Counseling 14.Morbid Obesity. BMI 37.38. Dietary follow-up 15.  Hyponatremia  Sodium 126 on 6/25  Labs pending  Continue to monitor 16. Hypomagnesemia  Magnesium 1.5 on 6/25  Labs pending 17. Thrombocytopenia  Platelets 138 on 6/28  Labs pending  Continue to monitor  LOS (Days) 2 A FACE TO FACE EVALUATION WAS PERFORMED  Ankit Karis Jubanil Patel 12/09/2017 12:30 PM

## 2017-12-09 NOTE — Progress Notes (Signed)
PRN tylenol given at 2315 for C/O HA. PRN ultram given at 2341, for C/O right shoulder pain. Family at bedside.Alfredo MartinezMurray, Gracin Mcpartland A

## 2017-12-10 ENCOUNTER — Inpatient Hospital Stay (HOSPITAL_COMMUNITY): Payer: Medicare Other | Admitting: Physical Therapy

## 2017-12-10 ENCOUNTER — Inpatient Hospital Stay (HOSPITAL_COMMUNITY): Payer: Medicare Other | Admitting: Speech Pathology

## 2017-12-10 ENCOUNTER — Inpatient Hospital Stay (HOSPITAL_COMMUNITY): Payer: Medicare Other | Admitting: Occupational Therapy

## 2017-12-10 LAB — CBC WITH DIFFERENTIAL/PLATELET
Abs Immature Granulocytes: 0 10*3/uL (ref 0.0–0.1)
Basophils Absolute: 0.1 10*3/uL (ref 0.0–0.1)
Basophils Relative: 1 %
EOS ABS: 0.1 10*3/uL (ref 0.0–0.7)
EOS PCT: 1 %
HEMATOCRIT: 46 % (ref 39.0–52.0)
Hemoglobin: 15 g/dL (ref 13.0–17.0)
Immature Granulocytes: 0 %
LYMPHS ABS: 2.5 10*3/uL (ref 0.7–4.0)
Lymphocytes Relative: 31 %
MCH: 29 pg (ref 26.0–34.0)
MCHC: 32.6 g/dL (ref 30.0–36.0)
MCV: 88.8 fL (ref 78.0–100.0)
MONOS PCT: 8 %
Monocytes Absolute: 0.7 10*3/uL (ref 0.1–1.0)
Neutro Abs: 4.5 10*3/uL (ref 1.7–7.7)
Neutrophils Relative %: 59 %
Platelets: 131 10*3/uL — ABNORMAL LOW (ref 150–400)
RBC: 5.18 MIL/uL (ref 4.22–5.81)
RDW: 13.5 % (ref 11.5–15.5)
WBC: 7.8 10*3/uL (ref 4.0–10.5)

## 2017-12-10 LAB — COMPREHENSIVE METABOLIC PANEL
ALT: 26 U/L (ref 0–44)
ANION GAP: 6 (ref 5–15)
AST: 25 U/L (ref 15–41)
Albumin: 3.3 g/dL — ABNORMAL LOW (ref 3.5–5.0)
Alkaline Phosphatase: 65 U/L (ref 38–126)
BILIRUBIN TOTAL: 0.6 mg/dL (ref 0.3–1.2)
BUN: 12 mg/dL (ref 6–20)
CO2: 27 mmol/L (ref 22–32)
Calcium: 8.6 mg/dL — ABNORMAL LOW (ref 8.9–10.3)
Chloride: 101 mmol/L (ref 98–111)
Creatinine, Ser: 0.83 mg/dL (ref 0.61–1.24)
GFR calc Af Amer: >60 mL/min (ref >60)
GFR calc non Af Amer: >60 mL/min (ref >60)
Glucose, Bld: 159 mg/dL — ABNORMAL HIGH (ref 70–99)
POTASSIUM: 3.5 mmol/L (ref 3.5–5.1)
Sodium: 134 mmol/L — ABNORMAL LOW (ref 135–145)
TOTAL PROTEIN: 6 g/dL — AB (ref 6.5–8.1)

## 2017-12-10 LAB — MAGNESIUM: MAGNESIUM: 1.6 mg/dL — AB (ref 1.7–2.4)

## 2017-12-10 LAB — GLUCOSE, CAPILLARY
Glucose-Capillary: 146 mg/dL — ABNORMAL HIGH (ref 70–99)
Glucose-Capillary: 118 mg/dL — ABNORMAL HIGH (ref 70–99)
Glucose-Capillary: 122 mg/dL — ABNORMAL HIGH (ref 70–99)
Glucose-Capillary: 167 mg/dL — ABNORMAL HIGH (ref 70–99)

## 2017-12-10 MED ORDER — BLOOD PRESSURE CONTROL BOOK
Freq: Once | Status: AC
  Administered 2017-12-10: 13:00:00
  Filled 2017-12-10: qty 1

## 2017-12-10 MED ORDER — LIDOCAINE 5 % EX PTCH
1.0000 | MEDICATED_PATCH | CUTANEOUS | Status: DC
  Administered 2017-12-10 – 2017-12-18 (×9): 1 via TRANSDERMAL
  Filled 2017-12-10 (×11): qty 1

## 2017-12-10 MED ORDER — LIVING WELL WITH DIABETES BOOK
Freq: Once | Status: AC
  Administered 2017-12-10: 13:00:00
  Filled 2017-12-10: qty 1

## 2017-12-10 MED ORDER — ENOXAPARIN SODIUM 80 MG/0.8ML ~~LOC~~ SOLN
65.0000 mg | SUBCUTANEOUS | Status: DC
  Administered 2017-12-10 – 2017-12-19 (×11): 65 mg via SUBCUTANEOUS
  Filled 2017-12-10 (×2): qty 0.65
  Filled 2017-12-10: qty 0.8
  Filled 2017-12-10 (×8): qty 0.65

## 2017-12-10 NOTE — Care Management Note (Signed)
Inpatient Rehabilitation Center Individual Statement of Services  Patient Name:  Zachary Deleon  Date:  12/10/2017  Welcome to the Inpatient Rehabilitation Center.  Our goal is to provide you with an individualized program based on your diagnosis and situation, designed to meet your specific needs.  With this comprehensive rehabilitation program, you will be expected to participate in at least 3 hours of rehabilitation therapies Monday-Friday, with modified therapy programming on the weekends.  Your rehabilitation program will include the following services:  Physical Therapy (PT), Occupational Therapy (OT), Speech Therapy (ST), 24 hour per day rehabilitation nursing, Therapeutic Recreaction (TR), Neuropsychology, Case Management (Social Worker), Rehabilitation Medicine, Nutrition Services and Pharmacy Services  Weekly team conferences will be held on Wednesday to discuss your progress.  Your Social Worker will talk with you frequently to get your input and to update you on team discussions.  Team conferences with you and your family in attendance may also be held.  Expected length of stay: 12-16 days Overall anticipated outcome: supervision-min assist level  Depending on your progress and recovery, your program may change. Your Social Worker will coordinate services and will keep you informed of any changes. Your Social Worker's name and contact numbers are listed  below.  The following services may also be recommended but are not provided by the Inpatient Rehabilitation Center:   Driving Evaluations  Home Health Rehabiltiation Services  Outpatient Rehabilitation Services    Arrangements will be made to provide these services after discharge if needed.  Arrangements include referral to agencies that provide these services.  Your insurance has been verified to be:  Medicare A & B Your primary doctor is:  Cristopher EstimableRobin Gibson-NP  Pertinent information will be shared with your doctor and your  insurance company.  Social Worker:  Dossie DerBecky Nilo Fallin, SW [phone] or ([phone]) [phone]  Information discussed with and copy given to patient by: Lucy Chrisupree, Jalayah Gutridge G, 12/10/2017, 9:54 AM

## 2017-12-10 NOTE — IPOC Note (Signed)
Overall Plan of Care [facility]) Patient Details Name: Zachary Deleon MRN: [id number] DOB: [birth year]  Admitting Diagnosis: <principal problem not specified>  Hospital Problems: Active Problems:   Right middle cerebral artery stroke (HCC)   Left hemiparesis (HCC)   Morbid obesity (HCC)   Essential hypertension   Thrombocytopenia (HCC)   Hypomagnesemia   Hyponatremia   Diabetes mellitus type 2 in obese [facility])     Functional Problem List: Nursing Bladder, Endurance, Medication Management, Motor, Nutrition, Pain, Safety, Skin Integrity, Sensory  PT Balance, Behavior, Edema, Safety, Endurance, Motor, Nutrition, Pain, Sensory, Skin Integrity, Perception  OT Balance, Motor, Pain, Perception, Safety, Sensory, Skin Integrity  SLP Cognition  TR         Basic ADL's: OT Grooming, Bathing, Dressing, Toileting     Advanced  ADL's: OT       Transfers: PT Bed Mobility, Bed to Chair, Car, [employer], Civil Service fast streamer, Research scientist (life sciences): PT Ambulation, Psychologist, prison and probation services, Stairs     Additional Impairments: OT Fuctional Use of Upper Extremity  SLP        TR      Anticipated Outcomes Item Anticipated Outcome  Self Feeding    Swallowing  Mod I   Basic self-care  Supervision  Toileting  Supervision   Bathroom Transfers Supervision  Bowel/Bladder  Min assist  Transfers  supervision assist with LRAD   Locomotion  supervision assist at [facility] level and min assist ambulatory for short distances.   Communication  Mod I  Cognition  Mod I  Pain  < 5  Safety/Judgment  Supervision   Therapy Plan: PT Intensity: Minimum of 1-2 x/day ,45 to 90 minutes PT Frequency: 5 out of 7 days PT Duration Estimated Length of Stay: 12-16 days  OT Intensity: Minimum of 1-2 x/day, 45 to 90 minutes OT Frequency: 5 out of 7 days OT Duration/Estimated Length of Stay: 12-16 days SLP Intensity: Minumum of 1-2 x/day, 30 to 90 minutes SLP Frequency: 1 to 3 out of 7 days SLP  Duration/Estimated Length of Stay: 12-16 days    Team Interventions: Nursing Interventions Patient/Family Education, Bladder Management, Disease Management/Prevention, Skin Care/Wound Management, Medication Management, Pain Management  PT interventions Ambulation/gait training, Community reintegration, DME/adaptive equipment instruction, Neuromuscular re-education, Psychosocial support, Stair training, UE/LE Strength taining/ROM, Wheelchair propulsion/positioning, UE/LE Coordination activities, Therapeutic Activities, Skin care/wound management, Functional electrical stimulation, Pain management, Discharge planning, Warden/ranger, Cognitive remediation/compensation, Disease management/prevention, Functional mobility training, Patient/family education, Splinting/orthotics, Therapeutic Exercise, Visual/perceptual remediation/compensation  OT Interventions Warden/ranger, Discharge planning, Community reintegration, Disease mangement/prevention, DME/adaptive equipment instruction, Functional mobility training, Functional electrical stimulation, Neuromuscular re-education, Pain management, Patient/family education, Psychosocial support, Self Care/advanced ADL retraining, Skin care/wound managment, Splinting/orthotics, Therapeutic Activities, Therapeutic Exercise, UE/LE Strength taining/ROM, UE/LE Coordination activities  SLP Interventions Cognitive remediation/compensation, Internal/external aids  TR Interventions    SW/CM Interventions Discharge Planning, Patient/Family Education, Psychosocial Support   Barriers to Discharge MD  Medical stability  Nursing New diabetic, Medication compliance    PT Inaccessible home environment, Decreased caregiver support, Home environment access/layout, Medication compliance    OT      SLP      SW Medication compliance needs to take meds at home   Team Discharge Planning: Destination: PT-Home ,OT- Home , SLP-Home Projected Follow-up:  PT-Home health PT, OT-  Home health OT, Outpatient OT, 24 hour supervision/assistance, SLP-  Projected Equipment Needs: PT-Rolling walker with 5" wheels, Wheelchair cushion (measurements), Wheelchair (measurements), To be determined, OT- 3 in 1 bedside comode, Tub/shower  bench, SLP-  Equipment Details: PT- , OT-  Patient/family involved in discharge planning: PT- Patient, Family member/caregiver,  OT-Patient, Family member/caregiver, SLP-Patient, Family member/caregiver  MD ELOS: 17-21d Medical Rehab Prognosis:  Good Assessment:  52 year old right-handed male with history of COPD/tobacco abuse, carotid stenosis [facility] Dr. Sharilyn SitesVelasquezRamirez,diabetes mellitus, DVT maintained on Eliquisin the past, hypertension, brain tumor with excision 20 years ago and medical noncompliance as he has not taken his medicines for the past 6 months. Patient lives with his wife. Used a straight point cane prior to admission. Wife can assist as needed. One level home with 3 steps to entry. [facility]2017 with left-sided weakness and slurred speech.Urine drug screen negative.Cranial CT scan showed postsurgical changes seen in the right cerebellar hemisphere. No acute intracranial process. Mild chronic ischemic white matter disease. Patient did not receive TPA. MRI/MRA showed patchy areas of subacute infarction in the right posterior MCA territory. 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 Moderate stenosis right MCA bifurcation extending into the M2 branches. Carotid Doppler showed right internal carotid artery occlusion. Greater than 70% stenosis in the left ICA. Echocardiogram with ejection fraction of 60% no wall motion abnormalities. EEG negative seizure. Neurology services consulted     Now requiring 24/7 Rehab RN,MD, as well as CIR level PT, OT and SLP.  Treatment team will focus on ADLs and  mobility with [facility]  See Team Conference Notes for weekly updates to the plan of care

## 2017-12-10 NOTE — Progress Notes (Signed)
Social Work  Social Work Assessment and Plan  Patient Details  Name: Zachary Deleon MRN: [id number] Date of Birth: 11/30/1965  Today's Date: 12/10/2017  Problem List:  Patient Active Problem List   Diagnosis Date Noted  . Thrombocytopenia (HCC)   . Hypomagnesemia   . Hyponatremia   . Diabetes mellitus type 2 in obese (HCC)   . 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 Left hemiparesis (HCC)   . Morbid obesity (HCC)   . Essential hypertension   . Stroke (HCC) 12/02/2017  . Type 2 diabetes mellitus without complication (HCC) 12/02/2017   Past Medical History:  Past Medical History:  Diagnosis Date  . Arthritis   . COPD (chronic obstructive pulmonary disease) (HCC)   . Diabetes mellitus without complication (HCC)   . DVT (deep venous thrombosis) (HCC)   . Hypertension   . Seizures (HCC)   . Stroke [facility]) 11/2017   Past Surgical History:  Past Surgical History:  Procedure Laterality Date  . BRAIN SURGERY    . BRAIN TUMOR EXCISION     per[facility]  . CATARACT EXTRACTION     Social History:  reports that he has been smoking cigarettes.  He has been smoking about 2.00 packs per day. He has never used smokeless tobacco. He reports that he drinks alcohol. He reports that he has current or past drug history.  Family / Support Systems Marital Status: Married Patient Roles: Spouse, Parent Spouse/Significant Other: [name] [phone] Children: Two daughter's in the home grown and son in [place] Other Supports: Friends Anticipated Caregiver: Wife Ability/Limitations of Caregiver: No limitations doesn't work Engineer, structural Availability: 24/7 Family Dynamics: Close knit family close with three children who are supportive. Both daughter's are involved and in the home but do work. They have friends who are involved and will visit him. 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 Wife stays here with him and is involved.   Abuse/Neglect Abuse/Neglect Assessment Can Be Completed: Yes Physical Abuse: Denies Verbal Abuse: Denies Sexual Abuse: Denies Exploitation of patient/patient's resources: Denies Self-Neglect: Denies  Emotional Status Pt's affect, behavior adn adjustment status: Pt is motivated to do well and recover from this stroke. He has made progress in therapies and is hopeful he will do well here. He wants to get back to his baseline functioning at independent level. Wife will assist but pt does not want her too. Recent Psychosocial Issues: other health issues-COPD and brain surgery 20 years ago Pyschiatric History: history of depression takes prozac which he finds helpful, feel pt would benefit from seeing neuro-psych when here. Will make referral for this. Substance Abuse History: Tobacco aware needs to quit but unsure it he will. He is aware of the health issues and resources in the commuity available to him. Wil continue to address while here.  Patient / Family Perceptions, Expectations & Goals Pt/Family understanding of illness & functional limitations: Pt and wife can explain his stroke and deficits. Both have spoken with the MD and feel they have a good understanding of his stroke and deficits. He does talk with MD daily and feels his questions are being answered. Premorbid pt/family roles/activities: Husband, father, retiree, friend, etc Anticipated changes in roles/activities/participation: resume  Pt/family expectations/goals: Pt states: " I want to get back to my independent level, not have to rely on my wife."  Wife states: " He has difficulty  asking for help, he would rather do it himself and fail."  [employer] Community Agencies: Other (Comment)(in the past) Premorbid Home Care/DME Agencies: Other (Comment)(used a cane prior to admission) Transportation available at discharge: Wife Resource referrals recommended: Neuropsychology, Support group (specify)  Discharge Planning Living Arrangements: Spouse/significant other, Children Support Systems: Spouse/significant other, Children, Friends/neighbors, Other relatives Type of Residence: Private residence Insurance Resources: [employer] Financial Resources: [employer] Financial Screen Referred: No Living Expenses: Database administratorMotgage Money Management: Patient, Spouse Does the patient have any problems obtaining your medications?: Yes (Describe)(no insurance for medications have not signed up for [employer] part D) Home Management: Both mostly wife does this Patient/Family Preliminary Plans: Return home with wife who is not employed and can provide assistance to pt. Pt is hoping he will do better than the team's goals of supervision-min assist level. Will work on discharge needs and make neuro-psych referral for him to be seen while ehre. Sw Barriers to Discharge: Medication compliance Sw Barriers to Discharge Comments: needs to take meds at home Social Work Anticipated Follow Up Needs: HH/OP, Support Group  Clinical Impression Pleasant gentleman who is motivated to do well and will work hard to achieve his goals of mod/i level. His wife is involved and stays here with him to provide support and observe in therapies. She is available to assist at discharge. Will work lon discharge ends and have neuro-psych see while here.  Lucy Chrisupree, Romero Letizia G 12/10/2017, 10:38 AM

## 2017-12-10 NOTE — Progress Notes (Signed)
At 1945 PRN ultram given for C/O HA.At 2200 C/O feeling "hyped up", requesting med for sleep. Paged Dr. Allena KatzPatel, orders for Melatonin, med given at 2235.   At 0020 PRN tylenol given for left shoulder pain. At 0030, C/O CP, "my heart is hurting". Vitals in computer.  Paged Dr. Allena KatzPatel 0040, lidoderm patch ordered and to call back if patient continues to complain of CP. Up to BR with large loose stool, reports feeling much better, decreased CP. Wife reports, patient has C/O of CP for several months, but is too stubborn to tell anyone. Slept from 0130-0600, "best night sleep I've had since coming to hospital."Zachary Deleon, Zachary MuirJamie Deleon

## 2017-12-10 NOTE — Progress Notes (Signed)
Zachary Deleon PHYSICAL MEDICINE & REHABILITATION     PROGRESS NOTE  Subjective/Complaints:   No issues overnite, except CP.  Had a fall off toilet several days ago.  No SOB, no diaphoresis of  ROS: + CP on left , no pain with exertion, no abd pain no N/V/D  Objective: Vital Signs: Blood pressure (!) 166/93, pulse 69, temperature 98 F (36.7 C), resp. rate 18, height 6\' 2"  (1.88 m), weight 132 kg (291 lb 1.6 oz), SpO2 100 %. No results found. Recent Labs    12/07/17 0939 12/10/17 0637  WBC 6.8 7.8  HGB 15.6 15.0  HCT 46.7 46.0  PLT 138* 131*   Recent Labs    12/07/17 0939 12/10/17 0637  NA  --  134*  K  --  3.5  CL  --  101  GLUCOSE  --  159*  BUN  --  12  CREATININE 0.84 0.83  CALCIUM  --  8.6*   CBG (last 3)  Recent Labs    12/09/17 1656 12/09/17 2128 12/10/17 0657  GLUCAP 177* 177* 146*    Wt Readings from Last 3 Encounters:  12/07/17 132 kg (291 lb 1.6 oz)  12/06/17 133.4 kg (294 lb)    Physical Exam:  BP (!) 166/93 (BP Location: Right Arm)   Pulse 69   Temp 98 F (36.7 C)   Resp 18   Ht 6\' 2"  (1.88 m)   Wt 132 kg (291 lb 1.6 oz)   SpO2 100%   BMI 37.38 kg/m  Constitutional: He appearswell-developed.No distress.obese HENT: Normocephalicand atraumatic.  Eyes:EOMare normal. No discharge. Cardiovascular:RRR. No JVD.Ecchymosis on left side ant ax line lower rib areaRespiratory:Effort normal. Clear. BJ:YNWG.NFAOZHYQMVHQGI:Soft.nondistended. Musculoskeletal: Left upper extremity tenderness.pain with L shoulder ROM.  Pain to plap over the left side lower ribs orse at ant axillary line Neurological: He isalertand oriented. Dysarthria Left facial weakness Motor: LUE: 2+/5 proximal to distal (stable) LLE: 3-/5 proximal to distal, trace ankle on left Absent LT sensation LLE, intact LUE RUE/RLE 5/5 proximal to distal. Skin: Skin iswarmand dry.  Psychiatric: He has anormal mood and affect. Hisbehavior is normal.  Assessment/Plan: 1. Functional deficits  secondary to right MCA territory infarction as well as history of brain tumor which require 3+ hours per day of interdisciplinary therapy in a comprehensive inpatient rehab setting. Physiatrist is providing close team supervision and 24 hour management of active medical problems listed below. Physiatrist and rehab team continue to assess barriers to discharge/monitor patient progress toward functional and medical goals.  Function:  Bathing Bathing position   Position: Sitting EOB  Bathing parts Body parts bathed by patient: Left arm, Chest, Abdomen Body parts bathed by helper: Front perineal area, Buttocks, Right upper leg, Right arm, Left upper leg, Right lower leg, Left lower leg, Back  Bathing assist Assist Level: (Max assist)      Upper Body Dressing/Undressing Upper body dressing   What is the patient wearing?: Pull over shirt/dress     Pull over shirt/dress - Perfomed by patient: Thread/unthread right sleeve, Put head through opening, Pull shirt over trunk Pull over shirt/dress - Perfomed by helper: Thread/unthread left sleeve        Upper body assist Assist Level: Touching or steadying assistance(Pt > 75%)   Set up : To obtain clothing/put away  Lower Body Dressing/Undressing Lower body dressing   What is the patient wearing?: Underwear, Pants, Non-skid slipper socks   Underwear - Performed by helper: Thread/unthread right underwear leg, Thread/unthread left underwear leg, Pull  underwear up/down   Pants- Performed by helper: Thread/unthread right pants leg, Thread/unthread left pants leg, Pull pants up/down   Non-skid slipper socks- Performed by helper: Don/doff right sock, Don/doff left sock                  Lower body assist Assist for lower body dressing: (Total assist)      Toileting Toileting     Toileting steps completed by helper: Performs perineal hygiene, Adjust clothing after toileting, Adjust clothing prior to toileting Toileting Assistive Devices:  Grab bar or rail  Toileting assist Assist level: Touching or steadying assistance (Pt.75%)   Transfers Chair/bed transfer   Chair/bed transfer method: Stand pivot Chair/bed transfer assist level: Touching or steadying assistance (Pt > 75%) Chair/bed transfer assistive device: Armrests, Bedrails     Locomotion Ambulation     Max distance: 60 ft Assist level: 2 helpers(pt mod assist, +2 for w/c follow)   Wheelchair   Type: Manual Max wheelchair distance: 133ft    Cognition Comprehension Comprehension assist level: Follows basic conversation/direction with extra time/assistive device  Expression Expression assist level: Expresses basic needs/ideas: With extra time/assistive device  Social Interaction Social Interaction assist level: Interacts appropriately with others with medication or extra time (anti-anxiety, antidepressant).  Problem Solving Problem solving assist level: Solves basic 90% of the time/requires cueing < 10% of the time  Memory Memory assist level: Recognizes or recalls 75 - 89% of the time/requires cueing 10 - 24% of the time    Medical Problem List and Plan: 1.Left side weakness with dysarthriasecondary to right MCA territory infarction as well as history of brain tumor many years ago with resection at [facility] CIR, PT, OT, SLP 2. DVT Prophylaxis/Anticoagulation: Subcutaneous Lovenox. Monitor for any bleeding episodes 3. Pain Management:Neurontin 100 mg 3 times daily, Ultram as needed 4. Mood:Prozac 20 mg daily, Seroquel 25 mg twice daily as needed 5. Neuropsych: This patientiscapable of making decisions on hisown behalf. 6. Skin/Wound Care:Routine skin checks 7. Fluids/Electrolytes/Nutrition:Routine in and outs 8.Asymptomatic carotid stenosis. Patient followed vascular surgery [facility] Dr.Velasquez Derrell Lolling. 9.Hypertension. Monitor with increased mobility. Patient on Norvasc 10 mg daily, Zestoretic 20-12.5 mg  daily prior to admission. Resume as needed  Elevated, but ?improving 10.Diabetes mellitus. Hemoglobin A1c 11.4.  -NovoLog 10 units 3 times daily, Lantus insulin 45 units daily. -Check blood sugars before meals and at bedtime. Diabetic teaching   CBG (last 3)  Recent Labs    12/09/17 1656 12/09/17 2128 12/10/17 0657  GLUCAP 177* 177* 146*  good in hospital control 7/1 11.Tobacco abuse. NicoDerm patch. Counseling 12.Hyperlipidemia. Lipitor/fenofibrate 13.Medical noncompliance. Counseling 14.Morbid Obesity. BMI 37.38. Dietary follow-up 15. Hyponatremia  Sodium 126 on 6/25, improved to 134 on 7/1  Labs pending  Continue to monitor 16. Hypomagnesemia  Magnesium 1.5 on 6/25  Labs pending 17. Thrombocytopenia  Platelets 138 on 6/28, stable at 131K on 7/1   Continue to monitor  LOS (Days) 3 A FACE TO FACE EVALUATION WAS PERFORMED  Erick Colace 12/10/2017 8:31 AM

## 2017-12-10 NOTE — Progress Notes (Signed)
Speech Language Pathology Daily Session Note  Patient Details  Name: Zachary Deleon MRN: [id number] Date of Birth: Nov 20, 1965  Today's Date: 12/10/2017 SLP Individual Time: [id number] SLP Individual Time Calculation (min): 45 min  Short Term Goals: Week 1: SLP Short Term Goal 1 (Week 1): Pt will utilize speech intelligibility strategies to achieve 90%-100% intelligibility at the sentence level given supervision cues. SLP Short Term Goal 2 (Week 1): Pt will complete basic + functional problem-solving tasks including time management, basic calculations, and medication management given supervision cues.  SLP Short Term Goal 3 (Week 1): Pt will complete functional visuospatial/executive functioning tasks with 90% accuracy given supervision cues.  SLP Short Term Goal 4 (Week 1): Pt will selectively attend to task in a mildly distracting environment for up to 30 minutes with Mod I.  Skilled Therapeutic Interventions: Skilled treatment session focused on cognition goals. SLP facilitated session by providing supervision cues to demonstrate selective attention to task in mildly distracting environment for ~ 40 minutes. Pt able to read and comprehend compensatory speech intelligibility strategies. Strategies posted in room. Additionally, SLP facilitated session by providing supervision question cues for anticipatory awareness. Pt was left upright in bed, bed alarm on and wife present. Continue per current plan of care.      Function:    Cognition Comprehension Comprehension assist level: Follows basic conversation/direction with extra time/assistive device  Expression   Expression assist level: Expresses basic 75 - 89% of the time/requires cueing 10 - 24% of the time. Needs helper to occlude trach/needs to repeat words.  Social Interaction Social Interaction assist level: Interacts appropriately with others with medication or extra time (anti-anxiety, antidepressant).  Problem Solving Problem solving  assist level: Solves basic 90% of the time/requires cueing < 10% of the time  Memory Memory assist level: Recognizes or recalls 75 - 89% of the time/requires cueing 10 - 24% of the time    Pain Pain Assessment Pain Score: 2   Therapy/Group: Individual Therapy  Cameren Earnest 12/10/2017, 10:41 AM

## 2017-12-10 NOTE — Progress Notes (Addendum)
Physical Therapy Session Note  Patient Details  Name: Zachary Deleon MRN: [id number] Date of Birth: 1965-10-13  Today's Date: 12/10/2017 PT Individual Time: 1310-1424 PT Individual Time Calculation (min): 74 min   Short Term Goals: Week 1:  PT Short Term Goal 1 (Week 1): pt will perform bed<>WC transfers with min assist consistently  PT Short Term Goal 2 (Week 1): Pt will propell WC 12700ft with supervision assist  PT Short Term Goal 3 (Week 1): Pt will ambulate 7730ft with mod assist and LRAD  PT Short Term Goal 4 (Week 1): Pt will perform bed mobiltiy with min assist with sit<>supine and minimal use of bed rails.   Skilled Therapeutic Interventions/Progress Updates:  Pt received in bathroom on toilet with wife Luster Landsberg(Renee) present for session. Pt with c/o B knee pain and HA & RN aware & administered meds during session. Therapist performed peri hygiene and clothing management dependent assist and pt requiring steady assist for standing balance while using grab bar. Pt transfers to w/c with min assist via stand pivot and performs hand hygiene at sink from w/c level. Pt's wife assists him with donning shirt and pt propels w/c room>gym with R hemi technique, rest breaks PRN, extra time and supervision with occasional cuing for obstacle avoidance on L as pt veers to L. Gait training with RW with therapist providing double wheeled RW and L hand orthosis as pt had difficulty maintaining grasp on RW. During gait pt with significant L knee hyperextension during stance phase but unable to recognize this 2/2 impaired proprioception and sensation. Trialed heel wedge to help minimize recurvatum with little success. Educated pt & wife on use of GRAFO to help minimize recurvatum and improvement noted, with plan to obtain pt his personal brace this week. As gait progresses pt begins to demonstrate progressing R lateral lean with decreased awareness of this and more frequent rest breaks required 2/2 fatigue. Educated pt on  need for tennis shoes for use of AFO while performing any standing/walking. At end of session pt transferred w/c>recliner via stand pivot with steady assist. Pt left in recliner with chair alarm donned, wife present, & call bel in reach.  Therapy Documentation Precautions:  Precautions Precautions: Fall Restrictions Weight Bearing Restrictions: No    See Function Navigator for Current Functional Status.   Therapy/Group: Individual Therapy  Sandi MariscalVictoria M Miller 12/10/2017, 4:14 PM

## 2017-12-10 NOTE — Progress Notes (Signed)
Orthopedic Tech Progress Note Patient Details:  [name] 05/03/1966 [id number]  Patient ID: Zachary Deleon, male   DOB: 05/03/1966, 52 y.o.   MRN: [id number]   [name], [name] 12/10/2017, 3:29 PM Called in [employer] brace order; spoke with Marcelino DusterMichelle

## 2017-12-10 NOTE — Progress Notes (Signed)
Pt has refused cpap at this time. RT will monitor. 

## 2017-12-10 NOTE — Progress Notes (Signed)
Occupational Therapy Session Note  Patient Details  Name: Zachary Deleon MRN: [id number] Date of Birth: 1965-08-06  Today's Date: 12/10/2017 OT Individual Time: [id number] OT Individual Time Calculation (min): 60 min    Short Term Goals: Week 1:  OT Short Term Goal 1 (Week 1): Pt will complete bathing with min assist at sit > stand level OT Short Term Goal 2 (Week 1): Pt will complete 2/3 toileting tasks with min assist OT Short Term Goal 3 (Week 1): Pt will complete LB dressing with mod assist at sit > stand level OT Short Term Goal 4 (Week 1): Pt will utilize LUE as gross assist when opening containers to complete self-care tasks  Skilled Therapeutic Interventions/Progress Updates:    Treatment session with focus on self-care retraining and LUE NMR.  Pt received sound asleep upon arrival, pt arousing to tactile stimulus and calling name.  Pt required increased time to fully arouse, reporting "unable to awaken" today.  Pt donned shirt with setup and increased time, donned socks in modified circle sitting at EOB and required assistance to don and fasten shoes.  Stand pivot transfer bed > w/c > toilet min assist.  Pt urinated in standing with min assist for standing balance.  Engaged in LUE NMR in sitting with focus on motor control and movements outside flexor synergy.  Pt dropping items ~30% of time and requiring cues to slow down to increase control of movements.  Pt engaged in stacking blocks followed by placing pegs in resistive peg board.  Pt with improvement with control and graded movements with pegs.  Returned to room and left seated upright in recliner with chair alarm on and nurse staff present.  Therapy Documentation Precautions:  Precautions Precautions: Fall Restrictions Weight Bearing Restrictions: No Pain:  Pt with c/o pain in Lt shoulder.  Repositioned.   See Function Navigator for Current Functional Status.   Therapy/Group: Individual Therapy  Rosalio LoudHOXIE, Kalijah Zeiss 12/10/2017,  12:16 PM

## 2017-12-11 ENCOUNTER — Inpatient Hospital Stay (HOSPITAL_COMMUNITY): Payer: Medicare Other | Admitting: Occupational Therapy

## 2017-12-11 ENCOUNTER — Inpatient Hospital Stay (HOSPITAL_COMMUNITY): Payer: Medicare Other | Admitting: Physical Therapy

## 2017-12-11 ENCOUNTER — Inpatient Hospital Stay (HOSPITAL_COMMUNITY): Payer: Medicare Other

## 2017-12-11 ENCOUNTER — Encounter (HOSPITAL_COMMUNITY): Payer: Self-pay | Admitting: *Deleted

## 2017-12-11 ENCOUNTER — Encounter (HOSPITAL_COMMUNITY): Payer: Medicare Other | Admitting: Psychology

## 2017-12-11 LAB — GLUCOSE, CAPILLARY
GLUCOSE-CAPILLARY: 116 mg/dL — AB (ref 70–99)
Glucose-Capillary: 130 mg/dL — ABNORMAL HIGH (ref 70–99)
Glucose-Capillary: 150 mg/dL — ABNORMAL HIGH (ref 70–99)
Glucose-Capillary: 156 mg/dL — ABNORMAL HIGH (ref 70–99)

## 2017-12-11 MED ORDER — LOPERAMIDE HCL 2 MG PO CAPS: 2.0000 mg | ORAL_CAPSULE | ORAL | Status: DC | PRN

## 2017-12-11 MED ORDER — DICLOFENAC SODIUM 1 % TD GEL
2.0000 g | Freq: Four times a day (QID) | TRANSDERMAL | Status: DC
  Administered 2017-12-11 – 2017-12-21 (×33): 2 g via TOPICAL
  Filled 2017-12-11 (×3): qty 100

## 2017-12-11 NOTE — Progress Notes (Signed)
[place] PHYSICAL MEDICINE & REHABILITATION     PROGRESS NOTE  Subjective/Complaints:   + liquid stool no cramping , no nausea  ROS: + CP on left , no pain with exertion, no abd pain no N/V/D  Objective: Vital Signs: Blood pressure (!) 145/74, pulse (!) 57, temperature (!) 97.5 F (36.4 C), temperature source Oral, resp. rate 18, height 6\' 2"  (1.88 m), weight 132 kg (291 lb 1.6 oz), SpO2 98 %. No results found. Recent Labs    12/10/17 0637  WBC 7.8  HGB 15.0  HCT 46.0  PLT 131*   Recent Labs    12/10/17 0637  NA 134*  K 3.5  CL 101  GLUCOSE 159*  BUN 12  CREATININE 0.83  CALCIUM 8.6*   CBG (last 3)  Recent Labs    12/10/17 1643 12/10/17 2212 12/11/17 0631  GLUCAP 122* 167* 130*    Wt Readings from Last 3 Encounters:  12/07/17 132 kg (291 lb 1.6 oz)  12/06/17 133.4 kg (294 lb)    Physical Exam:  BP (!) 145/74 (BP Location: Right Arm)   Pulse (!) 57   Temp (!) 97.5 F (36.4 C) (Oral)   Resp 18   Ht 6\' 2"  (1.88 m)   Wt 132 kg (291 lb 1.6 oz)   SpO2 98%   BMI 37.38 kg/m  Constitutional: He appearswell-developed.No distress.obese HENT: Normocephalicand atraumatic.  Eyes:EOMare normal. No discharge. Cardiovascular:RRR. No JVD.Ecchymosis on left side ant ax line lower rib areaRespiratory:Effort normal. Clear. BJ:YNWG.NFAOZHYQMVHQGI:Soft.nondistended. Musculoskeletal: Left upper extremity tenderness.pain with L shoulder ROM.  Pain to plap over the left side lower ribs orse at ant axillary line Neurological: He isalertand oriented. Dysarthria Left facial weakness Motor: LUE: 3-/5 proximal to distal (improved) LLE: 3-/5 proximal to distal, trace ankle on left Absent LT sensation LLE, intact LUE RUE/RLE 5/5 proximal to distal. Skin: Skin iswarmand dry.  Psychiatric: He has anormal mood and affect. Hisbehavior is normal.  Assessment/Plan: 1. 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 as well as history of brain tumor which require 3+  hours per day of interdisciplinary therapy in a comprehensive inpatient rehab setting. Physiatrist is providing close team supervision and 24 hour management of active medical problems listed below. Physiatrist and rehab team continue to assess barriers to discharge/monitor patient progress toward functional and medical goals.  Function:  Bathing Bathing position   Position: Sitting EOB  Bathing parts Body parts bathed by patient: Left arm, Chest, Abdomen Body parts bathed by helper: Front perineal area, Buttocks, Right upper leg, Right arm, Left upper leg, Right lower leg, Left lower leg, Back  Bathing assist Assist Level: (Max assist)      Upper Body Dressing/Undressing Upper body dressing   What is the patient wearing?: Pull over shirt/dress     Pull over shirt/dress - Perfomed by patient: Thread/unthread right sleeve, Put head through opening, Pull shirt over trunk, Thread/unthread left sleeve Pull over shirt/dress - Perfomed by helper: Thread/unthread left sleeve        Upper body assist Assist Level: Supervision or verbal cues, Set up   Set up : To obtain clothing/put away  Lower Body Dressing/Undressing Lower body dressing   What is the patient wearing?: Pants, Socks, Shoes   Underwear - Performed by helper: Thread/unthread right underwear leg, Thread/unthread left underwear leg, Pull underwear up/down   Pants- Performed by helper: Thread/unthread right pants leg, Thread/unthread left pants leg, Pull pants up/down   Non-skid slipper socks- Performed by helper: Don/doff right sock,  Don/doff left sock Socks - Performed by patient: Don/doff right sock, Don/doff left sock   Shoes - Performed by patient: Don/doff right shoe Shoes - Performed by helper: Don/doff left shoe, Fasten right, Fasten left          Lower body assist Assist for lower body dressing: (Max assist)      Toileting Toileting     Toileting steps completed by helper: Performs perineal hygiene,  Adjust clothing after toileting, Adjust clothing prior to toileting Toileting Assistive Devices: Grab bar or rail  Toileting assist Assist level: Touching or steadying assistance (Pt.75%)   Transfers Chair/bed transfer   Chair/bed transfer method: Stand pivot Chair/bed transfer assist level: Touching or steadying assistance (Pt > 75%) Chair/bed transfer assistive device: Armrests, Bedrails     Locomotion Ambulation     Max distance: 50 ft  Assist level: Touching or steadying assistance (Pt > 75%)   Wheelchair   Type: Manual Max wheelchair distance: 150 ft  Assist Level: Supervision or verbal cues  Cognition Comprehension Comprehension assist level: Follows basic conversation/direction with extra time/assistive device  Expression Expression assist level: Expresses basic 75 - 89% of the time/requires cueing 10 - 24% of the time. Needs helper to occlude trach/needs to repeat words.  Social Interaction Social Interaction assist level: Interacts appropriately with others with medication or extra time (anti-anxiety, antidepressant).  Problem Solving Problem solving assist level: Solves basic 90% of the time/requires cueing < 10% of the time  Memory Memory assist level: Recognizes or recalls 75 - 89% of the time/requires cueing 10 - 24% of the time    Medical Problem List and Plan: 1.Left side weakness with dysarthriasecondary to right MCA territory infarction as well as history of brain tumor many years ago with resection at [facility] CIR, PT, OT, SLP team conf in am 2. DVT Prophylaxis/Anticoagulation: Subcutaneous Lovenox. Monitor for any bleeding episodes 3. Pain Management:Neurontin 100 mg 3 times daily, Ultram as needed 4. Mood:Prozac 20 mg daily, Seroquel 25 mg twice daily as needed 5. Neuropsych: This patientiscapable of making decisions on hisown behalf. 6. Skin/Wound Care:Routine skin checks 7. Fluids/Electrolytes/Nutrition:Routine in and  outs 8.Asymptomatic carotid stenosis. Patient followed vascular surgery [facility] Dr.Velasquez Derrell Lolling. 9.Hypertension. Monitor with increased mobility. Patient on Norvasc 10 mg daily, Zestoretic 20-12.5 mg daily prior to admission. Resume as needed  Elevated, but ?improving 10.Diabetes mellitus. Hemoglobin A1c 11.4.  -NovoLog 10 units 3 times daily, Lantus insulin 45 units daily. -Check blood sugars before meals and at bedtime. Diabetic teaching   CBG (last 3)  Recent Labs    12/10/17 1643 12/10/17 2212 12/11/17 0631  GLUCAP 122* 167* 130*  good in hospital control 7/2 11.Tobacco abuse. NicoDerm patch. Counseling 12.Hyperlipidemia. Lipitor/fenofibrate 13.Medical noncompliance. Counseling 14.Morbid Obesity. BMI 37.38. Dietary follow-up 15. Hyponatremia  Sodium 126 on 6/25, improved to 134 on 7/1  Labs pending  Continue to monitor 16. Hypomagnesemia  Magnesium 1.5 on 6/25, repeat in am  17. Thrombocytopenia  Platelets 138 on 6/28, stable at 131K on 7/1   Continue to monitor 18.  Loose stool  LOS (Days) 4 A FACE TO FACE EVALUATION WAS PERFORMED  Erick Colace 12/11/2017 8:08 AM

## 2017-12-11 NOTE — Plan of Care (Signed)
  Problem: RH SAFETY Goal: RH STG ADHERE TO SAFETY PRECAUTIONS W/ASSISTANCE/DEVICE Description STG Adhere to Safety Precautions With supervision Assistance/Device.  Outcome: Progressing  Call light at hand, bed alarm, proper footwear

## 2017-12-11 NOTE — Progress Notes (Addendum)
Physical Therapy Session Note  Patient Details  Name: Zachary Deleon MRN: [id number] Date of Birth: [birth year]  Today's Date: 12/11/2017 PT Individual Time: 1034-1200 PT Individual Time Calculation (min): 86 min   Short Term Goals: Week 1:  PT Short Term Goal 1 (Week 1): pt will perform bed<>WC transfers with min assist consistently  PT Short Term Goal 2 (Week 1): Pt will propell WC 16900ft with supervision assist  PT Short Term Goal 3 (Week 1): Pt will ambulate 5030ft with mod assist and LRAD  PT Short Term Goal 4 (Week 1): Pt will perform bed mobiltiy with min assist with sit<>supine and minimal use of bed rails.   Skilled Therapeutic Interventions/Progress Updates:  Pt received asleep in bed but easily awakened & agreeable to tx. Pt with c/o HA & B knee pain & RN made aware. Pt transferred sidelying>sitting EOB with hospital bed features and extra time. Pt completes stand pivot transfers with min assist overall throughout session. Pt propels w/c room>gym with R hemi technique, supervision, frequent rest breaks 2/2 RLE fatigue and extra time overall. Supine on mat table pt performed BLE bridging with adductor hold and cuing for eccentric control. Pt progressed to LLE single leg bridging for strengthening & NMR with therapist providing assistance to stabilize LLE. Pt performed sit>stand transfers progressing to RLE elevated on step with BUE on L knee and education for increased anterior weight shifting with activity focusing on increasing weight bearing through LLE for strengthening & NMR. Pt began to engage in peg board task with LUE for forced use and NMR when Zachary Deleon([employer] rep) arrived. Gait x 10 ft with RW & L hand orthosis with min assist with pt demonstrating decreased gait speed on this date as well as less L knee hyperextesion. Pt requires cuing to ambulate within base of RW for more upright posture. Zachary OhmChris recommends trying 2 different AFO's to help prevent knee hyperextension and will bring them  tomorrow. At end of session pt left sitting on EOB with alarm set & RN in room, call bell in reach.  Addendum: Pt requires frequent rest breaks 2/2 poor endurance & fatigue.  Therapy Documentation Precautions:  Precautions Precautions: Fall Restrictions Weight Bearing Restrictions: No   See Function Navigator for Current Functional Status.   Therapy/Group: Individual Therapy  Sandi MariscalVictoria M Lyn Joens 12/11/2017, 12:34 PM

## 2017-12-11 NOTE — Progress Notes (Signed)
Patient refused CPAP at this time.  RT will continue to monitor. 

## 2017-12-11 NOTE — Progress Notes (Signed)
Speech Language Pathology Daily Session Note  Patient Details  Name: Lucianne Leilton J Salah MRN: [id number] Date of Birth: 1965/09/11  Today's Date: 12/11/2017 SLP Individual Time: 1415-1445 SLP Individual Time Calculation (min): 30 min  Short Term Goals: Week 1: SLP Short Term Goal 1 (Week 1): Pt will utilize speech intelligibility strategies to achieve 90%-100% intelligibility at the sentence level given supervision cues. SLP Short Term Goal 2 (Week 1): Pt will complete basic + functional problem-solving tasks including time management, basic calculations, and medication management given supervision cues.  SLP Short Term Goal 3 (Week 1): Pt will complete functional visuospatial/executive functioning tasks with 90% accuracy given supervision cues.  SLP Short Term Goal 4 (Week 1): Pt will selectively attend to task in a mildly distracting environment for up to 30 minutes with Mod I. SLP Short Term Goal 5 (Week 1): Pt will self-monitor and correct functional errors with supervision A verbal cues.   Skilled Therapeutic Interventions:Skilled ST services focused on speech and cognitive skills. SLP facilitated recall of speech intelligibility strategies, posted in room, pt required supervision cues and utilized strategies with min A verbal cues for intelligibility at sentence level. SLP facilitated problem solving utilzing A:FA money management task pt required min A fading to supervision A verbal cues and AFA daily math problems pt required mod-min A verbal cues. Pt required verbal cues to reduce impulsivity during problem solving task and assistance noting/correcting errors.Pt was left in room with call bell within reach and bed alaram set. ST reccomends to continuedd skilled ST services. SLP add emergent awareness goal.      Function:  Eating Eating                 Cognition Comprehension    Expression      Social Interaction    Problem Solving    Memory      Pain Pain Assessment Pain  Scale: 0-10 Pain Score: 6  Pain Type: Acute pain Pain Location: Head Pain Descriptors / Indicators: Aching;Headache Pain Frequency: Intermittent Pain Onset: Progressive Patients Stated Pain Goal: 2 Pain Intervention(s): Medication (See eMAR)  Therapy/Group: Individual Therapy  Endi Lagman  [facility] 12/11/2017, 2:53 PM

## 2017-12-11 NOTE — Progress Notes (Signed)
Occupational Therapy Session Note  Patient Details  Name: Zachary Deleon MRN: [id number] Date of Birth: 1965-10-01  Today's Date: 12/11/2017 OT Individual Time: 4034-74250730-0830 and 1520-1600 OT Individual Time Calculation (min): 60 min and 40 min   Short Term Goals: Week 1:  OT Short Term Goal 1 (Week 1): Pt will complete bathing with min assist at sit > stand level OT Short Term Goal 2 (Week 1): Pt will complete 2/3 toileting tasks with min assist OT Short Term Goal 3 (Week 1): Pt will complete LB dressing with mod assist at sit > stand level OT Short Term Goal 4 (Week 1): Pt will utilize LUE as gross assist when opening containers to complete self-care tasks  Skilled Therapeutic Interventions/Progress Updates:    1) Treatment session with focus on ADL retraining with dynamic standing balance and functional use of LUE during bathing and dressing tasks.  Pt received upright willing to engage in bathing at shower level.  Stand pivot transfer w/c > tub bench in room shower with heavy reliance on grab bars during transfer.  Bathing completed with pt washing lower legs in figure 4 position and washing buttocks in standing.  Therapist provided steadying assist at LLE when crossed in figure 4 to allow pt to wash foot.  Therapist also provided manual facilitation after tactile cues to promote midline standing as pt with tendency to weight shift to Rt during bathing.  Pt utilized LUE as gross assist with washing body and hair, dropping cloth x2 during bathing.  Educated on hemi-technique with LB dressing, pt still requiring assistance when threading Lt pant leg and to pull pants over Lt hip when in standing.  Engaged in LUE NMR with focus on motor control with picking up, rotating, and stacking cups.  Increased challenge to reaching outside flexor synergy with pt demonstrating increased ROM.  Pt groans occasionally during movement, claims difficulty due to weakness and occasional pain in Lt shoulder with movement.   Pt left seated EOB with wife present.  2) Treatment session with focus on LUE NMR.  Pt received seated EOB.  Completed stand pivot transfer to Rt with min assist and decreased reliance on UE support.  Engaged in LUE NMR in sitting with focus on flipping over cards and reaching in all planes to match by suit.  Pt demonstrating increased difficulty when reaching across midline.  Engaged in fine motor control with standing up small animal figurines to challenge motor control and precision.  Pt required increased time and effort to complete task.  Returned figurines to container by reaching across midline to further address motor control and shoulder ROM.  Returned to room and left seated EOB with bed alarm on.  Therapy Documentation Precautions:  Precautions Precautions: Fall Restrictions Weight Bearing Restrictions: No Pain: Pt with c/o headache, RN aware.  Premedicated  See Function Navigator for Current Functional Status.   Therapy/Group: Individual Therapy  Rosalio LoudHOXIE, [place] Propst 12/11/2017, 10:41 AM

## 2017-12-12 ENCOUNTER — Inpatient Hospital Stay (HOSPITAL_COMMUNITY): Payer: Medicare Other | Admitting: Physical Therapy

## 2017-12-12 ENCOUNTER — Inpatient Hospital Stay (HOSPITAL_COMMUNITY): Payer: Medicare Other | Admitting: Occupational Therapy

## 2017-12-12 ENCOUNTER — Inpatient Hospital Stay (HOSPITAL_COMMUNITY): Payer: Medicare Other

## 2017-12-12 ENCOUNTER — Inpatient Hospital Stay (HOSPITAL_COMMUNITY): Payer: Medicare Other | Admitting: Speech Pathology

## 2017-12-12 LAB — MAGNESIUM: MAGNESIUM: 1.7 mg/dL (ref 1.7–2.4)

## 2017-12-12 LAB — GLUCOSE, CAPILLARY
GLUCOSE-CAPILLARY: 113 mg/dL — AB (ref 70–99)
GLUCOSE-CAPILLARY: 127 mg/dL — AB (ref 70–99)
Glucose-Capillary: 88 mg/dL (ref 70–99)
Glucose-Capillary: 125 mg/dL — ABNORMAL HIGH (ref 70–99)

## 2017-12-12 LAB — BASIC METABOLIC PANEL
ANION GAP: 6 (ref 5–15)
BUN: 8 mg/dL (ref 6–20)
CO2: 26 mmol/L (ref 22–32)
Calcium: 8.7 mg/dL — ABNORMAL LOW (ref 8.9–10.3)
Chloride: 105 mmol/L (ref 98–111)
Creatinine, Ser: 0.89 mg/dL (ref 0.61–1.24)
GFR calc Af Amer: >60 mL/min (ref >60)
Glucose, Bld: 118 mg/dL — ABNORMAL HIGH (ref 70–99)
Potassium: 3.8 mmol/L (ref 3.5–5.1)
SODIUM: 137 mmol/L (ref 135–145)

## 2017-12-12 MED ORDER — MUSCLE RUB 10-15 % EX CREA
TOPICAL_CREAM | Freq: Two times a day (BID) | CUTANEOUS | Status: DC | PRN
  Filled 2017-12-12: qty 85

## 2017-12-12 MED ORDER — TROLAMINE SALICYLATE 10 % EX CREA
TOPICAL_CREAM | Freq: Two times a day (BID) | CUTANEOUS | Status: DC | PRN
  Filled 2017-12-12: qty 85

## 2017-12-12 MED ORDER — METHOCARBAMOL 500 MG PO TABS
500.0000 mg | ORAL_TABLET | Freq: Three times a day (TID) | ORAL | Status: DC | PRN
  Administered 2017-12-12 – 2017-12-18 (×5): 500 mg via ORAL
  Filled 2017-12-12 (×7): qty 1

## 2017-12-12 NOTE — Consult Note (Signed)
Neuropsychological Consultation   Patient:   Zachary Deleon   DOB:   March 06, 1966  MR Number:  [id number]  [facility]  [facility] [address] [id number] [place] [id number] Dept: [phone] Loc: [phone]           Date of Service:   12/12/2017  Start Time:   1 PM End Time:   2 PM  Provider/Observer:  Arley PhenixJohn Rodenbough, Psy.D.       Clinical Neuropsychologist       Billing Code/Service: [phone] 4 Units  Chief Complaint:    Zachary Deleon is a 52 year old male with history of COPD, carotid stenosis, diabetes, DVT, hypertension, brain tumor with excision 20 year ago and medical noncompliance with not taking his medicines for the past 6 months.  [facility] with left sided weakness and slurred speech.  MRI/MRA showed patchy areas of subacute infarction in the right posterior MCA territory.  Moderate stenosis of right MCA into M2 branches.    Reason for Service:  UVO:[id number]:Zachary Deleon is a 52 year old right-handed male with history of COPD/tobacco abuse, carotid stenosis [facility] Sh[facility] Sites[facility]as[facility],diabetes mellitus, DVT maintained on Eliquisin the past, hypertension, brain tumor with excision 20 years ago and medical noncompliance as he has not taken his medicines for the past 6 months. Patient lives with his wife. Used a straight point cane prior to admission. Wife can assist as needed. One level home with 3 steps to entry. [facility]2017 with left-sided weakness and slurred speech.Urine drug screen negative.Cranial CT scan showed postsurgical changes seen in the right cerebellar hemisphere. No acute intracranial process. Mild chronic ischemic white matter disease. Patient did not receive TPA. MRI/MRA showed patchy areas of subacute infarction in the right posterior MCA territory. Occlusion of the right  internal carotid artery through the cavernous segment. Moderate stenosis right MCA bifurcation extending into the M2 branches. Carotid Doppler showed right internal carotid artery occlusion. Greater than 70% stenosis in the left ICA. Echocardiogram with ejection fraction of 60% no wall motion abnormalities. EEG negative seizure. Neurology services consulted maintained on aspirin and Plavix for CVA prophylaxis. Subcutaneous Lovenox for DVT prophylaxis. Tolerating a regular consistency diet. Physical and occupational therapy evaluations completed. Patient was admitted for a comprehensive rehab program.  Current Status:  Zachary Deleon reports no significant mood disturbance or adjustment/coping issues.  Motivated to fully participate in rehab program.    Behavioral Observation: Zachary Deleon  presents as a 52 y.o.-year-old Right Caucasian Male who appeared his stated age. his dress was Appropriate and he was Well Groomed and his manners were Appropriate to the situation.  his participation was indicative of Appropriate and Attentive behaviors.  There were any physical disabilities noted.  he displayed an appropriate level of cooperation and motivation.     Interactions:    Active Appropriate and Attentive  Attention:   within normal limits and attention span and concentration were age appropriate  Memory:   within normal limits; recent and remote memory intact  Visuo-spatial:  within normal limits  Speech (Volume):  normal  Speech:   slurred; slurred  Thought Process:  Coherent and Relevant  Though Content:  WNL; not suicidal and not homicidal  Orientation:   person, place, time/date and situation  Judgment:   Fair  Planning:   Fair  Affect:    Appropriate  Mood:  Euthymic  Insight:   Good  Intelligence:   normal  Medical History:   Past Medical History:  Diagnosis Date  . Arthritis   . COPD (chronic obstructive pulmonary disease) (HCC)   . Diabetes mellitus  without complication (HCC)   . DVT (deep venous thrombosis) (HCC)   . Hypertension   . Seizures (HCC)   . Stroke [facility]) 11/2017    Impression/DX:  Zachary Deleon is a 52 year old male with history of COPD, carotid stenosis, diabetes, DVT, hypertension, brain tumor with excision 20 year ago and medical noncompliance with not taking his medicines for the past 6 months.  Presented to [facility] on 12/02/2017 with left sided weakness and slurred speech.  MRI/MRA showed patchy areas of subacute infarction in the right posterior MCA territory.  Moderate stenosis of right MCA into M2 branches.    [facility] reports no significant mood disturbance or adjustment/coping issues.  Motivated to fully participate in rehab program.  Disposition/Plan:  Worked on coping issues around recent stroke and issues on need for medical compliance in future.         Electronically Signed   _______________________ Arley Phenix, Psy.D.

## 2017-12-12 NOTE — Progress Notes (Addendum)
[place] PHYSICAL MEDICINE & REHABILITATION     PROGRESS NOTE  Subjective/Complaints:   Back pain with spasms last noc   ROS: - CP on left , no pain with exertion, no abd pain no N/V/D  Objective: Vital Signs: Blood pressure 136/76, pulse (!) 58, temperature (!) 97.3 F (36.3 C), temperature source Oral, resp. rate 17, height '6\' 2"'$  (1.88 m), weight 132 kg (291 lb 1.6 oz), SpO2 97 %. No results found. Recent Labs    12/10/17 0637  WBC 7.8  HGB 15.0  HCT 46.0  PLT 131*   Recent Labs    12/10/17 0637 12/12/17 0539  NA 134* 137  K 3.5 3.8  CL 101 105  GLUCOSE 159* 118*  BUN 12 8  CREATININE 0.83 0.89  CALCIUM 8.6* 8.7*   CBG (last 3)  Recent Labs    12/11/17 1646 12/11/17 2132 12/12/17 0620  GLUCAP 150* 156* 113*    Wt Readings from Last 3 Encounters:  12/07/17 132 kg (291 lb 1.6 oz)  12/06/17 133.4 kg (294 lb)    Physical Exam:  BP 136/76 (BP Location: Left Arm)   Pulse (!) 58   Temp (!) 97.3 F (36.3 C) (Oral)   Resp 17   Ht '6\' 2"'$  (1.88 m)   Wt 132 kg (291 lb 1.6 oz)   SpO2 97%   BMI 37.38 kg/m  Constitutional: He appearswell-developed.No distress.obese HENT: Normocephalicand atraumatic.  Eyes:EOMare normal. No discharge. Cardiovascular:RRR. No JVD.Ecchymosis on left side ant ax line lower rib areaRespiratory:Effort normal. Clear. TM:LYYT.[id number]. Musculoskeletal: Left upper extremity tenderness.pain with L shoulder ROM.  Pain to plap over the left side lower ribs orse at ant axillary line, low back paraspinal tenderness    Neurological: He isalertand oriented. Dysarthria Left facial weakness Motor: LUE: 3-/5 proximal to distal (improved) LLE: 3-/5 proximal to distal, trace ankle on left Absent LT sensation LLE, intact LUE RUE/RLE 5/5 proximal to distal. Skin: Skin iswarmand dry.  Psychiatric: He has anormal mood and affect. Hisbehavior is normal.  Assessment/Plan: 1. Functional deficits secondary to right MCA  territory infarction as well as history of brain tumor which require 3+ hours per day of interdisciplinary therapy in a comprehensive inpatient rehab setting. Physiatrist is providing close team supervision and 24 hour management of active medical problems listed below. Physiatrist and rehab team continue to assess barriers to discharge/monitor patient progress toward functional and medical goals.  Function:  Bathing Bathing position   Position: Shower  Bathing parts Body parts bathed by patient: Right arm, Left arm, Abdomen, Chest, Front perineal area, Buttocks, Right upper leg, Left upper leg, Right lower leg, Left lower leg Body parts bathed by helper: Back  Bathing assist Assist Level: Touching or steadying assistance(Pt > 75%)      Upper Body Dressing/Undressing Upper body dressing   What is the patient wearing?: Pull over shirt/dress     Pull over shirt/dress - Perfomed by patient: Thread/unthread right sleeve, Put head through opening, Pull shirt over trunk, Thread/unthread left sleeve Pull over shirt/dress - Perfomed by helper: Thread/unthread left sleeve        Upper body assist Assist Level: Supervision or verbal cues, Set up   Set up : To obtain clothing/put away  Lower Body Dressing/Undressing Lower body dressing   What is the patient wearing?: Underwear, Pants, Socks Underwear - Performed by patient: Thread/unthread right underwear leg, Thread/unthread left underwear leg Underwear - Performed by helper: Pull underwear up/down Pants- Performed by patient: Thread/unthread right pants leg Pants-  Performed by helper: Thread/unthread left pants leg, Pull pants up/down   Non-skid slipper socks- Performed by helper: Don/doff right sock, Don/doff left sock Socks - Performed by patient: Don/doff right sock, Don/doff left sock   Shoes - Performed by patient: Don/doff right shoe Shoes - Performed by helper: Don/doff left shoe, Fasten right, Fasten left          Lower  body assist Assist for lower body dressing: Touching or steadying assistance (Pt > 75%)(Mod assist)      Toileting Toileting     Toileting steps completed by helper: Performs perineal hygiene, Adjust clothing after toileting, Adjust clothing prior to toileting Toileting Assistive Devices: Grab bar or rail  Toileting assist Assist level: Touching or steadying assistance (Pt.75%)   Transfers Chair/bed transfer   Chair/bed transfer method: Stand pivot Chair/bed transfer assist level: Touching or steadying assistance (Pt > 75%) Chair/bed transfer assistive device: Armrests, Bedrails     Locomotion Ambulation     Max distance: 10 ft Assist level: Touching or steadying assistance (Pt > 75%)   Wheelchair   Type: Manual Max wheelchair distance: 150 ft Assist Level: Supervision or verbal cues  Cognition Comprehension Comprehension assist level: Follows basic conversation/direction with extra time/assistive device  Expression Expression assist level: Expresses basic 75 - 89% of the time/requires cueing 10 - 24% of the time. Needs helper to occlude trach/needs to repeat words.  Social Interaction Social Interaction assist level: Interacts appropriately with others with medication or extra time (anti-anxiety, antidepressant).  Problem Solving Problem solving assist level: Solves basic 90% of the time/requires cueing < 10% of the time  Memory Memory assist level: Recognizes or recalls 75 - 89% of the time/requires cueing 10 - 24% of the time    Medical Problem List and Plan: 1.Left side weakness with dysarthriasecondary to right MCA territory infarction as well as history of brain tumor many years ago with resection at [place], PT, OT, SLP  Team conference today please see physician documentation under team conference tab, met with team face-to-face to discuss problems,progress, and goals. Formulized individual treatment plan based on medical history, underlying problem  and comorbidities. 2. DVT Prophylaxis/Anticoagulation: Subcutaneous Lovenox. Monitor for any bleeding episodes 3. Pain Management:Neurontin 100 mg 3 times daily, Ultram as needed 4. Mood:Prozac 20 mg daily, Seroquel 25 mg twice daily as needed 5. Neuropsych: This patientiscapable of making decisions on hisown behalf. 6. Skin/Wound Care:Routine skin checks 7. Fluids/Electrolytes/Nutrition:Routine in and outs 8.Asymptomatic carotid stenosis. Patient followed vascular surgery [place]. 9.Hypertension. Monitor with increased mobility. Patient on Norvasc 10 mg daily, Zestoretic 20-12.5 mg daily prior to admission. Resume as needed  Elevated, but ?improving 10.Diabetes mellitus. Hemoglobin A1c 11.4.  -NovoLog 10 units 3 times daily, Lantus insulin 45 units daily. -Check blood sugars before meals and at bedtime. Diabetic teaching   CBG (last 3)  Recent Labs    12/11/17 1646 12/11/17 2132 12/12/17 0620  GLUCAP 150* 156* 113*  good in hospital control 7/3 11.Tobacco abuse. NicoDerm patch. Counseling 12.Hyperlipidemia. Lipitor/fenofibrate 13.Medical noncompliance. Counseling 14.Morbid Obesity. BMI 37.38. Dietary follow-up 15. Hyponatremia  Resolved Na 137  7/3 16. Hypomagnesemia  Improved to 1.7 today  17. Thrombocytopenia  Platelets 138 on 6/28, stable at 131K on 7/1   Continue to monitor 18.  Loose stool  LOS (Days) 5 A FACE TO FACE EVALUATION WAS PERFORMED  Charlett Blake 12/12/2017 9:01 AM

## 2017-12-12 NOTE — Progress Notes (Addendum)
Physical Therapy Session Note  Patient Details  Name: Zachary Deleon MRN: [id number] Date of Birth: 07-08-1965  Today's Date: 12/12/2017 PT Individual Time: 1102-1201 and 6213-0865 PT Individual Time Calculation (min): 59 min and 41 min  Short Term Goals: Week 1:  PT Short Term Goal 1 (Week 1): pt will perform bed<>WC transfers with min assist consistently  PT Short Term Goal 2 (Week 1): Pt will propell WC 166ft with supervision assist  PT Short Term Goal 3 (Week 1): Pt will ambulate 65ft with mod assist and LRAD  PT Short Term Goal 4 (Week 1): Pt will perform bed mobiltiy with min assist with sit<>supine and minimal use of bed rails.   Skilled Therapeutic Interventions/Progress Updates:  Treatment 1: Pt received asleep in bed, awakened & agreeable to tx. No c/o pain reported. Educated pt on anticipated d/c date, DME recommendations, and recommendation of OPPT. Pt transfers OOB with supervision and extra time. Pt dons shirt sitting EOB with extra time, multiple attempts 2/2 decreased ability to keep LUE in shirt sleeve & decreased awareness 2/2 impaired proprioception, and cuing. Therapist dons shoes total assist for time management and pt completes stand pivot to w/c with min assist, multiple attempts, cuing for hand placement and decreased safety awareness. In rehab apartment pt completes bed mobility (supine<>sitting EOB with mod I, rolling to R with supervision) with pt reporting he won't sleep on LUE/L side and pt uses RLE to assist LLE on/off of bed. Pt completes car transfer at small SUV simulated height with RW & min assist. Focused on LUE management on L hand splint with pt attaching/removing velcro strap, RUE hand placement for transfers, and sequencing and maneuvering RW to square up to surfaces with pt demonstrating some improvement as session progressed but would benefit from reinforcement. Pt ambulates 10 ft in parallel bars with mod assist with task focusing on midline orientation as pt  leans to R, weight shifting L<>R, and dynamic balance; pt with decreased weight shifting to L & decreased BLE step & stride length during task. At end of session pt transferred to recliner with steady assist and left in recliner with chair alarm donned & call bell in reach, set up with meal tray.  Treatment 2: Pt received in recliner & agreeable to tx. Pt with c/o HA & RN administered meds during session. Pt completes stand pivot transfers recliner>w/c and w/c>bed with min assist overall. Zachary Deleon rep) present and fitted pt for his personal AFO. Educated pt on appropriate footwear to wear with orthosis (no sandals, flip flops) and need to wear AFO any time he is up and walking. Pt ambulates 30 ft + 30 ft + 60 ft with RW, L AFO, and L hand orthosis with min assist with pt demonstrating decreased coordination and impaired proprioception but AFO is able to prevent L knee hyperextension although pt still has occasional slight knee buckling. Zachary plans to return to secure brace in pt's shoe to prevent it from sliding forward and causing pain at pt's achilles. Encouraged pt to participate in dynamic standing balance game at rehab party but pt declined. Pt propelled w/c back to room with R hemi technique and supervision. At end of session pt left in bed with alarm set & needs within reach; pt able to use bed rails to scoot to [facility] without assitsance.  Addendum: Educated pt on supervision level goals & pt in agreement.  Therapy Documentation Precautions:  Precautions Precautions: Fall Restrictions Weight Bearing Restrictions: No    See  Function Navigator for Current Functional Status.   Therapy/Group: Individual Therapy  Zachary Deleon 12/12/2017, 2:15 PM

## 2017-12-12 NOTE — Progress Notes (Signed)
Social Work   Zachary Deleon, Zachary Risingebecca G, LCSW  Social Worker  Physical Medicine and Rehabilitation  Patient Care Conference  Signed  Date of Service:  12/12/2017  1:34 PM          Signed          Show:Clear all [x] Manual[x] Template[] Copied  Added by: [x] Arlo Buffone, Lemar Livingsebecca G, LCSW   [] Hover for details   Inpatient RehabilitationTeam Conference and Plan of Care Update Date: 12/12/2017   Time: 10:45 AM      [facility]      Medical Record Number: [id number]  Date of Birth: 01-Jun-1966 Sex: Male         Room/Bed: 4W26C/4W26C-01 Payor Info: Payor: MEDICARE / Plan: MEDICARE PART A AND B / Product Type: *No Product type* /     Admitting Diagnosis: cva  Admit Date/Time:  12/07/2017  9:22 AM Admission Comments: No comment available    Primary Diagnosis:  <principal problem not specified> Principal Problem: <principal problem not specified>       Patient Active Problem List    Diagnosis Date Noted  . Thrombocytopenia (HCC)    . Hypomagnesemia    . Hyponatremia    . Diabetes mellitus type 2 in obese (HCC)    . 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 Left hemiparesis (HCC)    . Morbid obesity (HCC)    . Essential hypertension    . Stroke (HCC) 12/02/2017  . Type 2 diabetes mellitus without complication (HCC) 12/02/2017      Expected Discharge Date: Expected Discharge Date: 12/21/17   Team Members Present: Physician leading conference: Dr. Claudette LawsAndrew Kirsteins Social Worker Present: Dossie DerBecky Adaiah Morken, LCSW Nurse Present: Keturah BarreEd Knisley, RN PT Present: Aleda GranaVictoria Miller, PT OT Present: Rosalio LoudSarah Hoxie, OT SLP Present: Feliberto Gottronourtney Payne, SLP PPS Coordinator present : Tora DuckMarie Noel, RN, CRRN       Current Status/Progress Goal Weekly Team Focus  Medical     Cont bowel and bladder , impulsivity, Left shoulder pain, Back pain  reduce fall risk  address multiple pain issue   Bowel/Bladder     1 person asssist with ambulating or empting urinal  To continue to maintain  bladder control.   Pt will void by self at least q8 hrs.    Swallow/Nutrition/ Hydration               ADL's     min assist stand pivot transfers, supervision UB dressing, min assist bathing, mod assist LB dressing  Supervision  ADL retraining, dynamic standing balance, LUE NMR   Mobility     min/mod assist overall, AFO consult to help correct L knee hyperextension during gait, impulsive, decreased safety awareness, supervision w/c mobility in controlled environment, short distance gait with RW, pt/family education  supervision, min assist overall with LRAD  transfers, gait, activity tolerance, w/c mobility, pt/family education, NMR, balance, strengthening   Communication     Min - Supervision A  Mod I  speech intellgibility strategies sentence level    Safety/Cognition/ Behavioral Observations   Alert and oriented x4. Has trouble falling asleep.   To obtain appropiate sleeping pattern.   Pt requesst PRN for sleep when in need.    Pain     C/o headache. Agitation present.   to keep pain rate 3 or below.   Pain is to be control with PRNs when available.    Skin     No wound care            *  See Care Plan and progress notes for long and short-term goals.      Barriers to Discharge   Current Status/Progress Possible Resolutions Date Resolved   Physician     Medical stability     Progressing toward goals        Nursing                 PT  Inaccessible home environment;Decreased caregiver support;Home environment access/layout;Medication compliance                 OT                 SLP            SW Medication compliance needs to take meds at home             Discharge Planning/Teaching Needs:  Home with wife who is unemployed and can assist pt at home. She is here daily and observing pt in therapies.      Team Discussion:  Goals supervision-min assist level. Balance and strengthening working on. Speech intelligibility is improving. Impulsive at times and wants to move  quickly. AFO ordered. Voltaren gel for knee along with K-pad and muscle relaxer. Good return in left arm and hand. Wife has been in for observation in therapies.  Revisions to Treatment Plan:  DC 7/12    Continued Need for Acute Rehabilitation Level of Care: The patient requires daily medical management by a physician with specialized training in physical medicine and rehabilitation for the following conditions: Daily direction of a multidisciplinary physical rehabilitation program to ensure safe treatment while eliciting the highest outcome that is of practical value to the patient.: Yes Daily medical management of patient stability for increased activity during participation in an intensive rehabilitation regime.: Yes Daily analysis of laboratory values and/or radiology reports with any subsequent need for medication adjustment of medical intervention for : Neurological problems;Diabetes problems;Mood/behavior problems   Lucy [name] 12/12/2017, 1:34 PM                 Patient ID: Zachary Deleon, male   DOB: 05/10/1966, 52 y.o.   MRN: [id number]

## 2017-12-12 NOTE — Patient Care Conference (Signed)
Inpatient RehabilitationTeam Conference and Plan of Care Update Date: 12/12/2017   Time: 10:45 AM    Patient Name: Zachary Deleon [facility]      Medical Record Number: [id number]  Date of Birth: [birth year] Sex: Male         Room/Bed: 4W26C/4W26C-01 Payor Info: Payor: MEDICARE / Plan: MEDICARE PART A AND B / Product Type: *No Product type* /    Admitting Diagnosis: cva  Admit Date/Time:  12/07/2017  9:22 AM Admission Comments: No comment available   Primary Diagnosis:  <principal problem not specified> Principal Problem: <principal problem not specified>  Patient Active Problem List   Diagnosis Date Noted  . Thrombocytopenia (HCC)   . Hypomagnesemia   . Hyponatremia   . Diabetes mellitus type 2 in obese (HCC)   . 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 Left hemiparesis (HCC)   . Morbid obesity (HCC)   . Essential hypertension   . Stroke (HCC) 12/02/2017  . Type 2 diabetes mellitus without complication (HCC) 12/02/2017    Expected Discharge Date: Expected Discharge Date: 12/21/17  Team Members Present: Physician leading conference: Dr. Claudette Laws Social Worker Present: Zachary Der, LCSW Nurse Present: Zachary Barre, RN PT Present: Zachary Deleon, PT OT Present: Zachary Deleon, OT SLP Present: Zachary Deleon, SLP PPS Coordinator present : Zachary Duck, RN, CRRN     Current Status/Progress Goal Weekly Team Focus  Medical   Cont bowel and bladder , impulsivity, Left shoulder pain, Back pain  reduce fall risk  address multiple pain issue   Bowel/Bladder   1 person asssist with ambulating or empting urinal  To continue to maintain bladder control.   Pt will void by self at least q8 hrs.    Swallow/Nutrition/ Hydration             ADL's   min assist stand pivot transfers, supervision UB dressing, min assist bathing, mod assist LB dressing  Supervision  ADL retraining, dynamic standing balance, LUE NMR   Mobility   min/mod assist overall, AFO consult to help correct  L knee hyperextension during gait, impulsive, decreased safety awareness, supervision w/c mobility in controlled environment, short distance gait with RW, pt/family education  supervision, min assist overall with LRAD  transfers, gait, activity tolerance, w/c mobility, pt/family education, NMR, balance, strengthening   Communication   Min - Supervision A  Mod I  speech intellgibility strategies sentence level    Safety/Cognition/ Behavioral Observations  Alert and oriented x4. Has trouble falling asleep.   To obtain appropiate sleeping pattern.   Pt requesst PRN for sleep when in need.    Pain   C/o headache. Agitation present.   to keep pain rate 3 or below.   Pain is to be control with PRNs when available.    Skin   No wound care             *See Care Plan and progress notes for long and short-term goals.     Barriers to Discharge  Current Status/Progress Possible Resolutions Date Resolved   Physician    Medical stability     Progressing toward goals         Nursing                  PT  Inaccessible home environment;Decreased caregiver support;Home environment access/layout;Medication compliance                 OT  SLP                SW Medication compliance needs to take meds at home            Discharge Planning/Teaching Needs:  Home with wife who is unemployed and can assist pt at home. She is here daily and observing pt in therapies.      Team Discussion:  Goals supervision-min assist level. Balance and strengthening working on. Speech intelligibility is improving. Impulsive at times and wants to move quickly. AFO ordered. Voltaren gel for knee along with K-pad and muscle relaxer. Good return in left arm and hand. Wife has been in for observation in therapies.  Revisions to Treatment Plan:  DC 7/12    Continued Need for Acute Rehabilitation Level of Care: The patient requires daily medical management by a physician with specialized training in physical  medicine and rehabilitation for the following conditions: Daily direction of a multidisciplinary physical rehabilitation program to ensure safe treatment while eliciting the highest outcome that is of practical value to the patient.: Yes Daily medical management of patient stability for increased activity during participation in an intensive rehabilitation regime.: Yes Daily analysis of laboratory values and/or radiology reports with any subsequent need for medication adjustment of medical intervention for : Neurological problems;Diabetes problems;Mood/behavior problems  Zachary Deleon, Zachary Deleon 12/12/2017, 1:34 PM

## 2017-12-12 NOTE — Progress Notes (Signed)
Speech Language Pathology Daily Session Note  Patient Details  Name: Zachary Deleon MRN: [id number] Date of Birth: 25-Sep-1965  Today's Date: 12/12/2017 SLP Individual Time: [id number] SLP Individual Time Calculation (min): 45 min  Short Term Goals: Week 1: SLP Short Term Goal 1 (Week 1): Pt will utilize speech intelligibility strategies to achieve 90%-100% intelligibility at the sentence level given supervision cues. SLP Short Term Goal 2 (Week 1): Pt will complete basic + functional problem-solving tasks including time management, basic calculations, and medication management given supervision cues.  SLP Short Term Goal 3 (Week 1): Pt will complete functional visuospatial/executive functioning tasks with 90% accuracy given supervision cues.  SLP Short Term Goal 4 (Week 1): Pt will selectively attend to task in a mildly distracting environment for up to 30 minutes with Mod I. SLP Short Term Goal 5 (Week 1): Pt will self-monitor and correct functional errors with supervision A verbal cues.   Skilled Therapeutic Interventions: Skilled treatment session focused on cognitive goals. SLP facilitated session by providing Mod A verbal cues for recall of his current medications and their functions. With encouragement and Min A verbal cues for problem solving, patient organized a QD pill box. Patient also required Min-Mod A verbal cues for selective attention to task for ~10 min intervals in a minimally distracting environment. Patient left sitting EOB with RN present. Continue with current plan of care.      Function:   Cognition Comprehension Comprehension assist level: Follows basic conversation/direction with extra time/assistive device  Expression   Expression assist level: Expresses basic 75 - 89% of the time/requires cueing 10 - 24% of the time. Needs helper to occlude trach/needs to repeat words.  Social Interaction Social Interaction assist level: Interacts appropriately with others with  medication or extra time (anti-anxiety, antidepressant).  Problem Solving Problem solving assist level: Solves basic 90% of the time/requires cueing < 10% of the time  Memory Memory assist level: Recognizes or recalls 75 - 89% of the time/requires cueing 10 - 24% of the time    Pain Pain in head and back. RN aware and administered medications   Therapy/Group: Individual Therapy  [name] 12/12/2017, 1:22 PM

## 2017-12-12 NOTE — Progress Notes (Signed)
Occupational Therapy Session Note  Patient Details  Name: Zachary Deleon MRN: [id number] Date of Birth: [birth year]  Today's Date: 12/12/2017 OT Individual Time: 0815-0900 OT Individual Time Calculation (min): 45 min    Short Term Goals: Week 1:  OT Short Term Goal 1 (Week 1): Pt will complete bathing with min assist at sit > stand level OT Short Term Goal 2 (Week 1): Pt will complete 2/3 toileting tasks with min assist OT Short Term Goal 3 (Week 1): Pt will complete LB dressing with mod assist at sit > stand level OT Short Term Goal 4 (Week 1): Pt will utilize LUE as gross assist when opening containers to complete self-care tasks  Skilled Therapeutic Interventions/Progress Updates:    Treatment session with focus on LUE NMR.  Pt received seated EOB having just finished with SLP.  Pt expressing desire to focus on LUE.  Therapist applied kinesiotape to Lt shoulder to increase stability at joint to decrease pain and minimize onset of subluxation.  Engaged in LUE NMR in sitting with focus on fine motor control progressing from one at a time to in-hand manipulation and translation of small foam pieces to place in to cup.  Pt dropping 10% of pieces, requiring increased time to complete task in coordinated fashion.  Pt demonstrating improved range when reaching across midline and laterally out to side.  Pt with need to toilet.  Completed stand pivot transfer bed > w/c > toilet and back with min assist.  Pt left seated EOB with bed alarm on and all needs in reach.  Therapy Documentation Precautions:  Precautions Precautions: Fall Restrictions Weight Bearing Restrictions: No Pain:  Pt with c/o pain in lower back.  MD aware.  See Function Navigator for Current Functional Status.   Therapy/Group: Individual Therapy  Rosalio LoudHOXIE, Treyvonne Tata 12/12/2017, 2:21 PM

## 2017-12-12 NOTE — Progress Notes (Signed)
Social Work Patient ID: Zachary Deleon, male   DOB: 1966/02/27, 52 y.o.   MRN: [id number]  Met whit pt and spoke with wife via telephone to discuss team conference goals-supervision-min assist and target discharge date 7/12. Both are pleased with who well he is doing and expecting him to do well here. Pt realizes he needs to push himself to achieve the maximum gain. Will work on discharge needs for next Friday.

## 2017-12-13 ENCOUNTER — Inpatient Hospital Stay (HOSPITAL_COMMUNITY): Payer: Medicare Other | Admitting: *Deleted

## 2017-12-13 ENCOUNTER — Inpatient Hospital Stay (HOSPITAL_COMMUNITY): Payer: Medicare Other | Admitting: Occupational Therapy

## 2017-12-13 ENCOUNTER — Inpatient Hospital Stay (HOSPITAL_COMMUNITY): Payer: Medicare Other | Admitting: Speech Pathology

## 2017-12-13 LAB — GLUCOSE, CAPILLARY
Glucose-Capillary: 108 mg/dL — ABNORMAL HIGH (ref 70–99)
Glucose-Capillary: 114 mg/dL — ABNORMAL HIGH (ref 70–99)
Glucose-Capillary: 125 mg/dL — ABNORMAL HIGH (ref 70–99)
Glucose-Capillary: 150 mg/dL — ABNORMAL HIGH (ref 70–99)

## 2017-12-13 MED ORDER — QUETIAPINE FUMARATE 25 MG PO TABS
12.5000 mg | ORAL_TABLET | Freq: Two times a day (BID) | ORAL | Status: DC | PRN
  Administered 2017-12-13 – 2017-12-18 (×3): 12.5 mg via ORAL
  Filled 2017-12-13 (×4): qty 1

## 2017-12-13 NOTE — Progress Notes (Signed)
Physical Therapy Session Note  Patient Details  Name: Zachary Deleon MRN: [id number] Date of Birth: [birth year]  Today's Date: 12/13/2017 PT Individual Time: 1030-1200 PT Individual Time Calculation (min): 90 min   Short Term Goals: Week 1:  PT Short Term Goal 1 (Week 1): pt will perform bed<>WC transfers with min assist consistently  PT Short Term Goal 2 (Week 1): Pt will propell WC 16400ft with supervision assist  PT Short Term Goal 3 (Week 1): Pt will ambulate 4030ft with mod assist and LRAD  PT Short Term Goal 4 (Week 1): Pt will perform bed mobiltiy with min assist with sit<>supine and minimal use of bed rails.   Skilled Therapeutic Interventions/Progress Updates:    Tx focused on functional mobility training, gait with rail and RW, and NMR via forced use, manual facilitation, and multi-modal cues for forced-use R LE/UE, and safety. Pt up in recliner, ready to go. Attempted donning AFO, but unable to get comfortable fit after multiple attempts.  Pt propelled WC with bil LEs only for increased coordination and NMR. Pt challenged to use LUE as well with grip on wheel.  Sit<>stands with hall rail R and RW with min A overall and multi-modal cues for midline orientation and forced use LLE and weight shift. Serial sit<>stands from elevated mat for forced use LLE x10 with mod A to achieve weight shift.  Static standing and lateral weight shifts with mirror for visual feedback with up to Mod A fo control LOB.  Gait at hall rail x30' and gait with RW and orthosis x70' with Min A overall and multi-modal cues for gait pattern and LLE control. ACE wrap on knee for support, hyperext noted with fatigue.  Seated rest breaks with hip ADD ball squeeze 2x10 and LUE FMC task.  Stand-step transfers x4 throughout tx with Mod A to safely complete turn. Pt left EOB with alarm on and encouraged to get back OOB after nap with assistance.  Continue per PT POC   Therapy Documentation Precautions:   Precautions Precautions: Fall Restrictions Weight Bearing Restrictions: No General:   Vital Signs: Oxygen Therapy SpO2: 98 % O2 Device: Room Air Pain: None before and after tx   See Function Navigator for Current Functional Status.   Therapy/Group: Individual Therapy  Shannia Jacuinde, Chrisandra NettersOLE M  Ottilia Pippenger, PT, DPT  12/13/2017, 11:42 AM

## 2017-12-13 NOTE — Progress Notes (Signed)
Occupational Therapy Session Note  Patient Details  Name: Zachary Deleon MRN: [id number] Date of Birth: 11/22/1965  Today's Date: 12/13/2017 OT Individual Time: 0703-0800 OT Individual Time Calculation (min): 57 min    Short Term Goals: Week 1:  OT Short Term Goal 1 (Week 1): Pt will complete bathing with min assist at sit > stand level OT Short Term Goal 2 (Week 1): Pt will complete 2/3 toileting tasks with min assist OT Short Term Goal 3 (Week 1): Pt will complete LB dressing with mod assist at sit > stand level OT Short Term Goal 4 (Week 1): Pt will utilize LUE as gross assist when opening containers to complete self-care tasks  Skilled Therapeutic Interventions/Progress Updates:    Treatment session with focus on functional transfers and use of LUE.  Pt received supine in bed sound asleep. Required increased time to arouse.  Pt angry upon awakening and required increased time to fully arouse - LPN question if sleeping medication still in pt system and whether too much.  Supervision bed mobility to sit on EOB for breakfast.  Pt able to utilize LUE as gross assist to stabilize items while opening with dominant RUE.  Pt reports need to toilet.  Completed stand pivot transfer bed > w/c > toilet with min assist.  Pt able to doff shorts in standing with min assist for standing balance and complete hygiene post BM with lateral leans in sitting.  Pt required assistance to pull shorts over hips post toileting due to instability in standing and Lt knee buckling.  Completed grooming tasks at sink prior to returning to breakfast.  Provided pt with clothespins and nuts and bolts to continue to address LUE gross and fine motor coordination.  Pt able to complete both tasks with increased time and effort.  Encouraged pt to complete tasks during down time to continue to address LUE.  Pt left seated EOB to finish breakfast, bed alarm set.  Therapy Documentation Precautions:  Precautions Precautions:  Fall Restrictions Weight Bearing Restrictions: No General:   Vital Signs: Therapy Vitals Temp: 97.9 F (36.6 C) Temp Source: Oral Pulse Rate: (!) 55 Resp: 20 BP: (!) 144/70 Patient Position (if appropriate): Lying Oxygen Therapy SpO2: 98 % O2 Device: Room Air Pain:  Pt with c/o pain in LUE and back spasms.  RN notified  See Function Navigator for Current Functional Status.   Therapy/Group: Individual Therapy  Rosalio LoudHOXIE, Oluwatobiloba Martin 12/13/2017, 8:17 AM

## 2017-12-13 NOTE — Progress Notes (Addendum)
[place] PHYSICAL MEDICINE & REHABILITATION     PROGRESS NOTE  Subjective/Complaints:   Left hand numb, difficulty opening clips with pinch C/o strange dreams with seroquel requests lower dose   ROS: - CP on left , no pain with exertion, no abd pain no N/V/D  Objective: Vital Signs: Blood pressure (!) 144/70, pulse (!) 55, temperature 97.9 F (36.6 C), temperature source Oral, resp. rate 20, height 6\' 2"  (1.88 m), weight 132 kg (291 lb 1.6 oz), SpO2 98 %. No results found. No results for input(s): WBC, HGB, HCT, PLT in the last 72 hours. Recent Labs    12/12/17 0539  NA 137  K 3.8  CL 105  GLUCOSE 118*  BUN 8  CREATININE 0.89  CALCIUM 8.7*   CBG (last 3)  Recent Labs    12/12/17 1656 12/12/17 2221 12/13/17 0623  GLUCAP 88 125* 108*    Wt Readings from Last 3 Encounters:  12/07/17 132 kg (291 lb 1.6 oz)  12/06/17 133.4 kg (294 lb)    Physical Exam:  BP (!) 144/70 (BP Location: Right Arm)   Pulse (!) 55   Temp 97.9 F (36.6 C) (Oral)   Resp 20   Ht 6\' 2"  (1.88 m)   Wt 132 kg (291 lb 1.6 oz)   SpO2 98%   BMI 37.38 kg/m  Constitutional: He appearswell-developed.No distress.obese HENT: Normocephalicand atraumatic.  Eyes:EOMare normal. No discharge. Cardiovascular:RRR. No JVD.Ecchymosis on left side ant ax line lower rib areaRespiratory:Effort normal. Clear. VW:UJWJ.XBJYNWGNFAOZ. Musculoskeletal: Left upper extremity tenderness.pain with L shoulder ROM.  Pain to plap over the left side lower ribs orse at ant axillary line, low back paraspinal tenderness    Neurological: He isalertand oriented. Dysarthria Left facial weakness Motor: LUE: 3-/5 proximal to distal (improved) LLE: 3-/5 proximal to distal, trace ankle on left Absent LT sensation LLE, intact LUE RUE/RLE 5/5 proximal to distal. Skin: Skin iswarmand dry.  Psychiatric: He has anormal mood and affect. Hisbehavior is normal.  Assessment/Plan: 1. Functional deficits secondary to  right MCA territory infarction as well as history of brain tumor which require 3+ hours per day of interdisciplinary therapy in a comprehensive inpatient rehab setting. Physiatrist is providing close team supervision and 24 hour management of active medical problems listed below. Physiatrist and rehab team continue to assess barriers to discharge/monitor patient progress toward functional and medical goals.  Function:  Bathing Bathing position   Position: Shower  Bathing parts Body parts bathed by patient: Right arm, Left arm, Abdomen, Chest, Front perineal area, Buttocks, Right upper leg, Left upper leg, Right lower leg, Left lower leg Body parts bathed by helper: Back  Bathing assist Assist Level: Touching or steadying assistance(Pt > 75%)      Upper Body Dressing/Undressing Upper body dressing   What is the patient wearing?: Pull over shirt/dress     Pull over shirt/dress - Perfomed by patient: Thread/unthread right sleeve, Put head through opening, Pull shirt over trunk, Thread/unthread left sleeve Pull over shirt/dress - Perfomed by helper: Thread/unthread left sleeve        Upper body assist Assist Level: Supervision or verbal cues, Set up   Set up : To obtain clothing/put away  Lower Body Dressing/Undressing Lower body dressing   What is the patient wearing?: Underwear, Pants, Socks Underwear - Performed by patient: Thread/unthread right underwear leg, Thread/unthread left underwear leg Underwear - Performed by helper: Pull underwear up/down Pants- Performed by patient: Thread/unthread right pants leg Pants- Performed by helper: Thread/unthread left pants leg,  Pull pants up/down   Non-skid slipper socks- Performed by helper: Don/doff right sock, Don/doff left sock Socks - Performed by patient: Don/doff right sock, Don/doff left sock   Shoes - Performed by patient: Don/doff right shoe Shoes - Performed by helper: Don/doff left shoe, Fasten right, Fasten left           Lower body assist Assist for lower body dressing: Touching or steadying assistance (Pt > 75%)(Mod assist)      Toileting Toileting   Toileting steps completed by patient: Adjust clothing prior to toileting, Performs perineal hygiene Toileting steps completed by helper: Adjust clothing after toileting Toileting Assistive Devices: Grab bar or rail  Toileting assist Assist level: Touching or steadying assistance (Pt.75%)(Mod assist)   Transfers Chair/bed transfer   Chair/bed transfer method: Ambulatory Chair/bed transfer assist level: Touching or steadying assistance (Pt > 75%) Chair/bed transfer assistive device: Walker, Orthosis(L hand splint orthotic)     Locomotion Ambulation     Max distance: 70 ft  Assist level: Touching or steadying assistance (Pt > 75%)   Wheelchair   Type: Manual Max wheelchair distance: 150 ft Assist Level: Supervision or verbal cues  Cognition Comprehension Comprehension assist level: Follows basic conversation/direction with extra time/assistive device  Expression Expression assist level: Expresses basic 75 - 89% of the time/requires cueing 10 - 24% of the time. Needs helper to occlude trach/needs to repeat words.  Social Interaction Social Interaction assist level: Interacts appropriately with others with medication or extra time (anti-anxiety, antidepressant).  Problem Solving Problem solving assist level: Solves complex 90% of the time/cues < 10% of the time  Memory Memory assist level: Recognizes or recalls 90% of the time/requires cueing < 10% of the time    Medical Problem List and Plan: 1.Left side weakness with dysarthriasecondary to right MCA territory infarction as well as history of brain tumor many years ago [facility]  Cont CIR, PT, OT, SLP  Discussed recovery of sensation timeline 2. DVT Prophylaxis/Anticoagulation: Subcutaneous Lovenox. Monitor for any bleeding episodes 3. Pain Management:Neurontin 100 mg  3 times daily, Ultram as needed 4. Mood:Prozac 20 mg daily, reduce Seroquel to  12.5 mg twice daily as needed 5. Neuropsych: This patientiscapable of making decisions on hisown behalf. 6. Skin/Wound Care:Routine skin checks 7. Fluids/Electrolytes/Nutrition:Routine in and outs 8.Asymptomatic carotid stenosis. [facility] Dr.Velasquez Derrell Lollingamirez. 9.Hypertension. Monitor with increased mobility. Patient on Norvasc 10 mg daily, Zestoretic 20-12.5 mg daily prior to admission. Resume as needed  Elevated, but ?improving 10.Diabetes mellitus. Hemoglobin A1c 11.4.  -NovoLog 10 units 3 times daily, Lantus insulin 45 units daily. -Check blood sugars before meals and at bedtime. Diabetic teaching   CBG (last 3)  Recent Labs    12/12/17 1656 12/12/17 2221 12/13/17 0623  GLUCAP 88 125* 108*  good in hospital control 7/4 11.Tobacco abuse. NicoDerm patch. Counseling 12.Hyperlipidemia. Lipitor/fenofibrate 13.Medical noncompliance. Counseling 14.Morbid Obesity. BMI 37.38. Dietary follow-up  16. Hypomagnesemia  Improved to 1.7 7/3  17. Thrombocytopenia  Platelets 138 on 6/28, stable at 131K on 7/1   Continue to monitor- no sign of hemorrhage 18.  Loose stool  LOS (Days) 6 A FACE TO FACE EVALUATION WAS PERFORMED  Erick Colacendrew E Usiel Astarita 12/13/2017 9:39 AM

## 2017-12-13 NOTE — Progress Notes (Signed)
Speech Language Pathology Daily Session Note  Patient Details  Name: Zachary Deleon MRN: [id number] Date of Birth: 1965-10-12  Today's Date: 12/13/2017 SLP Individual Time: [id number] SLP Individual Time Calculation (min): 55 min  Short Term Goals: Week 1: SLP Short Term Goal 1 (Week 1): Pt will utilize speech intelligibility strategies to achieve 90%-100% intelligibility at the sentence level given supervision cues. SLP Short Term Goal 2 (Week 1): Pt will complete basic + functional problem-solving tasks including time management, basic calculations, and medication management given supervision cues.  SLP Short Term Goal 3 (Week 1): Pt will complete functional visuospatial/executive functioning tasks with 90% accuracy given supervision cues.  SLP Short Term Goal 4 (Week 1): Pt will selectively attend to task in a mildly distracting environment for up to 30 minutes with Mod I. SLP Short Term Goal 5 (Week 1): Pt will self-monitor and correct functional errors with supervision A verbal cues.   Skilled Therapeutic Interventions: Skilled treatment session focused on cognitive goals. SLP facilitated session by providing supervision verbal cues for complex problem solving during a novel card task. Patient was Mod I for recall for procedures to task and for events in previous therapy session. Patient also utilized his LUE throughout task with extra time. Patient transferred to the recliner at end of session with supervision verbal cues for impulsivity. Patient left with alarm on and all needs within reach. Continue with current plan of care.      Function:  Cognition Comprehension Comprehension assist level: Follows basic conversation/direction with extra time/assistive device  Expression   Expression assist level: Expresses basic 75 - 89% of the time/requires cueing 10 - 24% of the time. Needs helper to occlude trach/needs to repeat words.  Social Interaction Social Interaction assist level:  Interacts appropriately with others with medication or extra time (anti-anxiety, antidepressant).  Problem Solving Problem solving assist level: Solves complex 90% of the time/cues < 10% of the time  Memory Memory assist level: Recognizes or recalls 90% of the time/requires cueing < 10% of the time    Pain Pain Assessment Pain Scale: 0-10 Pain Score: 0-No pain Pain Type: Acute pain Pain Location: Head Pain Orientation: Posterior Pain Descriptors / Indicators: Headache Pain Frequency: Intermittent Pain Onset: On-going Patients Stated Pain Goal: 4 Pain Intervention(s): Medication (See eMAR)  Therapy/Group: Individual Therapy  Zachary Deleon 12/13/2017, 9:33 AM

## 2017-12-14 ENCOUNTER — Inpatient Hospital Stay (HOSPITAL_COMMUNITY): Payer: Medicare Other | Admitting: Speech Pathology

## 2017-12-14 ENCOUNTER — Inpatient Hospital Stay (HOSPITAL_COMMUNITY): Payer: Medicare Other | Admitting: Physical Therapy

## 2017-12-14 ENCOUNTER — Inpatient Hospital Stay (HOSPITAL_COMMUNITY): Payer: Medicare Other | Admitting: Occupational Therapy

## 2017-12-14 LAB — GLUCOSE, CAPILLARY
GLUCOSE-CAPILLARY: 89 mg/dL (ref 70–99)
Glucose-Capillary: 86 mg/dL (ref 70–99)
Glucose-Capillary: 91 mg/dL (ref 70–99)
Glucose-Capillary: 124 mg/dL — ABNORMAL HIGH (ref 70–99)

## 2017-12-14 LAB — CREATININE, SERUM
Creatinine, Ser: 0.95 mg/dL (ref 0.61–1.24)
GFR calc Af Amer: >60 mL/min (ref >60)
GFR calc non Af Amer: >60 mL/min (ref >60)

## 2017-12-14 MED ORDER — IRBESARTAN 75 MG PO TABS
150.0000 mg | ORAL_TABLET | Freq: Every day | ORAL | Status: DC
  Administered 2017-12-14 – 2017-12-17 (×4): 150 mg via ORAL
  Filled 2017-12-14 (×4): qty 2

## 2017-12-14 NOTE — Progress Notes (Signed)
Speech Language Pathology Weekly Progress and Session Note  Patient Details  Name: Zachary Deleon MRN: [id number] Date of Birth: 08-12-1965  Beginning of progress report period: December 08, 2017 End of progress report period: December 14, 2017  Today's Date: 12/14/2017 SLP Individual Time: 7654-6503 SLP Individual Time Calculation (min): 45 min  Short Term Goals: Week 1: SLP Short Term Goal 1 (Week 1): Pt will utilize speech intelligibility strategies to achieve 90%-100% intelligibility at the sentence level given supervision cues. SLP Short Term Goal 1 - Progress (Week 1): Met SLP Short Term Goal 2 (Week 1): Pt will complete basic + functional problem-solving tasks including time management, basic calculations, and medication management given supervision cues.  SLP Short Term Goal 2 - Progress (Week 1): Met SLP Short Term Goal 3 (Week 1): Pt will complete functional visuospatial/executive functioning tasks with 90% accuracy given supervision cues.  SLP Short Term Goal 3 - Progress (Week 1): Met SLP Short Term Goal 4 (Week 1): Pt will selectively attend to task in a mildly distracting environment for up to 30 minutes with Mod I. SLP Short Term Goal 4 - Progress (Week 1): Met SLP Short Term Goal 5 (Week 1): Pt will self-monitor and correct functional errors with supervision A verbal cues.  SLP Short Term Goal 5 - Progress (Week 1): Met    New Short Term Goals: Week 2: SLP Short Term Goal 1 (Week 2): Pt will utilize speech intelligibility strategies to achieve 90%-100% intelligibility at the simple conversation level with supervision cues. SLP Short Term Goal 2 (Week 2): Pt will selectively attend to task in a moderately distracting environment for up to 30 minutes with Mod I. SLP Short Term Goal 3 (Week 2): Pt will complete semi-complex reasoning tasks with supervision cues.  SLP Short Term Goal 4 (Week 2): Pt will demonstrate anticipatory awareness by listing 3 activities that are safe and not  safe to perform within the home environment with supervision cues.   Weekly Progress Updates: Pt has made good progress this reporting period as evidenced by 5 of 5 STGs. He continues to benefit from skilled ST to target semi-complex problem solving, anticipatory awareness, selective attention and speech intelligibility. Anticipate that pt might need initial 24 hour supervision at discharge but will achieve Mod I in some areas.      Intensity: Minumum of 1-2 x/day, 30 to 90 minutes Frequency: 1 to 3 out of 7 days Duration/Length of Stay: 7/12 Treatment/Interventions: Cognitive remediation/compensation;Internal/external aids;Patient/family education;Functional tasks;Medication managment   Daily Session  Skilled Therapeutic Interventions: Skilled treatment session focused on cognition goals. SLP facilitated session by discussing noncompliance with medication for last 6 months. 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 MD and team made aware of comments. However pt currently remains very motivated to gain independence and not be dependent on wife for care. Unsure if pt believes that discontinuing medication led to CVA. Will continue to target awareness. Pt left in bed, bed alarm on and all needs within reach. Continue per current plan of care.       Function:   Eating Eating   Modified Consistency Diet: No Eating Assist Level: No help, No cues   Eating Set Up Assist For: Opening containers       Cognition Comprehension Comprehension assist level: Follows basic conversation/direction with extra time/assistive device  Expression   Expression assist level: Expresses basic needs/ideas: With no assist;Expresses complex 90% of the time/cues <  10% of the time  Social Interaction Social Interaction assist level: Interacts appropriately with others with medication or extra time (anti-anxiety, antidepressant).  Problem Solving  Problem solving assist level: Solves complex 90% of the time/cues < 10% of the time  Memory Memory assist level: Recognizes or recalls 90% of the time/requires cueing < 10% of the time   General    Pain Pain Assessment Pain Scale: 0-10 Pain Score: 3  Pain Location: Shoulder Pain Orientation: Right Pain Descriptors / Indicators: Headache Pain Onset: With Activity Patients Stated Pain Goal: 2 Pain Intervention(s): Medication (See eMAR)(tylenol) Multiple Pain Sites: No  Therapy/Group: Individual Therapy  Jenesis Martin 12/14/2017, 11:22 AM

## 2017-12-14 NOTE — Plan of Care (Signed)
  Problem: Consults Goal: RH STROKE PATIENT EDUCATION Description See Patient Education module for education specifics  Outcome: Progressing Goal: Diabetes Guidelines if Diabetic/Glucose > 140 Description If diabetic or lab glucose is > 140 mg/dl - Initiate Diabetes/Hyperglycemia Guidelines & Document Interventions  Outcome: Progressing   Problem: RH BLADDER ELIMINATION Goal: RH STG MANAGE BLADDER WITH ASSISTANCE Description STG Manage Bladder With min Assistance  Outcome: Progressing   Problem: RH SAFETY Goal: RH STG ADHERE TO SAFETY PRECAUTIONS W/ASSISTANCE/DEVICE Description STG Adhere to Safety Precautions With supervision Assistance/Device.  Outcome: Progressing   Problem: RH PAIN MANAGEMENT Goal: RH STG PAIN MANAGED AT OR BELOW PT'S PAIN GOAL Description < 5  Outcome: Progressing   Problem: RH KNOWLEDGE DEFICIT Goal: RH STG INCREASE KNOWLEDGE OF DIABETES Description Min assist  Outcome: Progressing Goal: RH STG INCREASE KNOWLEDGE OF HYPERTENSION Description Min assist.  Outcome: Progressing   

## 2017-12-14 NOTE — Progress Notes (Addendum)
Physical Therapy Weekly Progress Note  Patient Details  Name: Zachary Deleon MRN: [id number] Date of Birth: Nov 29, 1965  Beginning of progress report period: December 08, 2017 End of progress report period: December 14, 2017  Today's Date: 12/14/2017  Patient has met 4 of 4 short term goals.  Pt is making good progress towards LTGs. Pt now has his own personal AFO and is awaiting [employer] conservator/restorer rep) to secure it in his shoe to prevent it from sliding forward & causing L heel pain. Pt can ambulate with RW, L AFO, & L hand orthosis with min assist but is still limited by impaired proprioception LLE but L knee hyperextension is improved with L AFO. Pt would benefit from continued skilled PT treatment to focus on equal weight bearing through BLE to improve balance & posture during all standing/walking tasks. Pt would also benefit from further strengthening, neuromuscular re-ed, and endurance training to increase independence with all functional tasks and for pt/family education prior to d/c.  Patient continues to demonstrate the following deficits muscle weakness, decreased cardiorespiratoy endurance, decreased coordination, decreased attention to left, decreased awareness and decreased safety awareness, and decreased standing balance, decreased postural control, hemiplegia and decreased balance strategies and therefore will continue to benefit from skilled PT intervention to increase functional independence with mobility.  Patient progressing toward long term goals..  Plan of care revisions: Ambulation goals upgraded to supervision with LRAD.[name]  PT Short Term Goals Week 1:  PT Short Term Goal 1 (Week 1): pt will perform bed<>WC transfers with min assist consistently  PT Short Term Goal 1 - Progress (Week 1): Met PT Short Term Goal 2 (Week 1): Pt will propell WC 19f with supervision assist  PT Short Term Goal 2 - Progress (Week 1): Met PT Short Term Goal 3 (Week 1): Pt will ambulate 397fwith mod assist and  LRAD  PT Short Term Goal 3 - Progress (Week 1): Met PT Short Term Goal 4 (Week 1): Pt will perform bed mobiltiy with min assist with sit<>supine and minimal use of bed rails.  PT Short Term Goal 4 - Progress (Week 1): Met Week 2:  PT Short Term Goal 1 (Week 2): STG = LTG due to estimated d/c date.   Therapy Documentation Precautions:  Precautions Precautions: Fall Restrictions Weight Bearing Restrictions: No   See Function Navigator for Current Functional Status.  Therapy/Group: Individual Therapy  ViWaunita Schooner/10/2017, 12:26 PM

## 2017-12-14 NOTE — Progress Notes (Signed)
Occupational Therapy Weekly Progress Note  Patient Details  Name: PHINEAS MCENROE MRN: [id number] Date of Birth: 1965-07-14  Beginning of progress report period: December 08, 2017 End of progress report period: December 14, 2017  Today's Date: 12/14/2017 OT Individual Time: 6270-3500 OT Individual Time Calculation (min): 58 min    Patient has met 4 of 4 short term goals.  Pt is making steady progress towards goals.  Pt currently requires min assist with stand pivot transfers to bed, w/c, toilet, and tub bench.  Pt is demonstrating improved standing balance during self-care tasks requiring min/steady assist when washing and pulling pants over hips.  Pt is demonstrating improved range, strength, and functional use of LUE during self-care tasks and gross and fine motor therapeutic activities.  Pt continues to demonstrate decreased sensation and proprioception in LUE and LLE requiring cues for safe positioning.  Pt is impulsive by nature which has been exacerbated by his stroke and will require supervision during self-care tasks and mobility to increase safety.    Patient continues to demonstrate the following deficits: muscle weakness, decreased cardiorespiratoy endurance, impaired timing and sequencing, unbalanced muscle activation and decreased coordination, decreased awareness of LUE, decreased awareness, decreased problem solving and decreased safety awareness and decreased standing balance, hemiplegia and decreased balance strategies and therefore will continue to benefit from skilled OT intervention to enhance overall performance with BADL and Reduce care partner burden.  Patient progressing toward long term goals..  Continue plan of care.  OT Short Term Goals Week 1:  OT Short Term Goal 1 (Week 1): Pt will complete bathing with min assist at sit > stand level OT Short Term Goal 1 - Progress (Week 1): Met OT Short Term Goal 2 (Week 1): Pt will complete 2/3 toileting tasks with min assist OT Short Term  Goal 2 - Progress (Week 1): Met OT Short Term Goal 3 (Week 1): Pt will complete LB dressing with mod assist at sit > stand level OT Short Term Goal 3 - Progress (Week 1): Met OT Short Term Goal 4 (Week 1): Pt will utilize LUE as gross assist when opening containers to complete self-care tasks OT Short Term Goal 4 - Progress (Week 1): Met Week 2:  OT Short Term Goal 1 (Week 2): Pt will complete LB dressing with min assist OT Short Term Goal 2 (Week 2): Pt will don shoes with AFO with min assist OT Short Term Goal 3 (Week 2): Pt will complete 3/3 toileting tasks with min assist for standing balance OT Short Term Goal 4 (Week 2): Pt will complete toilet transfers with supervision with LRAD  Skilled Therapeutic Interventions/Progress Updates:    Treatment session with focus on ADL retraining with dynamic standing balance and functional use of LUE during bathing and dressing.  Pt received seated EOB in much better spirits this AM.  Pt willing to engage in bathing at shower level.  Completed stand pivot transfer bed > w/c > tub bench in room shower with min assist.  Pt completed bathing utilizing figure 4 position to wash lower legs and feet and min/steady assist for balance when washing buttocks.  Pt with improved ROM in LUE allowing him to wash Rt shoulder and underarm.  Pt donned underwear and shorts in figure 4 position with cues for hemi-technique to increase success when threading clothing.  Pt able to pull shorts over hips this session, min assist to reach back with LUE when pulling underwear over hips.  Engaged in education on donning shoes and  Lt shoe with AFO.  Pt reports poor fit of AFO and orthotist to return to secure AFO for improved fit.  Pt able to don both shoes this session with increased time when donning Lt shoe with AFO, encouraged pt to utilize AFO as a shoe horn to assist when getting heel positioned correctly.  Pt will continue to require practice with donning shoes once AFO secured.   Issued shoe buttons to assist with fastening shoes for appropriate tightness when wearing AFO.  Pt left seated EOB with bed alarm on and MD present.  Therapy Documentation Precautions:  Precautions Precautions: Fall Restrictions Weight Bearing Restrictions: No General:   Vital Signs: Therapy Vitals Temp: 97.9 F (36.6 C) Temp Source: Oral Pulse Rate: 60 Resp: 18 BP: 139/85 Patient Position (if appropriate): Lying Oxygen Therapy SpO2: 98 % O2 Device: Room Air Pain:  Pt with c/o pain in knees, RN applied medicated cream.  See Function Navigator for Current Functional Status.   Therapy/Group: Individual Therapy  Simonne Come 12/14/2017, 7:25 AM

## 2017-12-14 NOTE — Progress Notes (Signed)
[facility]IC[facility]ABILITATION     PROGRESS NOTE  Subjective/Complaints:    No further nightmares, slept ok  ROS: - CP on left , no pain with exertion, no abd pain no N/V/D  Objective: Vital Signs: Blood pressure 139/85, pulse 60, temperature 97.9 F (36.6 C), temperature source Oral, resp. rate 18, height 6\' 2"  (1.88 m), weight 132 kg (291 lb 1.6 oz), SpO2 98 %. No results found. No results for input(s): WBC, HGB, HCT, PLT in the last 72 hours. Recent Labs    12/12/17 0539 12/14/17 0641  NA 137  --   K 3.8  --   CL 105  --   GLUCOSE 118*  --   BUN 8  --   CREATININE 0.89 0.95  CALCIUM 8.7*  --    CBG (last 3)  Recent Labs    12/13/17 1636 12/13/17 2212 12/14/17 0656  GLUCAP 125* 150* 86    Wt Readings from Last 3 Encounters:  12/07/17 132 kg (291 lb 1.6 oz)  12/06/17 133.4 kg (294 lb)    Physical Exam:  BP 139/85 (BP Location: Right Arm)   Pulse 60   Temp 97.9 F (36.6 C) (Oral)   Resp 18   Ht 6\' 2"  (1.88 m)   Wt 132 kg (291 lb 1.6 oz)   SpO2 98%   BMI 37.38 kg/m  Constitutional: He appearswell-developed.No distress.obese HENT: Normocephalicand atraumatic.  Eyes:EOMare normal. No discharge. Cardiovascular:RRR. No JVD.Ecchymosis on left side ant ax line lower rib areaRespiratory:Effort normal. Clear. ZO:XWRU.EAVWUJWJXBJYGI:Soft.nondistended. Musculoskeletal: Left upper extremity tenderness.pain with L shoulder ROM.  Pain to plap over the left side lower ribs orse at ant axillary line, low back paraspinal tenderness    Neurological: He isalertand oriented. Dysarthria Left facial weakness Motor: LUE: 3-/5 proximal to distal (improved) LLE: 3-/5 proximal to distal, trace ankle on left Absent LT sensation LLE, intact LUE RUE/RLE 5/5 proximal to distal. Skin: Skin iswarmand dry.  Psychiatric: He has anormal mood and affect. Hisbehavior is normal.  Assessment/Plan: 1. 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 as well  as history of brain tumor which require 3+ hours per day of interdisciplinary therapy in a comprehensive inpatient rehab setting. Physiatrist is providing close team supervision and 24 hour management of active medical problems listed below. Physiatrist and rehab team continue to assess barriers to discharge/monitor patient progress toward functional and medical goals.  Function:  Bathing Bathing position   Position: Shower  Bathing parts Body parts bathed by patient: Right arm, Left arm, Abdomen, Chest, Front perineal area, Buttocks, Right upper leg, Left upper leg, Right lower leg, Left lower leg Body parts bathed by helper: Back  Bathing assist Assist Level: Touching or steadying assistance(Pt > 75%)      Upper Body Dressing/Undressing Upper body dressing   What is the patient wearing?: Pull over shirt/dress     Pull over shirt/dress - Perfomed by patient: Thread/unthread right sleeve, Put head through opening, Pull shirt over trunk, Thread/unthread left sleeve Pull over shirt/dress - Perfomed by helper: Thread/unthread left sleeve        Upper body assist Assist Level: Supervision or verbal cues, Set up   Set up : To obtain clothing/put away  Lower Body Dressing/Undressing Lower body dressing   What is the patient wearing?: Underwear, Pants, Socks Underwear - Performed by patient: Thread/unthread right underwear leg, Thread/unthread left underwear leg Underwear - Performed by helper: Pull underwear up/down Pants- Performed by patient: Thread/unthread right pants leg Pants- Performed  by helper: Thread/unthread left pants leg, Pull pants up/down   Non-skid slipper socks- Performed by helper: Don/doff right sock, Don/doff left sock Socks - Performed by patient: Don/doff right sock, Don/doff left sock   Shoes - Performed by patient: Don/doff right shoe Shoes - Performed by helper: Don/doff left shoe, Fasten right, Fasten left          Lower body assist Assist for lower  body dressing: Touching or steadying assistance (Pt > 75%)(Mod assist)      Toileting Toileting   Toileting steps completed by patient: Adjust clothing prior to toileting, Performs perineal hygiene Toileting steps completed by helper: Adjust clothing after toileting Toileting Assistive Devices: Grab bar or rail  Toileting assist Assist level: Touching or steadying assistance (Pt.75%)   Transfers Chair/bed transfer   Chair/bed transfer method: Stand pivot Chair/bed transfer assist level: Moderate assist (Pt 50 - 74%/lift or lower) Chair/bed transfer assistive device: Armrests     Locomotion Ambulation     Max distance: 75 Assist level: Touching or steadying assistance (Pt > 75%)   Wheelchair   Type: Manual Max wheelchair distance: 150 ft Assist Level: Supervision or verbal cues  Cognition Comprehension Comprehension assist level: Follows basic conversation/direction with extra time/assistive device  Expression Expression assist level: Expresses basic 75 - 89% of the time/requires cueing 10 - 24% of the time. Needs helper to occlude trach/needs to repeat words.  Social Interaction Social Interaction assist level: Interacts appropriately with others with medication or extra time (anti-anxiety, antidepressant).  Problem Solving Problem solving assist level: Solves complex 90% of the time/cues < 10% of the time  Memory Memory assist level: Recognizes or recalls 90% of the time/requires cueing < 10% of the time    Medical Problem List and Plan: 1.Left side weakness with dysarthriasecondary to right MCA territory infarction as well as history of brain tumor many years ago with resection at [facility] CIR, PT, OT, SLP   2. DVT Prophylaxis/Anticoagulation: Subcutaneous Lovenox. Monitor for any bleeding episodes 3. Pain Management:Neurontin 100 mg 3 times daily, Ultram as needed 4. Mood:Prozac 20 mg daily, reduce Seroquel to  12.5 mg twice daily as needed 5.  Neuropsych: This patientiscapable of making decisions on hisown behalf. 6. Skin/Wound Care:Routine skin checks 7. Fluids/Electrolytes/Nutrition:Routine in and outs 8.Asymptomatic carotid stenosis. Patient followed vascular surgery [facility] Dr.Velasquez Derrell Lolling. 9.Hypertension. Monitor with increased mobility. Patient on Norvasc 10 mg daily, Zestoretic 20-12.5 mg daily prior to admission. Resume as needed   Vitals:   12/13/17 1957 12/14/17 0526  BP:  139/85  Pulse:  60  Resp:  18  Temp:  97.9 F (36.6 C)  SpO2: 97% 98%   10.Diabetes mellitus. Hemoglobin A1c 11.4.  -NovoLog 10 units 3 times daily, Lantus insulin 45 units daily. -Check blood sugars before meals and at bedtime. Diabetic teaching   CBG (last 3)  Recent Labs    12/13/17 1636 12/13/17 2212 12/14/17 0656  GLUCAP 125* 150* 86  good in hospital control 7/5 11.Tobacco abuse. NicoDerm patch. Counseling 12.Hyperlipidemia. Lipitor/fenofibrate 13.Medical noncompliance. Counseling 14.Morbid Obesity. BMI 37.38. Dietary follow-up  16. Hypomagnesemia  Improved to 1.7 7/3  17. Thrombocytopenia  Platelets 138 on 6/28, stable at 131K on 7/1   Continue to monitor- no sign of hemorrhage 18.  Loose stool  LOS (Days) 7 A FACE TO FACE EVALUATION WAS PERFORMED  Erick Colace 12/14/2017 8:48 AM

## 2017-12-14 NOTE — Progress Notes (Signed)
Physical Therapy Session Note  Patient Details  Name: Zachary Deleon MRN: [id number] Date of Birth: Sep 26, 1965  Today's Date: 12/14/2017 PT Individual Time: [id number] and 1516-1559 PT Individual Time Calculation (min): 56 min and 43 min  Short Term Goals: Week 1:  PT Short Term Goal 1 (Week 1): pt will perform bed<>WC transfers with min assist consistently  PT Short Term Goal 2 (Week 1): Pt will propell WC 16300ft with supervision assist  PT Short Term Goal 3 (Week 1): Pt will ambulate 4430ft with mod assist and LRAD  PT Short Term Goal 4 (Week 1): Pt will perform bed mobiltiy with min assist with sit<>supine and minimal use of bed rails.  Week 2:     Skilled Therapeutic Interventions/Progress Updates:  Treatment 1: Pt received in bed & agreeable to tx. Pt reports HA at beginning of session & after negotiating stairs - pain meds requested. Pt transfers to EOB with supervision and requires min assist to attach laces of L shoe to shoe button and max cuing to maintain AFO as far back in shoe as possible otherwise posterior portion will touch his heel and cause pain (awaiting Zachary Deleon to secure AFO in pt's shoe). Pt completes stand pivot bed<>w/c with steady assist beginning to fade to close supervision. Pt propels w/c room>gym with R hemi technique but intermittent use of LUE. Utilize mirror for visual feedback 2/2 pt's R lateral lean and addressed equal weight bearing through BLE as pt initially weight bearing more through RLE. Gait x 100 ft + 100 ft with RW and min assist with pt intermittently kicking RW with LLE and requiring cuing for forward vs downward gaze with poor return demo. Pt with less L knee hyperextension with use of L AFO. Pt negotiates 4 steps (6") with B rails and min assist with heavy reliance on BUE & RLE when descending stairs. Pt performed lateral step ups on 6" step with BUE support and min assist for balance with task focusing on LLE strengthening & NMR. At end of session pt returned  to room and returned to bed. Pt doffed shoes & L AFO without assist. Pt left in bed with alarm set & all needs within reach. BP after negotiating stairs = 154/86 mmHg (LUE), HR = 70 bpm.   Pt with no c/o pain caused by AFO when positioned posteriorly in shoe.   Treatment 2: Pt reports pain in LUE but notes being premedicated. Pt received in bathroom, continent BM & performed peri hygiene without assistance. Pt requires assistance to pull pants over L hips and steady assist for standing balance. Pt returns to chair via stand pivot with steady assist and performs hand hygiene at sink from w/c level. Pt dons shoes with assistance only for L shoe and laces. Transported pt to dayroom w/c total assist for time management. Pt ambulates w/c<>kinetron and w/c<>nu-step with RW, L AFO, and L Hand orthosis with steady assist with improvement in gait quality 2/2 decreased overall speed 2/2 fatigue. Pt utilized kinetron in sitting progressing to standing with BUE support and min assist with task focusing on BLE strengthening, L NMR, weight shifting, and dynamic balance; pt required frequent rest breaks 2/2 fatigue. Pt attempted to use nu-step on level 3, reducing to level 1, 2/2 L knee pain however pt unable to perform task due to pain. Pt propels w/c back towards room with R hemi technique but continues to report "I feel bad. I feel foggy." but unable to elaborate. Pt returned to bed via stand  pivot with steady assist and to supine with mod I & HOB elevated. Pt left in bed with alarm set & in care of NT.    Therapy Documentation Precautions:  Precautions Precautions: Fall Restrictions Weight Bearing Restrictions: No    See Function Navigator for Current Functional Status.   Therapy/Group: Individual Therapy  Sandi Mariscal 12/14/2017, 4:08 PM

## 2017-12-15 ENCOUNTER — Inpatient Hospital Stay (HOSPITAL_COMMUNITY): Payer: Medicare Other | Admitting: Speech Pathology

## 2017-12-15 ENCOUNTER — Inpatient Hospital Stay (HOSPITAL_COMMUNITY): Payer: Medicare Other

## 2017-12-15 LAB — GLUCOSE, CAPILLARY
GLUCOSE-CAPILLARY: 96 mg/dL (ref 70–99)
GLUCOSE-CAPILLARY: 90 mg/dL (ref 70–99)
Glucose-Capillary: 133 mg/dL — ABNORMAL HIGH (ref 70–99)
Glucose-Capillary: 95 mg/dL (ref 70–99)

## 2017-12-15 MED ORDER — LOPERAMIDE HCL 2 MG PO CAPS: 2.0000 mg | ORAL_CAPSULE | Freq: Three times a day (TID) | ORAL | Status: DC | PRN

## 2017-12-15 NOTE — Progress Notes (Signed)
Occupational Therapy Session Note  Patient Details  Name: Zachary Deleon MRN: [id number] Date of Birth: 1965/11/11  Today's Date: 12/15/2017 OT Individual Time: [id number] OT Individual Time Calculation (min): 27 min   Skilled Therapeutic Interventions/Progress Updates:    1;1. Pt with no c/o pain. Pt requesting to re-kinesiotape L shoulder as tape is falling off. OT tapes along ant/post delt and trapezuis muscle. Pt educated on TTB transfers and OT demonstrates proper technique. Pt completes transfer with VC for keeping walker slose to body during sit to stand as well as reaching back prior to sitting on bench. Pt uses BLE to propel w/c part way to/from destinations to improve reciprocal movement. Exited session with pt seated in recliner, chair alarm on and call light in reach.  Therapy Documentation Precautions:  Precautions Precautions: Fall Restrictions Weight Bearing Restrictions: No Pain: Pain Assessment Pain Scale: 0-10 Pain Score: 0-No pain Pain Type: Chronic pain Pain Location: Head Pain Orientation: Anterior;Other (Comment)(pt stated "behind my eyes") Pain Descriptors / Indicators: Aching;Headache Pain Frequency: Intermittent Pain Onset: On-going Patients Stated Pain Goal: 4 Pain Intervention(s): Medication (See eMAR)(Tramadol and tylenol)  See Function Navigator for Current Functional Status.   Therapy/Group: Individual Therapy  Shon HaleStephanie M Majour Frei 12/15/2017, 1:31 PM

## 2017-12-15 NOTE — Plan of Care (Signed)
  Problem: Consults Goal: RH STROKE PATIENT EDUCATION Description See Patient Education module for education specifics  Outcome: Progressing Goal: Diabetes Guidelines if Diabetic/Glucose > 140 Description If diabetic or lab glucose is > 140 mg/dl - Initiate Diabetes/Hyperglycemia Guidelines & Document Interventions  Outcome: Progressing   Problem: RH BLADDER ELIMINATION Goal: RH STG MANAGE BLADDER WITH ASSISTANCE Description STG Manage Bladder With min Assistance  Outcome: Progressing   Problem: RH SAFETY Goal: RH STG ADHERE TO SAFETY PRECAUTIONS W/ASSISTANCE/DEVICE Description STG Adhere to Safety Precautions With supervision Assistance/Device.  Outcome: Progressing   Problem: RH PAIN MANAGEMENT Goal: RH STG PAIN MANAGED AT OR BELOW PT'S PAIN GOAL Description < 5  Outcome: Progressing   Problem: RH KNOWLEDGE DEFICIT Goal: RH STG INCREASE KNOWLEDGE OF DIABETES Description Min assist  Outcome: Progressing Goal: RH STG INCREASE KNOWLEDGE OF HYPERTENSION Description Min assist.  Outcome: Progressing

## 2017-12-15 NOTE — Progress Notes (Signed)
Zachary Deleon is a 52 y.o. male [birth year] [id number]  Subjective: No new complaints. No new problems. Slept well. Feeling OK.  Objective: Vital signs in last 24 hours: Temp:  [97.6 F (36.4 C)-99.6 F (37.6 C)] 97.6 F (36.4 C) (07/06 0317) Pulse Rate:  [58-72] 63 (07/06 0317) Resp:  [18-20] 20 (07/06 0317) BP: (139-184)/(67-88) 156/75 (07/06 0317) SpO2:  [92 %-99 %] 97 % (07/06 0317) Weight change:  Last BM Date: 12/14/17  Intake/Output from previous day: 07/05 0701 - 07/06 0700 In: 600 [P.O.:600] Out: -  Last cbgs: CBG (last 3)  Recent Labs    12/14/17 2104 12/15/17 0649 12/15/17 1200  GLUCAP 124* 133* 96     Physical Exam General: No apparent distress.  Obese HEENT: not dry Lungs: Normal effort. Lungs clear to auscultation, no crackles or wheezes. Cardiovascular: Regular rate and rhythm, no edema Abdomen: S/NT/ND; BS(+) Musculoskeletal:  unchanged Neurological: No new neurological deficits Wounds: N/A    Skin: clear   Mental state: Alert, oriented, cooperative.  He is talkative.    Lab Results: BMET    Component Value Date/Time   NA 137 12/12/2017 0539   K 3.8 12/12/2017 0539   CL 105 12/12/2017 0539   CO2 26 12/12/2017 0539   GLUCOSE 118 (H) 12/12/2017 0539   BUN 8 12/12/2017 0539   CREATININE 0.95 12/14/2017 0641   CALCIUM 8.7 (L) 12/12/2017 0539   GFRNONAA >60 12/14/2017 0641   GFRAA >60 12/14/2017 0641   CBC    Component Value Date/Time   WBC 7.8 12/10/2017 0637   RBC 5.18 12/10/2017 0637   HGB 15.0 12/10/2017 0637   HCT 46.0 12/10/2017 0637   PLT 131 (L) 12/10/2017 0637   MCV 88.8 12/10/2017 0637   MCH 29.0 12/10/2017 0637   MCHC 32.6 12/10/2017 0637   RDW 13.5 12/10/2017 0637   LYMPHSABS 2.5 12/10/2017 0637   MONOABS 0.7 12/10/2017 0637   EOSABS 0.1 12/10/2017 0637   BASOSABS 0.1 12/10/2017 0637    Studies/Results: No results found.  Medications: I have reviewed the patient's current  medications.  Assessment/Plan:   -NovoLog 10 units 3 times daily, Lantus insulin 45 units daily.   1.  Left-sided weakness with dysarthria secondary to right MCA infarction.  History of brain [facility] many years ago [facility].  Continue CIR 2.  DVT prophylaxis with Lovenox 3.  Pain management with gabapentin 4.  Depression.  On Prozac.  Seroquel is being reduced 5.  Asymptomatic carotid stenosis.  [facility] 6.  Hypertension.  He was on Norvasc and Zestoretic at home 7.  Type 2 diabetes.  On Lantus and NovoLog 8.  Tobacco abuse.  He was counseled 9.  Thrombocytopenia.  Monitoring CBC 10.  Loose stools.  Will use Imodium 11.  History of noncompliance.  It was discussed prior 4412.  Morbid obesity.  On diet.       Length of stay, days: 8  Sonda PrimesAlex Joandry Slagter , MD 12/15/2017, 1:04 PM

## 2017-12-15 NOTE — Progress Notes (Signed)
Pt refusing CPAP

## 2017-12-16 ENCOUNTER — Inpatient Hospital Stay (HOSPITAL_COMMUNITY): Payer: Medicare Other | Admitting: Occupational Therapy

## 2017-12-16 ENCOUNTER — Inpatient Hospital Stay (HOSPITAL_COMMUNITY): Payer: Medicare Other | Admitting: Physical Therapy

## 2017-12-16 LAB — GLUCOSE, CAPILLARY
GLUCOSE-CAPILLARY: 140 mg/dL — AB (ref 70–99)
GLUCOSE-CAPILLARY: 115 mg/dL — AB (ref 70–99)
Glucose-Capillary: 120 mg/dL — ABNORMAL HIGH (ref 70–99)
Glucose-Capillary: 136 mg/dL — ABNORMAL HIGH (ref 70–99)

## 2017-12-16 NOTE — Progress Notes (Signed)
PRN melatonin given at 2035. PRN ultram at 2327, for C/O HA. Reports slept good last night. Zachary Deleon, Zachary Deleon

## 2017-12-16 NOTE — Progress Notes (Signed)
Lucianne Leilton J Pigman is a 52 y.o. male May 01, 1966 [id number]  Subjective: No new complaints. No new problems. Slept well.   Objective: Vital signs in last 24 hours: Temp:  [97.9 F (36.6 C)-98.2 F (36.8 C)] 98.2 F (36.8 C) (07/07 0433) Pulse Rate:  [60-68] 60 (07/07 0433) Resp:  [18] 18 (07/07 0433) BP: (138-166)/(67-86) 146/78 (07/07 0433) SpO2:  [95 %-96 %] 96 % (07/07 0433) Weight change:  Last BM Date: 12/15/17  Intake/Output from previous day: 07/06 0701 - 07/07 0700 In: 840 [P.O.:840] Out: -  Last cbgs: CBG (last 3)  Recent Labs    12/15/17 1708 12/15/17 2129 12/16/17 0633  GLUCAP 95 90 140*     Physical Exam General: No apparent distress.  Very social and pleasant HEENT: not dry Lungs: Normal effort. Lungs clear to auscultation, no crackles or wheezes. Cardiovascular: Regular rate and rhythm, no edema Abdomen: S/NT/ND; BS(+) Musculoskeletal:  unchanged Neurological: No new neurological deficits Wounds: N/A    Skin: clear  Aging changes Mental state: Alert, oriented, cooperative    Lab Results: BMET    Component Value Date/Time   NA 137 12/12/2017 0539   K 3.8 12/12/2017 0539   CL 105 12/12/2017 0539   CO2 26 12/12/2017 0539   GLUCOSE 118 (H) 12/12/2017 0539   BUN 8 12/12/2017 0539   CREATININE 0.95 12/14/2017 0641   CALCIUM 8.7 (L) 12/12/2017 0539   GFRNONAA >60 12/14/2017 0641   GFRAA >60 12/14/2017 0641   CBC    Component Value Date/Time   WBC 7.8 12/10/2017 0637   RBC 5.18 12/10/2017 0637   HGB 15.0 12/10/2017 0637   HCT 46.0 12/10/2017 0637   PLT 131 (L) 12/10/2017 0637   MCV 88.8 12/10/2017 0637   MCH 29.0 12/10/2017 0637   MCHC 32.6 12/10/2017 0637   RDW 13.5 12/10/2017 0637   LYMPHSABS 2.5 12/10/2017 0637   MONOABS 0.7 12/10/2017 0637   EOSABS 0.1 12/10/2017 0637   BASOSABS 0.1 12/10/2017 0637    Studies/Results: No results found.  Medications: I have reviewed the patient's current medications.  Assessment/Plan:    1.   Left-sided hemiparesis with dysarthria secondary to right MCA infarction.  History of brain [facility] many [facility].  CIR 2.  DVT prophylaxis-Lovenox 3.  Pain management with gabapentin 4.  Depression.  On Prozac.  Seroquel is being reduced 5.  Asymptomatic carotid stenosis.  [facility] 6.  Hypertension.  She used to take Norvasc and Zestoretic at home 7.  Type 2 diabetes.  On Lantus and NovoLog  8.  Tobacco abuse.  Status post counseling 9.  Thrombocytopenia.  We will continue to monitor CBC 10.  Loose stools.  Will start Imodium today 11.  History of medical noncompliance.  Status post counseling 12.  Morbid obesity.  He is currently on hospital diet        Length of stay, days: 9  Sonda PrimesAlex Suhas Estis , MD 12/16/2017, 11:34 AM

## 2017-12-16 NOTE — Progress Notes (Signed)
Physical Therapy Session Note  Patient Details  Name: Zachary Deleon MRN: [id number] Date of Birth: July 23, 1965  Today's Date: 12/16/2017 PT Individual Time: 1100-1200 PT Individual Time Calculation (min): 60 min   Short Term Goals: Week 1:  PT Short Term Goal 1 (Week 1): pt will perform bed<>WC transfers with min assist consistently  PT Short Term Goal 1 - Progress (Week 1): Met PT Short Term Goal 2 (Week 1): Pt will propell WC 110f with supervision assist  PT Short Term Goal 2 - Progress (Week 1): Met PT Short Term Goal 3 (Week 1): Pt will ambulate 337fwith mod assist and LRAD  PT Short Term Goal 3 - Progress (Week 1): Met PT Short Term Goal 4 (Week 1): Pt will perform bed mobiltiy with min assist with sit<>supine and minimal use of bed rails.  PT Short Term Goal 4 - Progress (Week 1): Met  Skilled Therapeutic Interventions/Progress Updates:  Pt was seen bedside in the am. Pt transferred supine to edge of bed with siderail and no assist. Pt utilizes L AFO and L hand orthosis. Pt transferred sit to stand with rolling walker and min A and verbal cues. Pt transferred stand pivot with rolling walker and min A with verbal cues. Pt propelled w/c 150 feet x 2 with B LEs and R UE with S and increased time with cues. Treatment on gym focused on NMR utilizing step taps and alternating step taps, 3 sets x 10 reps each. Pt returned to room following treatment and left sitting on edge of bed with bed alarm on.   Therapy Documentation Precautions:  Precautions Precautions: Fall Restrictions Weight Bearing Restrictions: No General:   Vital Signs:  Pain: Pt c/o 3/10 pain L shoulder and headache medicated prior to treatment.   See Function Navigator for Current Functional Status.   Therapy/Group: Individual Therapy  MiDub Amis/12/2017, 12:54 PM

## 2017-12-16 NOTE — Plan of Care (Signed)
  Problem: Consults Goal: RH STROKE PATIENT EDUCATION Description See Patient Education module for education specifics  Outcome: Progressing Goal: Diabetes Guidelines if Diabetic/Glucose > 140 Description If diabetic or lab glucose is > 140 mg/dl - Initiate Diabetes/Hyperglycemia Guidelines & Document Interventions  Outcome: Progressing   Problem: RH BLADDER ELIMINATION Goal: RH STG MANAGE BLADDER WITH ASSISTANCE Description STG Manage Bladder With min Assistance  Outcome: Progressing   Problem: RH SAFETY Goal: RH STG ADHERE TO SAFETY PRECAUTIONS W/ASSISTANCE/DEVICE Description STG Adhere to Safety Precautions With supervision Assistance/Device.  Outcome: Progressing   Problem: RH PAIN MANAGEMENT Goal: RH STG PAIN MANAGED AT OR BELOW PT'S PAIN GOAL Description < 5  Outcome: Progressing   Problem: RH KNOWLEDGE DEFICIT Goal: RH STG INCREASE KNOWLEDGE OF DIABETES Description Min assist  Outcome: Progressing Goal: RH STG INCREASE KNOWLEDGE OF HYPERTENSION Description Min assist.  Outcome: Progressing   

## 2017-12-17 ENCOUNTER — Inpatient Hospital Stay (HOSPITAL_COMMUNITY): Payer: Medicare Other | Admitting: Physical Therapy

## 2017-12-17 ENCOUNTER — Inpatient Hospital Stay (HOSPITAL_COMMUNITY): Payer: Medicare Other

## 2017-12-17 ENCOUNTER — Inpatient Hospital Stay (HOSPITAL_COMMUNITY): Payer: Medicare Other | Admitting: Occupational Therapy

## 2017-12-17 LAB — GLUCOSE, CAPILLARY
GLUCOSE-CAPILLARY: 126 mg/dL — AB (ref 70–99)
Glucose-Capillary: 125 mg/dL — ABNORMAL HIGH (ref 70–99)
Glucose-Capillary: 104 mg/dL — ABNORMAL HIGH (ref 70–99)
Glucose-Capillary: 124 mg/dL — ABNORMAL HIGH (ref 70–99)

## 2017-12-17 MED ORDER — AMLODIPINE BESYLATE 5 MG PO TABS
5.0000 mg | ORAL_TABLET | Freq: Every day | ORAL | Status: DC
  Administered 2017-12-17 – 2017-12-19 (×3): 5 mg via ORAL
  Filled 2017-12-17 (×3): qty 1

## 2017-12-17 MED ORDER — TOPIRAMATE 25 MG PO TABS
25.0000 mg | ORAL_TABLET | Freq: Every day | ORAL | Status: DC
  Administered 2017-12-17 – 2017-12-18 (×2): 25 mg via ORAL
  Filled 2017-12-17 (×2): qty 1

## 2017-12-17 NOTE — Progress Notes (Signed)
Speech Language Pathology Daily Session Note  Patient Details  Name: Zachary Deleon MRN: [id number] Date of Birth: [birth year]  Today's Date: 12/17/2017 SLP Individual Time: [id number] SLP Individual Time Calculation (min): 37 min  Short Term Goals: Week 2: SLP Short Term Goal 1 (Week 2): Pt will utilize speech intelligibility strategies to achieve 90%-100% intelligibility at the simple conversation level with supervision cues. SLP Short Term Goal 2 (Week 2): Pt will selectively attend to task in a moderately distracting environment for up to 30 minutes with Mod I. SLP Short Term Goal 3 (Week 2): Pt will complete semi-complex reasoning tasks with supervision cues.  SLP Short Term Goal 4 (Week 2): Pt will demonstrate anticipatory awareness by listing 3 activities that are safe and not safe to perform within the home environment with supervision cues.   Skilled Therapeutic Interventions:Skilled ST services focused on cognitive skills. SLP facilitated semi-complex problem solving skills utilizing medication management task, pt was unable to initial recall completing medication task in piror session, although pt demonstrated ability to recall medication name, dosage and function with use of external aid (medication list) and demonstrated problem solving skills with question prompts about medication scenarios with supervision A verbal cues. Pt states that his wife manages most of the medication, however he will refill his pill organizer on occasion. SLP provided continued education of the importance of continuing medication, pt stated understanding and believed that smoking and disconinuing of medication result in CVA. SLP facilitated anticipatory awareness skills given leading questions about unsafe/safe activities at home, pt required supervision A verbal/question cues. SLP assisted pt into bed. Pt was left in room with call bell within reach and bed alarm set. Recommend to continue skilled ST services.     Function:  Eating Eating   Modified Consistency Diet: No Eating Assist Level: No help, No cues           Cognition Comprehension Comprehension assist level: Follows basic conversation/direction with extra time/assistive device  Expression   Expression assist level: Expresses basic needs/ideas: With extra time/assistive device  Social Interaction Social Interaction assist level: Interacts appropriately with others with medication or extra time (anti-anxiety, antidepressant).  Problem Solving Problem solving assist level: Solves basic problems with no assist;Solves basic 90% of the time/requires cueing < 10% of the time  Memory Memory assist level: Recognizes or recalls 90% of the time/requires cueing < 10% of the time    Pain Pain Assessment Pain Scale: 0-10 Pain Score: 2  Pain Type: Acute pain Pain Location: Head Pain Orientation: Anterior Pain Descriptors / Indicators: Headache Pain Frequency: Intermittent Pain Onset: On-going Patients Stated Pain Goal: 0 Pain Intervention(s): Medication (See eMAR)  Therapy/Group: Individual Therapy  Umaiza Matusik  [facility] 12/17/2017, 2:59 PM

## 2017-12-17 NOTE — Plan of Care (Signed)
  Problem: Consults Goal: RH STROKE PATIENT EDUCATION Description See Patient Education module for education specifics  Outcome: Progressing Goal: Diabetes Guidelines if Diabetic/Glucose > 140 Description If diabetic or lab glucose is > 140 mg/dl - Initiate Diabetes/Hyperglycemia Guidelines & Document Interventions  Outcome: Progressing   Problem: RH BLADDER ELIMINATION Goal: RH STG MANAGE BLADDER WITH ASSISTANCE Description STG Manage Bladder With min Assistance  Outcome: Progressing   Problem: RH SAFETY Goal: RH STG ADHERE TO SAFETY PRECAUTIONS W/ASSISTANCE/DEVICE Description STG Adhere to Safety Precautions With supervision Assistance/Device.  Outcome: Progressing   Problem: RH PAIN MANAGEMENT Goal: RH STG PAIN MANAGED AT OR BELOW PT'S PAIN GOAL Description < 5  Outcome: Progressing   Problem: RH KNOWLEDGE DEFICIT Goal: RH STG INCREASE KNOWLEDGE OF DIABETES Description Min assist  Outcome: Progressing Goal: RH STG INCREASE KNOWLEDGE OF HYPERTENSION Description Min assist.  Outcome: Progressing   

## 2017-12-17 NOTE — Progress Notes (Signed)
Pt refused CPAP for the night. RT will continue to monitor as needed.  

## 2017-12-17 NOTE — Progress Notes (Signed)
Patient refused CPAP tonight. There isn't a machine in the room at this time.

## 2017-12-17 NOTE — Progress Notes (Signed)
[facility] PHYSICAL MEDICINE & REHABILITATION     PROGRESS NOTE  Subjective/Complaints:   No issues overnite except headache since CVA.  Has had HA since brain tumor.  Sometime relieved with tylenol , has not tried preventive medication  ROS: - CP on left , no pain with exertion, no abd pain no N/V/D  Objective: Vital Signs: Blood pressure (!) 145/84, pulse (!) 58, temperature 97.7 F (36.5 C), temperature source Oral, resp. rate 17, height 6\' 2"  (1.88 m), weight 132 kg (291 lb 1.6 oz), SpO2 96 %. No results found. No results for input(s): WBC, HGB, HCT, PLT in the last 72 hours. No results for input(s): NA, K, CL, GLUCOSE, BUN, CREATININE, CALCIUM in the last 72 hours.  Invalid input(s): CO CBG (last 3)  Recent Labs    12/16/17 1647 12/16/17 2106 12/17/17 0611  GLUCAP 136* 115* 126*    Wt Readings from Last 3 Encounters:  12/07/17 132 kg (291 lb 1.6 oz)  12/06/17 133.4 kg (294 lb)    Physical Exam:  BP (!) 145/84 (BP Location: Right Arm)   Pulse (!) 58   Temp 97.7 F (36.5 C) (Oral)   Resp 17   Ht 6\' 2"  (1.88 m)   Wt 132 kg (291 lb 1.6 oz)   SpO2 96%   BMI 37.38 kg/m  Constitutional: He appearswell-developed.No distress.obese HENT: Normocephalicand atraumatic.  Eyes:EOMare normal. No discharge. Cardiovascular:RRR. No JVD.Ecchymosis on left side ant ax line lower rib areaRespiratory:Effort normal. Clear. ZO:XWRU.EAVWUJWJXBJYGI:Soft.nondistended. Musculoskeletal: Left upper extremity tenderness.pain with L shoulder ROM.  Pain to plap over the left side lower ribs orse at ant axillary line, low back paraspinal tenderness    Neurological: He isalertand oriented. Dysarthria Left facial weakness Motor: LUE: 4-/5 proximal to distal (improved) LLE: 4-/5 proximal to distal, trace ankle on left Absent LT sensation LLE,reduced LUE RUE/RLE 5/5 proximal to distal. Skin: Skin iswarmand dry.  Psychiatric: He has anormal mood and affect. Hisbehavior is  normal.  Assessment/Plan: 1. 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 as well as history of brain tumor which require 3+ hours per day of interdisciplinary therapy in a comprehensive inpatient rehab setting. Physiatrist is providing close team supervision and 24 hour management of active medical problems listed below. Physiatrist and rehab team continue to assess barriers to discharge/monitor patient progress toward functional and medical goals.  Function:  Bathing Bathing position   Position: Shower  Bathing parts Body parts bathed by patient: Right arm, Left arm, Abdomen, Chest, Front perineal area, Buttocks, Right upper leg, Left upper leg, Right lower leg, Left lower leg Body parts bathed by helper: Back  Bathing assist Assist Level: Touching or steadying assistance(Pt > 75%)      Upper Body Dressing/Undressing Upper body dressing   What is the patient wearing?: Pull over shirt/dress     Pull over shirt/dress - Perfomed by patient: Thread/unthread right sleeve, Put head through opening, Pull shirt over trunk, Thread/unthread left sleeve Pull over shirt/dress - Perfomed by helper: Thread/unthread left sleeve        Upper body assist Assist Level: Supervision or verbal cues, Set up   Set up : To obtain clothing/put away  Lower Body Dressing/Undressing Lower body dressing   What is the patient wearing?: Pants, Socks, Shoes, AFO Underwear - Performed by patient: Thread/unthread right underwear leg, Thread/unthread left underwear leg Underwear - Performed by helper: Pull underwear up/down Pants- Performed by patient: Thread/unthread right pants leg, Thread/unthread left pants leg, Pull pants up/down Pants- Performed by  helper: Thread/unthread left pants leg, Pull pants up/down   Non-skid slipper socks- Performed by helper: Don/doff right sock, Don/doff left sock Socks - Performed by patient: Don/doff right sock, Don/doff left sock   Shoes - Performed by  patient: Don/doff right shoe, Don/doff left shoe Shoes - Performed by helper: Fasten right, Fasten left AFO - Performed by patient: Don/doff left AFO AFO - Performed by helper: Don/doff left AFO      Lower body assist Assist for lower body dressing: Touching or steadying assistance (Pt > 75%)      Toileting Toileting   Toileting steps completed by patient: Adjust clothing prior to toileting, Performs perineal hygiene Toileting steps completed by helper: Adjust clothing after toileting Toileting Assistive Devices: Grab bar or rail  Toileting assist Assist level: Touching or steadying assistance (Pt.75%)   Transfers Chair/bed transfer   Chair/bed transfer method: Stand pivot, Squat pivot Chair/bed transfer assist level: Touching or steadying assistance (Pt > 75%) Chair/bed transfer assistive device: Armrests, Patent attorney     Max distance: 10 ft  Assist level: Touching or steadying assistance (Pt > 75%)   Wheelchair   Type: Manual Max wheelchair distance: 150 Assist Level: Supervision or verbal cues  Cognition Comprehension Comprehension assist level: Follows basic conversation/direction with extra time/assistive device  Expression Expression assist level: Expresses basic needs/ideas: With extra time/assistive device  Social Interaction Social Interaction assist level: Interacts appropriately with others with medication or extra time (anti-anxiety, antidepressant).  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 CIR, PT, OT, SLP - discussed time course of recovery from sensory def  2. DVT  Prophylaxis/Anticoagulation: Subcutaneous Lovenox. Monitor for any bleeding episodes 3. Pain Management:Neurontin 100 mg 3 times daily, Ultram as needed, post CVA HA trial topiramate 4. Mood:Prozac 20 mg daily, reduce Seroquel to  12.5 mg twice daily as needed 5. Neuropsych: This patientiscapable of making decisions on hisown behalf. 6. Skin/Wound Care:Routine skin checks 7. Fluids/Electrolytes/Nutrition:Routine in and outs 8.Asymptomatic carotid stenosis. Patient followed vascular surgery [facility] Dr.Velasquez Derrell Lolling. 9.Hypertension. Monitor with increased mobility. Patient on Norvasc 10 mg daily, Zestoretic 20-12.5 mg daily prior to admission. Resume as needed   Vitals:   12/16/17 2012 12/17/17 0421  BP: (!) 162/87 (!) 145/84  Pulse: 64 (!) 58  Resp:  17  Temp:  97.7 F (36.5 C)  SpO2:  96%  systolic BP labile , avapro started, this was not home med, will switch to amlodipine 10.Diabetes mellitus. Hemoglobin A1c 11.4.  -NovoLog 10 units 3 times daily, Lantus insulin 45 units daily. -Check blood sugars before meals and at bedtime. Diabetic teaching   CBG (last 3)  Recent Labs    12/16/17 1647 12/16/17 2106 12/17/17 0611  GLUCAP 136* 115* 126*  good in hospital control 7/8 11.Tobacco abuse. NicoDerm patch. Counseling 12.Hyperlipidemia. Lipitor/fenofibrate 13.Medical noncompliance. Counseling 14.Morbid Obesity. BMI 37.38. Dietary follow-up  16. Hypomagnesemia  Improved to 1.7 7/3  17. Thrombocytopenia  Platelets 138 on 6/28, stable at 131K on 7/1   Continue to monitor- no sign of hemorrhage 18.  Loose stool  LOS (Days) 10 A FACE TO  FACE EVALUATION WAS PERFORMED  Erick Colace 12/17/2017 8:38 AM

## 2017-12-17 NOTE — Progress Notes (Signed)
Occupational Therapy Session Note  Patient Details  Name: Zachary Deleon MRN: [id number] Date of Birth: February 26, 1966  Today's Date: 12/17/2017 OT Individual Time: [id number] OT Individual Time Calculation (min): 86 min    Short Term Goals: Week 2:  OT Short Term Goal 1 (Week 2): Pt will complete LB dressing with min assist OT Short Term Goal 2 (Week 2): Pt will don shoes with AFO with min assist OT Short Term Goal 3 (Week 2): Pt will complete 3/3 toileting tasks with min assist for standing balance OT Short Term Goal 4 (Week 2): Pt will complete toilet transfers with supervision with LRAD  Skilled Therapeutic Interventions/Progress Updates:    Pt with no c/o pain this session and agreeable to OT intervention. Pt performed supine >sit with supervision and standing from bed with min A for safety. Pt placing L UE into orthosis and ambulating to bathroom for bathing at shower level with overall steady assistance for balance. Pt seated on TTB with min A and use of RW and grab bar. Pt remained seated and showed good safety awareness by notifying therapist when he would like to stand for balance assistance. Pt standing with min A for balance while he washed buttocks and peri area. Pt returned to seated position for drying and then returning to EOB to don clothing. Pt required min verbal cuing for hemiplegic dressing technique to dress self on L side first.  Pt utilized figure four position on EOB to don B socks this session with supervision. Pt remained seated on EOB [facility] tasks. OT introduced red, medium soft theraputty with demonstrations as well as paper handout for HEP. Pt returning demonstrations with L UE only with increased time and min verbal cues for proper technique. Pt needing multiple rest breaks secondary to hand fatigue but very motivated. Pt returning to supine at end of session with bed alarm activated and call bell within reach.   Therapy Documentation Precautions:   Precautions Precautions: Fall Restrictions Weight Bearing Restrictions: No General:   Vital Signs: Therapy Vitals Pulse Rate: 67 Resp: 18 BP: (!) 160/102 Patient Position (if appropriate): Sitting Oxygen Therapy SpO2: 99 % O2 Device: Room Air Pain: Pain Assessment Pain Scale: 0-10 Pain Score: 0-No pain  See Function Navigator for Current Functional Status.   Therapy/Group: Individual Therapy  Alen BleacherBradsher, Demetri Goshert P 12/17/2017, 11:17 AM

## 2017-12-17 NOTE — Progress Notes (Addendum)
Physical Therapy Session Note  Patient Details  Name: Lucianne Leilton J Aslinger MRN: [id number] Date of Birth: 1966/05/19  Today's Date: 12/17/2017 PT Individual Time: 7829-56211302-1415 PT Individual Time Calculation (min): 73 min   Short Term Goals: Week 2:  PT Short Term Goal 1 (Week 2): STG = LTG due to estimated d/c date.  Skilled Therapeutic Interventions/Progress Updates:  Pt received sitting on EOB, no c/o pain reported but pt reporting need to use restroom. Pt completed stand pivot transfers with close supervision bed<>w/c and w/c<>toilet with use of grab bar. Pt with continent BM on toilet and performed peri hygiene and hand hygiene without assistance. While sitting EOB pt donned B socks, shoes, L AFO and shirt with only assistance to position calf portion of L AFO. In dayroom pt ambulates 60 ft x 3 with RW, L AFO and L hand orthosis with steady assist. Pt attempted gait without L hand orthosis but with hand slipping off RW. Therapist placed foam padding on hand orthosis to alleviate pain as pt reports his hand pushes into edge of device and pt reporting improving comfort with modification. Educated pt on supervision level goals, need to decrease gait speed and external distractions (stop and stand still or sit if wishing to talk as pt with decreased balance while simultaneously conversing & walking) and need to alternate gaze between forward & at feet 2/2 decreased LUE/LLE proprioception. Pt then ambulates 170 ft x 2, with supervision for initial trial but steady assist for 2nd 2/2 fatigue. Educated pt on ability to take rest breaks (standing or sitting) PRN & need to ambulate within base of RW instead of pushing it out in front of him when fatigued.  Pt then performed blocked practice sit<>stand from chair without armrests to simulate dining room chair at home & pt able to do so with supervision. Pt utilized dynavision while standing with RUE support and supervision for standing balance and utilizing LUE to push  lights - task focused on LUE NMR via forced use with pt demonstrating impaired coordination and decreased shoulder flexion. Educated pt on stroke and stroke recovery. At end of session pt left in w/c in handoff to SLP.  Therapy Documentation Precautions:  Precautions Precautions: Fall Restrictions Weight Bearing Restrictions: No   See Function Navigator for Current Functional Status.   Therapy/Group: Individual Therapy  Sandi MariscalVictoria M Malic Rosten 12/17/2017, 3:04 PM

## 2017-12-18 ENCOUNTER — Inpatient Hospital Stay (HOSPITAL_COMMUNITY): Payer: Medicare Other | Admitting: Occupational Therapy

## 2017-12-18 ENCOUNTER — Inpatient Hospital Stay (HOSPITAL_COMMUNITY): Payer: Medicare Other

## 2017-12-18 LAB — GLUCOSE, CAPILLARY
GLUCOSE-CAPILLARY: 101 mg/dL — AB (ref 70–99)
GLUCOSE-CAPILLARY: 155 mg/dL — AB (ref 70–99)
Glucose-Capillary: 103 mg/dL — ABNORMAL HIGH (ref 70–99)
Glucose-Capillary: 120 mg/dL — ABNORMAL HIGH (ref 70–99)

## 2017-12-18 MED ORDER — TOPIRAMATE 25 MG PO TABS
25.0000 mg | ORAL_TABLET | Freq: Two times a day (BID) | ORAL | Status: DC
  Administered 2017-12-18 – 2017-12-21 (×6): 25 mg via ORAL
  Filled 2017-12-18 (×6): qty 1

## 2017-12-18 NOTE — Progress Notes (Signed)
Physical Therapy Session Note  Patient Details  Name: Zachary Deleon MRN: [id number] Date of Birth: [birth year]  Today's Date: 12/18/2017 PT Individual Time: 1000-1055 PT Individual Time Calculation (min): 55 min   Short Term Goals: Week 2:  PT Short Term Goal 1 (Week 2): STG = LTG due to estimated d/c date.  Skilled Therapeutic Interventions/Progress Updates:    Pt seated in w/c upon PT arrival, agreeable to therapy tx and reports pain 1/10 in R knee. Pt also reports blister on L lateral heel, therapist suspects from L AFO. Therapist added foam to metal upright and will consult orthotist. Therapist assisted pt to don socks, shoes and L AFO for time management. Pt transported to the gym in w/c. Pt ambulated x 80 ft with RW, L hand orthosis and L AFO, min guard. Pt performed toe taps on 4 inch step without UE support working on dynamic balance and stance control, min assist. Pt performed sit<>stands from edge of mat x 10 with emphasis on symmetric LE weightbearing for neuro re-ed, manual facilitation for increased L lateral weightshift. Pt performed 2 x 5 sit<>stands from elevated mat with R LE on airex for increased L LE weightbearing, no UE support and verbal cues for techniques, min assist. Pt ambulated from gym<>rehab apartment 2 x 80 ft with RW and contact guard, performed furniture transfer on/off recliner with min assist. Pt transported back to room, doffed shoes and L AFO to minimize skin break down, educated pt on performing skin checks frequently secondary to poor sensation. Pt transferred to bed and left supine with bed alarm set, needs in reach.   Therapy Documentation Precautions:  Precautions Precautions: Fall Restrictions Weight Bearing Restrictions: No   See Function Navigator for Current Functional Status.   Therapy/Group: Individual Therapy  Cresenciano GenreEmily van Schagen, PT, DPT 12/18/2017, 8:00 AM

## 2017-12-18 NOTE — Progress Notes (Signed)
Pt not wanting to wear Cpap at this time.

## 2017-12-18 NOTE — Progress Notes (Signed)
[place] PHYSICAL MEDICINE & REHABILITATION     PROGRESS NOTE  Subjective/Complaints:   Sensation reduced Left hand , still with HA  ROS: - CP on left , no pain with exertion, no abd pain no N/V/D  Objective: Vital Signs: Blood pressure 138/85, pulse 61, temperature 98.3 F (36.8 C), temperature source Oral, resp. rate 18, height 6\' 2"  (1.88 m), weight 132 kg (291 lb 1.6 oz), SpO2 98 %. No results found. No results for input(s): WBC, HGB, HCT, PLT in the last 72 hours. No results for input(s): NA, K, CL, GLUCOSE, BUN, CREATININE, CALCIUM in the last 72 hours.  Invalid input(s): CO CBG (last 3)  Recent Labs    12/17/17 1631 12/17/17 2105 12/18/17 0639  GLUCAP 104* 124* 101*    Wt Readings from Last 3 Encounters:  12/07/17 132 kg (291 lb 1.6 oz)  12/06/17 133.4 kg (294 lb)    Physical Exam:  BP 138/85 (BP Location: Right Arm)   Pulse 61   Temp 98.3 F (36.8 C) (Oral)   Resp 18   Ht 6\' 2"  (1.88 m)   Wt 132 kg (291 lb 1.6 oz)   SpO2 98%   BMI 37.38 kg/m  Constitutional: He appearswell-developed.No distress.obese HENT: Normocephalicand atraumatic.  Eyes:EOMare normal. No discharge. Cardiovascular:RRR. No JVD.Ecchymosis on left side ant ax line lower rib areaRespiratory:Effort normal. Clear. ZO:XWRU.[id number]. Musculoskeletal: Left upper extremity tenderness.pain with L shoulder ROM.  Pain to plap over the left side lower ribs orse at ant axillary line, low back paraspinal tenderness    Neurological: He isalertand oriented. Dysarthria Left facial weakness Motor: LUE: 4-/5 proximal to distal (improved) LLE: 4-/5 proximal to distal, trace ankle on left Absent LT sensation LLE,reduced LUE, + asterognosis RUE/RLE 5/5 proximal to distal. Skin: Skin iswarmand dry.  Psychiatric: He has anormal mood and affect. Hisbehavior is normal.  Assessment/Plan: 1. 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 as well as history of brain  tumor which require 3+ hours per day of interdisciplinary therapy in a comprehensive inpatient rehab setting. Physiatrist is providing close team supervision and 24 hour management of active medical problems listed below. Physiatrist and rehab team continue to assess barriers to discharge/monitor patient progress toward functional and medical goals.  Function:  Bathing Bathing position   Position: Shower  Bathing parts Body parts bathed by patient: Right arm, Left arm, Abdomen, Chest, Front perineal area, Buttocks, Right upper leg, Left upper leg, Right lower leg, Left lower leg Body parts bathed by helper: Back  Bathing assist Assist Level: Touching or steadying assistance(Pt > 75%)      Upper Body Dressing/Undressing Upper body dressing   What is the patient wearing?: Pull over shirt/dress     Pull over shirt/dress - Perfomed by patient: Thread/unthread right sleeve, Put head through opening, Pull shirt over trunk, Thread/unthread left sleeve Pull over shirt/dress - Perfomed by helper: Thread/unthread left sleeve        Upper body assist Assist Level: Supervision or verbal cues, Set up   Set up : To obtain clothing/put away  Lower Body Dressing/Undressing Lower body dressing   What is the patient wearing?: Pants, Non-skid slipper socks, Underwear Underwear - Performed by patient: Thread/unthread right underwear leg, Thread/unthread left underwear leg, Pull underwear up/down Underwear - Performed by helper: Pull underwear up/down Pants- Performed by patient: Thread/unthread right pants leg, Thread/unthread left pants leg, Pull pants up/down Pants- Performed by helper: Thread/unthread left pants leg, Pull pants up/down Non-skid slipper socks- Performed by patient: Don/doff  right sock, Don/doff left sock Non-skid slipper socks- Performed by helper: Don/doff right sock, Don/doff left sock Socks - Performed by patient: Don/doff right sock, Don/doff left sock   Shoes - Performed by  patient: Don/doff right shoe, Don/doff left shoe Shoes - Performed by helper: Fasten right, Fasten left AFO - Performed by patient: Don/doff left AFO AFO - Performed by helper: Don/doff left AFO      Lower body assist Assist for lower body dressing: Touching or steadying assistance (Pt > 75%)      Toileting Toileting   Toileting steps completed by patient: Adjust clothing prior to toileting, Performs perineal hygiene, Adjust clothing after toileting Toileting steps completed by helper: Adjust clothing after toileting Toileting Assistive Devices: Grab bar or rail  Toileting assist Assist level: Touching or steadying assistance (Pt.75%)   Transfers Chair/bed transfer   Chair/bed transfer method: Stand pivot Chair/bed transfer assist level: Supervision or verbal cues Chair/bed transfer assistive device: Armrests     Locomotion Ambulation     Max distance: 170 ft Assist level: Touching or steadying assistance (Pt > 75%)   Wheelchair   Type: Manual Max wheelchair distance: 150 Assist Level: Supervision or verbal cues  Cognition Comprehension Comprehension assist level: Follows basic conversation/direction with extra time/assistive device  Expression Expression assist level: Expresses basic needs/ideas: With extra time/assistive device  Social Interaction Social Interaction assist level: Interacts appropriately with others with medication or extra time (anti-anxiety, antidepressant).  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  Cont CIR, PT, OT, SLP - team conf in am  2. DVT  Prophylaxis/Anticoagulation: Subcutaneous Lovenox. Monitor for any bleeding episodes 3. Pain Management:Neurontin 100 mg 3 times daily, Ultram as needed, post CVA HA trial topiramate increase to BID 4. Mood:Prozac 20 mg daily, reduce Seroquel to  12.5 mg twice daily as needed 5. Neuropsych: This patientiscapable of making decisions on hisown behalf. 6. Skin/Wound Care:Routine skin checks 7. Fluids/Electrolytes/Nutrition:Routine in and outs 8.Asymptomatic carotid stenosis. [facility]Velasquez Derrell Lollingamirez. 9.Hypertension. Monitor with increased mobility. Patient on Norvasc 10 mg daily, Zestoretic 20-12.5 mg daily prior to admission. Resume as needed   Vitals:   12/17/17 2001 12/18/17 0425  BP:  138/85  Pulse:  61  Resp:  18  Temp:  98.3 F (36.8 C)  SpO2: 97% 98%  Controlled amlodipine 5mg  started 7/8 10.Diabetes mellitus. Hemoglobin A1c 11.4.  -NovoLog 10 units 3 times daily, Lantus insulin 45 units daily. -Check blood sugars before meals and at bedtime. Diabetic teaching   CBG (last 3)  Recent Labs    12/17/17 1631 12/17/17 2105 12/18/17 0639  GLUCAP 104* 124* 101*  good in hospital control 7/9 11.Tobacco abuse. NicoDerm patch. Counseling 12.Hyperlipidemia. Lipitor/fenofibrate 13.Medical noncompliance. Counseling 14.Morbid Obesity. BMI 37.38. Dietary follow-up  16. Hypomagnesemia  Improved to 1.7 7/3  17. Thrombocytopenia  Platelets 138 on 6/28, stable at 131K on 7/1   Continue to monitor- no sign of hemorrhage 18.  Loose stool  LOS (Days) 11 A FACE TO FACE EVALUATION WAS PERFORMED  Erick Colacendrew E Emelio Schneller 12/18/2017 8:26 AM

## 2017-12-18 NOTE — Progress Notes (Signed)
Speech Language Pathology Daily Session Note  Patient Details  Name: Zachary Deleon MRN: [id number] Date of Birth: 21-Dec-1965  Today's Date: 12/18/2017 SLP Individual Time: 0830-0930 SLP Individual Time Calculation (min): 60 min  Short Term Goals: Week 2: SLP Short Term Goal 1 (Week 2): Pt will utilize speech intelligibility strategies to achieve 90%-100% intelligibility at the simple conversation level with supervision cues. SLP Short Term Goal 2 (Week 2): Pt will selectively attend to task in a moderately distracting environment for up to 30 minutes with Mod I. SLP Short Term Goal 3 (Week 2): Pt will complete semi-complex reasoning tasks with supervision cues.  SLP Short Term Goal 4 (Week 2): Pt will demonstrate anticipatory awareness by listing 3 activities that are safe and not safe to perform within the home environment with supervision cues.   Skilled Therapeutic Interventions:Skilled ST services focused on cognitive skills. SLP facilitated semi-complex problem solving with medication management task, pt required supervision A verbal cues for error awareness and min-supervision A verbal cues for simple deductive task. SLP facilitated anticipatory awareness given leading question pt demonstrated ability to list 3 safe and 3 unsafe activities upon returning home with supervision A questions and verbal cues.Pt demonstrated selective attention with TV with Mod I for 45 minutes. Pt was left in room with call bell within reach and bed alaram set.ST reccomends to continuedd skilled ST services.      Function:  Eating Eating   Modified Consistency Diet: No Eating Assist Level: No help, No cues   Eating Set Up Assist For: Opening containers       Cognition Comprehension Comprehension assist level: Follows basic conversation/direction with no assist  Expression   Expression assist level: Expresses basic needs/ideas: With extra time/assistive device  Social Interaction Social Interaction  assist level: Interacts appropriately with others with medication or extra time (anti-anxiety, antidepressant).  Problem Solving Problem solving assist level: Solves basic problems with no assist;Solves complex 90% of the time/cues < 10% of the time  Memory Memory assist level: Recognizes or recalls 90% of the time/requires cueing < 10% of the time    Pain Pain Assessment Pain Scale: 0-10 Pain Score: 0-No pain Pain Type: Chronic pain Pain Location: Head Pain Orientation: Anterior(pt states "behind my eyes") Pain Descriptors / Indicators: Headache Pain Onset: With Activity Patients Stated Pain Goal: 0 Pain Intervention(s): Medication (See eMAR)(PRN tylenol) Multiple Pain Sites: No  Therapy/Group: Individual Therapy  [name]  [facility] 12/18/2017, 11:09 AM

## 2017-12-18 NOTE — Progress Notes (Signed)
Occupational Therapy Session Note  Patient Details  Name: Zachary Deleon MRN: [id number] Date of Birth: July 25, 1965  Today's Date: 12/18/2017 OT Individual Time: [id number] and 1300-1345 OT Individual Time Calculation (min): 60 min and 45 min   Short Term Goals: Week 2:  OT Short Term Goal 1 (Week 2): Pt will complete LB dressing with min assist OT Short Term Goal 2 (Week 2): Pt will don shoes with AFO with min assist OT Short Term Goal 3 (Week 2): Pt will complete 3/3 toileting tasks with min assist for standing balance OT Short Term Goal 4 (Week 2): Pt will complete toilet transfers with supervision with LRAD  Skilled Therapeutic Interventions/Progress Updates:    1) Treatment session with focus on functional transfers and LUE NMR.  Pt received supine in bed asleep, pt easily aroused to name.  Engaged in self-feeding with focus on use of LUE as gross to diminished assist when opening containers.  Pt reports needing to toilet.  Completed stand pivot transfer bed > w/c > toilet with close supervision.  Pt completed clothing management in standing with min guard and hygiene with lateral leans without assistance.  Completed hand hygiene prior to returning to sitting EOB to finish breakfast.  Engaged in LUE NMR with use of resistive clothespins.  Pt able to reach across midline to grasp yellow, red, and green clothespins and place on horizontal rods.  Pt demonstrating increased difficulty with green (strongest resistance of the 3).  Pt reports difficulty with wrist extension and slight pain in Lt shoulder with elevation and reaching across midline.  Plan to try e-stim for pain management during PM session.  Pt notified therapist of AFO rubbing blister and bleeding on heel.  Notified PT who notified orthotist to further assess fit due to absent sensation in Lt foot.  2) Treatment session with focus on LUE NMR, pain management, and donning Lt AFO.  Pt received upright in recliner reporting ready to  engage in therapy session.  Donned Lt shoe with AFO with min assist for setup and increased time to fasten strap.  Pt ambulated 8' with RW with min guard to supervision.  Engaged in LUE NMR in sitting and standing with focus on increased shoulder range and grasp with pegs in resistive peg board.  Pt reports pain in Lt shoulder with reaching, rating it 4/10.  NMES applied to supraspinatus and middle deltoid to help approximate shoulder joint to reduce sublux and reduce pain.  Continued to engage in peg board activity with NMES applied with pt reporting decrease in pain with movement.  Pt ambulated back to bed 10' with RW and AFO.  Orthotist arrived at end of session and plan to bring a different AFO to assess tomorrow.  Ratio 1:3 Rate 35 pps Waveform- Asymmetric Ramp 1.0 Pulse 300 Intensity- 26 Duration -   10 min  Report of pain at the beginning of session 4/10 Report of pain at the end of session 2/10  No adverse reactions after treatment and skin intact.    Therapy Documentation Precautions:  Precautions Precautions: Fall Restrictions Weight Bearing Restrictions: No Pain: Pain Assessment Pain Scale: 0-10 Pain Score: 0-No pain  See Function Navigator for Current Functional Status.   Therapy/Group: Individual Therapy  Rosalio LoudHOXIE, Vola Beneke 12/18/2017, 10:43 AM

## 2017-12-19 ENCOUNTER — Inpatient Hospital Stay (HOSPITAL_COMMUNITY): Payer: Medicare Other | Admitting: Physical Therapy

## 2017-12-19 ENCOUNTER — Inpatient Hospital Stay (HOSPITAL_COMMUNITY): Payer: Medicare Other

## 2017-12-19 ENCOUNTER — Inpatient Hospital Stay (HOSPITAL_COMMUNITY): Payer: Medicare Other | Admitting: Speech Pathology

## 2017-12-19 LAB — GLUCOSE, CAPILLARY
GLUCOSE-CAPILLARY: 112 mg/dL — AB (ref 70–99)
GLUCOSE-CAPILLARY: 99 mg/dL (ref 70–99)
GLUCOSE-CAPILLARY: 150 mg/dL — AB (ref 70–99)
Glucose-Capillary: 135 mg/dL — ABNORMAL HIGH (ref 70–99)

## 2017-12-19 MED ORDER — HYDROCHLOROTHIAZIDE 12.5 MG PO CAPS: 12.5000 mg | ORAL_CAPSULE | Freq: Every day | ORAL | Status: DC

## 2017-12-19 MED ORDER — AMLODIPINE BESYLATE 10 MG PO TABS
10.0000 mg | ORAL_TABLET | Freq: Every day | ORAL | Status: DC
  Administered 2017-12-20 – 2017-12-21 (×2): 10 mg via ORAL
  Filled 2017-12-19 (×2): qty 1

## 2017-12-19 NOTE — Progress Notes (Signed)
Physical Therapy Session Note  Patient Details  Name: Lucianne Leilton J Mclinden MRN: [id number] Date of Birth: 05-Mar-1966  Today's Date: 12/19/2017 PT Individual Time: 1430-1500 PT Individual Time Calculation (min): 30 min   Short Term Goals: Week 2:  PT Short Term Goal 1 (Week 2): STG = LTG due to estimated d/c date.  Skilled Therapeutic Interventions/Progress Updates: Pt received seated on EOB; denies pain and agreeable to treatment. MinA to don B shoes including L AFO; increased time with pt attempting to perform without assist. Gait to/from gym with RW and min guard; tactile cues at L glute during stance and at L posterior knee to reduce recurvatum in late stance phased; cues periodically reduced to assess carryover, and maintains approx 1-2 steps before returning to impaired mechanics. Forward/backward walking with R hal rail x25' with min guard; cues as above for technique. Second trial forward/backward with R 5th finger dragging along wall hovering over rail to reduce reliance on UEs. Side stepping R/L x25' each direction, tactile cues for hip abduction facilitation. Returned to room with gait as above; remained seated on EOB at end of session, alarm intact, all needs in reach. Alerted CSW to pt concerns regarding equipment needs.      Therapy Documentation Precautions:  Precautions Precautions: Fall Restrictions Weight Bearing Restrictions: No  See Function Navigator for Current Functional Status.   Therapy/Group: Individual Therapy  Harlon Dittylizabeth J Kimberlyn Quiocho 12/19/2017, 3:02 PM

## 2017-12-19 NOTE — Progress Notes (Signed)
Physical Therapy Session Note  Patient Details  Name: Zachary Deleon MRN: [id number] Date of Birth: 1966/04/01  Today's Date: 12/19/2017 PT Individual Time: [id number] PT Individual Time Calculation (min): 58 min   Short Term Goals: Week 2:  PT Short Term Goal 1 (Week 2): STG = LTG due to estimated d/c date.  Skilled Therapeutic Interventions/Progress Updates:    Pt seated EOB upon PT arrival, agreeable to therapy tx and denies pain. Pt transferred sit>stand with supervision and RW, pt ambulated x 100 ft to the gym before losing balance to the R, pt states that his last therapist "wore him out." Therapist transported pt in w/c the rest of the way secondary to fatigue. Pt continues to repeat "She wore me out," pt requesting to work on some UE coordination. Pt worked on standing balance and L UE coordination to complete peg board puzzle without UE support. Pt transferred to mat with supervision and RW, verbal cues for safety/techniques. Pt performed sit<>stand with supervision and performed x 10 mini squats with emphasis on symmetric LE weightbearing, use of mirror for visual feedback to increased L lateral weightshift. Pt worked on dynamic standing balance while standing on airex without UE support to toss horseshoes, alternating UEs, x 2 trials. Pt performed floor transfer from mat<>floor with min assist, verbal cues for techniques and education on precautions to take if the pt falls at home. Pt transported back to room at end of session, transfer to bed with supervision, left seated with bed alarm set.   Therapy Documentation Precautions:  Precautions Precautions: Fall Restrictions Weight Bearing Restrictions: No   See Function Navigator for Current Functional Status.   Therapy/Group: Individual Therapy  Cresenciano GenreEmily van Schagen, PT, DPT 12/19/2017, 12:16 PM

## 2017-12-19 NOTE — Progress Notes (Signed)
Occupational Therapy Session Note  Patient Details  Name: Zachary Deleon MRN: [id number] Date of Birth: 02-09-1966  Today's Date: 12/19/2017 OT Individual Time: 1300-1400 OT Individual Time Calculation (min): 60 min    Short Term Goals: Week 2:  OT Short Term Goal 1 (Week 2): Pt will complete LB dressing with min assist OT Short Term Goal 2 (Week 2): Pt will don shoes with AFO with min assist OT Short Term Goal 3 (Week 2): Pt will complete 3/3 toileting tasks with min assist for standing balance OT Short Term Goal 4 (Week 2): Pt will complete toilet transfers with supervision with LRAD  Skilled Therapeutic Interventions/Progress Updates:    OT intervention with focus on functional amb with RW, LUE NMR, and LUE therapeutic tasks to increase independence with BADLs and functional use of LUE.  Pt sat EOB with supervision and amb with RW from room to nursing station with supervision before requesting rest break.  Pt continued to therapy gym and Kinesio Tape applied to LUE for pain relief and facilitate deltoid/supraspinitus activation.  Pt engaged in peg board tasks with focus on placing and removing pegs from board with LUE.  Pt completed task with more than a reasonable amount of time.  Pt reports absent sensation in L hand but incorporates compensatory strategies to successfully complete task.  Pt also practiced removing small beads from theraputty with L hand.  Pt returned to room and transferred to bed.  Pt remained seated EOB with NT present.   Therapy Documentation Precautions:  Precautions Precautions: Fall Restrictions Weight Bearing Restrictions: No    Pain: Pain Assessment Pain Scale: 0-10 Pain Score: 2  Pain Location: Head Pain Descriptors / Indicators: Headache Pain Intervention(s): RN made aware  See Function Navigator for Current Functional Status.   Therapy/Group: Individual Therapy  Rich BraveLanier, Fidencia Mccloud Chappell 12/19/2017, 3:00 PM

## 2017-12-19 NOTE — Progress Notes (Signed)
Social Work Patient ID: Zachary Deleon, male   DOB: 02/01/1966, 52 y.o.   MRN: [id number]  Met with pt and spoke with wife via telephone to discuss team conference and progress. Her car has broken down and this is the reason she has not been here until late when her girls are off work and can bring her. Ramp being done today and preparing for discharge Friday. She will try to get in prior to his discharge Friday for some family education, although she feels she knows what to do for him.

## 2017-12-19 NOTE — Plan of Care (Signed)
  Problem: Consults Goal: RH STROKE PATIENT EDUCATION Description See Patient Education module for education specifics  Outcome: Progressing Goal: Diabetes Guidelines if Diabetic/Glucose > 140 Description If diabetic or lab glucose is > 140 mg/dl - Initiate Diabetes/Hyperglycemia Guidelines & Document Interventions  Outcome: Progressing   Problem: RH BLADDER ELIMINATION Goal: RH STG MANAGE BLADDER WITH ASSISTANCE Description STG Manage Bladder With min Assistance  Outcome: Progressing   Problem: RH SAFETY Goal: RH STG ADHERE TO SAFETY PRECAUTIONS W/ASSISTANCE/DEVICE Description STG Adhere to Safety Precautions With supervision Assistance/Device.  Outcome: Progressing   Problem: RH PAIN MANAGEMENT Goal: RH STG PAIN MANAGED AT OR BELOW PT'S PAIN GOAL Description < 5  Outcome: Progressing   Problem: RH KNOWLEDGE DEFICIT Goal: RH STG INCREASE KNOWLEDGE OF DIABETES Description Min assist  Outcome: Progressing Goal: RH STG INCREASE KNOWLEDGE OF HYPERTENSION Description Min assist.  Outcome: Progressing

## 2017-12-19 NOTE — Progress Notes (Signed)
Social Work   Eliese Kerwood, Elveria Rising  Social Worker  Physical Medicine and Rehabilitation  Patient Care Conference  Signed  Date of Service:  12/19/2017  1:37 PM          Signed          Show:Clear all [x] Manual[x] Template[] Copied  Added by: [x] Lucy [name], LCSW   [] Hover for details   Inpatient RehabilitationTeam Conference and Plan of Care Update Date: 12/19/2017   Time: 10:30 AM      Patient Name: Zachary Deleon [facility]      Medical Record Number: [id number]  Date of Birth: 10-30-1965 Sex: Male         Room/Bed: 4W26C/4W26C-01 Payor Info: Payor: MEDICARE / Plan: MEDICARE PART A AND B / Product Type: *No Product type* /     Admitting Diagnosis: cva  Admit Date/Time:  12/07/2017  9:22 AM Admission Comments: No comment available    Primary Diagnosis:  <principal problem not specified> Principal Problem: <principal problem not specified>       Patient Active Problem List    Diagnosis Date Noted  . Thrombocytopenia (HCC)    . Hypomagnesemia    . Hyponatremia    . Diabetes mellitus type 2 in obese (HCC)    . 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 Left hemiparesis (HCC)    . Morbid obesity (HCC)    . Essential hypertension    . Stroke (HCC) 12/02/2017  . Type 2 diabetes mellitus without complication (HCC) 12/02/2017      Expected Discharge Date: Expected Discharge Date: 12/21/17   Team Members Present: Physician leading conference: Dr. Claudette Laws Social Worker Present: Dossie Der, LCSW Nurse Present: Other (comment)(Kayla Mabe-RN) PT Present: Woodfin Ganja, PT OT Present: Callie Fielding, OT SLP Present: Jackalyn Lombard, SLP PPS Coordinator present : Tora Duck, RN, CRRN       Current Status/Progress Goal Weekly Team Focus  Medical     Shoulder pain improved back pain improved, some impulsivity.  Blood pressure still elevated  Reduce blood pressure, reduce fall risk  Blood pressure management, discharge   Bowel/Bladder    cont of bowel and bladder.   To contine to have control over bowel and bladder.   Pt will continue to call for assistance to bathroom or with urinal.    Swallow/Nutrition/ Hydration               ADL's     supervision stand pivot transfers, min guard with RW, supervision UB dressing, min/steady assist bathing and LB dressing  Supervision  ADL retraining, bathroom transfers with RW, LUE NMR, dynamic standing balance   Mobility     min guard/supervision overall with RW, gait up to 150 ft with RW and L AFO  supervision with RW  d/c planning, family ed, NMR, balance, gait training with AFP   Communication     Supervision-mod I   Mod I   continue to address education and carryover of intelligibility strategies, suspect pt is at/near baseline    Safety/Cognition/ Behavioral Observations   Supervision   mod I   continue to address attention, awareness, and problem solving    Pain     C/o headache, knee and left side pain.   Pain will be 3 or less daily.   Pt is to continue to report symptoms for MD to review as needed.    Skin     Open area to ankle.   To fully heal without infection.  Area will remain infection free and heal within time for discharge.      *See Care Plan and progress notes for long and short-term goals.      Barriers to Discharge   Current Status/Progress Possible Resolutions Date Resolved   Physician     Medical stability;Weight     Progressing towards goals  Plan discharge at end of week      Nursing                 PT                    OT                 SLP            SW              Discharge Planning/Teaching Needs:  Wife has been in for therapies and training. Preparing for DC Friday, both feel he will be ready and looking forward to DC      Team Discussion:  Progressing toward his goals and will be ready to go on Friday. Fatigues easily and pain is better with the medications. L-AFO being adjusted. Restarted HTN med for home. Family education with wife  prior to discharge Friday. Ramp being completed today. No Speech follow back to baseline.  Revisions to Treatment Plan:  DC 7/12    Continued Need for Acute Rehabilitation Level of Care: The patient requires daily medical management by a physician with specialized training in physical medicine and rehabilitation for the following conditions: Daily direction of a multidisciplinary physical rehabilitation program to ensure safe treatment while eliciting the highest outcome that is of practical value to the patient.: Yes Daily medical management of patient stability for increased activity during participation in an intensive rehabilitation regime.: Yes Daily analysis of laboratory values and/or radiology reports with any subsequent need for medication adjustment of medical intervention for : Neurological problems;Blood pressure problems;Diabetes problems   Kennedey Digilio, Lemar Livings 12/19/2017, 1:38 PM                  Luella Gardenhire, Lemar Livings, LCSW  Social Worker  Physical Medicine and Rehabilitation  Patient Care Conference  Signed  Date of Service:  12/12/2017  1:34 PM          Signed          Show:Clear all [x] Manual[x] Template[] Copied  Added by: [x] Francine Hannan, Lemar Livings, LCSW   [] Hover for details   Inpatient RehabilitationTeam Conference and Plan of Care Update Date: 12/12/2017   Time: 10:45 AM      Patient Name: Zachary Deleon [facility]      Medical Record Number: [id number]  Date of Birth: 29-Jun-1965 Sex: Male         Room/Bed: 4W26C/4W26C-01 Payor Info: Payor: MEDICARE / Plan: MEDICARE PART A AND B / Product Type: *No Product type* /     Admitting Diagnosis: cva  Admit Date/Time:  12/07/2017  9:22 AM Admission Comments: No comment available    Primary Diagnosis:  <principal problem not specified> Principal Problem: <principal problem not specified>       Patient Active Problem List    Diagnosis Date Noted  . Thrombocytopenia (HCC)    . Hypomagnesemia    . Hyponatremia    .  Diabetes mellitus type 2 in obese (HCC)    . 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 Left hemiparesis (HCC)    . Morbid obesity (HCC)    .  Essential hypertension    . Stroke (HCC) 12/02/2017  . Type 2 diabetes mellitus without complication (HCC) 12/02/2017      Expected Discharge Date: Expected Discharge Date: 12/21/17   Team Members Present: Physician leading conference: Dr. Claudette LawsAndrew Kirsteins Social Worker Present: Dossie DerBecky Monseratt Ledin, LCSW Nurse Present: Keturah BarreEd Knisley, RN PT Present: Aleda GranaVictoria Miller, PT OT Present: Rosalio LoudSarah Hoxie, OT SLP Present: Feliberto Gottronourtney Payne, SLP PPS Coordinator present : Tora DuckMarie Noel, RN, CRRN       Current Status/Progress Goal Weekly Team Focus  Medical     Cont bowel and bladder , impulsivity, Left shoulder pain, Back pain  reduce fall risk  address multiple pain issue   Bowel/Bladder     1 person asssist with ambulating or empting urinal  To continue to maintain bladder control.   Pt will void by self at least q8 hrs.    Swallow/Nutrition/ Hydration               ADL's     min assist stand pivot transfers, supervision UB dressing, min assist bathing, mod assist LB dressing  Supervision  ADL retraining, dynamic standing balance, LUE NMR   Mobility     min/mod assist overall, AFO consult to help correct L knee hyperextension during gait, impulsive, decreased safety awareness, supervision w/c mobility in controlled environment, short distance gait with RW, pt/family education  supervision, min assist overall with LRAD  transfers, gait, activity tolerance, w/c mobility, pt/family education, NMR, balance, strengthening   Communication     Min - Supervision A  Mod I  speech intellgibility strategies sentence level    Safety/Cognition/ Behavioral Observations   Alert and oriented x4. Has trouble falling asleep.   To obtain appropiate sleeping pattern.   Pt requesst PRN for sleep when in need.    Pain     C/o headache. Agitation present.   to keep pain  rate 3 or below.   Pain is to be control with PRNs when available.    Skin     No wound care            *See Care Plan and progress notes for long and short-term goals.      Barriers to Discharge   Current Status/Progress Possible Resolutions Date Resolved   Physician     Medical stability     Progressing toward goals        Nursing                 PT  Inaccessible home environment;Decreased caregiver support;Home environment access/layout;Medication compliance                 OT                 SLP            SW Medication compliance needs to take meds at home             Discharge Planning/Teaching Needs:  Home with wife who is unemployed and can assist pt at home. She is here daily and observing pt in therapies.      Team Discussion:  Goals supervision-min assist level. Balance and strengthening working on. Speech intelligibility is improving. Impulsive at times and wants to move quickly. AFO ordered. Voltaren gel for knee along with K-pad and muscle relaxer. Good return in left arm and hand. Wife has been in for observation in therapies.  Revisions to Treatment Plan:  DC 7/12    Continued Need for Acute Rehabilitation  Level of Care: The patient requires daily medical management by a physician with specialized training in physical medicine and rehabilitation for the following conditions: Daily direction of a multidisciplinary physical rehabilitation program to ensure safe treatment while eliciting the highest outcome that is of practical value to the patient.: Yes Daily medical management of patient stability for increased activity during participation in an intensive rehabilitation regime.: Yes Daily analysis of laboratory values and/or radiology reports with any subsequent need for medication adjustment of medical intervention for : Neurological problems;Diabetes problems;Mood/behavior problems   Lucy [name] 12/12/2017, 1:34 PM                 Patient ID:  [name], male   DOB: Apr 20, 1966, 52 y.o.   MRN: [id number]

## 2017-12-19 NOTE — Progress Notes (Signed)
Patient continues to refuse CPAP therapy. 

## 2017-12-19 NOTE — Progress Notes (Signed)
[facility]ILIT[facility]     PROGRESS NOTE  Subjective/Complaints:   Discussed smoking cessation.  Patient would like to use a vape without nicotine.  We discussed that long-term effects unknown .  ROS: - CP on left , no pain with exertion, no abd pain no N/V/D  Objective: Vital Signs: Blood pressure (!) 167/90, pulse 68, temperature 97.9 F (36.6 C), temperature source Oral, resp. rate 19, height '6\' 2"'$  (1.88 m), weight 132.4 kg (291 lb 14.2 oz), SpO2 94 %. No results found. No results for input(s): WBC, HGB, HCT, PLT in the last 72 hours. No results for input(s): NA, K, CL, GLUCOSE, BUN, CREATININE, CALCIUM in the last 72 hours.  Invalid input(s): CO CBG (last 3)  Recent Labs    12/18/17 1643 12/18/17 2052 12/19/17 0622  GLUCAP 155* 120* 112*    Wt Readings from Last 3 Encounters:  12/19/17 132.4 kg (291 lb 14.2 oz)  12/06/17 133.4 kg (294 lb)    Physical Exam:  BP (!) 167/90 (BP Location: Right Arm)   Pulse 68   Temp 97.9 F (36.6 C) (Oral)   Resp 19   Ht '6\' 2"'$  (1.88 m)   Wt 132.4 kg (291 lb 14.2 oz)   SpO2 94%   BMI 37.48 kg/m  Constitutional: He appearswell-developed.No distress.obese HENT: Normocephalicand atraumatic.  Eyes:EOMare normal. No discharge. Cardiovascular:RRR. No JVD.Ecchymosis on left side ant ax line lower rib areaRespiratory:Effort normal. Clear. WL:NLGX.QJJHERDEYCXK. Musculoskeletal: Left upper extremity tenderness.pain with L shoulder ROM.  Pain to plap over the left side lower ribs orse at ant axillary line, low back paraspinal tenderness    Neurological: He isalertand oriented. Dysarthria Left facial weakness Motor: LUE: 4-/5 proximal to distal (improved) LLE: 4-/5 proximal to distal, trace ankle on left Absent LT sensation LLE,reduced LUE, + asterognosis RUE/RLE 5/5 proximal to distal. Skin: Skin iswarmand dry.  Psychiatric: He has anormal mood and affect. Hisbehavior is  normal.  Assessment/Plan: 1. 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 as well as history of brain tumor which require 3+ hours per day of interdisciplinary therapy in a comprehensive inpatient rehab setting. Physiatrist is providing close team supervision and 24 hour management of active medical problems listed below. Physiatrist and rehab team continue to assess barriers to discharge/monitor patient progress toward functional and medical goals.  Function:  Bathing Bathing position   Position: Shower  Bathing parts Body parts bathed by patient: Right arm, Left arm, Abdomen, Chest, Front perineal area, Buttocks, Right upper leg, Left upper leg, Right lower leg, Left lower leg Body parts bathed by helper: Back  Bathing assist Assist Level: Touching or steadying assistance(Pt > 75%)      Upper Body Dressing/Undressing Upper body dressing   What is the patient wearing?: Pull over shirt/dress     Pull over shirt/dress - Perfomed by patient: Thread/unthread right sleeve, Put head through opening, Pull shirt over trunk, Thread/unthread left sleeve Pull over shirt/dress - Perfomed by helper: Thread/unthread left sleeve        Upper body assist Assist Level: Supervision or verbal cues, Set up   Set up : To obtain clothing/put away  Lower Body Dressing/Undressing Lower body dressing   What is the patient wearing?: Pants, Non-skid slipper socks, Underwear Underwear - Performed by patient: Thread/unthread right underwear leg, Thread/unthread left underwear leg, Pull underwear up/down Underwear - Performed by helper: Pull underwear up/down Pants- Performed by patient: Thread/unthread right pants leg, Thread/unthread left pants leg, Pull pants up/down Pants- Performed by  helper: Thread/unthread left pants leg, Pull pants up/down Non-skid slipper socks- Performed by patient: Don/doff right sock, Don/doff left sock Non-skid slipper socks- Performed by helper: Don/doff  right sock, Don/doff left sock Socks - Performed by patient: Don/doff right sock, Don/doff left sock   Shoes - Performed by patient: Don/doff right shoe, Don/doff left shoe Shoes - Performed by helper: Fasten right, Fasten left AFO - Performed by patient: Don/doff left AFO AFO - Performed by helper: Don/doff left AFO      Lower body assist Assist for lower body dressing: Touching or steadying assistance (Pt > 75%)      Toileting Toileting   Toileting steps completed by patient: Adjust clothing prior to toileting, Performs perineal hygiene, Adjust clothing after toileting Toileting steps completed by helper: Adjust clothing after toileting Toileting Assistive Devices: Grab bar or rail  Toileting assist Assist level: Touching or steadying assistance (Pt.75%)   Transfers Chair/bed transfer   Chair/bed transfer method: Stand pivot, Ambulatory Chair/bed transfer assist level: Touching or steadying assistance (Pt > 75%) Chair/bed transfer assistive device: Armrests     Locomotion Ambulation     Max distance: 80 ft Assist level: Touching or steadying assistance (Pt > 75%)   Wheelchair   Type: Manual Max wheelchair distance: 150 Assist Level: Supervision or verbal cues  Cognition Comprehension Comprehension assist level: Follows basic conversation/direction with no assist  Expression Expression assist level: Expresses basic needs/ideas: With extra time/assistive device  Social Interaction Social Interaction assist level: Interacts appropriately with others with medication or extra time (anti-anxiety, antidepressant).  Problem Solving Problem solving assist level: Solves basic problems with no assist, Solves complex 90% of the time/cues < 10% of the time  Memory Memory assist level: Recognizes or recalls 90% of the time/requires cueing < 10% of the time    Medical Problem List and Plan: 1.Left side weakness with dysarthriasecondary to right MCA territory infarction as well as  history of brain tumor many years ago with resection at [facility], PT, OT, SLP -Team conference today please see physician documentation under team conference tab, met with team face-to-face to discuss problems,progress, and goals. Formulized individual treatment plan based on medical history, underlying problem and comorbidities.   2. DVT Prophylaxis/Anticoagulation: Subcutaneous Lovenox. Monitor for any bleeding episodes 3. Pain Management:Neurontin 100 mg 3 times daily, Ultram as needed, post CVA HA trial topiramate increase to BID 4. Mood:Prozac 20 mg daily, reduce Seroquel to  12.5 mg twice daily as needed 5. Neuropsych: This patientiscapable of making decisions on hisown behalf. 6. Skin/Wound Care:Routine skin checks 7. Fluids/Electrolytes/Nutrition:Routine in and outs 8.Asymptomatic carotid stenosis. Patient followed vascular surgery [facility]. 9.Hypertension. Monitor with increased mobility. Patient on Norvasc 10 mg daily, Zestoretic 20-12.5 mg daily prior to admission. Resume as needed   Vitals:   12/18/17 1923 12/19/17 0420  BP: (!) 151/76 (!) 167/90  Pulse: 60 68  Resp: 18 19  Temp: 98 F (36.7 C) 97.9 F (36.6 C)  SpO2: 95% 94%  Controlled amlodipine 80m started 7/10 10.Diabetes mellitus. Hemoglobin A1c 11.4.  -NovoLog 10 units 3 times daily, Lantus insulin 45 units daily. -Check blood sugars before meals and at bedtime. Diabetic teaching   CBG (last 3)  Recent Labs    12/18/17 1643 12/18/17 2052 12/19/17 0622  GLUCAP 155* 120* 112*  good in hospital control 7/10 11.Tobacco abuse. NicoDerm patch. Counseling 12.Hyperlipidemia. Lipitor/fenofibrate 13.Medical noncompliance. Counseling 14.Morbid Obesity. BMI 37.38. Dietary follow-up  16. Hypomagnesemia  Improved to 1.7  7/3  17. Thrombocytopenia  Platelets 138 on 6/28, stable at 131K on  7/1   Continue to monitor- no sign of hemorrhage 18.  Loose stool  LOS (Days) 12 A FACE TO FACE EVALUATION WAS PERFORMED  Charlett Blake 12/19/2017 9:27 AM

## 2017-12-19 NOTE — Progress Notes (Signed)
Speech Language Pathology Daily Session Note  Patient Details  Name: Zachary Deleon MRN: [id number] Date of Birth: [birth year]  Today's Date: 12/19/2017 SLP Individual Time: [id number] SLP Individual Time Calculation (min): 55 min  Short Term Goals: Week 2: SLP Short Term Goal 1 (Week 2): Pt will utilize speech intelligibility strategies to achieve 90%-100% intelligibility at the simple conversation level with supervision cues. SLP Short Term Goal 2 (Week 2): Pt will selectively attend to task in a moderately distracting environment for up to 30 minutes with Mod I. SLP Short Term Goal 3 (Week 2): Pt will complete semi-complex reasoning tasks with supervision cues.  SLP Short Term Goal 4 (Week 2): Pt will demonstrate anticipatory awareness by listing 3 activities that are safe and not safe to perform within the home environment with supervision cues.   Skilled Therapeutic Interventions:  Pt was seen for skilled ST targeting cognitive goals.  Pt was received in bed upon arrival and was agreeable to transferring to wheelchair for participation in therapy.  Pt needed physical assist to donn AFO but he could verbally direct therapist to assist in donning with mod I.  SLP facilitated the session with structured conversations regarding anticipatory awareness and safety in the home environment.  Pt could identify at least 1 barrier in the home environment and could generate appropriate solutions with supervision question cues.  Pt had specific questions about vaping instead of smoking cigarettes during conversation.  Therapist suggested that pt discuss this with his MD and pt was able to recall his question during morning rounds with mod I.  Upon return to room pt requested to use the bathroom and demonstrated appropriate safety awareness when transferring on and off commode.  Pt was left in bed at the end of therapy session with bed alarm set and call bell within reach.  Continue per current plan of care.       Function:  Eating Eating   Modified Consistency Diet: No Eating Assist Level: No help, No cues           Cognition Comprehension Comprehension assist level: Follows complex conversation/direction with extra time/assistive device  Expression   Expression assist level: Expresses complex ideas: With extra time/assistive device  Social Interaction Social Interaction assist level: Interacts appropriately with others with medication or extra time (anti-anxiety, antidepressant).  Problem Solving Problem solving assist level: Solves basic 90% of the time/requires cueing < 10% of the time  Memory Memory assist level: Recognizes or recalls 90% of the time/requires cueing < 10% of the time    Pain Pain Assessment Pain Scale: 0-10 Pain Score: 2  Pain Location: Head Pain Orientation: Anterior Pain Descriptors / Indicators: Headache Pain Frequency: Intermittent Pain Onset: With Activity Patients Stated Pain Goal: 0 Pain Intervention(s): RN made aware  Therapy/Group: Individual Therapy  Chick Cousins, Melanee SpryNicole L 12/19/2017, 12:20 PM

## 2017-12-19 NOTE — Patient Care Conference (Signed)
Inpatient RehabilitationTeam Conference and Plan of Care Update Date: 12/19/2017   Time: 10:30 AM    Patient Name: [facility]ary Deleon [facility]      Medical Record Number: [id number]  Date of Birth: 11/14/1965 Sex: Male         Room/Bed: 4W26C/4W26C-01 Payor Info: Payor: MEDICARE / Plan: MEDICARE PART A AND B / Product Type: *No Product type* /    Admitting Diagnosis: cva  Admit Date/Time:  12/07/2017  9:22 AM Admission Comments: No comment available   Primary Diagnosis:  <principal problem not specified> Principal Problem: <principal problem not specified>  Patient Active Problem List   Diagnosis Date Noted  . Thrombocytopenia (HCC)   . Hypomagnesemia   . Hyponatremia   . Diabetes mellitus type 2 in obese (HCC)   . 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 Left hemiparesis (HCC)   . Morbid obesity (HCC)   . Essential hypertension   . Stroke (HCC) 12/02/2017  . Type 2 diabetes mellitus without complication (HCC) 12/02/2017    Expected Discharge Date: Expected Discharge Date: 12/21/17  Team Members Present: Physician leading conference: Dr. Claudette Laws Social Worker Present: Dossie Der, LCSW Nurse Present: Other (comment)(Kayla Mabe-RN) PT Present: Woodfin Ganja, PT OT Present: Callie Fielding, OT SLP Present: Jackalyn Lombard, SLP PPS Coordinator present : Tora Duck, RN, CRRN     Current Status/Progress Goal Weekly Team Focus  Medical   Shoulder pain improved back pain improved, some impulsivity.  Blood pressure still elevated  Reduce blood pressure, reduce fall risk  Blood pressure management, discharge   Bowel/Bladder   cont of bowel and bladder.   To contine to have control over bowel and bladder.   Pt will continue to call for assistance to bathroom or with urinal.    Swallow/Nutrition/ Hydration             ADL's   supervision stand pivot transfers, min guard with RW, supervision UB dressing, min/steady assist bathing and LB dressing  Supervision  ADL  retraining, bathroom transfers with RW, LUE NMR, dynamic standing balance   Mobility   min guard/supervision overall with RW, gait up to 150 ft with RW and L AFO  supervision with RW  d/c planning, family ed, NMR, balance, gait training with AFP   Communication   Supervision-mod I   Mod I   continue to address education and carryover of intelligibility strategies, suspect pt is at/near baseline    Safety/Cognition/ Behavioral Observations  Supervision   mod I   continue to address attention, awareness, and problem solving    Pain   C/o headache, knee and left side pain.   Pain will be 3 or less daily.   Pt is to continue to report symptoms for MD to review as needed.    Skin   Open area to ankle.   To fully heal without infection.   Area will remain infection free and heal within time for discharge.       *See Care Plan and progress notes for long and short-term goals.     Barriers to Discharge  Current Status/Progress Possible Resolutions Date Resolved   Physician    Medical stability;Weight     Progressing towards goals  Plan discharge at end of week      Nursing                  PT  OT                  SLP                SW                Discharge Planning/Teaching Needs:  Wife has been in for therapies and training. Preparing for DC Friday, both feel he will be ready and looking forward to DC      Team Discussion:  Progressing toward his goals and will be ready to go on Friday. Fatigues easily and pain is better with the medications. L-AFO being adjusted. Restarted HTN med for home. Family education with wife prior to discharge Friday. Ramp being completed today. No Speech follow back to baseline.  Revisions to Treatment Plan:  DC 7/12    Continued Need for Acute Rehabilitation Level of Care: The patient requires daily medical management by a physician with specialized training in physical medicine and rehabilitation for the following  conditions: Daily direction of a multidisciplinary physical rehabilitation program to ensure safe treatment while eliciting the highest outcome that is of practical value to the patient.: Yes Daily medical management of patient stability for increased activity during participation in an intensive rehabilitation regime.: Yes Daily analysis of laboratory values and/or radiology reports with any subsequent need for medication adjustment of medical intervention for : Neurological problems;Blood pressure problems;Diabetes problems  [facility], Lemar LivingsRebecca Deleon 12/19/2017, 1:38 PM

## 2017-12-20 ENCOUNTER — Inpatient Hospital Stay (HOSPITAL_COMMUNITY): Payer: Medicare Other | Admitting: Physical Therapy

## 2017-12-20 ENCOUNTER — Inpatient Hospital Stay (HOSPITAL_COMMUNITY): Payer: Medicare Other | Admitting: Speech Pathology

## 2017-12-20 ENCOUNTER — Inpatient Hospital Stay (HOSPITAL_COMMUNITY): Payer: Medicare Other | Admitting: Occupational Therapy

## 2017-12-20 LAB — GLUCOSE, CAPILLARY
Glucose-Capillary: 123 mg/dL — ABNORMAL HIGH (ref 70–99)
Glucose-Capillary: 104 mg/dL — ABNORMAL HIGH (ref 70–99)
Glucose-Capillary: 134 mg/dL — ABNORMAL HIGH (ref 70–99)
Glucose-Capillary: 106 mg/dL — ABNORMAL HIGH (ref 70–99)

## 2017-12-20 MED ORDER — FLUOXETINE HCL 20 MG PO CAPS: 20.0000 mg | ORAL_CAPSULE | Freq: Every day | ORAL | 3 refills | Status: DC

## 2017-12-20 MED ORDER — FENOFIBRATE 160 MG PO TABS: 160.0000 mg | ORAL_TABLET | Freq: Every day | ORAL | 1 refills | Status: DC

## 2017-12-20 MED ORDER — ACETAMINOPHEN 325 MG PO TABS: 650.0000 mg | ORAL_TABLET | ORAL | Status: DC | PRN

## 2017-12-20 MED ORDER — INSULIN GLARGINE 100 UNITS/ML SOLOSTAR PEN: 45.0000 [IU] | PEN_INJECTOR | Freq: Every day | SUBCUTANEOUS | 11 refills | Status: DC

## 2017-12-20 MED ORDER — METHOCARBAMOL 500 MG PO TABS: 500.0000 mg | ORAL_TABLET | Freq: Three times a day (TID) | ORAL | 0 refills | Status: DC | PRN

## 2017-12-20 MED ORDER — INSULIN ASPART 100 UNIT/ML FLEXPEN: 10.0000 [IU] | PEN_INJECTOR | Freq: Three times a day (TID) | SUBCUTANEOUS | 11 refills | Status: DC

## 2017-12-20 MED ORDER — NICOTINE 14 MG/24HR TD PT24
14.0000 mg | MEDICATED_PATCH | Freq: Every day | TRANSDERMAL | Status: DC
  Administered 2017-12-20: 14 mg via TRANSDERMAL
  Filled 2017-12-20: qty 1

## 2017-12-20 MED ORDER — ALBUTEROL SULFATE HFA 108 (90 BASE) MCG/ACT IN AERS: 2.0000 | INHALATION_SPRAY | RESPIRATORY_TRACT | 0 refills | Status: DC | PRN

## 2017-12-20 MED ORDER — AMLODIPINE BESYLATE 10 MG PO TABS: 10.0000 mg | ORAL_TABLET | Freq: Every day | ORAL | 1 refills | Status: AC

## 2017-12-20 MED ORDER — FLUTICASONE-SALMETEROL 250-50 MCG/DOSE IN AEPB: 1.0000 | INHALATION_SPRAY | Freq: Two times a day (BID) | RESPIRATORY_TRACT | 1 refills | Status: DC

## 2017-12-20 MED ORDER — QUETIAPINE FUMARATE 25 MG PO TABS: 12.5000 mg | ORAL_TABLET | Freq: Two times a day (BID) | ORAL | 0 refills | Status: DC | PRN

## 2017-12-20 MED ORDER — TRAMADOL HCL 50 MG PO TABS: 25.0000 mg | ORAL_TABLET | Freq: Four times a day (QID) | ORAL | 0 refills | Status: DC | PRN

## 2017-12-20 MED ORDER — GABAPENTIN 100 MG PO CAPS: 100.0000 mg | ORAL_CAPSULE | Freq: Three times a day (TID) | ORAL | 1 refills | Status: DC

## 2017-12-20 MED ORDER — CLOPIDOGREL BISULFATE 75 MG PO TABS: 75.0000 mg | ORAL_TABLET | Freq: Every day | ORAL | 1 refills | Status: DC

## 2017-12-20 MED ORDER — LIDOCAINE 5 % EX PTCH: 1.0000 | MEDICATED_PATCH | CUTANEOUS | 0 refills | Status: DC

## 2017-12-20 MED ORDER — DICLOFENAC SODIUM 1 % TD GEL: 2.0000 g | Freq: Four times a day (QID) | TRANSDERMAL | 1 refills | Status: DC

## 2017-12-20 MED ORDER — TOPIRAMATE 25 MG PO TABS: 25.0000 mg | ORAL_TABLET | Freq: Two times a day (BID) | ORAL | 1 refills | Status: DC

## 2017-12-20 MED ORDER — ENOXAPARIN SODIUM 80 MG/0.8ML ~~LOC~~ SOLN
65.0000 mg | SUBCUTANEOUS | Status: DC
  Administered 2017-12-20: 65 mg via SUBCUTANEOUS
  Filled 2017-12-20: qty 0.65

## 2017-12-20 MED ORDER — ATORVASTATIN CALCIUM 80 MG PO TABS: 80.0000 mg | ORAL_TABLET | Freq: Every day | ORAL | 1 refills | Status: DC

## 2017-12-20 MED ORDER — NICOTINE 14 MG/24HR TD PT24: MEDICATED_PATCH | TRANSDERMAL | 0 refills | Status: DC

## 2017-12-20 NOTE — Progress Notes (Signed)
Occupational Therapy Session Note  Patient Details  Name: Zachary Deleon MRN: [id number] Date of Birth: 1965-10-15  Today's Date: 12/20/2017 OT Individual Time: [id number] OT Individual Time Calculation (min): 55 min    Short Term Goals: Week 2:  OT Short Term Goal 1 (Week 2): Pt will complete LB dressing with min assist OT Short Term Goal 2 (Week 2): Pt will don shoes with AFO with min assist OT Short Term Goal 3 (Week 2): Pt will complete 3/3 toileting tasks with min assist for standing balance OT Short Term Goal 4 (Week 2): Pt will complete toilet transfers with supervision with LRAD  Skilled Therapeutic Interventions/Progress Updates:    Completed ADL retraining at overall supervision level.  Pt ambulated to room shower with RW with Lt hand orthosis with supervision.  Completed shower transfer and bathing at sit > stand level with supervision.  Pt utilized LUE at diminished level during bathing with washing hair and Rt side of body without dropping wash cloth.  Dressing completed with supervision from EOB with pt able to recall hemi technique when donning shirt and underwear and shorts.  Pt required initial setup of Lt shoe with AFO to increase success with donning Lt shoe.  Engaged in strengthening with LUE with use of theraputty, re-educated pt on use of putty and provided HEP to address all finger flexors and extensors as pt with concerns about strengthening and functional use of digits 4 and 5.  Pt reports friend to obtain tub bench for home tub/shower.  Pt reports no further questions or concerns, ready for d/c tomorrow.  Therapy Documentation Precautions:  Precautions Precautions: Fall Precaution Comments: impulsive, L hemi Restrictions Weight Bearing Restrictions: No Pain: Pain Assessment Pain Scale: 0-10 Pain Score: 2  Pain Type: Acute pain Pain Location: Head Pain Descriptors / Indicators: Headache Pain Intervention(s): Medication (See eMAR)  See Function Navigator for  Current Functional Status.   Therapy/Group: Individual Therapy  Rosalio LoudHOXIE, [name] 12/20/2017, 1:50 PM

## 2017-12-20 NOTE — Discharge Summary (Signed)
NAME: Zachary Deleon, Zachary J. MEDICAL RECORD [id number] ACCOUNT [id number] DATE OF BIRTH:03/14/1966 FAC[facility] LOCATION: [facility] PHYSICIAN:Zachary Wynn BankerKIRSTEINS, MD  DISCHARGE SUMMARY  DATE OF DISCHARGE:  12/21/2017  DISCHARGE DIAGNOSES: 1.  Right middle cerebral artery infarction as well as history of brain tumor many years ago with resection. 2.  Subcutaneous Lovenox for deep venous thrombosis prophylaxis. 3.  Pain management. 4.  Mood. 5.  Asymptomatic carotid stenosis. 6.  Hypertension. 7.  Diabetes mellitus. 8.  Tobacco abuse. 9.  Hyperlipidemia. 10.  Medical noncompliance. 11.  Morbid obesity.  Deleon COURSE:  [name] with history of COPD, tobacco abuse, carotid stenosis followed by vascular surgery at [facility], diabetes mellitus and brain tumor with resection 20 years ago.  He lives with his wife.   He used a cane prior to admission.  He presented to [facility] on [facility]2017 with left-sided weakness and slurred speech.  Urine drug screen negative.  Cranial CT scan showed postsurgical changes in the right cerebellar hemisphere, no acute  process.  The patient did not receive tPA.  MRI/MRA showed patchy areas of subacute infarction in the right posterior MCA territory.  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  Carotid Doppler showed right ICA  occlusion a greater than 70% left ICA.  Echocardiogram with ejection fraction of 60%, no wall motion abnormalities.  EEG negative for seizure.  Neurology service is consulted.  Maintained on aspirin and Plavix for CVA prophylaxis.  Subcutaneous Lovenox  for DVT prophylaxis.  The patient was admitted for comprehensive rehabilitation program.  PAST MEDICAL HISTORY:  See discharge diagnoses.  SOCIAL HISTORY:  Lives with wife.  Used a straight cane prior to admission.  FUNCTIONAL STATUS:  Upon admission to rehab services was max mod assist 4 feet rolling  walker, +2 physical assist sit to stand, max total assist with activities of daily living.  PHYSICAL EXAMINATION: VITAL SIGNS:  Blood pressure 145/84, pulse 78, temperature 97, respirations 16. GENERAL:  Alert male in no acute distress.  Oriented x3.  Speech mildly dysarthric but intelligible.  EOMs intact. NECK:  Supple, nontender, no JVD. CARDIOVASCULAR:  Rate controlled. ABDOMEN:  Soft, nontender, good bowel sounds. LUNGS:  Clear to auscultation without wheeze.  REHABILITATION Deleon COURSE:  The patient was admitted to inpatient rehabilitation services.  Therapies initiated on a 3-hour daily basis, consisting of physical therapy, occupational therapy, speech therapy and rehabilitation nursing.  The following  issues were addressed during patient's rehabilitation stay.  Pertaining to the patient's right MCA territory infarction, remained stable, maintained on aspirin and Plavix therapy.  He would follow up with neurology services.  Subcutaneous Lovenox for DVT  prophylaxis.  No bleeding episodes.  Noted the patient had had a brain tumor resection approximately 20 years ago at [facility].  Pain management with the use of Neurontin 100 mg 3 times daily as well as trial of Topamax for sinus headaches  post-CVA.  Mood history of depression.  Doing well with Prozac.  Emotional support provided.  Blood pressure is controlled with present regimen.  He would follow up with his primary MD.  Uncontrolled diabetes mellitus.  Hemoglobin A1c 11.4.  Insulin  therapy is directed with full diabetic teaching.  He did have a history of tobacco abuse.  Maintained on a Nicoderm patch as well as full counts regards to cessation of nicotine products.  It was questionable he would be compliant with these requests.  Morbid obesity, BMI of 37.38.  Dietary  followup.  Medical noncompliance.  Again, received counseling regards to maintaining medical regimen.  The patient received weekly collaborative  interdisciplinary team conferences to discuss estimated length of  stay, family teaching, any barriers to his discharge.  The patient transferred sit to stand supervision, ambulating rolling walker greater than 100 feet, working with standing balance, transferred to mat with supervision and rolling walker, minimal  verbal cues performed sit to stand with supervision, performing 10 squats with emphasis on balance.  He could gather his belongings for activities of daily living and homemaking.  It was advised no driving and plan discharge to home after family teaching  completed.  DISCHARGE MEDICATIONS:  Included Norvasc 10 mg p.o. daily, aspirin 325 mg p.o. daily, Lipitor 80 mg p.o. daily, Plavix 75 mg p.o. daily, Voltaren gel 2 grams 4 times a day to affected area, fenofibrate 160 mg p.o. daily, Prozac 20 mg p.o. daily,  Neurontin 100 mg p.o. t.i.d., NovoLog 10 units t.i.d., Lantus insulin 45 units at bedtime, Lidoderm patch as directed, Dulera 2 puffs twice daily, Nicoderm patch taper as directed, Topamax 25 mg p.o. b.i.d., Robaxin 500 mg p.o. every 8 hours as needed  for muscle spasms.  Seroquel 12.5 mg p.o. b.i.d. as needed, Ultram 50 mg p.o. every 6 hours as needed for pain.  His diet was a diabetic diet.  The patient would follow up with [facility] at the outpatient rehab service office as directed; [facility], neurology services call for appointment; Zach[facility] De[facility]on, medical management at [facility]; Dr.  Loma Deleon, follow up routinely for asymptomatic carotid stenosis.  SPECIAL INSTRUCTIONS:  No driving, no smoking, no alcohol.  TN/NUANCE D:12/20/2017 T:12/20/2017 JOB:[id number]

## 2017-12-20 NOTE — Progress Notes (Signed)
Physical Therapy Discharge Summary  Patient Details  Name: Zachary Deleon MRN: [id number] Date of Birth: 1966/04/14  Today's Date: 12/20/2017 PT Individual Time: 0802-0900 and 1119-1200 PT Individual Time Calculation (min): 58 min and 41 min    Patient has met 11 of 11 long term goals due to improved activity tolerance, improved balance, improved postural control, increased strength, increased range of motion, ability to compensate for deficits, functional use of  left upper extremity and left lower extremity, improved attention, improved awareness and improved coordination.  Patient to discharge at an ambulatory level supervision with RW, L hand orthosis, L AFO.   No family/caregivers present for hands-on training prior to pt's d/c.   Reasons goals not met: n/a  Recommendation:  Patient will benefit from ongoing skilled PT services in home health setting to continue to advance safe functional mobility, address ongoing impairments in strength, endurance, neuromuscular control, progress gait with LRAD, transfers, stair negotiation, safety awareness and minimize fall risk.  Equipment: recommending L AFO, L hand orthosis, double wheeled RW, 20x18 w/c  Reasons for discharge: treatment goals met  Patient/family agrees with progress made and goals achieved: Yes   Skilled PT Treatment: Treatment 1: Pt received in room & agreeable to tx. Pt with c/o pain in L shoulder & head - RN aware & administered meds. Pt required assistance to don L shoe/AFO but reports he has new AFO 2/2 original one causing blister on heel (received new one yesterday from [facility]). Educated pt to perform skin checks multiple times/day. Pt completes sit>stand and stand pivot transfers (bed<>w/c) with supervision and is able to manage L hand orthosis without assistance. Pt ambulates 29 ft with RW, L orthosis, and L AFO with close supervision, reporting fatigue at end of task. Pt completes bed mobility with mod I, gait  over uneven surface with steady assist, ramp with close supervision, and car transfer with supervision. Pt completes car transfer at low sedan simulated height (pt reports his wife's car is broken down but he will probably ride home in his daughter's car). Pt steps in with LLE before sitting on seat, even with education regarding need to sit then place LE in/out of car. Educated pt on need for close supervision at all times during functional mobility when he's home. No family present for caregiver training but pt reports his wife & daughters can provide necessary assistance. At end of session pt left sitting on EOB with alarm set & needs within reach. Pt reports a family friend plans to provide him equipment (w/c, tub bench, recliner).   Educated pt on stroke risk factors & lifestyle modifications.   Treatment 2: Pt received in recliner & agreeable to tx. Pt reports HA (6/10) & RN administered meds. Pt transferred to w/c via stand pivot with supervision. Transported pt to gym via w/c total assist. Pt negotiated 12 steps (3" + 6") with B or R rail with close supervision with initial cuing for compensatory pattern. Pt completed Berg Balance Test & scored 20/56; educated pt on interpretation of score & recommendations to increase safety (use L AFO, RW, & close supervision for all mobility) with pt verbalizing understanding. Patient demonstrates increased fall risk as noted by score of 20/56 on Berg Balance Scale.  (<36= high risk for falls, close to 100%; 37-45 significant >80%; 46-51 moderate >50%; 52-55 lower >25%). Pt voices no questions or concerns regarding d/c. At end of session pt left sitting on EOB with alarm set & all needs within reach.  PT Discharge Precautions/Restrictions Precautions Precautions: Fall Precaution Comments: impulsive, L hemi Restrictions Weight Bearing Restrictions: No  Vision/Perception  Pt wears glasses for reading only at baseline. No changes in vision  reported. Perception Perception: Impaired Inattention/Neglect: (decreased attention to L side of body 2/2 absent proprioception & sensation)   Cognition Overall Cognitive Status: History of cognitive impairments - at baseline Arousal/Alertness: Awake/alert Orientation Level: Oriented X4 Memory: Impaired Memory Impairment: Decreased recall of new information Problem Solving: Impaired Behaviors: Impulsive Safety/Judgment: Impaired  Sensation Sensation Light Touch: (absent sensation to light touch in LUE/LLE) Proprioception: Impaired Detail Proprioception Impaired Details: Absent LUE;Absent LLE Coordination Gross Motor Movements are Fluid and Coordinated: No Fine Motor Movements are Fluid and Coordinated: No  Motor  Motor Motor: Hemiplegia Motor - Discharge Observations: LUE/LLE hemiplegia, generalized weakness   Mobility Bed Mobility Bed Mobility: Rolling Right;Rolling Left;Supine to Sit;Sit to Supine(regular bed in rehab apartment) Rolling Right: Independent with assistive device Rolling Left: Independent with assistive device Supine to Sit: Independent with assistive device Sit to Supine: Independent with assistive device Transfers Transfers: Sit to Stand;Stand Pivot Transfers;Stand to Sit Sit to Stand: Supervision/Verbal cueing(armrests, RW) Stand to Sit: Supervision/Verbal cueing(armrests, RW) Stand Pivot Transfers: Supervision/Verbal cueing  Locomotion  Gait Ambulation: Yes Gait Assistance: Supervision/Verbal cueing Gait Distance (Feet): 100 Feet Assistive device: Rolling walker(L hand orthosis, L AFO) Gait Assistance Details: Verbal cues for precautions/safety;Verbal cues for technique Gait Gait: Yes Gait Pattern: Impaired Gait Pattern: Decreased stance time - left(decreased step length BLE, decreased stride length, L knee hyperextension mostly corrected with L AFO, decreased hip flexion LLE during swing phase, decreased weight shifting L) Stairs / Additional  Locomotion Stairs: Yes Stairs Assistance: Supervision/Verbal cueing Stair Management Technique: One rail Right;Two rails Number of Stairs: 12 Height of Stairs: (6" + 3") Ramp: Supervision/Verbal cueing(ambulatory with RW) Product manager Mobility: Yes Wheelchair Assistance: Supervision/Verbal cueing Wheelchair Propulsion: Right lower extremity;Right upper extremity(intermittent use of LLE) Wheelchair Parts Management: Supervision/cueing Distance: 150 ft    Trunk/Postural Assessment  Cervical Assessment Cervical Assessment: (forward head) Thoracic Assessment Thoracic Assessment: (rounded shoulders) Lumbar Assessment Lumbar Assessment: (posterior pelvic tilt) Postural Control Postural Control: Deficits on evaluation Righting Reactions: impaired Protective Responses: impaired   Balance Balance Balance Assessed: Yes Standardized Balance Assessment Standardized Balance Assessment: Berg Balance Test Berg Balance Test Sit to Stand: Able to stand  independently using hands Standing Unsupported: Able to stand 2 minutes with supervision Sitting with Back Unsupported but Feet Supported on Floor or Stool: Able to sit safely and securely 2 minutes Stand to Sit: Sits independently, has uncontrolled descent Transfers: Able to transfer with verbal cueing and /or supervision Standing Unsupported with Eyes Closed: Able to stand 10 seconds with supervision Standing Ubsupported with Feet Together: Needs help to attain position but able to stand for 30 seconds with feet together From Standing, Reach Forward with Outstretched Arm: Reaches forward but needs supervision From Standing Position, Pick up Object from Floor: Unable to try/needs assist to keep balance(picks up object, steady assist for balance & pt self selecting wide BOS) From Standing Position, Turn to Look Behind Over each Shoulder: Needs supervision when turning Turn 360 Degrees: Needs assistance while  turning Standing Unsupported, Alternately Place Feet on Step/Stool: Needs assistance to keep from falling or unable to try Standing Unsupported, One Foot in Front: Needs help to step but can hold 15 seconds Standing on One Leg: Unable to try or needs assist to prevent fall Total Score: 20   Extremity Assessment  RUE Assessment RUE Assessment: Within  Functional Limits  Per OT's assessment:  LUE Assessment LUE Assessment: Exceptions to [facility] Dt Passive Range of Motion (PROM) Comments: WFL Active Range of Motion (AROM) Comments: shoulder flexion grossly 120* against gravity, elbow and wrist WFL, grasp improving General Strength Comments: 3+/5 deltoid, 4/5 bicep and tricep LUE Body System: Neuro Brunstrum levels for arm and hand: Arm;Hand Brunstrum level for arm: Stage V Relative Independence from Synergy Brunstrum level for hand: Stage VI Isolated joint movements   RLE Assessment RLE Assessment: Within Functional Limits RLE Tone RLE Tone: Within Functional Limits LLE Assessment LLE Assessment: Exceptions to WFL(hip flexion = 3/5, knee flexion & extension grossly 4/5) LLE Tone LLE Tone: Within Functional Limits   See Function Navigator for Current Functional Status.  Waunita Schooner 12/20/2017, 12:05 PM

## 2017-12-20 NOTE — Progress Notes (Signed)
Speech Language Pathology Discharge Summary  Patient Details  Name: Zachary Deleon MRN: [id number] Date of Birth: 08/19/1965  Today's Date: 12/20/2017 SLP Individual Time: 1300-1345 SLP Individual Time Calculation (min): 45 min   Skilled Therapeutic Interventions:  Skilled treatment session focused on cognition goals. SLP facilitated session by providing supervision questions for anticipatory awareness. Pt is currently at cognitive baseline with no further skilled ST indicated at discharge.     Patient has met 3 of 3 long term goals.  Patient to discharge at overall Independent level.    Clinical Impression/Discharge Summary: Pt has made good progress and as a result he has met 3 of 3 LTGs and is considered to pt at cognitive baseline.   Care Partner:  Caregiver Able to Provide Assistance: Yes  Type of Caregiver Assistance: Physical  Recommendation:  None      Equipment:     Reasons for discharge: Discharged from hospital   Patient/Family Agrees with Progress Made and Goals Achieved: Yes   Function:  Eating Eating   Modified Consistency Diet: No Eating Assist Level: No help, No cues   Eating Set Up Assist For: Opening containers       Cognition Comprehension Comprehension assist level: Follows complex conversation/direction with extra time/assistive device  Expression   Expression assist level: Expresses complex ideas: With extra time/assistive device  Social Interaction Social Interaction assist level: Interacts appropriately with others with medication or extra time (anti-anxiety, antidepressant).  Problem Solving Problem solving assist level: Solves basic problems with no assist  Memory Memory assist level: Recognizes or recalls 90% of the time/requires cueing < 10% of the time     12/20/2017, 1:04 PM    

## 2017-12-20 NOTE — Discharge Summary (Signed)
Discharge summary job [phone]

## 2017-12-20 NOTE — Progress Notes (Signed)
Occupational Therapy Discharge Summary  Patient Details  Name: Zachary Deleon MRN: [id number] Date of Birth: [birth year]  Patient has met 12 of 12 long term goals due to improved activity tolerance, improved balance, postural control, ability to compensate for deficits, functional use of  LEFT upper and LEFT lower extremity, improved awareness and improved coordination.  Patient to discharge at overall Supervision level.  Patient's care partner is independent to provide the necessary physical and cognitive assistance at discharge.    Reasons goals not met: N/A  Recommendation:  Patient will benefit from ongoing skilled OT services in home health setting to continue to advance functional skills in the area of BADL and Reduce care partner burden.  Equipment: No equipment provided.  Family to purchase tub bench  Reasons for discharge: treatment goals met and discharge from hospital  Patient/family agrees with progress made and goals achieved: Yes  OT Discharge Precautions/Restrictions  Precautions Precautions: Fall Pain Pain Assessment Pain Scale: 0-10 Pain Score: 1  Pain Type: Acute pain Pain Location: Shoulder Pain Orientation: Left Pain Descriptors / Indicators: Aching Pain Intervention(s): Medication (See eMAR) ADL  See Function Navigator Vision Baseline Vision/History: Wears glasses Wears Glasses: Reading only Patient Visual Report: No change from baseline Vision Assessment?: No apparent visual deficits Cognition Overall Cognitive Status: History of cognitive impairments - at baseline Arousal/Alertness: Awake/alert Orientation Level: Oriented X4 Attention: Selective Selective Attention: Appears intact Memory: Impaired(reports at baseline) Awareness: Appears intact Safety/Judgment: Impaired Sensation Sensation Light Touch: Impaired Detail Light Touch Impaired Details: Impaired LUE;Absent LLE Proprioception: Impaired Detail Proprioception Impaired Details:  Impaired LLE;Impaired LUE Coordination Gross Motor Movements are Fluid and Coordinated: No Fine Motor Movements are Fluid and Coordinated: No Finger Nose Finger Test: able to complete with increased time and mild overshooting/dysmetria Extremity/Trunk Assessment RUE Assessment RUE Assessment: Within Functional Limits LUE Assessment LUE Assessment: Exceptions to [facility] Passive Range of Motion (PROM) Comments: WFL Active Range of Motion (AROM) Comments: shoulder flexion grossly 120* against gravity, elbow and wrist WFL, grasp improving General Strength Comments: 3+/5 deltoid, 4/5 bicep and tricep LUE Body System: Neuro Brunstrum levels for arm and hand: Arm;Hand Brunstrum level for arm: Stage V Relative Independence from Synergy Brunstrum level for hand: Stage VI Isolated joint movements   See Function Navigator for Current Functional Status.  Simonne Come 12/20/2017, 10:59 AM

## 2017-12-20 NOTE — Progress Notes (Signed)
Pt declined use of CPAP 

## 2017-12-20 NOTE — Progress Notes (Signed)
[place] PHYSICAL MEDICINE & REHABILITATION     PROGRESS NOTE  Subjective/Complaints:   Some chronic left hip pain after therapy , per PT did well with car transfer  ROS: - CP on left , no pain with exertion, no abd pain no N/V/D  Objective: Vital Signs: Blood pressure (!) 142/90, pulse (!) 57, temperature 98.1 F (36.7 C), temperature source Oral, resp. rate 18, height 6\' 2"  (1.88 m), weight 132.4 kg (291 lb 14.2 oz), SpO2 97 %. No results found. No results for input(s): WBC, HGB, HCT, PLT in the last 72 hours. No results for input(s): NA, K, CL, GLUCOSE, BUN, CREATININE, CALCIUM in the last 72 hours.  Invalid input(s): CO CBG (last 3)  Recent Labs    12/19/17 1705 12/19/17 2113 12/20/17 0619  GLUCAP 99 150* 123*    Wt Readings from Last 3 Encounters:  12/19/17 132.4 kg (291 lb 14.2 oz)  12/06/17 133.4 kg (294 lb)    Physical Exam:  BP (!) 142/90 (BP Location: Left Arm)   Pulse (!) 57   Temp 98.1 F (36.7 C) (Oral)   Resp 18   Ht 6\' 2"  (1.88 m)   Wt 132.4 kg (291 lb 14.2 oz)   SpO2 97%   BMI 37.48 kg/m  Constitutional: He appearswell-developed.No distress.obese HENT: Normocephalicand atraumatic.  Eyes:EOMare normal. No discharge. Cardiovascular:RRR. No JVD.Ecchymosis on left side ant ax line lower rib areaRespiratory:Effort normal. Clear. ZO:XWRU.[id number]. Musculoskeletal: Left upper extremity tenderness.pain with L shoulder ROM.  Pain to plap over the left side lower ribs orse at ant axillary line, low back paraspinal tenderness    Neurological: He isalertand oriented. Dysarthria Left facial weakness Motor: LUE: 4-/5 proximal to distal (improved) LLE: 4-/5 proximal to distal, trace ankle on left Absent LT sensation LLE,reduced LUE, + asterognosis RUE/RLE 5/5 proximal to distal. Skin: Skin iswarmand dry.  Psychiatric: He has anormal mood and affect. Hisbehavior is normal.  Assessment/Plan: 1. Functional deficits secondary to right  MCA territory infarction as well as history of brain tumor which require 3+ hours per day of interdisciplinary therapy in a comprehensive inpatient rehab setting. Physiatrist is providing close team supervision and 24 hour management of active medical problems listed below. Physiatrist and rehab team continue to assess barriers to discharge/monitor patient progress toward functional and medical goals.  Function:  Bathing Bathing position   Position: Shower  Bathing parts Body parts bathed by patient: Right arm, Left arm, Abdomen, Chest, Front perineal area, Buttocks, Right upper leg, Left upper leg, Right lower leg, Left lower leg Body parts bathed by helper: Back  Bathing assist Assist Level: Touching or steadying assistance(Pt > 75%)      Upper Body Dressing/Undressing Upper body dressing   What is the patient wearing?: Pull over shirt/dress     Pull over shirt/dress - Perfomed by patient: Thread/unthread right sleeve, Put head through opening, Pull shirt over trunk, Thread/unthread left sleeve Pull over shirt/dress - Perfomed by helper: Thread/unthread left sleeve        Upper body assist Assist Level: Supervision or verbal cues, Set up   Set up : To obtain clothing/put away  Lower Body Dressing/Undressing Lower body dressing   What is the patient wearing?: Pants, Non-skid slipper socks, Underwear Underwear - Performed by patient: Thread/unthread right underwear leg, Thread/unthread left underwear leg, Pull underwear up/down Underwear - Performed by helper: Pull underwear up/down Pants- Performed by patient: Thread/unthread right pants leg, Thread/unthread left pants leg, Pull pants up/down Pants- Performed by helper: Thread/unthread left pants  leg, Pull pants up/down Non-skid slipper socks- Performed by patient: Don/doff right sock, Don/doff left sock Non-skid slipper socks- Performed by helper: Don/doff right sock, Don/doff left sock Socks - Performed by patient: Don/doff  right sock, Don/doff left sock   Shoes - Performed by patient: Don/doff right shoe, Don/doff left shoe Shoes - Performed by helper: Fasten right, Fasten left AFO - Performed by patient: Don/doff left AFO AFO - Performed by helper: Don/doff left AFO      Lower body assist Assist for lower body dressing: Touching or steadying assistance (Pt > 75%)      Toileting Toileting   Toileting steps completed by patient: Performs perineal hygiene, Adjust clothing prior to toileting, Adjust clothing after toileting Toileting steps completed by helper: Adjust clothing after toileting Toileting Assistive Devices: Grab bar or rail  Toileting assist Assist level: Supervision or verbal cues   Transfers Chair/bed transfer   Chair/bed transfer method: Stand pivot, Ambulatory Chair/bed transfer assist level: Supervision or verbal cues Chair/bed transfer assistive device: Armrests, Walker, Orthosis     Locomotion Ambulation     Max distance: 100 ft Assist level: Touching or steadying assistance (Pt > 75%)   Wheelchair   Type: Manual Max wheelchair distance: 150 Assist Level: Supervision or verbal cues  Cognition Comprehension Comprehension assist level: Follows complex conversation/direction with extra time/assistive device  Expression Expression assist level: Expresses complex ideas: With extra time/assistive device  Social Interaction Social Interaction assist level: Interacts appropriately with others with medication or extra time (anti-anxiety, antidepressant).  Problem Solving Problem solving assist level: Solves basic 90% of the time/requires cueing < 10% of the time  Memory Memory assist level: Recognizes or recalls 90% of the time/requires cueing < 10% of the time    Medical Problem List and Plan: 1.Left side weakness with dysarthriasecondary to right MCA territory infarction as well as history of brain tumor many years ago with [facility]  Cont CIR, PT, OT, SLP  -should be ready for D/C in am  2. DVT Prophylaxis/Anticoagulation: Subcutaneous Lovenox. Monitor for any bleeding episodes 3. Pain Management:Neurontin 100 mg 3 times daily, Ultram as needed, post CVA HA trial topiramate increase to BID 4. Mood:Prozac 20 mg daily, reduce Seroquel to  12.5 mg twice daily as needed 5. Neuropsych: This patientiscapable of making decisions on hisown behalf. 6. Skin/Wound Care:Routine skin checks 7. Fluids/Electrolytes/Nutrition:Routine in and outs 8.Asymptomatic carotid stenosis. [facility].Velasquez Derrell Lollingamirez. 9.Hypertension. Monitor with increased mobility. Patient on Norvasc 10 mg daily, Zestoretic 20-12.5 mg daily prior to admission. Resume as needed   Vitals:   12/19/17 2000 12/20/17 0425  BP:  (!) 142/90  Pulse:  (!) 57  Resp:  18  Temp:  98.1 F (36.7 C)  SpO2: 95% 97%  Controlled amlodipine 10mg  started 7/11, recheck in am 10.Diabetes mellitus. Hemoglobin A1c 11.4.  -NovoLog 10 units 3 times daily, Lantus insulin 45 units daily. -Check blood sugars before meals and at bedtime. Diabetic teaching   CBG (last 3)  Recent Labs    12/19/17 1705 12/19/17 2113 12/20/17 0619  GLUCAP 99 150* 123*  good in hospital control 7/11 11.Tobacco abuse. NicoDerm patch. Counseling 12.Hyperlipidemia. Lipitor/fenofibrate 13.Medical noncompliance. Counseling 14.Morbid Obesity. BMI 37.38. Dietary follow-up  16. Hypomagnesemia  Improved to 1.7 7/3  17. Thrombocytopenia  Platelets 138 on 6/28, stable at 131K on 7/1   Continue to monitor- no sign of hemorrhage 18.  Loose stool  LOS (Days) 13 A FACE TO FACE EVALUATION  WAS PERFORMED  Zachary Deleon 12/20/2017 8:49 AM

## 2017-12-21 LAB — CREATININE, SERUM
Creatinine, Ser: 1.08 mg/dL (ref 0.61–1.24)
GFR calc non Af Amer: >60 mL/min (ref >60)

## 2017-12-21 LAB — GLUCOSE, CAPILLARY: GLUCOSE-CAPILLARY: 113 mg/dL — AB (ref 70–99)

## 2017-12-21 NOTE — Progress Notes (Signed)
Pt d/c with personal belongings, discharge instructions provided, pt and family verbalized understanding of d/c instructions and f/u appts.

## 2017-12-21 NOTE — Discharge Instructions (Signed)
Inpatient Rehab Discharge Instructions  [name] Discharge date and time: No discharge date for patient encounter.   Activities/Precautions/ Functional Status: Activity: activity as tolerated Diet: diabetic diet Wound Care: none needed Functional status:  ___ No restrictions     ___ Walk up steps independently ___ 24/7 supervision/assistance   ___ Walk up steps with assistance ___ Intermittent supervision/assistance  ___ Bathe/dress independently ___ Walk with walker     _x__ Bathe/dress with assistance ___ Walk Independently    ___ Shower independently ___ Walk with assistance    ___ Shower with assistance ___ No alcohol     ___ Return to work/school ________  Special Instructions: No driving smoking or alcohol    COMMUNITY REFERRALS UPON DISCHARGE:    Home Health:   PT, OT, RN  Agency:[facility] [phone]   Date of last service:12/21/2017  Medical Equipment/Items Ordered:WIDE ROLLING WALKER & BEDSIDE COMMODE  Agency/Supplier:[facility]   [phone]   GENERAL COMMUNITY RESOURCES FOR PATIENT/FAMILY: Support Groups:[facility] SUPPORT GROUP THE SECOND Thursday @ 6:00-7:00 PM ON THE REHAB UNIT QUESTIONS CONTACT [place] [phone]  STROKE/TIA DISCHARGE INSTRUCTIONS SMOKING Cigarette smoking nearly doubles your risk of having a stroke & is the single most alterable risk factor  If you smoke or have smoked in the last 12 months, you are advised to quit smoking for your health.  Most of the excess cardiovascular risk related to smoking disappears within a year of stopping.  Ask you doctor about anti-smoking medications  [place] Quit Line: 1-800-QUIT NOW  Free Smoking Cessation Classes [phone]  CHOLESTEROL Know your levels; limit fat & cholesterol in your diet  Lipid Panel     Component Value Date/Time   CHOL 279 (H) 12/03/2017 0601   TRIG 1,256 (H) 12/03/2017 0601   HDL NOT REPORTED DUE TO HIGH TRIGLYCERIDES 12/03/2017 0601   CHOLHDL NOT  REPORTED DUE TO HIGH TRIGLYCERIDES 12/03/2017 0601   VLDL UNABLE TO CALCULATE IF TRIGLYCERIDE OVER 400 mg/dL 57/84/696206/24/2019 95280601   LDLCALC UNABLE TO CALCULATE IF TRIGLYCERIDE OVER 400 mg/dL 41/32/440106/24/2019 [id number]      Many patients benefit from treatment even if their cholesterol is at goal.  Goal: Total Cholesterol (CHOL) less than 160  Goal:  Triglycerides (TRIG) less than 150  Goal:  HDL greater than 40  Goal:  LDL (LDLCALC) less than 100   BLOOD PRESSURE American Stroke Association blood pressure target is less that 120/80 mm/Hg  Your discharge blood pressure is:  BP: (!) 166/93  Monitor your blood pressure  Limit your salt and alcohol intake  Many individuals will require more than one medication for high blood pressure  DIABETES (A1c is a blood sugar average for last 3 months) Goal HGBA1c is under 7% (HBGA1c is blood sugar average for last 3 months)  Diabetes:   Lab Results  Component Value Date   HGBA1C 11.4 (H) 12/03/2017     Your HGBA1c can be lowered with medications, healthy diet, and exercise.  Check your blood sugar as directed by your physician  Call your physician if you experience unexplained or low blood sugars.  PHYSICAL ACTIVITY/REHABILITATION Goal is 30 minutes at least 4 days per week  Activity: Increase activity slowly, Therapies: Physical Therapy: Home Health Return to work:   Activity decreases your risk of heart attack and stroke and makes your heart stronger.  It helps control your weight and blood pressure; helps you relax and can improve your mood.  Participate in a regular exercise program.  Talk with your  doctor about the best form of exercise for you (dancing, walking, swimming, cycling).  DIET/WEIGHT Goal is to maintain a healthy weight  Your discharge diet is:  Diet Order           Diet renal/carb modified with fluid restriction Fluid restriction: 2000 mL Fluid; Room service appropriate? Yes; Fluid consistency: Thin  Diet effective now            liquids Your height is:  Height: 6\' 2"  (188 cm) Your current weight is: Weight: 132 kg (291 lb 1.6 oz) Your Body Mass Index (BMI) is:  BMI (Calculated): 37.36  Following the type of diet specifically designed for you will help prevent another stroke.  Your goal weight range is:    Your goal Body Mass Index (BMI) is 19-24.  Healthy food habits can help reduce 3 risk factors for stroke:  High cholesterol, hypertension, and excess weight.  RESOURCES Stroke/Support Group:  Call [phone]   STROKE EDUCATION PROVIDED/REVIEWED AND GIVEN TO PATIENT Stroke warning signs and symptoms How to activate emergency medical system (call 911). Medications prescribed at discharge. Need for follow-up after discharge. Personal risk factors for stroke. Pneumonia vaccine given:  Flu vaccine given:  My questions have been answered, the writing is legible, and I understand these instructions.  I will adhere to these goals & educational materials that have been provided to me after my discharge from the hospital.      My questions have been answered and I understand these instructions. I will adhere to these goals and the provided educational materials after my discharge from the hospital.  Patient/Caregiver Signature _______________________________ Date __________  Clinician Signature _______________________________________ Date __________  Please bring this form and your medication list with you to all your follow-up doctor's appointments.

## 2017-12-21 NOTE — Progress Notes (Signed)
[place] PHYSICAL MEDICINE & REHABILITATION     PROGRESS NOTE  Subjective/Complaints:   Discussed PCP f/u , [facility] but would like someone to see him closer to home ? [place] area  ROS: - No CP, no pain with exertion, no abd pain no N/V/D  Objective: Vital Signs: Blood pressure (!) 151/84, pulse 63, temperature 97.9 F (36.6 C), temperature source Oral, resp. rate 18, height 6\' 2"  (1.88 m), weight 132.4 kg (291 lb 14.2 oz), SpO2 98 %. No results found. No results for input(s): WBC, HGB, HCT, PLT in the last 72 hours. Recent Labs    12/21/17 0459  CREATININE 1.08   CBG (last 3)  Recent Labs    12/20/17 1641 12/20/17 2108 12/21/17 0640  GLUCAP 134* 106* 113*    Wt Readings from Last 3 Encounters:  12/19/17 132.4 kg (291 lb 14.2 oz)  12/06/17 133.4 kg (294 lb)    Physical Exam:  BP (!) 151/84 (BP Location: Right Arm)   Pulse 63   Temp 97.9 F (36.6 C) (Oral)   Resp 18   Ht 6\' 2"  (1.88 m)   Wt 132.4 kg (291 lb 14.2 oz)   SpO2 98%   BMI 37.48 kg/m  Constitutional: He appearswell-developed.No distress.obese HENT: Normocephalicand atraumatic.  Eyes:EOMare normal. No discharge. Cardiovascular:RRR. No JVD.Ecchymosis on left side ant ax line lower rib areaRespiratory:Effort normal. Clear. WU:[id number]:Soft.nondistended. Musculoskeletal: Left upper extremity tenderness.pain with L shoulder ROM.  Pain to plap over the left side lower ribs orse at ant axillary line, low back paraspinal tenderness    Neurological: He isalertand oriented. Dysarthria Left facial weakness Motor: LUE: 4-/5 proximal to distal (improved) LLE: 4-/5 proximal to distal, trace ankle on left Absent LT sensation LLE,reduced LUE, + asterognosis RUE/RLE 5/5 proximal to distal. Skin: Skin iswarmand dry.  Psychiatric: He has anormal mood and affect. Hisbehavior is normal.  Assessment/Plan: 1. 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 Stable  for D/C today F/u PCP in 3-4 weeks F/u PM&R 2 weeks See D/C summary See D/C instructions Function:  Bathing Bathing position   Position: Shower  Bathing parts Body parts bathed by patient: Right arm, Left arm, Abdomen, Chest, Front perineal area, Buttocks, Right upper leg, Left upper leg, Right lower leg, Left lower leg, Back Body parts bathed by helper: Back  Bathing assist Assist Level: Supervision or verbal cues      Upper Body Dressing/Undressing Upper body dressing   What is the patient wearing?: Pull over shirt/dress     Pull over shirt/dress - Perfomed by patient: Thread/unthread right sleeve, Put head through opening, Pull shirt over trunk, Thread/unthread left sleeve Pull over shirt/dress - Perfomed by helper: Thread/unthread left sleeve        Upper body assist Assist Level: Set up   Set up : To obtain clothing/put away  Lower Body Dressing/Undressing Lower body dressing   What is the patient wearing?: Underwear, Pants, Socks, Shoes, AFO Underwear - Performed by patient: Thread/unthread right underwear leg, Thread/unthread left underwear leg, Pull underwear up/down Underwear - Performed by helper: Pull underwear up/down Pants- Performed by patient: Thread/unthread right pants leg, Thread/unthread left pants leg, Pull pants up/down Pants- Performed by helper: Thread/unthread left pants leg, Pull pants up/down Non-skid slipper socks- Performed by patient: Don/doff right sock, Don/doff left sock Non-skid slipper socks- Performed by helper: Don/doff right sock, Don/doff left sock Socks - Performed by patient: Don/doff right sock, Don/doff left sock   Shoes - Performed by patient: Don/doff  right shoe, Don/doff left shoe, Fasten right, Fasten left Shoes - Performed by helper: Fasten right, Fasten left AFO - Performed by patient: Don/doff left AFO AFO - Performed by helper: Don/doff left AFO      Lower body assist Assist for lower body dressing: Supervision or verbal  cues      Toileting Toileting   Toileting steps completed by patient: Adjust clothing prior to toileting, Adjust clothing after toileting, Performs perineal hygiene Toileting steps completed by helper: Adjust clothing after toileting Toileting Assistive Devices: Grab bar or rail  Toileting assist Assist level: Supervision or verbal cues   Transfers Chair/bed transfer   Chair/bed transfer method: Stand pivot Chair/bed transfer assist level: Supervision or verbal cues Chair/bed transfer assistive device: Armrests, Walker, Orthosis     Locomotion Ambulation     Max distance: 93 ft  Assist level: Supervision or verbal cues   Wheelchair   Type: Manual Max wheelchair distance: 150 ft  Assist Level: Supervision or verbal cues  Cognition Comprehension Comprehension assist level: Follows complex conversation/direction with extra time/assistive device  Expression Expression assist level: Expresses complex ideas: With extra time/assistive device  Social Interaction Social Interaction assist level: Interacts appropriately with others with medication or extra time (anti-anxiety, antidepressant).  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 DVT Prophylaxis/Anticoagulation: Subcutaneous Lovenox. Monitor for any bleeding episodes 3. Pain Management:Neurontin 100 mg 3 times daily, Ultram as needed, post CVA HA trial topiramate increase to BID 4. Mood:Prozac 20 mg daily, reduce Seroquel to  12.5 mg twice daily as needed 5. Neuropsych: This patientiscapable of making decisions on hisown behalf. 6. Skin/Wound Care:Routine skin  checks 7. Fluids/Electrolytes/Nutrition:Routine in and outs 8.Asymptomatic carotid stenosis. Patient [facility] Dr.Velasquez Derrell Lolling. 9.Hypertension. Monitor with increased mobility. Patient on Norvasc 10 mg daily, Zestoretic 20-12.5 mg daily prior to admission. Resume as needed   Vitals:   12/21/17 0520 12/21/17 0800  BP: (!) 151/84   Pulse: 63   Resp: 18   Temp: 97.9 F (36.6 C)   SpO2: 97% 98%   amlodipine 10mg  started 7/11, should drop in the next wk, f/u with PCP as OP 10.Diabetes mellitus. Hemoglobin A1c 11.4.  -NovoLog 10 units 3 times daily, Lantus insulin 45 units daily. -Check blood sugars before meals and at bedtime. Diabetic teaching   CBG (last 3)  Recent Labs    12/20/17 1641 12/20/17 2108 12/21/17 0640  GLUCAP 134* 106* 113*  good in hospital control 7/11 11.Tobacco abuse. NicoDerm patch. Counseling 12.Hyperlipidemia. Lipitor/fenofibrate 13.Medical noncompliance. Counseling 14.Morbid Obesity. BMI 37.38. Dietary follow-up  16. Hypomagnesemia  Improved to 1.7 7/3  17. Thrombocytopenia  Platelets 138 on 6/28, stable at 131K on 7/1   Continue to monitor- no sign of hemorrhage 18.  Loose stool  LOS (Days) 14 A FACE TO FACE EVALUATION WAS PERFORMED  Erick Colace 12/21/2017 8:44 AM

## 2017-12-21 NOTE — Progress Notes (Signed)
Social Work  Discharge Note  The overall goal for the admission was met for:   Discharge location: Yes-HOME WITH WIFE WHO CAN PROVIDE 24 HR SUPERVISION  Length of Stay: Yes-14 DAYS  Discharge activity level: Yes-SUPERVISION LEVEL  Home/community participation: Yes  Services provided included: MD, RD, PT, OT, SLP, RN, CM, TR, Pharmacy, Neuropsych and SW  Financial Services: Medicare  Follow-up services arranged: Home Health: [facility]-PT,OT,RN, DME: [facility] and Patient/Family has no preference for HH/DME agencies  Comments (or additional information):PT DID WELL AND REACHED MOD/I-SUPERVISION LEVEL GOALS. NEEDS TO SLOW DOWN DUE TO AT TIMES IMPULSIVE AND HIGH RISK TO FALL. PT TENDS TO DO WHAT HE WANTS EVEN IF NOT THE BEST DECISION  Patient/Family verbalized understanding of follow-up arrangements: Yes  Individual responsible for coordination of the follow-up plan: [name]-WIFE AND PATIENT  Confirmed correct DME delivered: [name] 12/21/2017    [name]

## 2017-12-25 ENCOUNTER — Telehealth: Payer: Self-pay

## 2017-12-25 NOTE — Telephone Encounter (Signed)
Transitional Care call  Patient name: Zachary Deleon(Zachary Deleon) DOB: (11-10-1955) 1. Are you/is patient experiencing any problems since coming home? (NO) a. Are there any questions regarding any aspect of care? (NO) 2. Are there any questions regarding medications administration/dosing? (NO) a. Are meds being taken as prescribed? (YES) b. "Patient should review meds with caller to confirm"  3. Have there been any falls? (NO) 4. Has Home Health been to the house and/or have they contacted you? (YES) a. If not, have you tried to contact them? (NA) b. Can we help you contact them? (NA) 5. Are bowels and bladder emptying properly? (YES) a. Are there any unexpected incontinence issues? (NO) b. If applicable, is patient following bowel/bladder programs? (NA) 6. Any fevers, problems with breathing, unexpected pain? (NO) 7. Are there any skin problems or new areas of breakdown? (NO) 8. Has the patient/family member arranged specialty MD follow up (ie cardiology/neurology/renal/surgical/etc.)?  (NO) a. Can we help arrange? (NO) 9. Does the patient need any other services or support that we can help arrange? (NO) 10. Are caregivers following through as expected in assisting the patient? (NO) 11. Has the patient quit smoking, drinking alcohol, or using drugs as recommended? (NA)  Appointment date/time (01-01-2018/230pm), arrive time (200pm) and who it is with here (Dr. Wynn BankerKirsteins) [phone]

## 2017-12-26 ENCOUNTER — Telehealth: Payer: Self-pay | Admitting: Registered Nurse

## 2017-12-26 ENCOUNTER — Telehealth: Payer: Self-pay

## 2017-12-26 NOTE — Telephone Encounter (Signed)
Received a verbal message from Bruce CMA around 4:30 p.m. Stating when he spoke to Ms. Dor, Mr. Abbe AmsterdamHopkins didn't have any insulin and he instructed her to follow up with his PCP.   This provider placed a call to Ms. Shoun at 7:00 p.m on 12/25/2017. To follow up on the above.  Ms. Abbe AmsterdamHopkins states she wasn't able to pick up the Novalog insulin due to cost, she had Lantus insulin  ( Pen) at home already and has been giving Mr. Saltsman his insulin. I asked Ms. Dobbin  What Mr. Zaucha   blood sugars have been running, she reports his glucometer isn't working. Hospital glucose  was reviewed, she states she will be going to the Health department in [place] in the morning, in the past she was able to receive assistance with his medication. Also reports [facility] to [facility] for assistance.  Mr. Abbe AmsterdamHopkins has Medicare A only, she states, she  was instructed to apply for Medicare D, to cover his medications. She verbalizes understanding. In the past she reports he was on medicaid.  We reviewed Mr. Abbe AmsterdamHopkins medications, she was able to purchase all medications except his albuterol inhaler, Advair inhaler, fenofibrate, Novolog and Lidoderm patches, due to financial hardship. The pharmacist was very helpful she reports and helped with discount cards, the medications cost her $200.00 dollars she states.  Also stated Mr. Abbe AmsterdamHopkins had nicotine patches at home and she has resume his nicotine patches.  She was also instructed to call her PCP to see if they have samples and are able to assist in anyway. She has a scheduled appointment on 12/27/2017.   Ms. Abbe AmsterdamHopkins was instructed to call office in the morning with an update, this provider will send an e-mail to the social worker to assist with the above and also inquire about assistance for a glucometer. She verbalizes understanding.

## 2017-12-26 NOTE — Telephone Encounter (Signed)
Pt wife called stating she went to the Health Department today and they are going to help with his medication. Also they will be calling or sending for more information on patient.

## 2017-12-26 NOTE — Telephone Encounter (Signed)
Return Ms. Ognibene call, left message.

## 2017-12-27 ENCOUNTER — Telehealth: Payer: Self-pay | Admitting: Physical Medicine & Rehabilitation

## 2017-12-27 NOTE — Telephone Encounter (Signed)
Patients wife Zachary Deleon called and needs ET to fax document to Health Department in [place] for glucometer if it was not done - no number to HD left.  Patients number [phone]

## 2017-12-27 NOTE — Telephone Encounter (Signed)
Return Zachary Deleon call, no answer. Left message to return the call. Also let her know we don't have the Health Department Fax number.

## 2017-12-31 ENCOUNTER — Telehealth: Payer: Self-pay

## 2017-12-31 NOTE — Telephone Encounter (Signed)
Amy,PT/[facility] called requesting orders for PT for 3wk3 transfer, gait and fall prevention. Verbal orders given per discharge summary.

## 2018-01-01 ENCOUNTER — Encounter: Payer: Medicare Other | Attending: Physical Medicine & Rehabilitation

## 2018-01-01 ENCOUNTER — Encounter: Payer: Self-pay | Admitting: Physical Medicine & Rehabilitation

## 2018-01-01 ENCOUNTER — Ambulatory Visit (HOSPITAL_BASED_OUTPATIENT_CLINIC_OR_DEPARTMENT_OTHER): Payer: Medicare Other | Admitting: Physical Medicine & Rehabilitation

## 2018-01-01 VITALS — BP 125/82 | HR 73 | Ht 72.0 in | Wt 280.0 lb

## 2018-01-01 DIAGNOSIS — G8194 Hemiplegia, unspecified affecting left nondominant side: Secondary | ICD-10-CM | POA: Diagnosis not present

## 2018-01-01 DIAGNOSIS — Z86718 Personal history of other venous thrombosis and embolism: Secondary | ICD-10-CM | POA: Diagnosis not present

## 2018-01-01 DIAGNOSIS — E1169 Type 2 diabetes mellitus with other specified complication: Secondary | ICD-10-CM | POA: Diagnosis present

## 2018-01-01 DIAGNOSIS — Z87891 Personal history of nicotine dependence: Secondary | ICD-10-CM | POA: Insufficient documentation

## 2018-01-01 DIAGNOSIS — I10 Essential (primary) hypertension: Secondary | ICD-10-CM | POA: Insufficient documentation

## 2018-01-01 DIAGNOSIS — M1990 Unspecified osteoarthritis, unspecified site: Secondary | ICD-10-CM | POA: Diagnosis not present

## 2018-01-01 DIAGNOSIS — Z6837 Body mass index (BMI) 37.0-37.9, adult: Secondary | ICD-10-CM | POA: Insufficient documentation

## 2018-01-01 DIAGNOSIS — I63511 Cerebral infarction due to unspecified occlusion or stenosis of right middle cerebral artery: Secondary | ICD-10-CM

## 2018-01-01 DIAGNOSIS — Z8673 Personal history of transient ischemic attack (TIA), and cerebral infarction without residual deficits: Secondary | ICD-10-CM | POA: Diagnosis not present

## 2018-01-01 DIAGNOSIS — J449 Chronic obstructive pulmonary disease, unspecified: Secondary | ICD-10-CM | POA: Diagnosis not present

## 2018-01-01 DIAGNOSIS — E669 Obesity, unspecified: Secondary | ICD-10-CM

## 2018-01-01 NOTE — Progress Notes (Signed)
Subjective:    Patient ID: Zachary Deleon, male    DOB: [birth year], 52 y.o.   MRN: [id number] 52 year old right-handed male with history of COPD, tobacco abuse, carotid stenosis followed by vascular surgery at [facility], diabetes mellitus and brain tumor with resection 20 years ago.  He lives with his wife.   He used a cane prior to admission.  He presented to [facility] on 12/02/2017 with left-sided weakness and slurred speech.  Urine drug screen negative.  Cranial CT scan showed postsurgical changes in the right cerebellar hemisphere, no acute  process.  The patient did not receive tPA.  MRI/MRA showed patchy areas of subacute infarction in the right posterior MCA territory.  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  Carotid Doppler showed right ICA  occlusion a greater than 70% left ICA.  Echocardiogram with ejection fraction of 60%, no wall motion abnormalities.  EEG negative for seizure.  Neurology service is consulted.  Maintained on aspirin and Plavix   Discharge from inpatient stroke rehabilitation at [facility] 12/21/2017. Transitional care call completed 12/25/2017 HPI  OT and PT twice a week each, balance and incoordination Fell once onto the bed, leg slid out on him once Uses walker to ambulate, occ uses cane in house Pt is able to dress self except pulling up pants on Left side Took a shower with shower chair, needed help with washing peri area Still has tingling and numbness Left arm  Has seen primary care physician Dr. Hollice Espy.  Patient has questions about glucometer as well as insulin cost and other options.  Complains of left shoulder pain which radiates into the upper arm. Pain Inventory Average Pain 4 Pain Right Now 8 My pain is intermittent, tingling and aching  In the last 24 hours, has pain interfered with the following? General activity 2 Relation with others 0 Enjoyment of life 2 What TIME of day is  your pain at its worst? all Sleep (in general) Poor  Pain is worse with: some activites Pain improves with: medication Relief from Meds: 7  Mobility walk with assistance use a walker ability to climb steps?  yes do you drive?  no  Function disabled: date disabled . retired I need assistance with the following:  dressing, bathing, meal prep, household duties and shopping  Neuro/Psych weakness numbness tremor tingling trouble walking spasms dizziness depression anxiety  Prior Studies Any changes since last visit?  no  Physicians involved in your care Any changes since last visit?  no   History reviewed. No pertinent family history. Social History   Socioeconomic History  . Marital status: Married    Spouse name: Not on file  . Number of children: Not on file  . Years of education: Not on file  . Highest education level: Not on file  Occupational History  . Not on file  Social Needs  . Financial resource strain: Not on file  . Food insecurity:    Worry: Not on file    Inability: Not on file  . Transportation needs:    Medical: Not on file    Non-medical: Not on file  Tobacco Use  . Smoking status: Former Games developer  . Smokeless tobacco: Never Used  Substance and Sexual Activity  . Alcohol use: Not Currently  . Drug use: Not Currently  . Sexual activity: Not on file  Lifestyle  . Physical activity:    Days per week: Not on file  Minutes per session: Not on file  . Stress: Not on file  Relationships  . Social connections:    Talks on phone: Not on file    Gets together: Not on file    Attends religious service: Not on file    Active member of club or organization: Not on file    Attends meetings of clubs or organizations: Not on file    Relationship status: Not on file  Other Topics Concern  . Not on file  Social History Narrative  . Not on file   Past Surgical History:  Procedure Laterality Date  . BRAIN SURGERY    . BRAIN TUMOR EXCISION       [facility]  . CATARACT EXTRACTION     Past Medical History:  Diagnosis Date  . Arthritis   . COPD (chronic obstructive pulmonary disease) (HCC)   . Diabetes mellitus without complication (HCC)   . DVT (deep venous thrombosis) (HCC)   . Hypertension   . Seizures (HCC)   . Stroke (HCC) 11/2017   Ht 6' (1.829 m)   Wt 280 lb (127 kg)   BMI 37.97 kg/m   Opioid Risk Score:   Fall Risk Score:  `1  Depression screen PHQ 2/9  No flowsheet data found.   Review of Systems  Constitutional: Positive for diaphoresis and unexpected weight change.  HENT: Negative.   Eyes: Negative.   Respiratory: Positive for apnea.   Cardiovascular: Negative.   Gastrointestinal: Negative.   Endocrine: Negative.   Genitourinary: Positive for difficulty urinating and dysuria.  Musculoskeletal: Positive for arthralgias, gait problem and myalgias.  Skin: Negative.   Allergic/Immunologic: Negative.   Neurological: Positive for dizziness, tremors and numbness.  Hematological: Negative.   Psychiatric/Behavioral: Positive for dysphoric mood. The patient is nervous/anxious.   All other systems reviewed and are negative.      Objective:   Physical Exam  Constitutional: He is oriented to person, place, and time. He appears well-developed and well-nourished. No distress.  HENT:  Head: Normocephalic and atraumatic.  Eyes: Pupils are equal, round, and reactive to light. EOM are normal.  Neck: Normal range of motion.  Cardiovascular: Normal rate, regular rhythm and normal heart sounds.  No murmur heard. Pulmonary/Chest: Effort normal and breath sounds normal. No respiratory distress.  Abdominal: Soft. Bowel sounds are normal. He exhibits no distension.  Musculoskeletal:  Left shoulder has pain with abduction at approximately 90 degrees.  Negative drop arm test  Neurological: He is alert and oriented to person, place, and time. A sensory deficit is present. Gait abnormal.  Reduced sensation  to light touch in the entire left upper extremity.  Also decreased pinprick. Left lower extremity light touch partial preservation. Right-sided upper and lower extremity sensation is normal Motor strength is 5/5 in the right deltoid, bicep, tricep, hip flexor, knee extensor, ankle dorsiflexor 4/5 in the left deltoid, bicep and tricep grip, extensor,. Patient is in a wheelchair, ambulation not tested secondary to no walker.    Skin: Skin is warm and dry.  Psychiatric: His speech is normal and behavior is normal. His affect is inappropriate.  Nursing note and vitals reviewed.         Assessment & Plan:  #1.  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  He does not have a significant left neglect. He would benefit from continued home health PT OT. Physical medicine rehab follow-up in 1 month  2.  Subacromial impingement  syndrome.  I explained that this is common in patients with shoulder weakness after stroke.  He is not a good candidate for nonsteroidal anti-inflammatories taken on a p.o. basis. We will do corticosteroid injections in the subacromial space today.  Given samples of Flector patch to be cut into strips and placed over the subacromial area as discussed with patient's wife.  Lot #[id number], total of 4 patches dispensed  Shoulder injection LEFT subacromial   Indication: Left shoulder pain not relieved by medication management and other conservative care.  Informed consent was obtained after describing risks and benefits of the procedure with the patient, this includes bleeding, bruising, infection and medication side effects. The patient wishes to proceed and has given written consent. Patient was placed in a seated position. The left shoulder was marked and prepped with betadine in the subacromial area. A 25-gauge 1-1/2 inch needle was inserted into the subacromial area. After negative draw back for  blood, a solution containing 1 mL of 6 mg per ML betamethasone and 4 mL of 1% lidocaine was injected. A band aid was applied. The patient tolerated the procedure well. Post procedure instructions were given.  3.  Diabetes mellitus this is 1 of his risk factors for his stroke, follow-up with primary care to discuss options that may help contain the cost of his diabetic management

## 2018-01-01 NOTE — Patient Instructions (Signed)
Shoulder Exercises Ask your health care provider which exercises are safe for you. Do exercises exactly as told by your health care provider and adjust them as directed. It is normal to feel mild stretching, pulling, tightness, or discomfort as you do these exercises, but you should stop right away if you feel sudden pain or your pain gets worse.Do not begin these exercises until told by your health care provider. RANGE OF MOTION EXERCISES These exercises warm up your muscles and joints and improve the movement and flexibility of your shoulder. These exercises also help to relieve pain, numbness, and tingling. These exercises involve stretching your injured shoulder directly. Exercise A: Pendulum  1. Stand near a wall or a surface that you can hold onto for balance. 2. Bend at the waist and let your left / right arm hang straight down. Use your other arm to support you. Keep your back straight and do not lock your knees. 3. Relax your left / right arm and shoulder muscles, and move your hips and your trunk so your left / right arm swings freely. Your arm should swing because of the motion of your body, not because you are using your arm or shoulder muscles. 4. Keep moving your body so your arm swings in the following directions, as told by your health care provider: ? Side to side. ? Forward and backward. ? In clockwise and counterclockwise circles. 5. Continue each motion for __________ seconds, or for as long as told by your health care provider. 6. Slowly return to the starting position. Repeat __________ times. Complete this exercise __________ times a day. Exercise B:Flexion, Standing  1. Stand and hold a broomstick, a cane, or a similar object. Place your hands a little more than shoulder-width apart on the object. Your left / right hand should be palm-up, and your other hand should be palm-down. 2. Keep your elbow straight and keep your shoulder muscles relaxed. Push the stick down with  your healthy arm to raise your left / right arm in front of your body, and then over your head until you feel a stretch in your shoulder. ? Avoid shrugging your shoulder while you raise your arm. Keep your shoulder blade tucked down toward the middle of your back. 3. Hold for __________ seconds. 4. Slowly return to the starting position. Repeat __________ times. Complete this exercise __________ times a day. Exercise C: Abduction, Standing 1. Stand and hold a broomstick, a cane, or a similar object. Place your hands a little more than shoulder-width apart on the object. Your left / right hand should be palm-up, and your other hand should be palm-down. 2. While keeping your elbow straight and your shoulder muscles relaxed, push the stick across your body toward your left / right side. Raise your left / right arm to the side of your body and then over your head until you feel a stretch in your shoulder. ? Do not raise your arm above shoulder height, unless your health care provider tells you to do that. ? Avoid shrugging your shoulder while you raise your arm. Keep your shoulder blade tucked down toward the middle of your back. 3. Hold for __________ seconds. 4. Slowly return to the starting position. Repeat __________ times. Complete this exercise __________ times a day. Exercise D:Internal Rotation  1. Place your left / right hand behind your back, palm-up. 2. Use your other hand to dangle an exercise band, a towel, or a similar object over your shoulder. Grasp the band with   your left / right hand so you are holding onto both ends. 3. Gently pull up on the band until you feel a stretch in the front of your left / right shoulder. ? Avoid shrugging your shoulder while you raise your arm. Keep your shoulder blade tucked down toward the middle of your back. 4. Hold for __________ seconds. 5. Release the stretch by letting go of the band and lowering your hands. Repeat __________ times. Complete  this exercise __________ times a day. STRETCHING EXERCISES These exercises warm up your muscles and joints and improve the movement and flexibility of your shoulder. These exercises also help to relieve pain, numbness, and tingling. These exercises are done using your healthy shoulder to help stretch the muscles of your injured shoulder. Exercise E: Corner Stretch (External Rotation and Abduction)  1. Stand in a doorway with one of your feet slightly in front of the other. This is called a staggered stance. If you cannot reach your forearms to the door frame, stand facing a corner of a room. 2. Choose one of the following positions as told by your health care provider: ? Place your hands and forearms on the door frame above your head. ? Place your hands and forearms on the door frame at the height of your head. ? Place your hands on the door frame at the height of your elbows. 3. Slowly move your weight onto your front foot until you feel a stretch across your chest and in the front of your shoulders. Keep your head and chest upright and keep your abdominal muscles tight. 4. Hold for __________ seconds. 5. To release the stretch, shift your weight to your back foot. Repeat __________ times. Complete this stretch __________ times a day. Exercise F:Extension, Standing 1. Stand and hold a broomstick, a cane, or a similar object behind your back. ? Your hands should be a little wider than shoulder-width apart. ? Your palms should face away from your back. 2. Keeping your elbows straight and keeping your shoulder muscles relaxed, move the stick away from your body until you feel a stretch in your shoulder. ? Avoid shrugging your shoulders while you move the stick. Keep your shoulder blade tucked down toward the middle of your back. 3. Hold for __________ seconds. 4. Slowly return to the starting position. Repeat __________ times. Complete this exercise __________ times a day. STRENGTHENING  EXERCISES These exercises build strength and endurance in your shoulder. Endurance is the ability to use your muscles for a long time, even after they get tired. Exercise G:External Rotation  1. Sit in a stable chair without armrests. 2. Secure an exercise band at elbow height on your left / right side. 3. Place a soft object, such as a folded towel or a small pillow, between your left / right upper arm and your body to move your elbow a few inches away (about 10 cm) from your side. 4. Hold the end of the band so it is tight and there is no slack. 5. Keeping your elbow pressed against the soft object, move your left / right forearm out, away from your abdomen. Keep your body steady so only your forearm moves. 6. Hold for __________ seconds. 7. Slowly return to the starting position. Repeat __________ times. Complete this exercise __________ times a day. Exercise H:Shoulder Abduction  1. Sit in a stable chair without armrests, or stand. 2. Hold a __________ weight in your left / right hand, or hold an exercise band with both hands.   3. Start with your arms straight down and your left / right palm facing in, toward your body. 4. Slowly lift your left / right hand out to your side. Do not lift your hand above shoulder height unless your health care provider tells you that this is safe. ? Keep your arms straight. ? Avoid shrugging your shoulder while you do this movement. Keep your shoulder blade tucked down toward the middle of your back. 5. Hold for __________ seconds. 6. Slowly lower your arm, and return to the starting position. Repeat __________ times. Complete this exercise __________ times a day. Exercise I:Shoulder Extension 1. Sit in a stable chair without armrests, or stand. 2. Secure an exercise band to a stable object in front of you where it is at shoulder height. 3. Hold one end of the exercise band in each hand. Your palms should face each other. 4. Straighten your elbows and  lift your hands up to shoulder height. 5. Step back, away from the secured end of the exercise band, until the band is tight and there is no slack. 6. Squeeze your shoulder blades together as you pull your hands down to the sides of your thighs. Stop when your hands are straight down by your sides. Do not let your hands go behind your body. 7. Hold for __________ seconds. 8. Slowly return to the starting position. Repeat __________ times. Complete this exercise __________ times a day. Exercise J:Standing Shoulder Row 1. Sit in a stable chair without armrests, or stand. 2. Secure an exercise band to a stable object in front of you so it is at waist height. 3. Hold one end of the exercise band in each hand. Your palms should be in a thumbs-up position. 4. Bend each of your elbows to an "L" shape (about 90 degrees) and keep your upper arms at your sides. 5. Step back until the band is tight and there is no slack. 6. Slowly pull your elbows back behind you. 7. Hold for __________ seconds. 8. Slowly return to the starting position. Repeat __________ times. Complete this exercise __________ times a day. Exercise K:Shoulder Press-Ups  1. Sit in a stable chair that has armrests. Sit upright, with your feet flat on the floor. 2. Put your hands on the armrests so your elbows are bent and your fingers are pointing forward. Your hands should be about even with the sides of your body. 3. Push down on the armrests and use your arms to lift yourself off of the chair. Straighten your elbows and lift yourself up as much as you comfortably can. ? Move your shoulder blades down, and avoid letting your shoulders move up toward your ears. ? Keep your feet on the ground. As you get stronger, your feet should support less of your body weight as you lift yourself up. 4. Hold for __________ seconds. 5. Slowly lower yourself back into the chair. Repeat __________ times. Complete this exercise __________ times a  day. Exercise L: Wall Push-Ups  1. Stand so you are facing a stable wall. Your feet should be about one arm-length away from the wall. 2. Lean forward and place your palms on the wall at shoulder height. 3. Keep your feet flat on the floor as you bend your elbows and lean forward toward the wall. 4. Hold for __________ seconds. 5. Straighten your elbows to push yourself back to the starting position. Repeat __________ times. Complete this exercise __________ times a day. This information is not intended to replace advice   given to you by your health care provider. Make sure you discuss any questions you have with your health care provider. Document Released: 04/12/2005 Document Revised: 02/21/2016 Document Reviewed: 02/07/2015 Elsevier Interactive Patient Education  2018 [employer]  

## 2018-02-01 ENCOUNTER — Ambulatory Visit (HOSPITAL_BASED_OUTPATIENT_CLINIC_OR_DEPARTMENT_OTHER): Payer: Medicare Other | Admitting: Physical Medicine & Rehabilitation

## 2018-02-01 ENCOUNTER — Other Ambulatory Visit: Payer: Self-pay

## 2018-02-01 ENCOUNTER — Encounter: Payer: Self-pay | Admitting: Physical Medicine & Rehabilitation

## 2018-02-01 ENCOUNTER — Encounter: Payer: Medicare Other | Attending: Physical Medicine & Rehabilitation

## 2018-02-01 VITALS — BP 141/86 | HR 85 | Ht 72.0 in | Wt 280.0 lb

## 2018-02-01 DIAGNOSIS — Z87891 Personal history of nicotine dependence: Secondary | ICD-10-CM | POA: Diagnosis not present

## 2018-02-01 DIAGNOSIS — I10 Essential (primary) hypertension: Secondary | ICD-10-CM | POA: Insufficient documentation

## 2018-02-01 DIAGNOSIS — E669 Obesity, unspecified: Secondary | ICD-10-CM | POA: Diagnosis present

## 2018-02-01 DIAGNOSIS — Z86718 Personal history of other venous thrombosis and embolism: Secondary | ICD-10-CM | POA: Diagnosis not present

## 2018-02-01 DIAGNOSIS — I639 Cerebral infarction, unspecified: Secondary | ICD-10-CM | POA: Diagnosis not present

## 2018-02-01 DIAGNOSIS — E1169 Type 2 diabetes mellitus with other specified complication: Secondary | ICD-10-CM | POA: Insufficient documentation

## 2018-02-01 DIAGNOSIS — Z8673 Personal history of transient ischemic attack (TIA), and cerebral infarction without residual deficits: Secondary | ICD-10-CM | POA: Diagnosis not present

## 2018-02-01 DIAGNOSIS — M7542 Impingement syndrome of left shoulder: Secondary | ICD-10-CM

## 2018-02-01 DIAGNOSIS — M1990 Unspecified osteoarthritis, unspecified site: Secondary | ICD-10-CM | POA: Diagnosis not present

## 2018-02-01 DIAGNOSIS — G8194 Hemiplegia, unspecified affecting left nondominant side: Secondary | ICD-10-CM | POA: Diagnosis not present

## 2018-02-01 DIAGNOSIS — J449 Chronic obstructive pulmonary disease, unspecified: Secondary | ICD-10-CM | POA: Diagnosis not present

## 2018-02-01 DIAGNOSIS — Z6837 Body mass index (BMI) 37.0-37.9, adult: Secondary | ICD-10-CM | POA: Insufficient documentation

## 2018-02-01 NOTE — Patient Instructions (Signed)
Please increase Voltaren gel to 4 times per day

## 2018-02-01 NOTE — Progress Notes (Signed)
Subjective:    Patient ID: Zachary LeiElton J Deleon, male    DOB: 10/10/1965, 52 y.o.   MRN: [id number] 52 year old right-handed male with history of COPD, tobacco abuse, carotid stenosis [facility], diabetes mellitus and brain tumor with resection 20 years ago.  He lives with his wife.   He used a cane prior to admission.  He presented to [facility] on 12/02/2017 with left-sided weakness and slurred speech.  Urine drug screen negative.  Cranial CT scan showed postsurgical changes in the right cerebellar hemisphere, no acute  process.  The patient did not receive tPA.  MRI/MRA showed patchy areas of subacute infarction in the right posterior MCA territory.  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  Carotid Doppler showed right ICA  occlusion a greater than 70% left ICA.  Echocardiogram with ejection fraction of 60%, no wall motion abnormalities.  EEG negative for seizure.  Neurology service is consulted.  Maintained on aspirin and Plavix   HPI Chief complaint left shoulder pain No falls or trauma Patient feels like the last injection was helpful for a couple weeks but then pain returned.  He continues to receive home health PT and OT. Pain increases in the left shoulder with overhead activity. Continues have left-sided hemiparesis as well as some knee buckling on the left side continues to wear AFO.  He requires a gait belt for ambulation.  He uses a walker. Some assistance with dressing and bathing.   Pain Inventory Average Pain 3 Pain Right Now 1 My pain is tingling and aching  In the last 24 hours, has pain interfered with the following? General activity 1 Relation with others 0 Enjoyment of life 0 What TIME of day is your pain at its worst? night Sleep (in general) Fair  Pain is worse with: some activites Pain improves with: heat/ice, therapy/exercise and medication Relief from Meds:  4  Mobility walk without assistance use a cane use a walker how many minutes can you walk? 4-5 ability to climb steps?  yes do you drive?  no  Function disabled: date disabled 9 yrs I need assistance with the following:  dressing, bathing, meal prep, household duties and shopping  Neuro/Psych weakness numbness tingling trouble walking spasms depression anxiety  Prior Studies Any changes since last visit?  no  Physicians involved in your care Any changes since last visit?  no   No family history on file. Social History   Socioeconomic History  . Marital status: Married    Spouse name: Not on file  . Number of children: Not on file  . Years of education: Not on file  . Highest education level: Not on file  Occupational History  . Not on file  Social Needs  . Financial resource strain: Not on file  . Food insecurity:    Worry: Not on file    Inability: Not on file  . Transportation needs:    Medical: Not on file    Non-medical: Not on file  Tobacco Use  . Smoking status: Former Games developermoker  . Smokeless tobacco: Never Used  Substance and Sexual Activity  . Alcohol use: Not Currently  . Drug use: Not Currently  . Sexual activity: Not on file  Lifestyle  . Physical activity:    Days per week: Not on file    Minutes per session: Not on file  . Stress: Not on file  Relationships  . Social connections:  Talks on phone: Not on file    Gets together: Not on file    Attends religious service: Not on file    Active member of club or organization: Not on file    Attends meetings of clubs or organizations: Not on file    Relationship status: Not on file  Other Topics Concern  . Not on file  Social History Narrative  . Not on file   Past Surgical History:  Procedure Laterality Date  . BRAIN SURGERY    . BRAIN TUMOR EXCISION     performed at [facility]  . CATARACT EXTRACTION     Past Medical History:  Diagnosis Date  . Arthritis   . COPD (chronic  obstructive pulmonary disease) (HCC)   . Diabetes mellitus without complication (HCC)   . DVT (deep venous thrombosis) (HCC)   . Hypertension   . Seizures (HCC)   . Stroke (HCC) 11/2017   BP (!) 141/86   Pulse 85   Ht 6' (1.829 m)   Wt 280 lb (127 kg) Comment: reported  SpO2 96%   BMI 37.97 kg/m   Opioid Risk Score:   Fall Risk Score:  `1  Depression screen PHQ 2/9  Depression screen PHQ 2/9 02/01/2018  Decreased Interest 1  Down, Depressed, Hopeless 1  PHQ - 2 Score 2   Review of Systems  Constitutional: Positive for diaphoresis.  HENT: Negative.   Eyes: Negative.   Respiratory: Negative.   Cardiovascular: Positive for leg swelling.       Left foot  Gastrointestinal: Positive for diarrhea.  Endocrine: Negative.   Genitourinary: Negative.   Musculoskeletal: Positive for gait problem.       Shoulder pain/ spasms  Skin: Negative.   Allergic/Immunologic: Negative.   Neurological: Positive for numbness.       Tingling  Hematological: Negative.   Psychiatric/Behavioral: Positive for dysphoric mood. The patient is nervous/anxious.   All other systems reviewed and are negative.      Objective:   Physical Exam Positive impingement signs left shoulder at 90 degrees mild pain with external rotation but has full range at the shoulder. His motor strength is 2- left shoulder 3 at the biceps triceps and grip 3- left hip flexion 3 left knee extensors, left ankle dorsiflexor not tested secondary to AFO Low back has some tenderness around the gluteal musculature.  No pain over the low back paraspinals. No tenderness over the greater trochanter Left knee no evidence of effusion there is no erythema. Reduced sensation left arm as well as left leg      Assessment & Plan:  1.  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  Overall making some functional gains we discussed that in terms of his sensory deficits approximately 6 months before we  know what will be permanent and what will not be permanent. 2.  Left shoulder pain subacromial impingement syndrome plus minus adhesive capsulitis.  Can also try some Voltaren gel 4 times daily to the left shoulder  Shoulder injection LEFT [place]   Indication:Left Shoulder pain not relieved by medication management and other conservative care.  Informed consent was obtained after describing risks and benefits of the procedure with the patient, this includes bleeding, bruising, infection and medication side effects. The patient wishes to proceed and has given written consent. Patient was placed in a seated position.  3 at the biceps triceps was marked and prepped with betadine in the subacromial area. A 25-gauge 1-1/2 inch needle was  inserted into the subacromial area. After negative draw back for blood, a solution containing 1 mL of 6 mg per ML betamethasone and 4 mL of 1% lidocaine was injected. A band aid was applied. The patient tolerated the procedure well. Post procedure instructions were given.  3.  Left knee pain was scheduled for meniscal repair but this was interrupted by stroke.  No effusion no locking.  Recommend Voltaren gel 4 times daily rather than twice daily as they are doing.  Patient requesting refill of tramadol.  We discussed that this would require urine drug screen prior to starting as well as controlled substance agreement.  He states that he was not taking this medication prior to his stroke.  He states that his main complaint is shoulder pain.  This should improve with treatment with PT OT as well as injections.  The lab is no longer open for UDS today if he wishes to have this medication prescribed he will need to come back when the lab is open.  In the meantime he will try Tylenol 500 mg 2 tablets 3 times daily as well as Terry gel to the shoulder he also has as needed Robaxin and takes gabapentin 100 mg 3 times daily which could potentially be increased

## 2018-03-15 ENCOUNTER — Encounter: Payer: Self-pay | Admitting: Physical Medicine & Rehabilitation

## 2018-03-15 ENCOUNTER — Ambulatory Visit: Payer: Medicare PPO | Admitting: Physical Medicine & Rehabilitation

## 2018-03-15 ENCOUNTER — Encounter: Payer: Medicare PPO | Attending: Physical Medicine & Rehabilitation

## 2018-03-15 VITALS — BP 123/85 | HR 75 | Resp 14 | Ht 72.0 in | Wt 280.0 lb

## 2018-03-15 DIAGNOSIS — Z8673 Personal history of transient ischemic attack (TIA), and cerebral infarction without residual deficits: Secondary | ICD-10-CM | POA: Diagnosis not present

## 2018-03-15 DIAGNOSIS — E1169 Type 2 diabetes mellitus with other specified complication: Secondary | ICD-10-CM | POA: Diagnosis not present

## 2018-03-15 DIAGNOSIS — G8194 Hemiplegia, unspecified affecting left nondominant side: Secondary | ICD-10-CM | POA: Diagnosis not present

## 2018-03-15 DIAGNOSIS — Z87891 Personal history of nicotine dependence: Secondary | ICD-10-CM | POA: Diagnosis not present

## 2018-03-15 DIAGNOSIS — Z86718 Personal history of other venous thrombosis and embolism: Secondary | ICD-10-CM | POA: Diagnosis not present

## 2018-03-15 DIAGNOSIS — I63511 Cerebral infarction due to unspecified occlusion or stenosis of right middle cerebral artery: Secondary | ICD-10-CM | POA: Diagnosis not present

## 2018-03-15 DIAGNOSIS — I10 Essential (primary) hypertension: Secondary | ICD-10-CM | POA: Diagnosis not present

## 2018-03-15 DIAGNOSIS — M1990 Unspecified osteoarthritis, unspecified site: Secondary | ICD-10-CM | POA: Diagnosis not present

## 2018-03-15 DIAGNOSIS — Z6837 Body mass index (BMI) 37.0-37.9, adult: Secondary | ICD-10-CM | POA: Insufficient documentation

## 2018-03-15 DIAGNOSIS — I69398 Other sequelae of cerebral infarction: Secondary | ICD-10-CM

## 2018-03-15 DIAGNOSIS — J449 Chronic obstructive pulmonary disease, unspecified: Secondary | ICD-10-CM | POA: Insufficient documentation

## 2018-03-15 DIAGNOSIS — R209 Unspecified disturbances of skin sensation: Secondary | ICD-10-CM

## 2018-03-15 DIAGNOSIS — E669 Obesity, unspecified: Secondary | ICD-10-CM | POA: Diagnosis present

## 2018-03-15 NOTE — Patient Instructions (Signed)
The abnormal sensation is due to stroke , if it becomes more painful may need to increase gabapentin

## 2018-03-15 NOTE — Progress Notes (Signed)
Subjective:    Patient ID: Zachary Deleon, male    DOB: May 21, 1966, 52 y.o.   MRN: [id number] 52 year old right-handed male with history of COPD, tobacco abuse, carotid stenosis followed by vascular surgery at [facility], diabetes mellitus and brain tumor with resection 20 years ago.  He lives with his wife.   He used a cane prior to admission.  He presented to [facility] on 12/02/2017 with left-sided weakness and slurred speech.  Urine drug screen negative.  Cranial CT scan showed postsurgical changes in the right cerebellar hemisphere, no acute  process.  The patient did not receive tPA.  MRI/MRA showed patchy areas of subacute infarction in the right posterior MCA territory.  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  Carotid Doppler showed right ICA  occlusion a greater than 70% left ICA.  Echocardiogram with ejection fraction of 60%, no wall motion abnormalities.  EEG negative for seizure.  Neurology service is consulted.  Maintained on aspirin and Plavix    HPI Diabetic control improving  Left shouder injection 6 wks ago was helpful  Tingling in Left hand, altered temperature patient also notices drawing of the left arm during ambulation has some curling of the fingers.  He does not have this at rest. He does not have significant pain with his tingling. He has had no increased weakness on the left side of the body. His gait has improved and he is no longer using a walker just using a cane.  His spouse does walk with him and keeps a gait belt on him just in case.  No falls  Pain Inventory Average Pain 8 Pain Right Now 3 My pain is stabbing, tingling and aching  In the last 24 hours, has pain interfered with the following? General activity 6 Relation with others 6 Enjoyment of life 6 What TIME of day is your pain at its worst? all Sleep (in general) Poor  Pain is worse with: some activites Pain improves with:  medication and injections Relief from Meds: 2  Mobility walk with assistance use a cane use a walker how many minutes can you walk? 5 ability to climb steps?  yes do you drive?  no use a wheelchair  Function not employed: date last employed . disabled: date disabled . I need assistance with the following:  dressing, bathing, meal prep, household duties and shopping  Neuro/Psych weakness numbness tingling spasms depression loss of taste or smell  Prior Studies Any changes since last visit?  no  Physicians involved in your care Any changes since last visit?  no   No family history on file. Social History   Socioeconomic History  . Marital status: Married    Spouse name: Not on file  . Number of children: Not on file  . Years of education: Not on file  . Highest education level: Not on file  Occupational History  . Not on file  Social Needs  . Financial resource strain: Not on file  . Food insecurity:    Worry: Not on file    Inability: Not on file  . Transportation needs:    Medical: Not on file    Non-medical: Not on file  Tobacco Use  . Smoking status: Former Games developer  . Smokeless tobacco: Never Used  Substance and Sexual Activity  . Alcohol use: Not Currently  . Drug use: Not Currently  . Sexual activity: Not on file  Lifestyle  . Physical activity:  Days per week: Not on file    Minutes per session: Not on file  . Stress: Not on file  Relationships  . Social connections:    Talks on phone: Not on file    Gets together: Not on file    Attends religious service: Not on file    Active member of club or organization: Not on file    Attends meetings of clubs or organizations: Not on file    Relationship status: Not on file  Other Topics Concern  . Not on file  Social History Narrative  . Not on file   Past Surgical History:  Procedure Laterality Date  . BRAIN SURGERY    . BRAIN TUMOR EXCISION     performed at [facility]  . CATARACT EXTRACTION      Past Medical History:  Diagnosis Date  . Arthritis   . COPD (chronic obstructive pulmonary disease) (HCC)   . Diabetes mellitus without complication (HCC)   . DVT (deep venous thrombosis) (HCC)   . Hypertension   . Seizures (HCC)   . Stroke (HCC) 11/2017   Ht 6' (1.829 m)   Wt 280 lb (127 kg)   BMI 37.97 kg/m   Opioid Risk Score:   Fall Risk Score:  `1  Depression screen PHQ 2/9  Depression screen PHQ 2/9 02/01/2018  Decreased Interest 1  Down, Depressed, Hopeless 1  PHQ - 2 Score 2    Review of Systems  Constitutional: Positive for appetite change and diaphoresis.  HENT: Negative.   Eyes: Negative.   Respiratory: Negative.   Cardiovascular: Positive for leg swelling.  Gastrointestinal: Positive for diarrhea.  Endocrine:       High blood sugar  Genitourinary: Negative.   Musculoskeletal: Positive for arthralgias and back pain.       Spasms   Skin: Negative.   Allergic/Immunologic: Negative.   Neurological: Negative.        Tingling  Hematological: Negative.   Psychiatric/Behavioral: Positive for dysphoric mood.       Objective:   Physical Exam  Constitutional: He is oriented to person, place, and time. He appears well-developed and well-nourished. No distress.  HENT:  Head: Normocephalic and atraumatic.  Eyes: Pupils are equal, round, and reactive to light. EOM are normal.  Neck: Normal range of motion.  Musculoskeletal:  Mild pain with right shoulder abduction at end range. No pain with neck range of motion. No pain with movement of the elbow wrist or fingers. There is mild swelling of the dorsum of the left hand as well as the pretibial area left leg and ankle.  Neurological: He is alert and oriented to person, place, and time.  3/5 strength in the left deltoid bicep tricep finger flexors and extensors 3 at the left hip flexor 4 at the left knee extensor and 2- ankle dorsiflexor plantar flexor  Sensation is reduced to pinprick in the left lower  limb and left face and absent in the left upper limb light touch is also impaired in the left upper limb. Ambulates with a cane and contact-guard assistance.  He has good foot clearance with AFO  Skin: Skin is warm and dry. He is not diaphoretic.  Psychiatric: He has a normal mood and affect.  Nursing note and vitals reviewed.  During gait he does have progressive flexion of his left elbow but can be cued to straighten.  Also has some progressive finger flexion Can be cued to straighten      Assessment & Plan:  1.  Right posterior MCA distribution infarct with left hemiparesis left hemisensory deficits overall making good functional improvement. We discussed the stroke deficits including sensory deficits.  We discussed his temperature sensation being altered. He is planning to get carotid endarterectomy on the left with stenting at [facility] later this month.  2.  Left shoulder impingement syndrome overall improved may have some element of adhesive capsulitis as well, I will see him back in approximately 6 weeks may need to reinject shoulder at that time  He would benefit from outpatient PT OT however his home health has run out and cannot afford co-pays for outpatient therapy

## 2018-03-29 ENCOUNTER — Emergency Department (HOSPITAL_COMMUNITY)
Admission: EM | Admit: 2018-03-29 | Discharge: 2018-03-29 | Disposition: A | Discharge status: Home / Self Care | Payer: Medicare PPO | Attending: Emergency Medicine | Admitting: Emergency Medicine

## 2018-03-29 ENCOUNTER — Encounter (HOSPITAL_COMMUNITY): Payer: Self-pay

## 2018-03-29 ENCOUNTER — Emergency Department (HOSPITAL_COMMUNITY): Payer: Medicare PPO

## 2018-03-29 ENCOUNTER — Other Ambulatory Visit: Payer: Self-pay

## 2018-03-29 DIAGNOSIS — R5383 Other fatigue: Secondary | ICD-10-CM | POA: Diagnosis present

## 2018-03-29 DIAGNOSIS — I10 Essential (primary) hypertension: Secondary | ICD-10-CM | POA: Diagnosis not present

## 2018-03-29 DIAGNOSIS — E119 Type 2 diabetes mellitus without complications: Secondary | ICD-10-CM | POA: Diagnosis not present

## 2018-03-29 DIAGNOSIS — Z87891 Personal history of nicotine dependence: Secondary | ICD-10-CM | POA: Insufficient documentation

## 2018-03-29 DIAGNOSIS — R0602 Shortness of breath: Secondary | ICD-10-CM | POA: Diagnosis not present

## 2018-03-29 DIAGNOSIS — Z7902 Long term (current) use of antithrombotics/antiplatelets: Secondary | ICD-10-CM | POA: Diagnosis not present

## 2018-03-29 DIAGNOSIS — R4182 Altered mental status, unspecified: Secondary | ICD-10-CM | POA: Diagnosis not present

## 2018-03-29 DIAGNOSIS — J449 Chronic obstructive pulmonary disease, unspecified: Secondary | ICD-10-CM | POA: Insufficient documentation

## 2018-03-29 DIAGNOSIS — Z79899 Other long term (current) drug therapy: Secondary | ICD-10-CM | POA: Diagnosis not present

## 2018-03-29 HISTORY — DX: Depression, unspecified: F32.A

## 2018-03-29 HISTORY — DX: Major depressive disorder, single episode, unspecified: F32.9

## 2018-03-29 HISTORY — DX: Occlusion and stenosis of unspecified carotid artery: I65.29

## 2018-03-29 HISTORY — DX: Other disorders of lung: J98.4

## 2018-03-29 HISTORY — DX: Obstructive sleep apnea (adult) (pediatric): G47.33

## 2018-03-29 HISTORY — DX: Other specified postprocedural states: Z98.890

## 2018-03-29 LAB — COMPREHENSIVE METABOLIC PANEL
ALK PHOS: 72 U/L (ref 38–126)
ALT: 15 U/L (ref 0–44)
AST: 16 U/L (ref 15–41)
Albumin: 3.7 g/dL (ref 3.5–5.0)
Anion gap: 10 (ref 5–15)
BILIRUBIN TOTAL: 0.7 mg/dL (ref 0.3–1.2)
BUN: 13 mg/dL (ref 6–20)
CALCIUM: 8.7 mg/dL — AB (ref 8.9–10.3)
CO2: 22 mmol/L (ref 22–32)
CREATININE: 0.81 mg/dL (ref 0.61–1.24)
Chloride: 97 mmol/L — ABNORMAL LOW (ref 98–111)
GFR calc non Af Amer: >60 mL/min (ref >60)
Glucose, Bld: 248 mg/dL — ABNORMAL HIGH (ref 70–99)
Potassium: 3.7 mmol/L (ref 3.5–5.1)
Sodium: 129 mmol/L — ABNORMAL LOW (ref 135–145)
Total Protein: 7.8 g/dL (ref 6.5–8.1)

## 2018-03-29 LAB — CBC WITH DIFFERENTIAL/PLATELET
Abs Immature Granulocytes: 0.1 10*3/uL — ABNORMAL HIGH (ref 0.00–0.07)
BASOS ABS: 0.1 10*3/uL (ref 0.0–0.1)
Basophils Relative: 0 %
EOS ABS: 0.1 10*3/uL (ref 0.0–0.5)
EOS PCT: 0 %
HEMATOCRIT: 40.3 % (ref 39.0–52.0)
HEMOGLOBIN: 13.2 g/dL (ref 13.0–17.0)
Immature Granulocytes: 1 %
LYMPHS PCT: 14 %
Lymphs Abs: 1.6 10*3/uL (ref 0.7–4.0)
MCH: 28.6 pg (ref 26.0–34.0)
MCHC: 32.8 g/dL (ref 30.0–36.0)
MCV: 87.2 fL (ref 80.0–100.0)
MONO ABS: 1 10*3/uL (ref 0.1–1.0)
Monocytes Relative: 9 %
Neutro Abs: 8.5 10*3/uL — ABNORMAL HIGH (ref 1.7–7.7)
Neutrophils Relative %: 76 %
Platelets: 178 10*3/uL (ref 150–400)
RBC: 4.62 MIL/uL (ref 4.22–5.81)
RDW: 12.8 % (ref 11.5–15.5)
WBC: 11.2 10*3/uL — ABNORMAL HIGH (ref 4.0–10.5)
nRBC: 0 % (ref 0.0–0.2)

## 2018-03-29 LAB — TROPONIN I

## 2018-03-29 LAB — BRAIN NATRIURETIC PEPTIDE: B NATRIURETIC PEPTIDE 5: 63 pg/mL (ref 0.0–100.0)

## 2018-03-29 MED ORDER — IOHEXOL 300 MG/ML  SOLN
75.0000 mL | Freq: Once | INTRAMUSCULAR | Status: AC | PRN
  Administered 2018-03-29: 75 mL via INTRAVENOUS

## 2018-03-29 MED ORDER — SODIUM CHLORIDE 0.9 % IV SOLN
INTRAVENOUS | Status: DC
  Administered 2018-03-29: 18:00:00 via INTRAVENOUS

## 2018-03-29 MED ORDER — NALOXONE HCL 0.4 MG/ML IJ SOLN
0.4000 mg | Freq: Once | INTRAMUSCULAR | Status: AC
  Administered 2018-03-29: 0.4 mg via INTRAVENOUS
  Filled 2018-03-29: qty 1

## 2018-03-29 NOTE — ED Provider Notes (Signed)
[facility] EMERGENCY DEPARTMENT Provider Note   CSN: [id number] Arrival date & time: 03/29/18  1632     History   Chief Complaint Chief Complaint  Patient presents with  . lethargic    HPI Zachary Deleon is a 52 y.o. male.  Patient just discharged from [facility] yesterday following an left carotid endarterectomy that was done the day before.  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  But many times they do not do the surgery for that.  So that may have been a high percentage.  Patient has been taking his hydrocodone as ordered.  Patient's wife noted that patient was lethargic and not very alert.  He felt as if he was having trouble getting air in but did not feel short of breath.  Oxygen saturations upon arrival here were 97% on room air.  Patient denied any chest pain.  Just felt groggy.  Patient has a history of prior strokes.     Past Medical History:  Diagnosis Date  . Arthritis   . Carotid stenosis   . COPD (chronic obstructive pulmonary disease) (HCC)   . Depression   . Diabetes mellitus without complication (HCC)   . DVT (deep venous thrombosis) (HCC)   . Hypertension   . OSA (obstructive sleep apnea)   . Restrictive lung disease   . Seizures (HCC)   . Status post carotid endarterectomy    with patch angioplasty  . Stroke [facility]) 11/2017    Patient Active Problem List   Diagnosis Date Noted  . Thrombocytopenia (HCC)   . Hypomagnesemia   . Hyponatremia   . Diabetes mellitus type 2 in obese (HCC)   . 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 Left hemiparesis (HCC)   . Morbid obesity (HCC)   . Essential hypertension   . Stroke (HCC) 12/02/2017  . Type 2 diabetes mellitus without complication (HCC) 12/02/2017    Past Surgical History:  Procedure Laterality Date  . BRAIN SURGERY    . BRAIN TUMOR EXCISION     performed at [facility]  . CATARACT EXTRACTION          Home Medications    Prior to  Admission medications   Medication Sig Start Date End Date Taking? Authorizing Provider  acetaminophen (TYLENOL) 325 MG tablet Take 2 tablets (650 mg total) by mouth every 4 (four) hours as needed for mild pain (or temp > 37.5 C (99.5 F)). 12/20/17   Angiulli, Mcarthur Rossetti, PA-C  albuterol (VENTOLIN HFA) 108 (90 Base) MCG/ACT inhaler Inhale 2 puffs into the lungs every 4 (four) hours as needed for wheezing or shortness of breath. 12/20/17   Angiulli, Mcarthur Rossetti, PA-C  amLODipine (NORVASC) 10 MG tablet Take 1 tablet (10 mg total) by mouth daily. 12/21/17   Angiulli, Mcarthur Rossetti, PA-C  aspirin 325 MG tablet Take 1 tablet (325 mg total) by mouth daily. 12/07/17   Johnson, Clanford L, MD  atorvastatin (LIPITOR) 80 MG tablet Take 1 tablet (80 mg total) by mouth daily. 12/20/17   Angiulli, Mcarthur Rossetti, PA-C  clopidogrel (PLAVIX) 75 MG tablet Take 1 tablet (75 mg total) by mouth daily with breakfast. 12/20/17   Angiulli, Mcarthur Rossetti, PA-C  diclofenac sodium (VOLTAREN) 1 % GEL Apply 2 g topically 4 (four) times daily. 12/20/17   Angiulli, Mcarthur Rossetti, PA-C  fenofibrate 160 MG tablet Take 1 tablet (160 mg total) by mouth daily. 12/21/17   Angiulli, Mcarthur Rossetti, PA-C  FLUoxetine (  PROZAC) 20 MG capsule Take 1 capsule (20 mg total) by mouth daily. 12/20/17   Angiulli, Mcarthur Rossetti, PA-C  Fluticasone-Salmeterol (ADVAIR) 250-50 MCG/DOSE AEPB Inhale 1 puff into the lungs 2 (two) times daily. 12/20/17   Angiulli, Mcarthur Rossetti, PA-C  gabapentin (NEURONTIN) 100 MG capsule Take 1 capsule (100 mg total) by mouth 3 (three) times daily. 12/20/17   Angiulli, Mcarthur Rossetti, PA-C  lidocaine (LIDODERM) 5 % Place 1 patch onto the skin daily. Remove & Discard patch within 12 hours or as directed by MD 12/21/17   Angiulli, Mcarthur Rossetti, PA-C  methocarbamol (ROBAXIN) 500 MG tablet Take 1 tablet (500 mg total) by mouth every 8 (eight) hours as needed for muscle spasms. 12/20/17   Angiulli, Mcarthur Rossetti, PA-C  nicotine (NICODERM CQ - DOSED IN MG/24 HOURS) 14 mg/24hr patch 14 mg patch  daily x3 weeks then 7 mg patch daily x3 weeks and stop 12/20/17   Angiulli, Mcarthur Rossetti, PA-C  QUEtiapine (SEROQUEL) 25 MG tablet Take 0.5 tablets (12.5 mg total) by mouth 2 (two) times daily as needed (agitation). 12/20/17   Angiulli, Mcarthur Rossetti, PA-C  senna-docusate (SENOKOT-S) 8.6-50 MG tablet Take 1 tablet by mouth at bedtime. 12/06/17   Johnson, Clanford L, MD  tiotropium (SPIRIVA) 18 MCG inhalation capsule Place 18 mcg into inhaler and inhale daily.    [provider]  topiramate (TOPAMAX) 25 MG tablet Take 1 tablet (25 mg total) by mouth 2 (two) times daily. 12/20/17   Angiulli, Mcarthur Rossetti, PA-C  traMADol (ULTRAM) 50 MG tablet Take 0.5-1 tablets (25-50 mg total) by mouth every 6 (six) hours as needed for moderate pain or severe pain. Patient not taking: Reported on 03/15/2018 12/20/17   Angiulli, Mcarthur Rossetti, PA-C    Family History No family history on file.  Social History Social History   Tobacco Use  . Smoking status: Former Games developer  . Smokeless tobacco: Never Used  Substance Use Topics  . Alcohol use: Not Currently  . Drug use: Not Currently     Allergies   Morphine and related   Review of Systems Review of Systems  Constitutional: Positive for fatigue and fever.  HENT: Positive for trouble swallowing.   Eyes: Negative for visual disturbance.  Respiratory: Negative for shortness of breath.   Cardiovascular: Negative for chest pain.  Gastrointestinal: Negative for abdominal pain.  Genitourinary: Negative for dysuria.  Musculoskeletal: Positive for neck pain. Negative for neck stiffness.  Skin: Positive for wound.  Neurological: Negative for headaches.  Hematological: Does not bruise/bleed easily.  Psychiatric/Behavioral: Negative for confusion.     Physical Exam Updated Vital Signs BP 127/82   Pulse 92   Temp 97.9 F (36.6 C) (Oral)   Resp 17   SpO2 96%   Physical Exam  Constitutional: He appears well-developed and well-nourished. No distress.  HENT:  Head:  Normocephalic and atraumatic.  Mouth/Throat: Oropharynx is clear and moist.  Some erythema at the base of his neck midline area.  Surgical scar to the left carotid area healing well with Steri-Strips in place.  No significant erythema there.  No pulsatile mass.  Eyes: Pupils are equal, round, and reactive to light. Conjunctivae and EOM are normal.  Neck: Neck supple.  Cardiovascular: Normal rate, regular rhythm and normal heart sounds.  Pulmonary/Chest: Effort normal and breath sounds normal. No stridor. No respiratory distress. He has no wheezes. He has no rales.  Abdominal: Soft. Bowel sounds are normal. There is no tenderness.  Musculoskeletal: Normal range of motion.  Neurological:  Drowsy but will wake up will follow commands.  Able to move all 4 extremities.  Skin: Skin is warm.  Nursing note and vitals reviewed.    ED Treatments / Results  Labs (all labs ordered are listed, but only abnormal results are displayed) Labs Reviewed  COMPREHENSIVE METABOLIC PANEL - Abnormal; Notable for the following components:      Result Value   Sodium 129 (*)    Chloride 97 (*)    Glucose, Bld 248 (*)    Calcium 8.7 (*)    All other components within normal limits  CBC WITH DIFFERENTIAL/PLATELET - Abnormal; Notable for the following components:   WBC 11.2 (*)    Neutro Abs 8.5 (*)    Abs Immature Granulocytes 0.10 (*)    All other components within normal limits  BRAIN NATRIURETIC PEPTIDE  TROPONIN I    EKG EKG Interpretation  Date/Time:  Friday March 29 2018 16:43:25 EDT Ventricular Rate:  96 PR Interval:    QRS Duration: 79 QT Interval:  382 QTC Calculation: 483 R Axis:   114 Text Interpretation:  Right and left arm electrode reversal, interpretation assumes no reversal Sinus rhythm Probable lateral infarct, age indeterminate Confirmed by Vanetta Mulders [phone]) on 03/29/2018 5:19:29 PM   Radiology Dg Chest 2 View  Result Date: 03/29/2018 CLINICAL DATA:  Shortness of  breath. EXAM: CHEST - 2 VIEW COMPARISON:  Radiographs of December 02, 2017. FINDINGS: The heart size and mediastinal contours are within normal limits. No pneumothorax or pleural effusion is noted. Left lung is clear. Minimal right basilar subsegmental atelectasis is noted. The visualized skeletal structures are unremarkable. IMPRESSION: Minimal right basilar subsegmental atelectasis. Electronically Signed   By: Lupita Raider, M.D.   On: 03/29/2018 19:07   Ct Head Wo Contrast  Result Date: 03/29/2018 CLINICAL DATA:  Altered level of consciousness, unexplained. Carotid endarterectomy on the left yesterday. EXAM: CT HEAD WITHOUT CONTRAST TECHNIQUE: Contiguous axial images were obtained from the base of the skull through the vertex without intravenous contrast. COMPARISON:  12/04/2017 FINDINGS: Brain: Patchy remote infarct in the right MCA territory along the border zone and lateral frontal lobe which have matured since prior. No acute infarct seen on the postoperative left side. Cavum velum interpositum cyst. Prior posterior fossa surgery with superficial cerebellar gliosis and mild CSF accumulation. Surgical indication is uncertain. Vascular: No hyperdense vessel. Skull: Unremarkable suboccipital craniotomy site Sinuses/Orbits: Bilateral cataract resection. IMPRESSION: 1. No acute finding. No evidence of infarct or swelling related to recent left carotid endarterectomy. 2. Chronic right MCA distribution infarcts. Electronically Signed   By: Marnee Spring M.D.   On: 03/29/2018 19:02   Ct Soft Tissue Neck W Contrast  Result Date: 03/29/2018 CLINICAL DATA:  Weakness following carotid endarterectomy. EXAM: CT NECK WITH CONTRAST TECHNIQUE: Multidetector CT imaging of the neck was performed using the standard protocol following the bolus administration of intravenous contrast. CONTRAST:  75mL OMNIPAQUE IOHEXOL 300 MG/ML  SOLN COMPARISON:  Head CT 03/29/2017 and 12/04/2017 FINDINGS: PHARYNX AND LARYNX:  --Nasopharynx: Fossae of Rosenmuller are clear. Normal adenoid tonsils for age. --Oral cavity and oropharynx: The palatine and lingual tonsils are normal. The visible oral cavity and floor of mouth are normal. --Hypopharynx: The left aspect of the vallecula is effaced due to the soft tissue swelling of the left neck. --Larynx: Asymmetry at the level of the false vocal cords and aryepiglottic folds may be secondary to the inflammatory changes in the left neck. --Retropharyngeal space: There is an intermediate sized retropharyngeal  effusion that extends from C2-C4. This measures approximately 11 mm in thickness. SALIVARY GLANDS: --Parotid: No mass lesion or inflammation. No sialolithiasis or ductal dilatation. --Submandibular: Symmetric without inflammation. No sialolithiasis or ductal dilatation. --Sublingual: Normal. No ranula or other visible lesion of the base of tongue and floor of mouth. THYROID: Normal. LYMPH NODES: No enlarged or abnormal density lymph nodes. VASCULAR: Major cervical vessels are patent. Status post left carotid endarterectomy. There is extensive inflammatory change with multiloculated gas within the soft tissues overlying the left carotid sheath, deep to and superficial to the left sternocleidomastoid muscle. Fluid tracks along the anterior surface of the muscle and the anterior aspect of the carotid canal. There is no encapsulated fluid collection. No extension into the mediastinum. LIMITED INTRACRANIAL: Normal. VISUALIZED ORBITS: Normal. MASTOIDS AND VISUALIZED PARANASAL SINUSES: No fluid levels or advanced mucosal thickening. No mastoid effusion. SKELETON: Remote suboccipital craniotomy. UPPER CHEST: Clear. OTHER: None. IMPRESSION: 1. Large amount of soft tissue gas and inflammation of the left neck, status post carotid endarterectomy. These findings may be expected in the immediate post-operative period. However, infection of the operative site could also have this appearance if the  surgery was more than a few days ago. There is fluid tracking anterior to the carotid canal, but no encapsulated collection. 2. Retropharyngeal effusion at C2-4, measuring 11 mm in AP dimension. No retropharyngeal abscess or mediastinal extension. 3. Left carotid system appears patent on this non-angiographic study. Electronically Signed   By: Deatra Robinson M.D.   On: 03/29/2018 19:25    Procedures Procedures (including critical care time)  Medications Ordered in ED Medications  0.9 %  sodium chloride infusion ( Intravenous Stopped 03/29/18 2049)  naloxone [facility]) injection 0.4 mg (0.4 mg Intravenous Given 03/29/18 1729)  iohexol (OMNIPAQUE) 300 MG/ML solution 75 mL (75 mLs Intravenous Contrast Given 03/29/18 1837)     Initial Impression / Assessment and Plan / ED Course  I have reviewed the triage vital signs and the nursing notes.  Pertinent labs & imaging results that were available during my care of the patient were reviewed by me and considered in my medical decision making (see chart for details).     Patient's work-up here included chest x-ray CT head CT neck with contrast.  Labs significant for negative troponin negative BNP.  Oxygen saturations were fine here.  Patient without any obvious shortness of breath.  He was somewhat drowsy and a little bit lethargic.  Patient was given Narcan which woke him up significantly.  Patient even stated the Narcan made him feel normal again.  So possible he may have had a little bit too much pain medicine on board.  Patient's wife states that she is been monitoring the pain medication but he is on long-standing Robaxin may be a combination of the 2 was a little too much.  Do not think that patient is concerning for pulmonary embolus.  EKG showed heart rate in the 90s no significant findings.  CT of head just showed the remote infarcts but nothing acute or new and CT with contrast of the neck showed no significant changes everything found was  expected postoperatively.  Think the main thing is is a Narcan brought the patient back to normal.  He does have some residual weakness from his prior strokes at all baseline.  Patient stable for discharge home.  They will cut his pain medication in half.  They can return for any new or worse symptoms.  Final Clinical Impressions(s) / ED Diagnoses  Final diagnoses:  Altered mental status, unspecified altered mental status type    ED Discharge Orders    None       Vanetta Mulders, MD 03/29/18 2122

## 2018-03-29 NOTE — Discharge Instructions (Addendum)
Today's work-up without any significant abnormalities.  Return for any new or worse symptoms.  It may be possible the pain medications a little strong for you.  I would recommend cutting it in half.  CT of head CT of neck and chest x-ray without any specific findings.

## 2018-03-29 NOTE — ED Triage Notes (Addendum)
Pt released from [facility] yesterday after endarterectomy  100% occlusion to left carotid. Pt has been taking hydrocodone as ordered. Pt feels lethargic and weak

## 2018-04-22 ENCOUNTER — Encounter: Payer: Medicare PPO | Attending: Physical Medicine & Rehabilitation

## 2018-04-22 ENCOUNTER — Ambulatory Visit: Payer: Medicare PPO | Admitting: Physical Medicine & Rehabilitation

## 2018-04-22 DIAGNOSIS — E669 Obesity, unspecified: Secondary | ICD-10-CM | POA: Insufficient documentation

## 2018-04-22 DIAGNOSIS — M1990 Unspecified osteoarthritis, unspecified site: Secondary | ICD-10-CM | POA: Insufficient documentation

## 2018-04-22 DIAGNOSIS — Z8673 Personal history of transient ischemic attack (TIA), and cerebral infarction without residual deficits: Secondary | ICD-10-CM | POA: Insufficient documentation

## 2018-04-22 DIAGNOSIS — Z87891 Personal history of nicotine dependence: Secondary | ICD-10-CM | POA: Insufficient documentation

## 2018-04-22 DIAGNOSIS — J449 Chronic obstructive pulmonary disease, unspecified: Secondary | ICD-10-CM | POA: Insufficient documentation

## 2018-04-22 DIAGNOSIS — Z86718 Personal history of other venous thrombosis and embolism: Secondary | ICD-10-CM | POA: Insufficient documentation

## 2018-04-22 DIAGNOSIS — E1169 Type 2 diabetes mellitus with other specified complication: Secondary | ICD-10-CM | POA: Insufficient documentation

## 2018-04-22 DIAGNOSIS — I10 Essential (primary) hypertension: Secondary | ICD-10-CM | POA: Insufficient documentation

## 2018-04-22 DIAGNOSIS — Z6837 Body mass index (BMI) 37.0-37.9, adult: Secondary | ICD-10-CM | POA: Insufficient documentation

## 2018-10-11 ENCOUNTER — Other Ambulatory Visit (HOSPITAL_COMMUNITY)
Admission: RE | Admit: 2018-10-11 | Discharge: 2018-10-11 | Disposition: A | Discharge status: Home / Self Care | Payer: Medicare PPO | Source: Ambulatory Visit | Attending: Family Medicine | Admitting: Family Medicine

## 2018-10-11 DIAGNOSIS — X58XXXD Exposure to other specified factors, subsequent encounter: Secondary | ICD-10-CM | POA: Diagnosis not present

## 2018-10-11 DIAGNOSIS — Z79899 Other long term (current) drug therapy: Secondary | ICD-10-CM | POA: Insufficient documentation

## 2018-10-11 DIAGNOSIS — T8142XD Infection following a procedure, deep incisional surgical site, subsequent encounter: Secondary | ICD-10-CM | POA: Insufficient documentation

## 2018-10-11 DIAGNOSIS — Z959 Presence of cardiac and vascular implant and graft, unspecified: Secondary | ICD-10-CM | POA: Diagnosis not present

## 2018-10-11 LAB — COMPREHENSIVE METABOLIC PANEL
ALT: 38 U/L (ref 0–44)
AST: 37 U/L (ref 15–41)
Albumin: 4.3 g/dL (ref 3.5–5.0)
Alkaline Phosphatase: 93 U/L (ref 38–126)
Anion gap: 13 (ref 5–15)
BUN: 15 mg/dL (ref 6–20)
CO2: 25 mmol/L (ref 22–32)
Calcium: 9.8 mg/dL (ref 8.9–10.3)
Chloride: 95 mmol/L — ABNORMAL LOW (ref 98–111)
Creatinine, Ser: 0.95 mg/dL (ref 0.61–1.24)
GFR calc Af Amer: >60 mL/min (ref >60)
GFR calc non Af Amer: >60 mL/min (ref >60)
Glucose, Bld: 373 mg/dL — ABNORMAL HIGH (ref 70–99)
Potassium: 4.6 mmol/L (ref 3.5–5.1)
Sodium: 133 mmol/L — ABNORMAL LOW (ref 135–145)
Total Bilirubin: 0.4 mg/dL (ref 0.3–1.2)
Total Protein: 7.3 g/dL (ref 6.5–8.1)

## 2018-10-11 LAB — CBC WITH DIFFERENTIAL/PLATELET
Abs Immature Granulocytes: 0.03 10*3/uL (ref 0.00–0.07)
Basophils Absolute: 0.1 10*3/uL (ref 0.0–0.1)
Basophils Relative: 1 %
Eosinophils Absolute: 0.1 10*3/uL (ref 0.0–0.5)
Eosinophils Relative: 2 %
HCT: 45.6 % (ref 39.0–52.0)
Hemoglobin: 15.1 g/dL (ref 13.0–17.0)
Immature Granulocytes: 0 %
Lymphocytes Relative: 24 %
Lymphs Abs: 2.1 10*3/uL (ref 0.7–4.0)
MCH: 28.8 pg (ref 26.0–34.0)
MCHC: 33.1 g/dL (ref 30.0–36.0)
MCV: 86.9 fL (ref 80.0–100.0)
Monocytes Absolute: 0.6 10*3/uL (ref 0.1–1.0)
Monocytes Relative: 7 %
Neutro Abs: 5.7 10*3/uL (ref 1.7–7.7)
Neutrophils Relative %: 66 %
Platelets: 187 10*3/uL (ref 150–400)
RBC: 5.25 MIL/uL (ref 4.22–5.81)
RDW: 14.1 % (ref 11.5–15.5)
WBC: 8.6 10*3/uL (ref 4.0–10.5)
nRBC: 0 % (ref 0.0–0.2)

## 2018-10-11 LAB — SEDIMENTATION RATE: Sed Rate: 5 mm/hr (ref 0–16)

## 2018-10-11 LAB — C-REACTIVE PROTEIN: CRP: 2.6 mg/dL — ABNORMAL HIGH (ref <1.0)

## 2019-04-24 ENCOUNTER — Other Ambulatory Visit (HOSPITAL_COMMUNITY): Payer: Self-pay | Admitting: Nephrology

## 2019-04-24 ENCOUNTER — Other Ambulatory Visit: Payer: Self-pay | Admitting: Nephrology

## 2019-04-24 DIAGNOSIS — R809 Proteinuria, unspecified: Secondary | ICD-10-CM

## 2019-05-01 ENCOUNTER — Other Ambulatory Visit: Payer: Self-pay

## 2019-05-01 ENCOUNTER — Ambulatory Visit (HOSPITAL_COMMUNITY)
Admission: RE | Admit: 2019-05-01 | Discharge: 2019-05-01 | Disposition: A | Discharge status: Home / Self Care | Payer: Medicare PPO | Source: Ambulatory Visit | Attending: Nephrology | Admitting: Nephrology

## 2019-05-01 DIAGNOSIS — R809 Proteinuria, unspecified: Secondary | ICD-10-CM | POA: Insufficient documentation

## 2019-10-16 NOTE — Telephone Encounter (Signed)
Opened in error

## 2020-03-17 IMAGING — CT CT HEAD W/O CM
3 series · 15 of 47 positions shown, 18 images · non-contrast
Comparison: Prior CT and MRI from 12/03/2017.

CLINICAL DATA: Initial evaluation for increased confusion,
unresponsiveness.

EXAM:
CT HEAD WITHOUT CONTRAST
TECHNIQUE: Contiguous axial images were obtained from the base of the skull
through the vertex without intravenous contrast.

[Series 3: head trauma wo · axial · 0.44mm/px · z∈[+1726,+1856]mm · 9 of 32 slices shown, 12 images]
[im 3/32  brain]
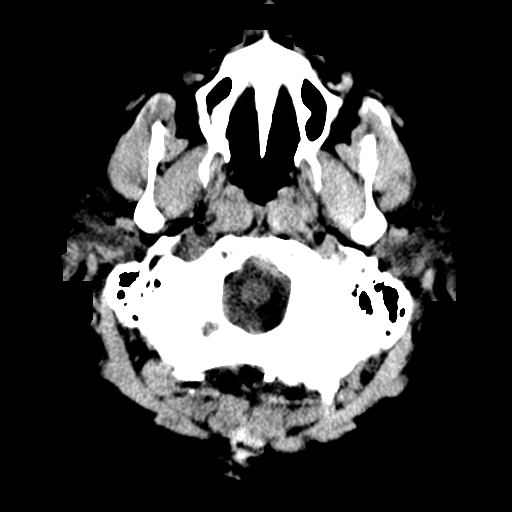
[im 3/32  bone]
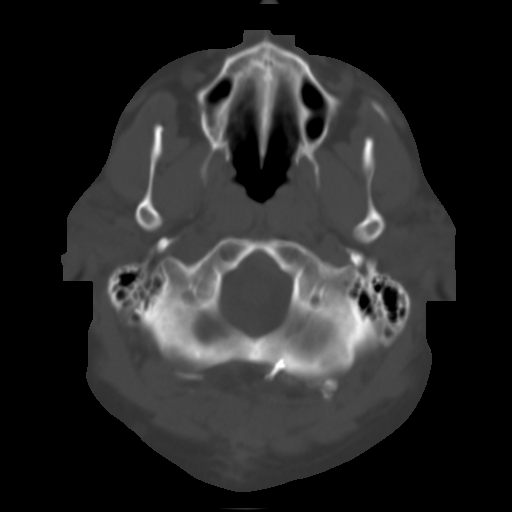
[im 6/32  brain]
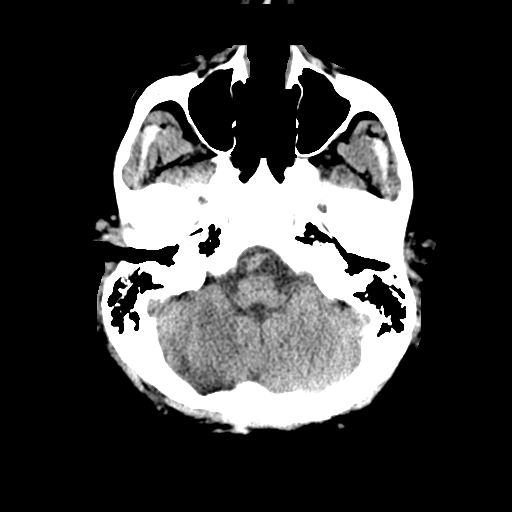
[im 9/32  brain]
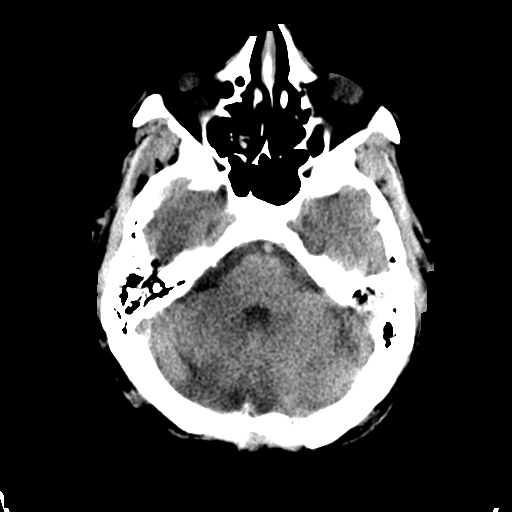
[im 12/32  brain]
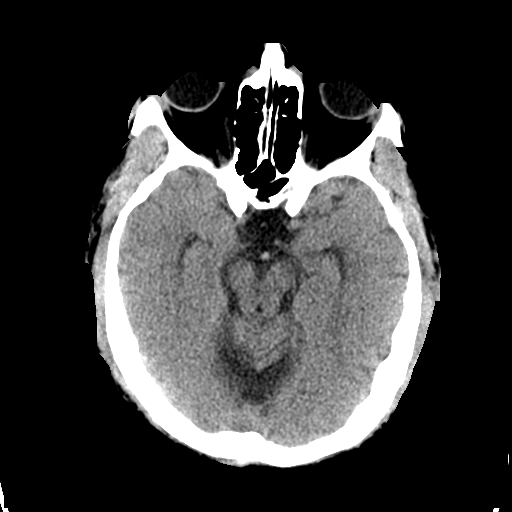
[im 17/32  brain]
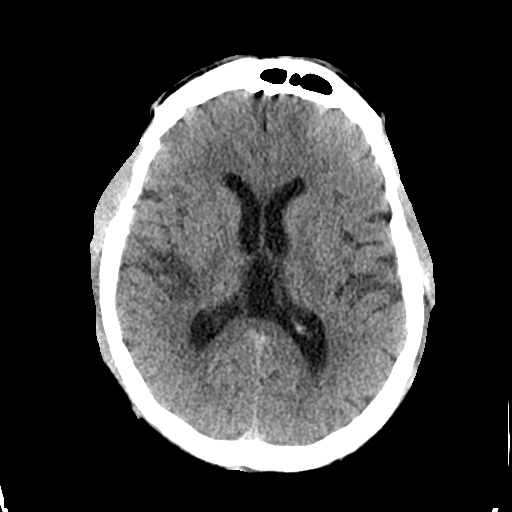
[im 17/32  bone]
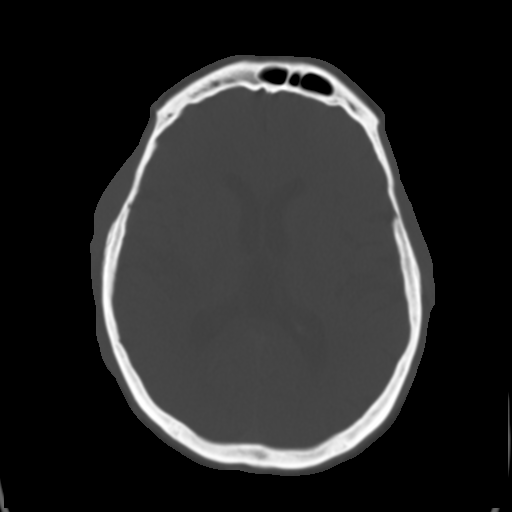
[im 20/32  brain]
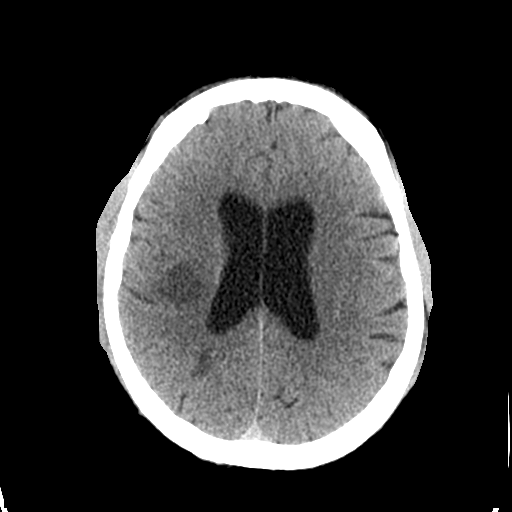
[im 23/32  brain]
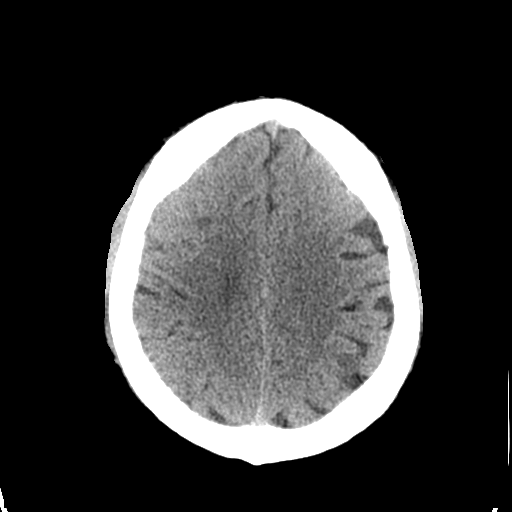
[im 26/32  brain]
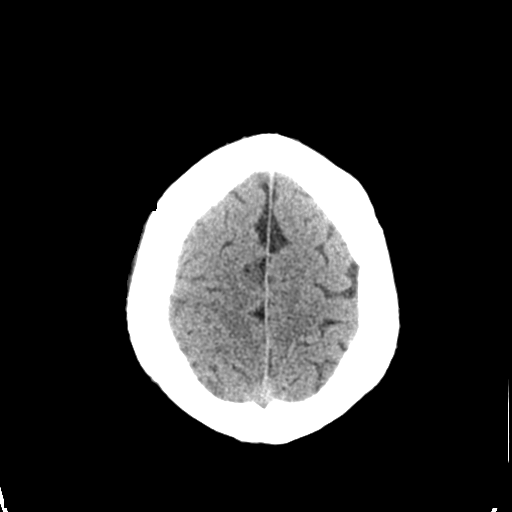
[im 29/32  brain]
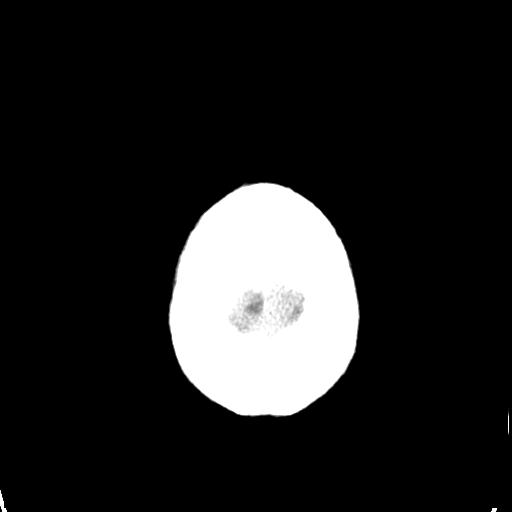
[im 29/32  bone]
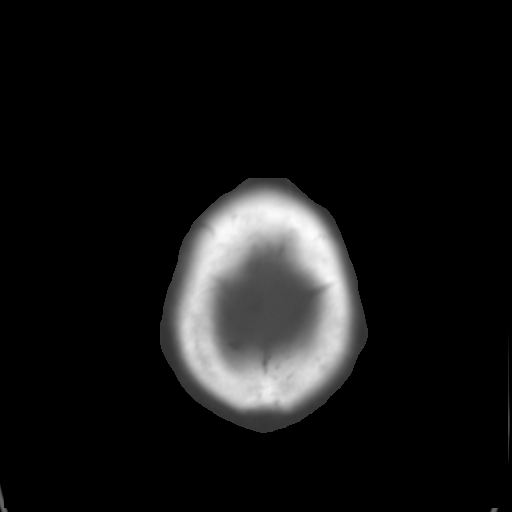

[Series 5: coronal soft tissue · coronal · 0.33mm/px · 3 of 72 slices shown]
[im 24/72  brain]
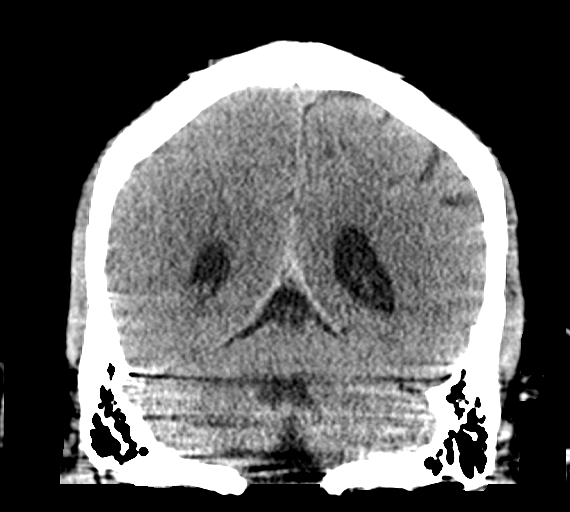
[im 32/72  brain]
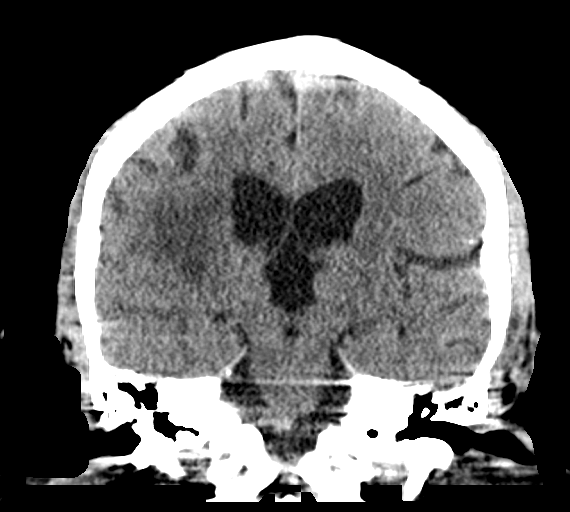
[im 40/72  brain]
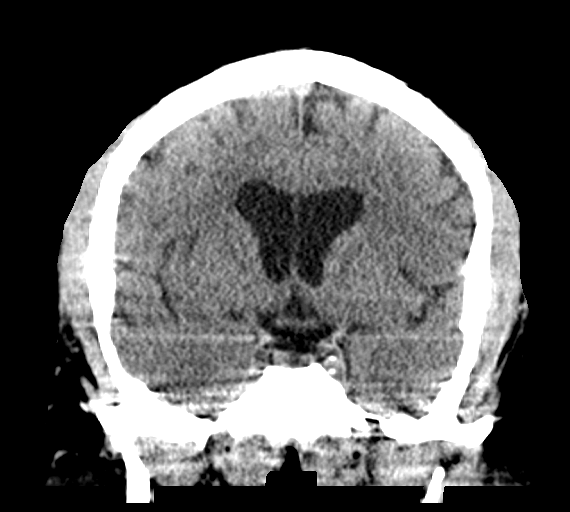

[Series 6: sagittal soft tissue · sagittal · 0.32mm/px · 3 of 57 slices shown]
[im 19/57  brain]
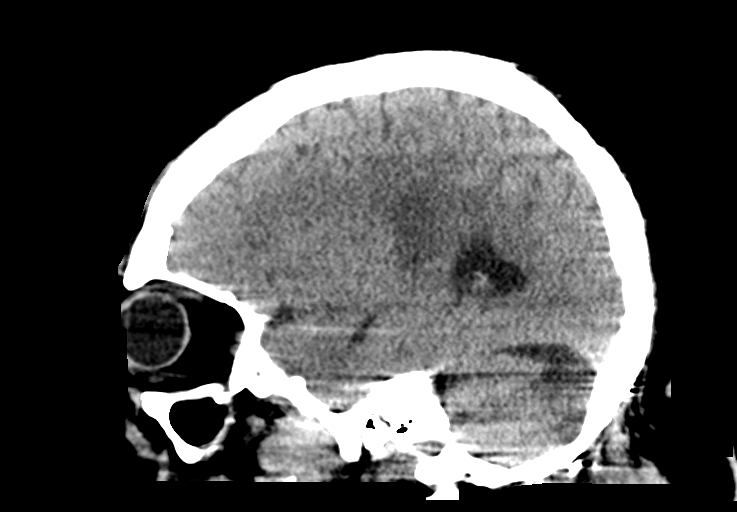
[im 29/57  brain]
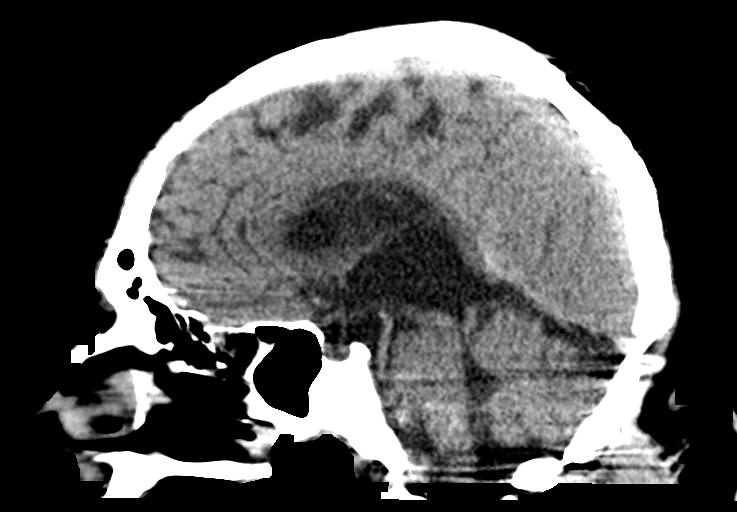
[im 38/57  brain]
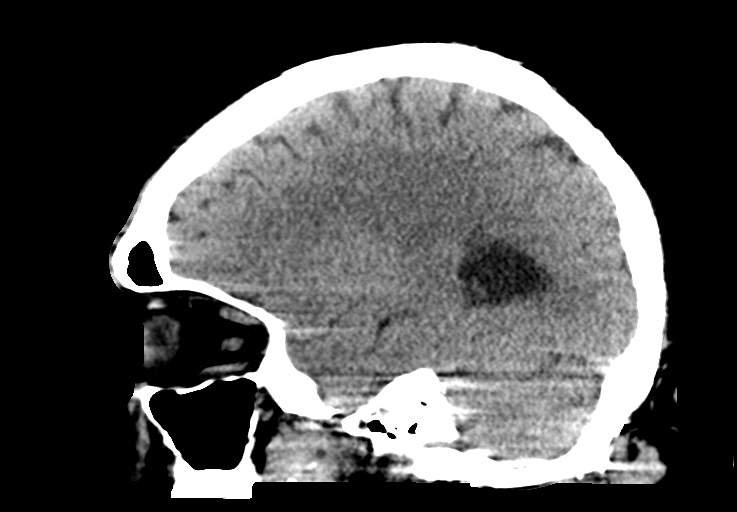

[15 of 47 positions shown; findings below may reference images not displayed]

FINDINGS: Brain: Scattered areas of evolving right MCA infarction again seen,
stable in size and distribution from previous. No evidence for
hemorrhagic transformation or significant mass effect. No acute
intracranial hemorrhage. No new large vessel territory infarct
identified. No mass lesion or midline shift. Stable ventricular size
and morphology without hydrocephalus. No extra-axial fluid
collection.

Prior right occipital craniotomy with underlying encephalomalacia,
stable.

Vascular: No hyperdense vessel. Scattered vascular calcifications
noted within the carotid siphons.

Skull: Scalp soft tissues within normal limits. Prior right
occipital craniotomy.

Sinuses/Orbits: Globes and orbital soft tissues demonstrate no acute
finding. Mild scattered mucosal thickening within the ethmoidal air
cells. Paranasal sinuses are otherwise clear. No mastoid effusion.
IMPRESSION: 1. Continued interval evolution of patchy right MCA territory
infarcts. No evidence for hemorrhagic transformation.
2. No other new acute intracranial abnormality.

## 2021-02-16 ENCOUNTER — Other Ambulatory Visit: Payer: Self-pay

## 2021-02-16 ENCOUNTER — Emergency Department: Payer: Medicare HMO

## 2021-02-16 ENCOUNTER — Inpatient Hospital Stay
Admission: EM | Admit: 2021-02-16 | Discharge: 2021-02-20 | DRG: 871 | Disposition: A | Discharge status: Home Health | Payer: Medicare HMO | Attending: Internal Medicine | Admitting: Internal Medicine

## 2021-02-16 DIAGNOSIS — J069 Acute upper respiratory infection, unspecified: Secondary | ICD-10-CM | POA: Diagnosis not present

## 2021-02-16 DIAGNOSIS — Z79899 Other long term (current) drug therapy: Secondary | ICD-10-CM | POA: Diagnosis not present

## 2021-02-16 DIAGNOSIS — I639 Cerebral infarction, unspecified: Secondary | ICD-10-CM | POA: Diagnosis present

## 2021-02-16 DIAGNOSIS — I82532 Chronic embolism and thrombosis of left popliteal vein: Secondary | ICD-10-CM | POA: Diagnosis present

## 2021-02-16 DIAGNOSIS — I10 Essential (primary) hypertension: Secondary | ICD-10-CM | POA: Diagnosis present

## 2021-02-16 DIAGNOSIS — R569 Unspecified convulsions: Secondary | ICD-10-CM

## 2021-02-16 DIAGNOSIS — R6520 Severe sepsis without septic shock: Secondary | ICD-10-CM | POA: Diagnosis present

## 2021-02-16 DIAGNOSIS — I825Y2 Chronic embolism and thrombosis of unspecified deep veins of left proximal lower extremity: Secondary | ICD-10-CM

## 2021-02-16 DIAGNOSIS — J984 Other disorders of lung: Secondary | ICD-10-CM | POA: Diagnosis present

## 2021-02-16 DIAGNOSIS — G4733 Obstructive sleep apnea (adult) (pediatric): Secondary | ICD-10-CM | POA: Diagnosis present

## 2021-02-16 DIAGNOSIS — E871 Hypo-osmolality and hyponatremia: Secondary | ICD-10-CM | POA: Diagnosis present

## 2021-02-16 DIAGNOSIS — Z6841 Body Mass Index (BMI) 40.0 and over, adult: Secondary | ICD-10-CM

## 2021-02-16 DIAGNOSIS — E669 Obesity, unspecified: Secondary | ICD-10-CM

## 2021-02-16 DIAGNOSIS — Z833 Family history of diabetes mellitus: Secondary | ICD-10-CM

## 2021-02-16 DIAGNOSIS — Z885 Allergy status to narcotic agent status: Secondary | ICD-10-CM

## 2021-02-16 DIAGNOSIS — Z87891 Personal history of nicotine dependence: Secondary | ICD-10-CM

## 2021-02-16 DIAGNOSIS — Z7982 Long term (current) use of aspirin: Secondary | ICD-10-CM

## 2021-02-16 DIAGNOSIS — Z66 Do not resuscitate: Secondary | ICD-10-CM | POA: Diagnosis present

## 2021-02-16 DIAGNOSIS — D696 Thrombocytopenia, unspecified: Secondary | ICD-10-CM | POA: Diagnosis present

## 2021-02-16 DIAGNOSIS — E1169 Type 2 diabetes mellitus with other specified complication: Secondary | ICD-10-CM

## 2021-02-16 DIAGNOSIS — I69354 Hemiplegia and hemiparesis following cerebral infarction affecting left non-dominant side: Secondary | ICD-10-CM | POA: Diagnosis not present

## 2021-02-16 DIAGNOSIS — I82409 Acute embolism and thrombosis of unspecified deep veins of unspecified lower extremity: Secondary | ICD-10-CM | POA: Diagnosis present

## 2021-02-16 DIAGNOSIS — Z7901 Long term (current) use of anticoagulants: Secondary | ICD-10-CM | POA: Diagnosis not present

## 2021-02-16 DIAGNOSIS — Z794 Long term (current) use of insulin: Secondary | ICD-10-CM

## 2021-02-16 DIAGNOSIS — E119 Type 2 diabetes mellitus without complications: Secondary | ICD-10-CM | POA: Diagnosis present

## 2021-02-16 DIAGNOSIS — E785 Hyperlipidemia, unspecified: Secondary | ICD-10-CM | POA: Diagnosis present

## 2021-02-16 DIAGNOSIS — Z7902 Long term (current) use of antithrombotics/antiplatelets: Secondary | ICD-10-CM

## 2021-02-16 DIAGNOSIS — A419 Sepsis, unspecified organism: Secondary | ICD-10-CM | POA: Diagnosis not present

## 2021-02-16 DIAGNOSIS — Z7951 Long term (current) use of inhaled steroids: Secondary | ICD-10-CM

## 2021-02-16 DIAGNOSIS — R0902 Hypoxemia: Secondary | ICD-10-CM

## 2021-02-16 DIAGNOSIS — M1990 Unspecified osteoarthritis, unspecified site: Secondary | ICD-10-CM | POA: Diagnosis present

## 2021-02-16 DIAGNOSIS — J1282 Pneumonia due to coronavirus disease 2019: Secondary | ICD-10-CM | POA: Diagnosis present

## 2021-02-16 DIAGNOSIS — I824Y2 Acute embolism and thrombosis of unspecified deep veins of left proximal lower extremity: Secondary | ICD-10-CM | POA: Diagnosis not present

## 2021-02-16 DIAGNOSIS — U071 COVID-19: Secondary | ICD-10-CM | POA: Diagnosis present

## 2021-02-16 DIAGNOSIS — A4189 Other specified sepsis: Secondary | ICD-10-CM | POA: Diagnosis present

## 2021-02-16 DIAGNOSIS — Z8249 Family history of ischemic heart disease and other diseases of the circulatory system: Secondary | ICD-10-CM

## 2021-02-16 DIAGNOSIS — J9601 Acute respiratory failure with hypoxia: Secondary | ICD-10-CM | POA: Diagnosis present

## 2021-02-16 DIAGNOSIS — Z888 Allergy status to other drugs, medicaments and biological substances status: Secondary | ICD-10-CM

## 2021-02-16 LAB — TROPONIN I (HIGH SENSITIVITY)
Troponin I (High Sensitivity): 15 ng/L (ref <18)
Troponin I (High Sensitivity): 12 ng/L (ref <18)

## 2021-02-16 LAB — CBC
HCT: 38.5 % — ABNORMAL LOW (ref 39.0–52.0)
Hemoglobin: 13.5 g/dL (ref 13.0–17.0)
MCH: 29.5 pg (ref 26.0–34.0)
MCHC: 35.1 g/dL (ref 30.0–36.0)
MCV: 84.1 fL (ref 80.0–100.0)
Platelets: 120 10*3/uL — ABNORMAL LOW (ref 150–400)
RBC: 4.58 MIL/uL (ref 4.22–5.81)
RDW: 13.9 % (ref 11.5–15.5)
WBC: 5.2 10*3/uL (ref 4.0–10.5)
nRBC: 0 % (ref 0.0–0.2)

## 2021-02-16 LAB — CBG MONITORING, ED
Glucose-Capillary: 300 mg/dL — ABNORMAL HIGH (ref 70–99)
Glucose-Capillary: 421 mg/dL — ABNORMAL HIGH (ref 70–99)

## 2021-02-16 LAB — BASIC METABOLIC PANEL
Anion gap: 12 (ref 5–15)
BUN: 16 mg/dL (ref 6–20)
CO2: 25 mmol/L (ref 22–32)
Calcium: 9.4 mg/dL (ref 8.9–10.3)
Chloride: 95 mmol/L — ABNORMAL LOW (ref 98–111)
Creatinine, Ser: 1.06 mg/dL (ref 0.61–1.24)
GFR, Estimated: >60 mL/min (ref >60)
Glucose, Bld: 263 mg/dL — ABNORMAL HIGH (ref 70–99)
Potassium: 4.5 mmol/L (ref 3.5–5.1)
Sodium: 132 mmol/L — ABNORMAL LOW (ref 135–145)

## 2021-02-16 LAB — APTT: aPTT: 30 seconds (ref 24–36)

## 2021-02-16 LAB — LACTIC ACID, PLASMA
Lactic Acid, Venous: 2.1 mmol/L (ref 0.5–1.9)
Lactic Acid, Venous: 2.3 mmol/L (ref 0.5–1.9)
Lactic Acid, Venous: 3.9 mmol/L (ref 0.5–1.9)

## 2021-02-16 LAB — GLUCOSE, CAPILLARY: Glucose-Capillary: 404 mg/dL — ABNORMAL HIGH (ref 70–99)

## 2021-02-16 LAB — PROCALCITONIN: Procalcitonin: 0.15 ng/mL

## 2021-02-16 LAB — RESP PANEL BY RT-PCR (FLU A&B, COVID) ARPGX2
Influenza A by PCR: NEGATIVE
Influenza B by PCR: NEGATIVE
SARS Coronavirus 2 by RT PCR: POSITIVE — AB

## 2021-02-16 LAB — PROTIME-INR
INR: 1.1 (ref 0.8–1.2)
Prothrombin Time: 14.2 seconds (ref 11.4–15.2)

## 2021-02-16 MED ORDER — TRAMADOL HCL 50 MG PO TABS
25.0000 mg | ORAL_TABLET | Freq: Four times a day (QID) | ORAL | Status: DC | PRN
  Administered 2021-02-16 – 2021-02-20 (×8): 50 mg via ORAL
  Filled 2021-02-16 (×8): qty 1

## 2021-02-16 MED ORDER — ATORVASTATIN CALCIUM 20 MG PO TABS
80.0000 mg | ORAL_TABLET | Freq: Every day | ORAL | Status: DC
  Administered 2021-02-17 – 2021-02-20 (×5): 80 mg via ORAL
  Filled 2021-02-16 (×5): qty 4

## 2021-02-16 MED ORDER — LORAZEPAM 2 MG/ML IJ SOLN: 1.0000 mg | INTRAMUSCULAR | Status: DC | PRN

## 2021-02-16 MED ORDER — PREGABALIN 75 MG PO CAPS
75.0000 mg | ORAL_CAPSULE | Freq: Two times a day (BID) | ORAL | Status: DC
  Administered 2021-02-16 – 2021-02-20 (×8): 75 mg via ORAL
  Filled 2021-02-16 (×9): qty 1

## 2021-02-16 MED ORDER — SODIUM CHLORIDE 0.9 % IV SOLN
200.0000 mg | Freq: Once | INTRAVENOUS | Status: AC
  Administered 2021-02-16: 200 mg via INTRAVENOUS
  Filled 2021-02-16: qty 200

## 2021-02-16 MED ORDER — INSULIN ASPART 100 UNIT/ML IJ SOLN
0.0000 [IU] | Freq: Three times a day (TID) | INTRAMUSCULAR | Status: DC
  Administered 2021-02-16: 9 [IU] via SUBCUTANEOUS
  Administered 2021-02-17: 5 [IU] via SUBCUTANEOUS
  Filled 2021-02-16 (×2): qty 1

## 2021-02-16 MED ORDER — SENNOSIDES-DOCUSATE SODIUM 8.6-50 MG PO TABS
1.0000 | ORAL_TABLET | Freq: Every day | ORAL | Status: DC
  Administered 2021-02-16 – 2021-02-19 (×4): 1 via ORAL
  Filled 2021-02-16 (×5): qty 1

## 2021-02-16 MED ORDER — METHYLPREDNISOLONE SODIUM SUCC 125 MG IJ SOLR
125.0000 mg | Freq: Once | INTRAMUSCULAR | Status: AC
  Administered 2021-02-16: 125 mg via INTRAMUSCULAR
  Filled 2021-02-16: qty 2

## 2021-02-16 MED ORDER — DM-GUAIFENESIN ER 30-600 MG PO TB12
1.0000 | ORAL_TABLET | Freq: Two times a day (BID) | ORAL | Status: DC | PRN
  Administered 2021-02-17 – 2021-02-19 (×2): 1 via ORAL
  Filled 2021-02-16 (×2): qty 1

## 2021-02-16 MED ORDER — CYCLOBENZAPRINE HCL 10 MG PO TABS
10.0000 mg | ORAL_TABLET | Freq: Two times a day (BID) | ORAL | Status: DC | PRN
  Administered 2021-02-17 (×2): 10 mg via ORAL
  Filled 2021-02-16 (×2): qty 1

## 2021-02-16 MED ORDER — MOMETASONE FURO-FORMOTEROL FUM 200-5 MCG/ACT IN AERO
2.0000 | INHALATION_SPRAY | Freq: Two times a day (BID) | RESPIRATORY_TRACT | Status: DC
  Administered 2021-02-16 – 2021-02-20 (×8): 2 via RESPIRATORY_TRACT
  Filled 2021-02-16: qty 8.8

## 2021-02-16 MED ORDER — METHYLPREDNISOLONE SODIUM SUCC 125 MG IJ SOLR
60.0000 mg | Freq: Two times a day (BID) | INTRAMUSCULAR | Status: DC
  Filled 2021-02-16 (×2): qty 2

## 2021-02-16 MED ORDER — IOHEXOL 350 MG/ML SOLN
80.0000 mL | Freq: Once | INTRAVENOUS | Status: AC | PRN
  Administered 2021-02-16: 80 mL via INTRAVENOUS

## 2021-02-16 MED ORDER — SODIUM CHLORIDE 0.9 % IV SOLN
100.0000 mg | Freq: Every day | INTRAVENOUS | Status: AC
  Administered 2021-02-17 – 2021-02-20 (×4): 100 mg via INTRAVENOUS
  Filled 2021-02-16 (×3): qty 100
  Filled 2021-02-16: qty 20

## 2021-02-16 MED ORDER — IPRATROPIUM-ALBUTEROL 0.5-2.5 (3) MG/3ML IN SOLN
3.0000 mL | Freq: Once | RESPIRATORY_TRACT | Status: AC
  Administered 2021-02-16: 3 mL via RESPIRATORY_TRACT
  Filled 2021-02-16: qty 3

## 2021-02-16 MED ORDER — AMLODIPINE BESYLATE 10 MG PO TABS
10.0000 mg | ORAL_TABLET | Freq: Every day | ORAL | Status: DC
  Administered 2021-02-17 – 2021-02-20 (×5): 10 mg via ORAL
  Filled 2021-02-16 (×5): qty 1

## 2021-02-16 MED ORDER — ACETAMINOPHEN 325 MG PO TABS: 650.0000 mg | ORAL_TABLET | Freq: Four times a day (QID) | ORAL | Status: DC | PRN

## 2021-02-16 MED ORDER — HEPARIN (PORCINE) 25000 UT/250ML-% IV SOLN
2150.0000 [IU]/h | INTRAVENOUS | Status: DC
  Administered 2021-02-16: 1900 [IU]/h via INTRAVENOUS
  Administered 2021-02-17: 2150 [IU]/h via INTRAVENOUS
  Administered 2021-02-17: 2100 [IU]/h via INTRAVENOUS
  Administered 2021-02-18: 2150 [IU]/h via INTRAVENOUS
  Filled 2021-02-16 (×4): qty 250

## 2021-02-16 MED ORDER — INSULIN ASPART 100 UNIT/ML IJ SOLN
0.0000 [IU] | Freq: Every day | INTRAMUSCULAR | Status: DC
Start: 2021-02-16 — End: 2021-02-17
  Administered 2021-02-17: 5 [IU] via SUBCUTANEOUS
  Filled 2021-02-16: qty 1

## 2021-02-16 MED ORDER — INSULIN GLARGINE-YFGN 100 UNIT/ML ~~LOC~~ SOLN: 30.0000 [IU] | Freq: Every day | SUBCUTANEOUS | Status: DC

## 2021-02-16 MED ORDER — TAMSULOSIN HCL 0.4 MG PO CAPS
0.4000 mg | ORAL_CAPSULE | Freq: Every day | ORAL | Status: DC
  Administered 2021-02-16 – 2021-02-20 (×5): 0.4 mg via ORAL
  Filled 2021-02-16 (×6): qty 1

## 2021-02-16 MED ORDER — ASPIRIN EC 81 MG PO TBEC
81.0000 mg | DELAYED_RELEASE_TABLET | Freq: Every day | ORAL | Status: DC
  Administered 2021-02-17: 81 mg via ORAL
  Filled 2021-02-16: qty 1

## 2021-02-16 MED ORDER — IPRATROPIUM BROMIDE HFA 17 MCG/ACT IN AERS
2.0000 | INHALATION_SPRAY | RESPIRATORY_TRACT | Status: DC
  Administered 2021-02-17 – 2021-02-20 (×19): 2 via RESPIRATORY_TRACT
  Filled 2021-02-16 (×3): qty 12.9

## 2021-02-16 MED ORDER — INSULIN GLARGINE-YFGN 100 UNIT/ML ~~LOC~~ SOLN
45.0000 [IU] | Freq: Every day | SUBCUTANEOUS | Status: DC
  Administered 2021-02-16: 45 [IU] via SUBCUTANEOUS
  Filled 2021-02-16 (×2): qty 0.45

## 2021-02-16 MED ORDER — SODIUM CHLORIDE 0.9 % IV SOLN
1.0000 g | Freq: Once | INTRAVENOUS | Status: AC
  Administered 2021-02-16: 1 g via INTRAVENOUS
  Filled 2021-02-16: qty 10

## 2021-02-16 MED ORDER — SODIUM CHLORIDE 0.9 % IV BOLUS
1000.0000 mL | Freq: Once | INTRAVENOUS | Status: AC
  Administered 2021-02-16: 1000 mL via INTRAVENOUS

## 2021-02-16 MED ORDER — LORATADINE 10 MG PO TABS
10.0000 mg | ORAL_TABLET | Freq: Every day | ORAL | Status: DC
  Administered 2021-02-17 – 2021-02-20 (×4): 10 mg via ORAL
  Filled 2021-02-16 (×4): qty 1

## 2021-02-16 MED ORDER — ASPIRIN 81 MG PO CHEW
324.0000 mg | CHEWABLE_TABLET | Freq: Once | ORAL | Status: AC
  Administered 2021-02-16: 324 mg via ORAL
  Filled 2021-02-16: qty 4

## 2021-02-16 MED ORDER — FLUOXETINE HCL 20 MG PO CAPS
40.0000 mg | ORAL_CAPSULE | Freq: Every day | ORAL | Status: DC
  Administered 2021-02-17 – 2021-02-20 (×4): 40 mg via ORAL
  Filled 2021-02-16 (×4): qty 2

## 2021-02-16 MED ORDER — TOPIRAMATE 25 MG PO TABS
25.0000 mg | ORAL_TABLET | Freq: Two times a day (BID) | ORAL | Status: DC
  Administered 2021-02-17 – 2021-02-19 (×4): 25 mg via ORAL
  Filled 2021-02-16 (×8): qty 1

## 2021-02-16 MED ORDER — SODIUM CHLORIDE 0.9 % IV SOLN
500.0000 mg | Freq: Once | INTRAVENOUS | Status: AC
  Administered 2021-02-16: 500 mg via INTRAVENOUS
  Filled 2021-02-16: qty 500

## 2021-02-16 MED ORDER — ONDANSETRON HCL 4 MG/2ML IJ SOLN: 4.0000 mg | Freq: Three times a day (TID) | INTRAMUSCULAR | Status: DC | PRN

## 2021-02-16 MED ORDER — HYDRALAZINE HCL 20 MG/ML IJ SOLN
5.0000 mg | INTRAMUSCULAR | Status: DC | PRN
  Administered 2021-02-19: 5 mg via INTRAVENOUS
  Filled 2021-02-16: qty 1

## 2021-02-16 MED ORDER — HEPARIN BOLUS VIA INFUSION
6000.0000 [IU] | Freq: Once | INTRAVENOUS | Status: AC
  Administered 2021-02-16: 6000 [IU] via INTRAVENOUS
  Filled 2021-02-16: qty 6000

## 2021-02-16 MED ORDER — ALBUTEROL SULFATE HFA 108 (90 BASE) MCG/ACT IN AERS
2.0000 | INHALATION_SPRAY | RESPIRATORY_TRACT | Status: DC | PRN
  Filled 2021-02-16: qty 6.7

## 2021-02-16 MED ORDER — CLOPIDOGREL BISULFATE 75 MG PO TABS
75.0000 mg | ORAL_TABLET | Freq: Every day | ORAL | Status: DC
  Administered 2021-02-17 – 2021-02-20 (×4): 75 mg via ORAL
  Filled 2021-02-16 (×4): qty 1

## 2021-02-16 NOTE — H&P (Addendum)
History and Physical    Zachary Deleon ZOX:[id number] DOB: 1965/10/22 DOA: 02/16/2021  Referring MD/NP/PA:   PCP: Nelma Rothman, NP   Patient coming from:  The patient is coming from home.    Chief Complaint: SOB  HPI: Zachary Deleon is a 55 y.o. 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  Patient states that she started having dry cough and shortness breath since last night which has been progressively worsening.  Patient states that he had some pressure-like chest pain earlier, which has resolved.  Patient has mild fever and chills at home, his temperature is 99.7 in ED.  Patient does not have nausea, vomiting, diarrhea or abdominal pain.  No symptoms of UTI. Pt states that all family members are positive for COVID recently.  Patient states that he had hx of DVT in left leg, and he has worsening pain in left leg today.   ED Course: pt was found to have positive COVID PCR, WBC 5.2, troponin level 15, lactic acid 2.1, 2.3, renal function okay, temperature 99.7, blood pressure 132/77, heart rate 103, 99, RR 29, oxygen saturation 89% on room air, which decreased to lower 80s on exertion.  Currently on 2 L oxygen with 96% saturation. Chest x-ray with patchy bilateral infiltration.  CTA is a limited study, but negative for PE. LE venous doppler showed chronic-appearing, nonocclusive thrombus limited to the left distal femoral vein and popliteal vein. Pt is admitted to MedSurg bed as inpatient.  CTA of chest: 1. Despite the limitations of today's examination, there is no evidence to suggest clinically significant central, lobar or segmental sized pulmonary embolism. 2. Aortic atherosclerosis, in addition to left main and 3 vessel coronary artery  disease. Please note that although the presence of coronary artery calcium documents the presence of coronary artery disease, the severity of this disease and any potential stenosis cannot be assessed on this non-gated CT examination. Assessment for potential risk factor modification, dietary therapy or pharmacologic therapy may be warranted, if clinically indicated. 3. Mild diffuse bronchial wall thickening with mild centrilobular and paraseptal emphysema; imaging findings suggestive of underlying COPD. 4. There are calcifications of the aortic valve. Echocardiographic correlation for evaluation of potential valvular dysfunction may be warranted if clinically indicated. 5. Hepatic steatosis.   Aortic Atherosclerosis (ICD10-I70.0) and Emphysema (ICD10-J43.9).    Review of Systems:   General: no fevers, chills, no body weight gain, has poor appetite, has fatigue HEENT: no blurry vision, hearing changes or sore throat Respiratory: has dyspnea, coughing, no wheezing CV: had chest pain, no palpitations GI: no nausea, vomiting, abdominal pain, diarrhea, constipation GU: no dysuria, burning on urination, increased urinary frequency, hematuria  Ext: has left leg edema and tenderness Neuro: has left sided weakness from previous stroke. no vision change or hearing loss Skin: no rash, no skin tear. MSK: No muscle spasm, no deformity, no limitation of range of movement in spin Heme: No easy bruising.  Travel history: No recent long distant travel.  Allergy:  Allergies  Allergen Reactions   Morphine And Related Other (See Comments)    Makes pt hyper    Past Medical History:  Diagnosis Date   Arthritis    Carotid stenosis    COPD (chronic obstructive pulmonary disease) (HCC)    Depression    Diabetes mellitus without complication (HCC)  DVT (deep venous thrombosis) (HCC)    Hypertension    OSA (obstructive sleep apnea)    Restrictive lung disease    Seizures (HCC)    Status  post carotid endarterectomy    with patch angioplasty   Stroke [facility]) 11/2017    Past Surgical History:  Procedure Laterality Date   BRAIN SURGERY     BRAIN TUMOR EXCISION     performed at [facility]   CATARACT EXTRACTION      Social History:  reports that he has quit smoking. He has never used smokeless tobacco. He reports that he does not currently use alcohol. He reports that he does not currently use drugs.  Family History:  Family History  Problem Relation Age of Onset   Diabetes Mellitus II Father    Hypertension Father    Hypertension Brother      Prior to Admission medications   Medication Sig Start Date End Date Taking? Authorizing Provider  acetaminophen (TYLENOL) 325 MG tablet Take 2 tablets (650 mg total) by mouth every 4 (four) hours as needed for mild pain (or temp > 37.5 C (99.5 F)). 12/20/17   Angiulli, Mcarthur Rossetti, PA-C  albuterol (VENTOLIN HFA) 108 (90 Base) MCG/ACT inhaler Inhale 2 puffs into the lungs every 4 (four) hours as needed for wheezing or shortness of breath. 12/20/17   Angiulli, Mcarthur Rossetti, PA-C  amLODipine (NORVASC) 10 MG tablet Take 1 tablet (10 mg total) by mouth daily. 12/21/17   Angiulli, Mcarthur Rossetti, PA-C  aspirin 325 MG tablet Take 1 tablet (325 mg total) by mouth daily. 12/07/17   Johnson, Clanford L, MD  atorvastatin (LIPITOR) 80 MG tablet Take 1 tablet (80 mg total) by mouth daily. 12/20/17   Angiulli, Mcarthur Rossetti, PA-C  clopidogrel (PLAVIX) 75 MG tablet Take 1 tablet (75 mg total) by mouth daily with breakfast. 12/20/17   Angiulli, Mcarthur Rossetti, PA-C  diclofenac sodium (VOLTAREN) 1 % GEL Apply 2 g topically 4 (four) times daily. 12/20/17   Angiulli, Mcarthur Rossetti, PA-C  fenofibrate 160 MG tablet Take 1 tablet (160 mg total) by mouth daily. 12/21/17   Angiulli, Mcarthur Rossetti, PA-C  FLUoxetine (PROZAC) 20 MG capsule Take 1 capsule (20 mg total) by mouth daily. 12/20/17   Angiulli, Mcarthur Rossetti, PA-C  Fluticasone-Salmeterol (ADVAIR) 250-50 MCG/DOSE AEPB Inhale 1 puff into the lungs 2  (two) times daily. 12/20/17   Angiulli, Mcarthur Rossetti, PA-C  gabapentin (NEURONTIN) 100 MG capsule Take 1 capsule (100 mg total) by mouth 3 (three) times daily. 12/20/17   Angiulli, Mcarthur Rossetti, PA-C  lidocaine (LIDODERM) 5 % Place 1 patch onto the skin daily. Remove & Discard patch within 12 hours or as directed by MD 12/21/17   Angiulli, Mcarthur Rossetti, PA-C  methocarbamol (ROBAXIN) 500 MG tablet Take 1 tablet (500 mg total) by mouth every 8 (eight) hours as needed for muscle spasms. 12/20/17   Angiulli, Mcarthur Rossetti, PA-C  nicotine (NICODERM CQ - DOSED IN MG/24 HOURS) 14 mg/24hr patch 14 mg patch daily x3 weeks then 7 mg patch daily x3 weeks and stop 12/20/17   Angiulli, Mcarthur Rossetti, PA-C  QUEtiapine (SEROQUEL) 25 MG tablet Take 0.5 tablets (12.5 mg total) by mouth 2 (two) times daily as needed (agitation). 12/20/17   Angiulli, Mcarthur Rossetti, PA-C  senna-docusate (SENOKOT-S) 8.6-50 MG tablet Take 1 tablet by mouth at bedtime. 12/06/17   Johnson, Clanford L, MD  tiotropium (SPIRIVA) 18 MCG inhalation capsule Place 18 mcg into inhaler and inhale daily.    [provider]  topiramate (TOPAMAX) 25 MG tablet Take 1 tablet (25 mg total) by mouth 2 (two) times daily. 12/20/17   Angiulli, Mcarthur Rossettianiel J, PA-C  traMADol (ULTRAM) 50 MG tablet Take 0.5-1 tablets (25-50 mg total) by mouth every 6 (six) hours as needed for moderate pain or severe pain. Patient not taking: Reported on 03/15/2018 12/20/17   Charlton AmorAngiulli, Daniel J, PA-C    Physical Exam: Vitals:   02/16/21 1408 02/16/21 1430 02/16/21 1553 02/16/21 1604  BP:  132/77    Pulse: (!) 103 99    Resp:  (!) 21    Temp:      SpO2:  94% (!) 89% 95%  Weight:      Height:       General: Not in acute distress HEENT:       Eyes: PERRL, EOMI, no scleral icterus.       ENT: No discharge from the ears and nose, no pharynx injection, no tonsillar enlargement.        Neck: No JVD, no bruit, no mass felt. Heme: No neck lymph node enlargement. Cardiac: S1/S2, RRR, No murmurs, No gallops or  rubs. Respiratory: Has coarse breathing sound bilaterally GI: Soft, nondistended, nontender, no rebound pain, no organomegaly, BS present. GU: No hematuria Ext: No pitting leg edema bilaterally. 1+DP/PT pulse bilaterally. Musculoskeletal: No joint deformities, No joint redness or warmth, no limitation of ROM in spin. Skin: No rashes.  Neuro: Alert, oriented X3, cranial nerves II-XII grossly intact, has left sided weakness from previous stroke.  Psych: Patient is not psychotic, no suicidal or hemocidal ideation.  Labs on Admission: I have personally reviewed following labs and imaging studies  CBC: Recent Labs  Lab 02/16/21 1055  WBC 5.2  HGB 13.5  HCT 38.5*  MCV 84.1  PLT 120*   Basic Metabolic Panel: Recent Labs  Lab 02/16/21 1055  NA 132*  K 4.5  CL 95*  CO2 25  GLUCOSE 263*  BUN 16  CREATININE 1.06  CALCIUM 9.4   GFR: Estimated Creatinine Clearance: 119.2 mL/min (by C-G formula based on SCr of 1.06 mg/dL). Liver Function Tests: No results for input(s): AST, ALT, ALKPHOS, BILITOT, PROT, ALBUMIN in the last 168 hours. No results for input(s): LIPASE, AMYLASE in the last 168 hours. No results for input(s): AMMONIA in the last 168 hours. Coagulation Profile: No results for input(s): INR, PROTIME in the last 168 hours. Cardiac Enzymes: No results for input(s): CKTOTAL, CKMB, CKMBINDEX, TROPONINI in the last 168 hours. BNP (last 3 results) No results for input(s): PROBNP in the last 8760 hours. HbA1C: No results for input(s): HGBA1C in the last 72 hours. CBG: Recent Labs  Lab 02/16/21 1426  GLUCAP 300*   Lipid Profile: No results for input(s): CHOL, HDL, LDLCALC, TRIG, CHOLHDL, LDLDIRECT in the last 72 hours. Thyroid Function Tests: No results for input(s): TSH, T4TOTAL, FREET4, T3FREE, THYROIDAB in the last 72 hours. Anemia Panel: No results for input(s): VITAMINB12, FOLATE, FERRITIN, TIBC, IRON, RETICCTPCT in the last 72 hours. Urine analysis:     Component Value Date/Time   COLORURINE YELLOW 12/02/2017 2054   APPEARANCEUR CLEAR 12/02/2017 2054   LABSPEC 1.028 12/02/2017 2054   PHURINE 5.0 12/02/2017 2054   GLUCOSEU >=500 (A) 12/02/2017 2054   HGBUR NEGATIVE 12/02/2017 2054   BILIRUBINUR NEGATIVE 12/02/2017 2054   KETONESUR NEGATIVE 12/02/2017 2054   PROTEINUR 100 (A) 12/02/2017 2054   NITRITE NEGATIVE 12/02/2017 2054   LEUKOCYTESUR NEGATIVE 12/02/2017 2054   Sepsis Labs: @LABRCNTIP (procalcitonin:4,lacticidven:4) ) Recent  Results (from the past 240 hour(s))  Resp Panel by RT-PCR (Flu A&B, Covid) Nasopharyngeal Swab     Status: Abnormal   Collection Time: 02/16/21 11:52 AM   Specimen: Nasopharyngeal Swab; Nasopharyngeal(NP) swabs in vial transport medium  Result Value Ref Range Status   SARS Coronavirus 2 by RT PCR POSITIVE (A) NEGATIVE Final    Comment: RESULT CALLED TO, READ BACK BY AND VERIFIED WITH: SAM HAMILTON,RN AT 1308 ON 02/16/21 BY GM (NOTE) SARS-CoV-2 target nucleic acids are DETECTED.  The SARS-CoV-2 RNA is generally detectable in upper respiratory specimens during the acute phase of infection. Positive results are indicative of the presence of the identified virus, but do not rule out bacterial infection or co-infection with other pathogens not detected by the test. Clinical correlation with patient history and other diagnostic information is necessary to determine patient infection status. The expected result is Negative.  Fact Sheet for Patients: BloggerCourse.com  Fact Sheet for Healthcare Providers: SeriousBroker.it  This test is not yet approved or cleared by the [place] FDA and  has been authorized for detection and/or diagnosis of SARS-CoV-2 by FDA under an Emergency Use Authorization (EUA).  This EUA will remain in effect (meaning this test  can be used) for the duration of  the COVID-19 declaration under Section 564(b)(1) of the Act,  21 U.S.C. section 360bbb-3(b)(1), unless the authorization is terminated or revoked sooner.     Influenza A by PCR NEGATIVE NEGATIVE Final   Influenza B by PCR NEGATIVE NEGATIVE Final    Comment: (NOTE) The Xpert Xpress SARS-CoV-2/FLU/RSV plus assay is intended as an aid in the diagnosis of influenza from Nasopharyngeal swab specimens and should not be used as a sole basis for treatment. Nasal washings and aspirates are unacceptable for Xpert Xpress SARS-CoV-2/FLU/RSV testing.  Fact Sheet for Patients: BloggerCourse.com  Fact Sheet for Healthcare Providers: SeriousBroker.it  This test is not yet approved or cleared by the [place] FDA and has been authorized for detection and/or diagnosis of SARS-CoV-2 by FDA under an Emergency Use Authorization (EUA). This EUA will remain in effect (meaning this test can be used) for the duration of the COVID-19 declaration under Section 564(b)(1) of the Act, 21 U.S.C. section 360bbb-3(b)(1), unless the authorization is terminated or revoked.  Performed at [facility], [address] [place], [place] [address]      Radiological Exams on Admission: DG Chest 2 View  Result Date: 02/16/2021 CLINICAL DATA:  Cough, congestion shortness of breath. EXAM: CHEST - 2 VIEW COMPARISON:  03/29/2018 FINDINGS: The cardiac silhouette, mediastinal and hilar contours are within limits and stable. Patchy interstitial infiltrates could suggest COVID pneumonia. No focal airspace consolidation or pulmonary lesions. IMPRESSION: Patchy interstitial infiltrates could suggest COVID pneumonia. Electronically Signed   By: Rudie Meyer M.D.   On: 02/16/2021 11:53   CT Angio Chest PE W and/or Wo Contrast  Result Date: 02/16/2021 CLINICAL DATA:  55 year old male with history of shortness of breath, cough and congestion. COVID positive patient. Evaluate for pulmonary embolism. EXAM: CT ANGIOGRAPHY CHEST WITH  CONTRAST TECHNIQUE: Multidetector CT imaging of the chest was performed using the standard protocol during bolus administration of intravenous contrast. Multiplanar CT image reconstructions and MIPs were obtained to evaluate the vascular anatomy. CONTRAST:  49mL OMNIPAQUE IOHEXOL 350 MG/ML SOLN COMPARISON:  No priors. FINDINGS: Comment: Study is limited for detection of pulmonary embolism by suboptimal contrast bolus, and excessive image noise from patient's large body habitus. Cardiovascular: Within the limitations of today's examination, there are  no central, lobar or segmental sized filling defects to suggest clinically significant pulmonary embolism. Smaller subsegmental sized emboli cannot be entirely excluded secondary to the limitations of today's examination. Heart size is borderline enlarged. There is no significant pericardial fluid, thickening or pericardial calcification. There is aortic atherosclerosis, as well as atherosclerosis of the great vessels of the mediastinum and the coronary arteries, including calcified atherosclerotic plaque in the left main, left anterior descending, left circumflex and right coronary arteries. Calcifications of the aortic valve. Mediastinum/Nodes: No pathologically enlarged mediastinal or hilar lymph nodes. Esophagus is unremarkable in appearance. No axillary lymphadenopathy. Lungs/Pleura: Elevation of the right hemidiaphragm. Some associated passive subsegmental atelectasis and/or scarring in the base of the right lung. No acute consolidative airspace disease. No pleural effusions. No definite suspicious appearing pulmonary nodules or masses are noted. Mild diffuse bronchial wall thickening with mild centrilobular and paraseptal emphysema. Upper Abdomen: Diffuse low attenuation throughout the visualized hepatic parenchyma, indicative of a background of hepatic steatosis. Musculoskeletal: There are no aggressive appearing lytic or blastic lesions noted in the visualized  portions of the skeleton. Review of the MIP images confirms the above findings. IMPRESSION: 1. Despite the limitations of today's examination, there is no evidence to suggest clinically significant central, lobar or segmental sized pulmonary embolism. 2. Aortic atherosclerosis, in addition to left main and 3 vessel coronary artery disease. Please note that although the presence of coronary artery calcium documents the presence of coronary artery disease, the severity of this disease and any potential stenosis cannot be assessed on this non-gated CT examination. Assessment for potential risk factor modification, dietary therapy or pharmacologic therapy may be warranted, if clinically indicated. 3. Mild diffuse bronchial wall thickening with mild centrilobular and paraseptal emphysema; imaging findings suggestive of underlying COPD. 4. There are calcifications of the aortic valve. Echocardiographic correlation for evaluation of potential valvular dysfunction may be warranted if clinically indicated. 5. Hepatic steatosis. Aortic Atherosclerosis (ICD10-I70.0) and Emphysema (ICD10-J43.9). Electronically Signed   By: Trudie Reed M.D.   On: 02/16/2021 14:31   US Venous Img Lower Unilateral Left  Result Date: 02/16/2021 CLINICAL DATA:  Calf pain EXAM: LEFT LOWER EXTREMITY VENOUS DOPPLER ULTRASOUND TECHNIQUE: Gray-scale sonography with graded compression, as well as color Doppler and duplex ultrasound were performed to evaluate the lower extremity deep venous systems from the level of the common femoral vein and including the common femoral, femoral, profunda femoral, popliteal and calf veins including the posterior tibial, peroneal and gastrocnemius veins when visible. The superficial great saphenous vein was also interrogated. Spectral Doppler was utilized to evaluate flow at rest and with distal augmentation maneuvers in the common femoral, femoral and popliteal veins. COMPARISON:  None. FINDINGS: Contralateral  Common Femoral Vein: Respiratory phasicity is normal and symmetric with the symptomatic side. No evidence of thrombus. Normal compressibility. Common Femoral Vein: No evidence of thrombus. Normal compressibility, respiratory phasicity and response to augmentation. Saphenofemoral Junction: No evidence of thrombus. Normal compressibility and flow on color Doppler imaging. Profunda Femoral Vein: No evidence of thrombus. Normal compressibility and flow on color Doppler imaging. Femoral Vein: In the distal femoral vein, there is nonocclusive wall adherent thrombus. Popliteal Vein: In the femoral vein, there is nonocclusive wall adherent thrombus. Calf Veins: No evidence of thrombus. Normal compressibility and flow on color Doppler imaging. Other Findings:  None. 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 Electronically Signed   By: Olive Bass M.D.   On: 02/16/2021 12:43  EKG: I have personally reviewed.  Sinus rhythm, QTC 450, PVC, poor R wave progression, low voltage  Assessment/Plan Principal Problem:   Acute respiratory disease due to COVID-19 virus Active Problems:   Stroke [facility])   Type 2 diabetes mellitus without complication (HCC)   Essential hypertension   Thrombocytopenia (HCC)   DVT (deep venous thrombosis) (HCC)   Seizures (HCC)   Sepsis (HCC)   Acute respiratory disease due to COVID-19 virus and severe sepsis due to covid 19 infection: Chest x-ray showed bilateral patchy infiltration.  Patient has oxygen desaturating to lower 80s on exertion, currently on 2 L oxygen with 96% saturation.  CT angiograms limited study, but negative for PE.  Patient received 1 dose of Rocephin and azithromycin in ED, will not continue antibiotics since patient's symptoms are most likely due to COVID infection.  Patient meets criteria for severe sepsis with tachycardia with heart rate of 103 and RR 29.  Lactic acid is elevated  2.1 --> 2.3  -will admit to med-surg bed as inpt -Remdesivir per pharm -Solumedrol 60 mg bid -Bronchodilators -PRN Mucinex for cough -f/u Blood culture -f/u inflammatory markers -Will ask the patient to maintain an awake prone position for 16+ hours a day, if possible, with a minimum of 2-3 hours at a time -IF patient deteriorates, will consult PCCM and ID -will get Procalcitonin and trend lactic acid levels per sepsis protocol. -IVF: Gentle IV fluid --> 1L of NS in ED. Will attempt to maintain euvolemia to a net negative fluid status  Stroke [facility]): -on ASA, Plavix and Lipitor  Type 2 diabetes mellitus without complication [facility]): Patient taking metformin, NovoLog and Toujeo insulin. He is not sure about the does of Toujeo insulin. Chart review showed that pt was on 65 units daily before -will give glargin 45 unit daily -Sliding scale insulin  Essential hypertension -IV hydralazine prn -Amlodipine  Thrombocytopenia (HCC): This is chronic issue.  Platelet 120 -Follow-up with CBC  DVT (deep venous thrombosis) in left leg -Start IV heparin  Seizure -Seizure precaution -When necessary Ativan for seizure -Continue Home medications: Topamax           DVT ppx: on IV Heparin     Code Status: DNR (I discussed with patient in the presence of her daughter, and explained the meaning of CODE STATUS. Patient wants to be DNR) Family Communication: Yes, patient's daughter    at bed side Disposition Plan:  Anticipate discharge back to previous environment Consults called:  none Admission status and Level of care: Med-Surg:    Med-surg bed  as inpt       Status is: Inpatient  Remains inpatient appropriate because:Inpatient level of care appropriate due to severity of illness  Dispo: The patient is from: Home              Anticipated d/c is to: Home              Patient currently is not medically stable to d/c.   Difficult to place patient No          Date of Service  02/16/2021    Lorretta Harp [facility]   If 7PM-7AM, please contact night-coverage [email or web address] 02/16/2021, 5:42 PM

## 2021-02-16 NOTE — ED Provider Notes (Signed)
[facility] Emergency Department Provider Note ____________________________________________   Event Date/Time   First MD Initiated Contact with Patient 02/16/21 1109     (approximate)  I have reviewed the triage vital signs and the nursing notes.   HISTORY  Chief Complaint Shortness of Breath  HPI Zachary Deleon is a 55 y.o. male with history of diabetes, DVT, hypertension, seizures, CVA with residual left-sided weakness presents to the emergency department for treatment and evaluation of cough, shortness of breath, congestion started last night.  Multiple family members are COVID-positive at this time.  No known fever, nausea, vomiting, or diarrhea.  No alleviating measures attempted prior to arrival.         Past Medical History:  Diagnosis Date   Arthritis    Carotid stenosis    COPD (chronic obstructive pulmonary disease) (HCC)    Depression    Diabetes mellitus without complication (HCC)    DVT (deep venous thrombosis) (HCC)    Hypertension    OSA (obstructive sleep apnea)    Restrictive lung disease    Seizures (HCC)    Status post carotid endarterectomy    with patch angioplasty   Stroke (HCC) 11/2017    Patient Active Problem List   Diagnosis Date Noted   Pneumonia due to COVID-19 virus 02/16/2021   Thrombocytopenia (HCC)    Hypomagnesemia    Hyponatremia    Diabetes mellitus type 2 in obese (HCC)    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 Authorizing Provider  acetaminophen (TYLENOL) 325 MG tablet Take 2 tablets (650 mg  total) by mouth every 4 (four) hours as needed for mild pain (or temp > 37.5 C (99.5 F)). 12/20/17   Angiulli, Mcarthur Rossetti, PA-C  albuterol (VENTOLIN HFA) 108 (90 Base) MCG/ACT inhaler Inhale 2 puffs into the lungs every 4 (four) hours as needed for wheezing or shortness of breath. 12/20/17   Angiulli, Mcarthur Rossetti, PA-C  amLODipine (NORVASC) 10 MG tablet Take 1 tablet (10 mg total) by mouth daily. 12/21/17   Angiulli, Mcarthur Rossetti, PA-C  aspirin 325 MG tablet Take 1 tablet (325 mg total) by mouth daily. 12/07/17   Johnson, Clanford L, MD  atorvastatin (LIPITOR) 80 MG tablet Take 1 tablet (80 mg total) by mouth daily. 12/20/17   Angiulli, Mcarthur Rossetti, PA-C  clopidogrel (PLAVIX) 75 MG tablet Take 1 tablet (75 mg total) by mouth daily with breakfast. 12/20/17   Angiulli, Mcarthur Rossetti, PA-C  diclofenac sodium (VOLTAREN) 1 % GEL Apply 2 g topically 4 (four) times daily. 12/20/17   Angiulli, Mcarthur Rossetti, PA-C  fenofibrate 160 MG tablet Take 1 tablet (160 mg total) by mouth daily. 12/21/17   Angiulli, Mcarthur Rossetti, PA-C  FLUoxetine (PROZAC) 20 MG capsule Take 1 capsule (20 mg total) by mouth daily. 12/20/17   Angiulli, Mcarthur Rossetti, PA-C  Fluticasone-Salmeterol (ADVAIR) 250-50 MCG/DOSE AEPB Inhale 1 puff into the lungs 2 (two) times daily. 12/20/17   Angiulli, Mcarthur Rossetti, PA-C  gabapentin (NEURONTIN) 100 MG capsule Take 1 capsule (  100 mg total) by mouth 3 (three) times daily. 12/20/17   Angiulli, Mcarthur Rossetti, PA-C  lidocaine (LIDODERM) 5 % Place 1 patch onto the skin daily. Remove & Discard patch within 12 hours or as directed by MD 12/21/17   Angiulli, Mcarthur Rossetti, PA-C  methocarbamol (ROBAXIN) 500 MG tablet Take 1 tablet (500 mg total) by mouth every 8 (eight) hours as needed for muscle spasms. 12/20/17   Angiulli, Mcarthur Rossetti, PA-C  nicotine (NICODERM CQ - DOSED IN MG/24 HOURS) 14 mg/24hr patch 14 mg patch daily x3 weeks then 7 mg patch daily x3 weeks and stop 12/20/17   Angiulli, Mcarthur Rossetti, PA-C  QUEtiapine (SEROQUEL) 25 MG tablet Take 0.5 tablets (12.5 mg  total) by mouth 2 (two) times daily as needed (agitation). 12/20/17   Angiulli, Mcarthur Rossetti, PA-C  senna-docusate (SENOKOT-S) 8.6-50 MG tablet Take 1 tablet by mouth at bedtime. 12/06/17   Johnson, Clanford L, MD  tiotropium (SPIRIVA) 18 MCG inhalation capsule Place 18 mcg into inhaler and inhale daily.    [provider]  topiramate (TOPAMAX) 25 MG tablet Take 1 tablet (25 mg total) by mouth 2 (two) times daily. 12/20/17   Angiulli, Mcarthur Rossetti, PA-C  traMADol (ULTRAM) 50 MG tablet Take 0.5-1 tablets (25-50 mg total) by mouth every 6 (six) hours as needed for moderate pain or severe pain. Patient not taking: Reported on 03/15/2018 12/20/17   Angiulli, Mcarthur Rossetti, PA-C    Allergies Morphine and related  No family history on file.  Social History Social History   Tobacco Use   Smoking status: Former   Smokeless tobacco: Never  Substance Use Topics   Alcohol use: Not Currently   Drug use: Not Currently    Review of Systems  Constitutional: No fever/chills Eyes: No visual changes. ENT: No sore throat. Cardiovascular: Positive for chest pain. Respiratory: Positive for shortness of breath. Gastrointestinal: No abdominal pain.  No nausea, no vomiting.  No diarrhea.  No constipation. Genitourinary: Negative for dysuria. Musculoskeletal: Negative for back pain. Skin: Negative for rash. Neurological: Negative for headaches, focal weakness or numbness  ____________________________________________   PHYSICAL EXAM:  VITAL SIGNS: ED Triage Vitals  Enc Vitals Group     BP 02/16/21 1055 (!) 157/90     Pulse Rate 02/16/21 1055 (!) 102     Resp 02/16/21 1055 (!) 29     Temp 02/16/21 1055 99.7 F (37.6 C)     Temp src --      SpO2 02/16/21 1052 96 %     Weight 02/16/21 1055 (!) 325 lb (147.4 kg)     Height 02/16/21 1055 6\' 2"  (1.88 m)     Head Circumference --      Peak Flow --      Pain Score 02/16/21 1052 5     Pain Loc --      Pain Edu? --      Excl. in GC? --      Constitutional: Alert and oriented.  Chronically ill appearing and in no acute distress. Eyes: Conjunctivae are normal. Head: Atraumatic. Nose: No congestion/rhinnorhea. Mouth/Throat: Mucous membranes are moist.  Oropharynx non-erythematous. Neck: No stridor.   Hematological/Lymphatic/Immunilogical: No cervical lymphadenopathy. Cardiovascular: Normal rate, regular rhythm. Grossly normal heart sounds.  Good peripheral circulation. Respiratory: Normal respiratory effort.  No retractions. Lungs diminished with scattered wheezing.   Gastrointestinal: Soft and nontender. No distention. No abdominal bruits. Genitourinary:  Musculoskeletal: No lower extremity tenderness nor edema.  No joint effusions. Neurologic:  Normal speech and  language. No gross focal neurologic deficits are appreciated. No gait instability. Skin:  Skin is warm, dry and intact. No rash noted. Psychiatric: Mood and affect are normal. Speech and behavior are normal.  ____________________________________________   LABS (all labs ordered are listed, but only abnormal results are displayed)  Labs Reviewed  RESP PANEL BY RT-PCR (FLU A&B, COVID) ARPGX2 - Abnormal; Notable for the following components:      Result Value   SARS Coronavirus 2 by RT PCR POSITIVE (*)    All other components within normal limits  CBC - Abnormal; Notable for the following components:   HCT 38.5 (*)    Platelets 120 (*)    All other components within normal limits  BASIC METABOLIC PANEL - Abnormal; Notable for the following components:   Sodium 132 (*)    Chloride 95 (*)    Glucose, Bld 263 (*)    All other components within normal limits  LACTIC ACID, PLASMA - Abnormal; Notable for the following components:   Lactic Acid, Venous 2.1 (*)    All other components within normal limits  LACTIC ACID, PLASMA - Abnormal; Notable for the following components:   Lactic Acid, Venous 2.3 (*)    All other components within normal limits  CBG  MONITORING, ED - Abnormal; Notable for the following components:   Glucose-Capillary 300 (*)    All other components within normal limits  TROPONIN I (HIGH SENSITIVITY)  TROPONIN I (HIGH SENSITIVITY)   ____________________________________________  EKG  ED ECG REPORT I, Syanne Looney, FNP-BC personally viewed and interpreted this ECG.   Date: 02/16/2021  EKG Time: 1054  Rate: 108  Rhythm: sinus tachycardia with occasional PVC  Axis: normal  Intervals:none  ST&T Change: no ST elevation  ____________________________________________  RADIOLOGY  ED MD interpretation:    Left lower lobe pneumonia.  CTA chest negative for PE.  I, Cortlyn Cannell, personally viewed and evaluated these images (plain radiographs) as part of my medical decision making, as well as reviewing the written report by the radiologist.  Official radiology report(s): DG Chest 2 View  Result Date: 02/16/2021 CLINICAL DATA:  Cough, congestion shortness of breath. EXAM: CHEST - 2 VIEW COMPARISON:  03/29/2018 FINDINGS: The cardiac silhouette, mediastinal and hilar contours are within limits and stable. Patchy interstitial infiltrates could suggest COVID pneumonia. No focal airspace consolidation or pulmonary lesions. IMPRESSION: Patchy interstitial infiltrates could suggest COVID pneumonia. Electronically Signed   By: Rudie Meyer M.D.   On: 02/16/2021 11:53   CT Angio Chest PE W and/or Wo Contrast  Result Date: 02/16/2021 CLINICAL DATA:  55 year old male with history of shortness of breath, cough and congestion. COVID positive patient. Evaluate for pulmonary embolism. EXAM: CT ANGIOGRAPHY CHEST WITH CONTRAST TECHNIQUE: Multidetector CT imaging of the chest was performed using the standard protocol during bolus administration of intravenous contrast. Multiplanar CT image reconstructions and MIPs were obtained to evaluate the vascular anatomy. CONTRAST:  38mL OMNIPAQUE IOHEXOL 350 MG/ML SOLN COMPARISON:  No priors.  FINDINGS: Comment: Study is limited for detection of pulmonary embolism by suboptimal contrast bolus, and excessive image noise from patient's large body habitus. Cardiovascular: Within the limitations of today's examination, there are no central, lobar or segmental sized filling defects to suggest clinically significant pulmonary embolism. Smaller subsegmental sized emboli cannot be entirely excluded secondary to the limitations of today's examination. Heart size is borderline enlarged. There is no significant pericardial fluid, thickening or pericardial calcification. There is aortic atherosclerosis, as well as atherosclerosis of the great vessels  of the mediastinum and the coronary arteries, including calcified atherosclerotic plaque in the left main, left anterior descending, left circumflex and right coronary arteries. Calcifications of the aortic valve. Mediastinum/Nodes: No pathologically enlarged mediastinal or hilar lymph nodes. Esophagus is unremarkable in appearance. No axillary lymphadenopathy. Lungs/Pleura: Elevation of the right hemidiaphragm. Some associated passive subsegmental atelectasis and/or scarring in the base of the right lung. No acute consolidative airspace disease. No pleural effusions. No definite suspicious appearing pulmonary nodules or masses are noted. Mild diffuse bronchial wall thickening with mild centrilobular and paraseptal emphysema. Upper Abdomen: Diffuse low attenuation throughout the visualized hepatic parenchyma, indicative of a background of hepatic steatosis. Musculoskeletal: There are no aggressive appearing lytic or blastic lesions noted in the visualized portions of the skeleton. Review of the MIP images confirms the above findings. IMPRESSION: 1. Despite the limitations of today's examination, there is no evidence to suggest clinically significant central, lobar or segmental sized pulmonary embolism. 2. Aortic atherosclerosis, in addition to left main and 3 vessel  coronary artery disease. Please note that although the presence of coronary artery calcium documents the presence of coronary artery disease, the severity of this disease and any potential stenosis cannot be assessed on this non-gated CT examination. Assessment for potential risk factor modification, dietary therapy or pharmacologic therapy may be warranted, if clinically indicated. 3. Mild diffuse bronchial wall thickening with mild centrilobular and paraseptal emphysema; imaging findings suggestive of underlying COPD. 4. There are calcifications of the aortic valve. Echocardiographic correlation for evaluation of potential valvular dysfunction may be warranted if clinically indicated. 5. Hepatic steatosis. Aortic Atherosclerosis (ICD10-I70.0) and Emphysema (ICD10-J43.9). Electronically Signed   By: Trudie Reed M.D.   On: 02/16/2021 14:31   US Venous Img Lower Unilateral Left  Result Date: 02/16/2021 CLINICAL DATA:  Calf pain EXAM: LEFT LOWER EXTREMITY VENOUS DOPPLER ULTRASOUND TECHNIQUE: Gray-scale sonography with graded compression, as well as color Doppler and duplex ultrasound were performed to evaluate the lower extremity deep venous systems from the level of the common femoral vein and including the common femoral, femoral, profunda femoral, popliteal and calf veins including the posterior tibial, peroneal and gastrocnemius veins when visible. The superficial great saphenous vein was also interrogated. Spectral Doppler was utilized to evaluate flow at rest and with distal augmentation maneuvers in the common femoral, femoral and popliteal veins. COMPARISON:  None. FINDINGS: Contralateral Common Femoral Vein: Respiratory phasicity is normal and symmetric with the symptomatic side. No evidence of thrombus. Normal compressibility. Common Femoral Vein: No evidence of thrombus. Normal compressibility, respiratory phasicity and response to augmentation. Saphenofemoral Junction: No evidence of thrombus.  Normal compressibility and flow on color Doppler imaging. Profunda Femoral Vein: No evidence of thrombus. Normal compressibility and flow on color Doppler imaging. Femoral Vein: In the distal femoral vein, there is nonocclusive wall adherent thrombus. Popliteal Vein: In the femoral vein, there is nonocclusive wall adherent thrombus. Calf Veins: No evidence of thrombus. Normal compressibility and flow on color Doppler imaging. Other Findings:  None. 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 Electronically Signed   By: Olive Bass M.D.   On: 02/16/2021 12:43    ____________________________________________   PROCEDURES  Procedure(s) performed (including Critical Care):  Procedures  ____________________________________________   INITIAL IMPRESSION / ASSESSMENT AND PLAN     55 year old male presenting to the emergency department for treatment and evaluation of shortness of breath, cough, congestion.  See HPI for further details.  He is also complaining of some  left calf pain.  History of DVT.  In addition to protocol labs and imaging, venous ultrasound ordered as well.  DIFFERENTIAL DIAGNOSIS  COVID, viral syndrome, acute on chronic lung disease, DVT, PE  ED COURSE  ----------------------------------------- 12:13 PM on 02/16/2021 ----------------------------------------- Chest x-ray suggestive of COVID-pneumonia.  Rocephin and azithromycin ordered.  Lactic acid ordered although he does not have an elevated white count, current heart rate is 98, and temperature is 99.7 but he is somewhat tachypneic at 25, so he is borderline sepsis criteria.  ----------------------------------------- 1:32 PM on 02/16/2021 ----------------------------------------- Acute onset pain in left arm and chest. Repeat ECG ordered as well as additional labs. CTA chest ordered.  Left lower extremity ultrasound showing a chronic  appearing DVT.  He is on Plavix.   ----------------------------------------- 1:54 PM on 02/16/2021 ----------------------------------------- Lactic was ran on labs sent when patient arrived this am. Question accuracy, will repeat.  ----------------------------------------- 3:37 PM on 02/16/2021 ----------------------------------------- Repeat lactic is 2.3.  Patient is already received antibiotics.  Fluids ordered. Oxygen saturation 89% on room air at rest.  With any attempt to ambulate, decreases into the mid to low 80s.  2 L of oxygen applied.  Plan will be to get him admitted via hospitalist service.   ----------------------------------------- 4:02 PM on 02/16/2021 ----------------------------------------- Patient accepted for admission by Dr. Zane HeraldNu   ___________________________________________   FINAL CLINICAL IMPRESSION(S) / ED DIAGNOSES  Final diagnoses:  Pneumonia due to COVID-19 virus  Hypoxia     ED Discharge Orders     None        Zachary Deleon was evaluated in Emergency Department on 02/16/2021 for the symptoms described in the history of present illness. He was evaluated in the context of the global COVID-19 pandemic, which necessitated consideration that the patient might be at risk for infection with the SARS-CoV-2 virus that causes COVID-19. Institutional protocols and algorithms that pertain to the evaluation of patients at risk for COVID-19 are in a state of rapid change based on information released by regulatory bodies including the CDC and federal and state organizations. These policies and algorithms were followed during the patient's care in the ED.   Note:  This document was prepared using Dragon voice recognition software and may include unintentional dictation errors.    Chinita Pesterriplett, Rachana Malesky B, FNP 02/16/21 1602    Sharman CheekStafford, Phillip, MD 02/17/21 [phone]

## 2021-02-16 NOTE — Progress Notes (Signed)
Remdesivir - Pharmacy Brief Note   O:  ALT: N/A CXR: "Patchy interstitial infiltrates could suggest COVID pneumonia" SpO2: hypoxic requiring supplemental O2   A/P:  02/16/2021 SARSCOV2 PCR positive Remdesivir 200 mg IVPB once followed by 100 mg IVPB daily x 4 days.   Jaynie Bream, PharmD Pharmacy Resident  02/16/2021 4:34 PM

## 2021-02-16 NOTE — Progress Notes (Signed)
ANTICOAGULATION CONSULT NOTE  Pharmacy Consult for IV heparin Indication: left leg DVT  Allergies  Allergen Reactions   Morphine And Related Other (See Comments)    Makes pt hyper    Patient Measurements: Height: 6\' 2"  (188 cm) Weight: (!) 147.4 kg (325 lb) IBW/kg (Calculated) : 82.2 Heparin Dosing Weight: 116.2 kg  Vital Signs: Temp: 99.7 F (37.6 C) (09/07 1055) BP: 132/77 (09/07 1430) Pulse Rate: 99 (09/07 1430)  Labs: Recent Labs    02/16/21 1055 02/16/21 1330 02/16/21 1530  HGB 13.5  --   --   HCT 38.5*  --   --   PLT 120*  --   --   CREATININE 1.06  --   --   TROPONINIHS  --  15 12    Estimated Creatinine Clearance: 119.2 mL/min (by C-G formula based on SCr of 1.06 mg/dL).   Medical History: Past Medical History:  Diagnosis Date   Arthritis    Carotid stenosis    COPD (chronic obstructive pulmonary disease) (HCC)    Depression    Diabetes mellitus without complication (HCC)    DVT (deep venous thrombosis) (HCC)    Hypertension    OSA (obstructive sleep apnea)    Restrictive lung disease    Seizures (HCC)    Status post carotid endarterectomy    with patch angioplasty   Stroke (HCC) 11/2017    Medications:  (Not in a hospital admission)  Scheduled:   insulin aspart  0-5 Units Subcutaneous QHS   insulin aspart  0-9 Units Subcutaneous TID WC   ipratropium  2 puff Inhalation Q4H   methylPREDNISolone (SOLU-MEDROL) injection  60 mg Intramuscular Q12H   Infusions:   remdesivir 200 mg in sodium chloride 0.9% 250 mL IVPB     Followed by   12/2017 ON 02/17/2021] remdesivir 100 mg in NS 100 mL     PRN: acetaminophen, albuterol, dextromethorphan-guaiFENesin, hydrALAZINE, LORazepam, ondansetron (ZOFRAN) IV Anti-infectives (From admission, onward)    Start     Dose/Rate Route Frequency Ordered Stop   02/17/21 1000  remdesivir 100 mg in sodium chloride 0.9 % 100 mL IVPB       See Hyperspace for full Linked Orders Report.   100 mg 200 mL/hr over 30  Minutes Intravenous Daily 02/16/21 1632 02/21/21 0959   02/16/21 1800  remdesivir 200 mg in sodium chloride 0.9% 250 mL IVPB       See Hyperspace for full Linked Orders Report.   200 mg 580 mL/hr over 30 Minutes Intravenous Once 02/16/21 1632     02/16/21 1215  cefTRIAXone (ROCEPHIN) 1 g in sodium chloride 0.9 % 100 mL IVPB        1 g 200 mL/hr over 30 Minutes Intravenous  Once 02/16/21 1213 02/16/21 1446   02/16/21 1215  azithromycin (ZITHROMAX) 500 mg in sodium chloride 0.9 % 250 mL IVPB        500 mg 250 mL/hr over 60 Minutes Intravenous  Once 02/16/21 1213 02/16/21 1549       Assessment: 55YOM presenting with SOB, cough and congestion. 9/7 SARSCOV2 PCR positive, on remdesivir. 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 Per chart review, patient is not on anticoagulation at home. Baseline CBC stable.   Goal of Therapy:  Heparin level 0.3-0.7 units/ml Monitor platelets by anticoagulation protocol: Yes   Plan:  Give 6,000 units bolus x 1 Start heparin infusion at 1900 units/hr Check anti-Xa level in 6 hours and  daily while on heparin Baseline aPTT, INR. Daily CBC.  Jaynie Bream, PharmD Pharmacy Resident  02/16/2021 4:46 PM

## 2021-02-16 NOTE — ED Triage Notes (Signed)
Pt comes via [facility] with c/o SOb, cough and congestion that started last night. Pt states others in household are COVID+.  EMs reports 95-97 %RA, pt little hypertensive, CBG-281, T-99.4.  Pt has hx of COPD.

## 2021-02-17 ENCOUNTER — Encounter: Payer: Self-pay | Admitting: Internal Medicine

## 2021-02-17 DIAGNOSIS — J1282 Pneumonia due to coronavirus disease 2019: Secondary | ICD-10-CM

## 2021-02-17 DIAGNOSIS — I824Y2 Acute embolism and thrombosis of unspecified deep veins of left proximal lower extremity: Secondary | ICD-10-CM

## 2021-02-17 DIAGNOSIS — J9601 Acute respiratory failure with hypoxia: Secondary | ICD-10-CM

## 2021-02-17 LAB — GLUCOSE, CAPILLARY
Glucose-Capillary: 288 mg/dL — ABNORMAL HIGH (ref 70–99)
Glucose-Capillary: 370 mg/dL — ABNORMAL HIGH (ref 70–99)
Glucose-Capillary: 443 mg/dL — ABNORMAL HIGH (ref 70–99)
Glucose-Capillary: 397 mg/dL — ABNORMAL HIGH (ref 70–99)

## 2021-02-17 LAB — CBC WITH DIFFERENTIAL/PLATELET
Abs Immature Granulocytes: 0.05 10*3/uL (ref 0.00–0.07)
Basophils Absolute: 0 10*3/uL (ref 0.0–0.1)
Basophils Relative: 0 %
Eosinophils Absolute: 0 10*3/uL (ref 0.0–0.5)
Eosinophils Relative: 0 %
HCT: 35.4 % — ABNORMAL LOW (ref 39.0–52.0)
Hemoglobin: 12.3 g/dL — ABNORMAL LOW (ref 13.0–17.0)
Immature Granulocytes: 1 %
Lymphocytes Relative: 14 %
Lymphs Abs: 0.7 10*3/uL (ref 0.7–4.0)
MCH: 29 pg (ref 26.0–34.0)
MCHC: 34.7 g/dL (ref 30.0–36.0)
MCV: 83.5 fL (ref 80.0–100.0)
Monocytes Absolute: 0.3 10*3/uL (ref 0.1–1.0)
Monocytes Relative: 6 %
Neutro Abs: 3.8 10*3/uL (ref 1.7–7.7)
Neutrophils Relative %: 79 %
Platelets: 108 10*3/uL — ABNORMAL LOW (ref 150–400)
RBC: 4.24 MIL/uL (ref 4.22–5.81)
RDW: 14.2 % (ref 11.5–15.5)
WBC: 4.9 10*3/uL (ref 4.0–10.5)
nRBC: 0 % (ref 0.0–0.2)

## 2021-02-17 LAB — BASIC METABOLIC PANEL
Anion gap: 16 — ABNORMAL HIGH (ref 5–15)
BUN: 21 mg/dL — ABNORMAL HIGH (ref 6–20)
CO2: 22 mmol/L (ref 22–32)
Calcium: 9 mg/dL (ref 8.9–10.3)
Chloride: 95 mmol/L — ABNORMAL LOW (ref 98–111)
Creatinine, Ser: 1.01 mg/dL (ref 0.61–1.24)
GFR, Estimated: >60 mL/min (ref >60)
Glucose, Bld: 295 mg/dL — ABNORMAL HIGH (ref 70–99)
Potassium: 4.2 mmol/L (ref 3.5–5.1)
Sodium: 133 mmol/L — ABNORMAL LOW (ref 135–145)

## 2021-02-17 LAB — HIV ANTIBODY (ROUTINE TESTING W REFLEX): HIV Screen 4th Generation wRfx: NONREACTIVE

## 2021-02-17 LAB — MRSA NEXT GEN BY PCR, NASAL: MRSA by PCR Next Gen: NOT DETECTED

## 2021-02-17 LAB — PROTIME-INR
INR: 1.1 (ref 0.8–1.2)
Prothrombin Time: 14.3 seconds (ref 11.4–15.2)

## 2021-02-17 LAB — HEPARIN LEVEL (UNFRACTIONATED)
Heparin Unfractionated: 0.28 IU/mL — ABNORMAL LOW (ref 0.30–0.70)
Heparin Unfractionated: 0.32 IU/mL (ref 0.30–0.70)
Heparin Unfractionated: 0.4 IU/mL (ref 0.30–0.70)

## 2021-02-17 LAB — D-DIMER, QUANTITATIVE: D-Dimer, Quant: 0.43 ug/mL-FEU (ref 0.00–0.50)

## 2021-02-17 LAB — C-REACTIVE PROTEIN
CRP: 1.6 mg/dL — ABNORMAL HIGH (ref <1.0)
CRP: 1.7 mg/dL — ABNORMAL HIGH (ref <1.0)

## 2021-02-17 LAB — HEMOGLOBIN A1C
Hgb A1c MFr Bld: 10.5 % — ABNORMAL HIGH (ref 4.8–5.6)
Mean Plasma Glucose: 254.65 mg/dL

## 2021-02-17 LAB — FERRITIN: Ferritin: 214 ng/mL (ref 24–336)

## 2021-02-17 LAB — LACTIC ACID, PLASMA
Lactic Acid, Venous: 2.4 mmol/L (ref 0.5–1.9)
Lactic Acid, Venous: 4 mmol/L (ref 0.5–1.9)

## 2021-02-17 MED ORDER — INSULIN ASPART 100 UNIT/ML IJ SOLN
0.0000 [IU] | Freq: Every day | INTRAMUSCULAR | Status: DC
  Administered 2021-02-17: 5 [IU] via SUBCUTANEOUS
  Filled 2021-02-17: qty 1

## 2021-02-17 MED ORDER — INSULIN ASPART 100 UNIT/ML IJ SOLN
0.0000 [IU] | Freq: Three times a day (TID) | INTRAMUSCULAR | Status: DC
  Administered 2021-02-17 (×2): 15 [IU] via SUBCUTANEOUS
  Administered 2021-02-18: 11 [IU] via SUBCUTANEOUS
  Filled 2021-02-17 (×3): qty 1

## 2021-02-17 MED ORDER — HEPARIN BOLUS VIA INFUSION
1700.0000 [IU] | INTRAVENOUS | Status: AC
  Administered 2021-02-17: 1700 [IU] via INTRAVENOUS
  Filled 2021-02-17: qty 1700

## 2021-02-17 MED ORDER — FENTANYL CITRATE PF 50 MCG/ML IJ SOSY
25.0000 ug | PREFILLED_SYRINGE | INTRAMUSCULAR | Status: DC | PRN
Start: 2021-02-17 — End: 2021-02-20
  Administered 2021-02-17: 25 ug via INTRAVENOUS
  Filled 2021-02-17: qty 1

## 2021-02-17 MED ORDER — INFLUENZA VAC SPLIT QUAD 0.5 ML IM SUSY
0.5000 mL | PREFILLED_SYRINGE | INTRAMUSCULAR | Status: DC
  Filled 2021-02-17: qty 0.5

## 2021-02-17 MED ORDER — METHYLPREDNISOLONE SODIUM SUCC 125 MG IJ SOLR
60.0000 mg | Freq: Two times a day (BID) | INTRAMUSCULAR | Status: DC
  Administered 2021-02-17 – 2021-02-19 (×6): 60 mg via INTRAVENOUS
  Filled 2021-02-17 (×5): qty 2

## 2021-02-17 MED ORDER — INSULIN ASPART 100 UNIT/ML IJ SOLN
5.0000 [IU] | Freq: Three times a day (TID) | INTRAMUSCULAR | Status: DC
  Administered 2021-02-17 – 2021-02-18 (×3): 5 [IU] via SUBCUTANEOUS
  Filled 2021-02-17 (×3): qty 1

## 2021-02-17 MED ORDER — INSULIN GLARGINE-YFGN 100 UNIT/ML ~~LOC~~ SOLN
55.0000 [IU] | Freq: Every day | SUBCUTANEOUS | Status: DC
  Administered 2021-02-17: 55 [IU] via SUBCUTANEOUS
  Filled 2021-02-17 (×2): qty 0.55

## 2021-02-17 NOTE — Progress Notes (Signed)
Pt, daughter stayed in the room with him when admit to floor around 22:30. ED nurse and ED CN noted that daughter would be a great help to the Pt with ambulation and ADLs as Pt had a previous CVA with Left sided weakness and  has had several falls at home due to weakness and balance off. ADON was consulted and she agreed if Pt family would be of assistance to the Pt that she could stay.Pt family at home also has the COVID. Daughter informed that she has to stay in the room and would be the sole visitor.

## 2021-02-17 NOTE — Progress Notes (Signed)
ANTICOAGULATION CONSULT NOTE  Pharmacy Consult for IV heparin Indication: left leg DVT  Patient Measurements: Height: 6\' 2"  (188 cm) Weight: (!) 147.4 kg (325 lb) IBW/kg (Calculated) : 82.2 Heparin Dosing Weight: 116.2 kg  Vital Signs: Temp: 97.9 F (36.6 C) (09/08 0655) Temp Source: Oral (09/08 0655) BP: 143/76 (09/08 0655) Pulse Rate: 89 (09/08 0655)  Labs: Recent Labs    02/16/21 1055 02/16/21 1330 02/16/21 1530 02/16/21 1752 02/16/21 2333 02/17/21 0232  HGB 13.5  --   --   --   --  12.3*  HCT 38.5*  --   --   --   --  35.4*  PLT 120*  --   --   --   --  108*  APTT  --   --   --  30  --   --   LABPROT  --   --   --  14.2 14.3  --   INR  --   --   --  1.1 1.1  --   HEPARINUNFRC  --   --   --   --  0.28*  --   CREATININE 1.06  --   --   --   --   --   TROPONINIHS  --  15 12  --   --   --      Estimated Creatinine Clearance: 119.2 mL/min (by C-G formula based on SCr of 1.06 mg/dL).   Medical History: Past Medical History:  Diagnosis Date   Arthritis    Carotid stenosis    COPD (chronic obstructive pulmonary disease) (HCC)    Depression    Diabetes mellitus without complication (HCC)    DVT (deep venous thrombosis) (HCC)    Hypertension    OSA (obstructive sleep apnea)    Restrictive lung disease    Seizures (HCC)    Status post carotid endarterectomy    with patch angioplasty   Stroke (HCC) 11/2017    Medications:  Medications Prior to Admission  Medication Sig Dispense Refill Last Dose   acetaminophen (TYLENOL) 325 MG tablet Take 2 tablets (650 mg total) by mouth every 4 (four) hours as needed for mild pain (or temp > 37.5 C (99.5 F)).   Past Week   albuterol (VENTOLIN HFA) 108 (90 Base) MCG/ACT inhaler Inhale 2 puffs into the lungs every 4 (four) hours as needed for wheezing or shortness of breath. 1 Inhaler 0 02/16/2021   amLODipine (NORVASC) 10 MG tablet Take 1 tablet (10 mg total) by mouth daily. 30 tablet 1 02/15/2021   aspirin 325 MG tablet Take 1  tablet (325 mg total) by mouth daily.   02/15/2021   aspirin 81 MG EC tablet Take 1 tablet by mouth daily.   02/15/2021   atorvastatin (LIPITOR) 80 MG tablet Take 1 tablet (80 mg total) by mouth daily. 30 tablet 1 02/15/2021   clopidogrel (PLAVIX) 75 MG tablet Take 1 tablet (75 mg total) by mouth daily with breakfast. 30 tablet 1 02/15/2021   cyclobenzaprine (FLEXERIL) 10 MG tablet Take 10 mg by mouth 2 (two) times daily as needed.   02/15/2021   diclofenac sodium (VOLTAREN) 1 % GEL Apply 2 g topically 4 (four) times daily. 1 Tube 1 Past Week   fluconazole (DIFLUCAN) 150 MG tablet    Past Month   FLUoxetine (PROZAC) 20 MG capsule Take 1 capsule (20 mg total) by mouth daily. 30 capsule 3 02/15/2021   FLUoxetine (PROZAC) 40 MG capsule Take 40 mg by mouth  daily.   02/15/2021   Fluticasone-Salmeterol (ADVAIR) 250-50 MCG/DOSE AEPB Inhale 1 puff into the lungs 2 (two) times daily. 1 each 1 02/16/2021   insulin aspart (NOVOLOG) 100 UNIT/ML FlexPen INJECT 20 UNITS UNDER THE SKIN THREE TIMES DAILY BEFORE MEALS PLUS SLIDING SCALE UP TO 80 UNITS EVERY DAY   02/15/2021   ketoconazole (NIZORAL) 2 % shampoo LATHER ON WET BEARD. LEAVE ON FOR 3-5 MINUTES AND RINSE. APPLY TWICE WEEKLY FOR 2-4 WEEKS   Past Week   lidocaine (LIDODERM) 5 % Place 1 patch onto the skin daily. Remove & Discard patch within 12 hours or as directed by MD 30 patch 0 Past Month   loratadine (CLARITIN) 10 MG tablet Take 1 tablet by mouth daily.   02/15/2021   metFORMIN (GLUCOPHAGE-XR) 500 MG 24 hr tablet Take 1,000 mg by mouth 2 (two) times daily.   02/15/2021   methocarbamol (ROBAXIN) 500 MG tablet Take 1 tablet (500 mg total) by mouth every 8 (eight) hours as needed for muscle spasms. 60 tablet 0 Past Week   nystatin (MYCOSTATIN/NYSTOP) powder Apply topically.   Past Month   pregabalin (LYRICA) 75 MG capsule Take 75 mg by mouth 2 (two) times daily.   02/15/2021   QUEtiapine (SEROQUEL) 25 MG tablet Take 0.5 tablets (12.5 mg total) by mouth 2 (two) times daily as  needed (agitation). 30 tablet 0 Past Month   senna-docusate (SENOKOT-S) 8.6-50 MG tablet Take 1 tablet by mouth at bedtime.   Past Month   tamsulosin (FLOMAX) 0.4 MG CAPS capsule Take 1 capsule by mouth daily.   02/15/2021   tiotropium (SPIRIVA) 18 MCG inhalation capsule Place 18 mcg into inhaler and inhale daily.   02/16/2021   topiramate (TOPAMAX) 25 MG tablet Take 1 tablet (25 mg total) by mouth 2 (two) times daily. 60 tablet 1 Past Month   TOUJEO SOLOSTAR 300 UNIT/ML Solostar Pen Inject into the skin.   02/15/2021   traMADol (ULTRAM) 50 MG tablet Take 0.5-1 tablets (25-50 mg total) by mouth every 6 (six) hours as needed for moderate pain or severe pain. 30 tablet 0 Past Week   fenofibrate 160 MG tablet Take 1 tablet (160 mg total) by mouth daily. (Patient not taking: No sig reported) 30 tablet 1 Not Taking   gabapentin (NEURONTIN) 100 MG capsule Take 1 capsule (100 mg total) by mouth 3 (three) times daily. (Patient not taking: No sig reported) 90 capsule 1 Completed Course   losartan (COZAAR) 50 MG tablet Take 75 mg by mouth daily.      nicotine (NICODERM CQ - DOSED IN MG/24 HOURS) 14 mg/24hr patch 14 mg patch daily x3 weeks then 7 mg patch daily x3 weeks and stop 28 patch 0    Scheduled:   amLODipine  10 mg Oral Daily   aspirin EC  81 mg Oral Daily   atorvastatin  80 mg Oral Daily   clopidogrel  75 mg Oral Q breakfast   FLUoxetine  40 mg Oral Daily   insulin aspart  0-5 Units Subcutaneous QHS   insulin aspart  0-9 Units Subcutaneous TID WC   insulin glargine-yfgn  45 Units Subcutaneous QHS   ipratropium  2 puff Inhalation Q4H   loratadine  10 mg Oral Daily   methylPREDNISolone (SOLU-MEDROL) injection  60 mg Intravenous Q12H   mometasone-formoterol  2 puff Inhalation BID   pregabalin  75 mg Oral BID   senna-docusate  1 tablet Oral QHS   tamsulosin  0.4 mg Oral Daily  topiramate  25 mg Oral BID   Infusions:   heparin 2,100 Units/hr (02/17/21 0622)   remdesivir 100 mg in NS 100 mL      PRN: acetaminophen, albuterol, cyclobenzaprine, dextromethorphan-guaiFENesin, hydrALAZINE, LORazepam, ondansetron (ZOFRAN) IV, traMADol Anti-infectives (From admission, onward)    Start     Dose/Rate Route Frequency Ordered Stop   02/17/21 1000  remdesivir 100 mg in sodium chloride 0.9 % 100 mL IVPB       See Hyperspace for full Linked Orders Report.   100 mg 200 mL/hr over 30 Minutes Intravenous Daily 02/16/21 1632 02/21/21 0959   02/16/21 1800  remdesivir 200 mg in sodium chloride 0.9% 250 mL IVPB       See Hyperspace for full Linked Orders Report.   200 mg 580 mL/hr over 30 Minutes Intravenous Once 02/16/21 1632 02/16/21 1818   02/16/21 1215  cefTRIAXone (ROCEPHIN) 1 g in sodium chloride 0.9 % 100 mL IVPB        1 g 200 mL/hr over 30 Minutes Intravenous  Once 02/16/21 1213 02/16/21 1446   02/16/21 1215  azithromycin (ZITHROMAX) 500 mg in sodium chloride 0.9 % 250 mL IVPB        500 mg 250 mL/hr over 60 Minutes Intravenous  Once 02/16/21 1213 02/16/21 1549       Assessment: 55YOM presenting with SOB, cough and congestion. 9/7 SARSCOV2 PCR positive, on remdesivir. 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 Per chart review, patient is not on anticoagulation at home. There is noted thrombocytopenia at baseline: continue to follow.  Goal of Therapy:  Heparin level 0.3-0.7 units/ml Monitor platelets by anticoagulation protocol: Yes  Plan:  Heparin level therapeutic:continue heparin infusion at 2100 units/hr Recheck heparin level in 6 hours to confirm Daily CBC while on IV heparin  Burnis Medin, PharmD 02/17/2021 7:15 AM

## 2021-02-17 NOTE — Progress Notes (Signed)
PROGRESS NOTE    Zachary Deleon  ZSW:[id number] DOB: [birth year] DOA: 02/16/2021 PCP: Janyth Pupa, NP  Assessment & Plan:   Principal Problem:   Acute respiratory disease due to COVID-19 virus Active Problems:   Stroke [facility])   Type 2 diabetes mellitus without complication ([place])   Essential hypertension   Thrombocytopenia ([place])   DVT (deep venous thrombosis) (HCC)   Seizures (HCC)   Severe sepsis ([place])   Severe sepsis: met criteria w/ tachycardia, tachypnea, elevated lactic acid and COVID19 pneumonia. Continue on IV remdesivir, steroids and bronchodilators. Encourage incentive spirometry   COVID-19 pneumonia: continue on IV remdesivir, IV steroids & bronchodialtors. Encourage incentive spirometry   Acute hypoxic respiratory failure: likely secondary to COVID19 pneumonia. Continue on supplemental oxygen and wean as tolerated   CVA: continue on aspirin, plavix & statin   DM2: likely poorly controlled. Continue on glargine, SSI w/ accuchecks    HTN: continue on amlodipine. IV hydralazine prn    Thrombocytopenia: chronic. Will continue to monitor   DVT: of LLE. Continue on IV heparin. Previous hx of DVTs as per pt. Sedentary lifestyle. No recent long car or plane rides   Seizure: continue on home dose of topamax. Ativan prn. Seizure precautions   Hyponatremia: labile. Will continue to monitor   DVT prophylaxis: IV heparin  Code Status: DNR Family Communication: discussed pt's care w/ pt's family at bedside and answered their questions Disposition Plan: likely d/c back home   Level of care: Med-Surg  Status is: Inpatient  Remains inpatient appropriate because:IV treatments appropriate due to intensity of illness or inability to take PO and Inpatient level of care appropriate due to severity of illness  Dispo: The patient is from: Home              Anticipated d/c is to: Home              Patient currently is not medically stable to d/c.   Difficult to place  patient : unclear      Consultants:    Procedures:   Antimicrobials:   Subjective: Pt c/o malaise   Objective: Vitals:   02/16/21 1945 02/16/21 2000 02/16/21 2245 02/17/21 0655  BP: (!) 130/56  (!) 161/100 (!) 143/76  Pulse: (!) 48 97 (!) 105 89  Resp: (!) _0 Temp:    97.9 F (36.6 C)  TempSrc:    Oral  SpO2: 97%  99% 96%  Weight:      Height:        Intake/Output Summary (Last 24 hours) at 02/17/2021 0807 Last data filed at 02/17/2021 0622 Gross per 24 hour  Intake 1959.19 ml  Output --  Net 1959.19 ml   Filed Weights   02/16/21 1055  Weight: (!) 147.4 kg    Examination:  General exam: Appears calm and comfortable. Morbidly obese  Respiratory system: diminished breath sounds b/l Cardiovascular system: S1 & S2+. No rubs, gallops or clicks.  Gastrointestinal system: Abdomen is obese, soft and nontender & hypoactive bowel sounds Central nervous system: Alert and oriented.Moves all extremities  Psychiatry: Judgement and insight appear normal. Flat mood and affect     Data Reviewed: I have personally reviewed following labs and imaging studies  CBC: Recent Labs  Lab 02/16/21 1055 02/17/21 0232  WBC 5.2 4.9  NEUTROABS  --  3.8  HGB 13.5 12.3*  HCT 38.5* 35.4*  MCV 84.1 83.5  PLT 120* 322*   Basic Metabolic Panel: Recent Labs  Lab  02/16/21 1055  NA 132*  K 4.5  CL 95*  CO2 25  GLUCOSE 263*  BUN 16  CREATININE 1.06  CALCIUM 9.4   GFR: Estimated Creatinine Clearance: 119.2 mL/min (by C-G formula based on SCr of 1.06 mg/dL). Liver Function Tests: No results for input(s): AST, ALT, ALKPHOS, BILITOT, PROT, ALBUMIN in the last 168 hours. No results for input(s): LIPASE, AMYLASE in the last 168 hours. No results for input(s): AMMONIA in the last 168 hours. Coagulation Profile: Recent Labs  Lab 02/16/21 1752 02/16/21 2333  INR 1.1 1.1   Cardiac Enzymes: No results for input(s): CKTOTAL, CKMB, CKMBINDEX, TROPONINI in the last 168  hours. BNP (last 3 results) No results for input(s): PROBNP in the last 8760 hours. HbA1C: No results for input(s): HGBA1C in the last 72 hours. CBG: Recent Labs  Lab 02/16/21 1426 02/16/21 1751 02/16/21 2301  GLUCAP 300* 421* 404*   Lipid Profile: No results for input(s): CHOL, HDL, LDLCALC, TRIG, CHOLHDL, LDLDIRECT in the last 72 hours. Thyroid Function Tests: No results for input(s): TSH, T4TOTAL, FREET4, T3FREE, THYROIDAB in the last 72 hours. Anemia Panel: Recent Labs    02/16/21 2333  FERRITIN 214   Sepsis Labs: Recent Labs  Lab 02/16/21 1330 02/16/21 1409 02/16/21 1752 02/16/21 2333 02/17/21 0232  PROCALCITON 0.15  --   --   --   --   LATICACIDVEN  --  2.3* 3.9* 4.0* 2.4*    Recent Results (from the past 240 hour(s))  Resp Panel by RT-PCR (Flu A&B, Covid) Nasopharyngeal Swab     Status: Abnormal   Collection Time: 02/16/21 11:52 AM   Specimen: Nasopharyngeal Swab; Nasopharyngeal(NP) swabs in vial transport medium  Result Value Ref Range Status   SARS Coronavirus 2 by RT PCR POSITIVE (A) NEGATIVE Final    Comment: RESULT CALLED TO, READ BACK BY AND VERIFIED WITH: SAM HAMILTON,RN AT 6203 ON 02/16/21 BY GM (NOTE) SARS-CoV-2 target nucleic acids are DETECTED.  The SARS-CoV-2 RNA is generally detectable in upper respiratory specimens during the acute phase of infection. Positive results are indicative of the presence of the identified virus, but do not rule out bacterial infection or co-infection with other pathogens not detected by the test. Clinical correlation with patient history and other diagnostic information is necessary to determine patient infection status. The expected result is Negative.  Fact Sheet for Patients: EntrepreneurPulse.com.au  Fact Sheet for Healthcare Providers: IncredibleEmployment.be  This test is not yet approved or cleared by the [place] FDA and  has been authorized for detection and/or  diagnosis of SARS-CoV-2 by FDA under an Emergency Use Authorization (EUA).  This EUA will remain in effect (meaning this test  can be used) for the duration of  the COVID-19 declaration under Section 564(b)(1) of the Act, 21 U.S.C. section 360bbb-3(b)(1), unless the authorization is terminated or revoked sooner.     Influenza A by PCR NEGATIVE NEGATIVE Final   Influenza B by PCR NEGATIVE NEGATIVE Final    Comment: (NOTE) The Xpert Xpress SARS-CoV-2/FLU/RSV plus assay is intended as an aid in the diagnosis of influenza from Nasopharyngeal swab specimens and should not be used as a sole basis for treatment. Nasal washings and aspirates are unacceptable for Xpert Xpress SARS-CoV-2/FLU/RSV testing.  Fact Sheet for Patients: EntrepreneurPulse.com.au  Fact Sheet for Healthcare Providers: IncredibleEmployment.be  This test is not yet approved or cleared by the [place] FDA and has been authorized for detection and/or diagnosis of SARS-CoV-2 by FDA under an Emergency Use Authorization (EUA).  This EUA will remain in effect (meaning this test can be used) for the duration of the COVID-19 declaration under Section 564(b)(1) of the Act, 21 U.S.C. section 360bbb-3(b)(1), unless the authorization is terminated or revoked.  Performed at [facility], [address] [place], [place] [address]   Culture, blood (Routine X 2) w Reflex to ID Panel     Status: None (Preliminary result)   Collection Time: 02/16/21 11:33 PM   Specimen: BLOOD  Result Value Ref Range Status   Specimen Description BLOOD RIGHT HAND  Final   Special Requests   Final    BOTTLES DRAWN AEROBIC AND ANAEROBIC Blood Culture adequate volume   Culture   Final    NO GROWTH < 12 HOURS Performed at [facility], [address] [place], [place] [address]    Report Status PENDING  Incomplete  Culture, blood (Routine X 2) w Reflex to ID Panel     Status: None  (Preliminary result)   Collection Time: 02/16/21 11:35 PM   Specimen: BLOOD  Result Value Ref Range Status   Specimen Description BLOOD RIGHT HAND  Final   Special Requests   Final    BOTTLES DRAWN AEROBIC AND ANAEROBIC Blood Culture adequate volume   Culture   Final    NO GROWTH < 12 HOURS Performed at [facility], [address] [place], [place] [address]    Report Status PENDING  Incomplete  MRSA Next Gen by PCR, Nasal     Status: None   Collection Time: 02/17/21 12:31 AM   Specimen: Nasal Mucosa; Nasal Swab  Result Value Ref Range Status   MRSA by PCR Next Gen NOT DETECTED NOT DETECTED Final    Comment: (NOTE) The GeneXpert MRSA Assay (FDA approved for NASAL specimens only), is one component of a comprehensive MRSA colonization surveillance program. It is not intended to diagnose MRSA infection nor to guide or monitor treatment for MRSA infections. Test performance is not FDA approved in patients less than 35 years old. Performed at [facility], [address] [place], [place] [address]          Radiology Studies: DG Chest 2 View  Result Date: 02/16/2021 CLINICAL DATA:  Cough, congestion shortness of breath. EXAM: CHEST - 2 VIEW COMPARISON:  03/29/2018 FINDINGS: The cardiac silhouette, mediastinal and hilar contours are within limits and stable. Patchy interstitial infiltrates could suggest COVID pneumonia. No focal airspace consolidation or pulmonary lesions. IMPRESSION: Patchy interstitial infiltrates could suggest COVID pneumonia. Electronically Signed   By: Marijo Sanes M.D.   On: 02/16/2021 11:53   CT Angio Chest PE W and/or Wo Contrast  Result Date: 02/16/2021 CLINICAL DATA:  55 year old male with history of shortness of breath, cough and congestion. COVID positive patient. Evaluate for pulmonary embolism. EXAM: CT ANGIOGRAPHY CHEST WITH CONTRAST TECHNIQUE: Multidetector CT imaging of the chest was performed using the standard protocol during  bolus administration of intravenous contrast. Multiplanar CT image reconstructions and MIPs were obtained to evaluate the vascular anatomy. CONTRAST:  43m OMNIPAQUE IOHEXOL 350 MG/ML SOLN COMPARISON:  No priors. FINDINGS: Comment: Study is limited for detection of pulmonary embolism by suboptimal contrast bolus, and excessive image noise from patient's large body habitus. Cardiovascular: Within the limitations of today's examination, there are no central, lobar or segmental sized filling defects to suggest clinically significant pulmonary embolism. Smaller subsegmental sized emboli cannot be entirely excluded secondary to the limitations of today's examination. Heart size is borderline enlarged. There is no significant pericardial  fluid, thickening or pericardial calcification. There is aortic atherosclerosis, as well as atherosclerosis of the great vessels of the mediastinum and the coronary arteries, including calcified atherosclerotic plaque in the left main, left anterior descending, left circumflex and right coronary arteries. Calcifications of the aortic valve. Mediastinum/Nodes: No pathologically enlarged mediastinal or hilar lymph nodes. Esophagus is unremarkable in appearance. No axillary lymphadenopathy. Lungs/Pleura: Elevation of the right hemidiaphragm. Some associated passive subsegmental atelectasis and/or scarring in the base of the right lung. No acute consolidative airspace disease. No pleural effusions. No definite suspicious appearing pulmonary nodules or masses are noted. Mild diffuse bronchial wall thickening with mild centrilobular and paraseptal emphysema. Upper Abdomen: Diffuse low attenuation throughout the visualized hepatic parenchyma, indicative of a background of hepatic steatosis. Musculoskeletal: There are no aggressive appearing lytic or blastic lesions noted in the visualized portions of the skeleton. Review of the MIP images confirms the above findings. IMPRESSION: 1. Despite the  limitations of today's examination, there is no evidence to suggest clinically significant central, lobar or segmental sized pulmonary embolism. 2. Aortic atherosclerosis, in addition to left main and 3 vessel coronary artery disease. Please note that although the presence of coronary artery calcium documents the presence of coronary artery disease, the severity of this disease and any potential stenosis cannot be assessed on this non-gated CT examination. Assessment for potential risk factor modification, dietary therapy or pharmacologic therapy may be warranted, if clinically indicated. 3. Mild diffuse bronchial wall thickening with mild centrilobular and paraseptal emphysema; imaging findings suggestive of underlying COPD. 4. There are calcifications of the aortic valve. Echocardiographic correlation for evaluation of potential valvular dysfunction may be warranted if clinically indicated. 5. Hepatic steatosis. Aortic Atherosclerosis (ICD10-I70.0) and Emphysema (ICD10-J43.9). Electronically Signed   By: Vinnie Langton M.D.   On: 02/16/2021 14:31   US Venous Img Lower Unilateral Left  Result Date: 02/16/2021 CLINICAL DATA:  Calf pain EXAM: LEFT LOWER EXTREMITY VENOUS DOPPLER ULTRASOUND TECHNIQUE: Gray-scale sonography with graded compression, as well as color Doppler and duplex ultrasound were performed to evaluate the lower extremity deep venous systems from the level of the common femoral vein and including the common femoral, femoral, profunda femoral, popliteal and calf veins including the posterior tibial, peroneal and gastrocnemius veins when visible. The superficial great saphenous vein was also interrogated. Spectral Doppler was utilized to evaluate flow at rest and with distal augmentation maneuvers in the common femoral, femoral and popliteal veins. COMPARISON:  None. FINDINGS: Contralateral Common Femoral Vein: Respiratory phasicity is normal and symmetric with the symptomatic side. No evidence of  thrombus. Normal compressibility. Common Femoral Vein: No evidence of thrombus. Normal compressibility, respiratory phasicity and response to augmentation. Saphenofemoral Junction: No evidence of thrombus. Normal compressibility and flow on color Doppler imaging. Profunda Femoral Vein: No evidence of thrombus. Normal compressibility and flow on color Doppler imaging. Femoral Vein: In the distal femoral vein, there is nonocclusive wall adherent thrombus. Popliteal Vein: In the femoral vein, there is nonocclusive wall adherent thrombus. Calf Veins: No evidence of thrombus. Normal compressibility and flow on color Doppler imaging. Other Findings:  None. 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 Electronically Signed   By: Albin Felling M.D.   On: 02/16/2021 12:43        Scheduled Meds:  amLODipine  10 mg Oral Daily   aspirin EC  81 mg Oral Daily   atorvastatin  80 mg Oral Daily   clopidogrel  75 mg Oral Q breakfast  FLUoxetine  40 mg Oral Daily   insulin aspart  0-5 Units Subcutaneous QHS   insulin aspart  0-9 Units Subcutaneous TID WC   insulin glargine-yfgn  45 Units Subcutaneous QHS   ipratropium  2 puff Inhalation Q4H   loratadine  10 mg Oral Daily   methylPREDNISolone (SOLU-MEDROL) injection  60 mg Intravenous Q12H   mometasone-formoterol  2 puff Inhalation BID   pregabalin  75 mg Oral BID   senna-docusate  1 tablet Oral QHS   tamsulosin  0.4 mg Oral Daily   topiramate  25 mg Oral BID   Continuous Infusions:  heparin 2,100 Units/hr (02/17/21 0622)   remdesivir 100 mg in NS 100 mL       LOS: 1 day    Time spent: 33 mins     Wyvonnia Dusky, MD [facility] Pager [phone]  02/17/2021, 8:07 AM

## 2021-02-17 NOTE — Progress Notes (Signed)
Inpatient Diabetes Program Recommendations  AACE/ADA: New Consensus Statement on Inpatient Glycemic Control   Target Ranges:  Prepandial:   less than 140 mg/dL      Peak postprandial:   less than 180 mg/dL (1-2 hours)      Critically ill patients:  140 - 180 mg/dL   Results for NICHALOS, BRENTON (MRN [id number]) as of 02/17/2021 08:33  Ref. Range 02/16/2021 14:26 02/16/2021 17:51 02/16/2021 23:01  Glucose-Capillary Latest Ref Range: 70 - 99 mg/dL 696 (H) 295 (H) 284 (H)   Review of Glycemic Control  Diabetes history: DM2 Outpatient Diabetes medications: Toujeo 65 units QHS, Novolog 20 units TID with meals, Metformin XR 1000 mg BID Current orders for Inpatient glycemic control: Semglee 45 units QHS, Novolog 0-9 units TID with meals, Novolog 0-5 units QHS; Solumedrol 60 mg Q12H  Inpatient Diabetes Program Recommendations:    Insulin: If steroids are continued as ordered, please consider increasing Semglee to 55 units QHS, increasing Novolog to 0-15 units TID with meals, and ordering Novolog 5 units TID with meals for meal coverage if patient eats at least 50% of meals.   Thanks, Orlando Penner, RN, MSN, CDE Diabetes Coordinator Inpatient Diabetes Program [phone] (Team Pager from 8am to 5pm)

## 2021-02-17 NOTE — TOC Initial Note (Signed)
Transition of Care [facility]) - Initial/Assessment Note    Patient Details  Name: Zachary Deleon MRN: [id number] Date of Birth: 1966/02/15  Transition of Care [facility]) CM/SW Contact:    Chapman Fitch, RN Phone Number: 02/17/2021, 4:12 PM  Clinical Narrative:                 Patient admitted from home  Patient covid positive Lives at home with 2 adult daughters, wife, and daughters fiance  PCP Hollice Espy - family transports Pharmacy - [facility] - denies issues obtaining medications  Has cane at home for ambulation  MD to order PT and OT Patient requiring acute O2  Wife agreeable to home health services states they do not have a preference of home health agency, referral made to [facility] with [facility]   Expected Discharge Plan: Home w Home Health Services Barriers to Discharge: Continued Medical Work up   Patient Goals and CMS Choice        Expected Discharge Plan and Services Expected Discharge Plan: Home w Home Health Services       Living arrangements for the past 2 months: Single Family Home                                      Prior Living Arrangements/Services Living arrangements for the past 2 months: Single Family Home Lives with:: Adult Children, Spouse Patient language and need for interpreter reviewed:: Yes Do you feel safe going back to the place where you live?: Yes      Need for Family Participation in Patient Care: Yes (Comment) Care giver support system in place?: Yes (comment) Current home services: DME Criminal Activity/Legal Involvement Pertinent to Current Situation/Hospitalization: No - Comment as needed  Activities of Daily Living Home Assistive Devices/Equipment: Cane (specify quad or straight), Shower chair with back, Other (Comment) (Lift chair) ADL Screening (condition at time of admission) Patient's cognitive ability adequate to safely complete daily activities?: Yes Is the patient deaf or have difficulty hearing?: No Does the patient have  difficulty seeing, even when wearing glasses/contacts?: No Does the patient have difficulty concentrating, remembering, or making decisions?: No Patient able to express need for assistance with ADLs?: Yes Does the patient have difficulty dressing or bathing?: Yes Independently performs ADLs?: No Does the patient have difficulty walking or climbing stairs?: Yes Weakness of Legs: Both Weakness of Arms/Hands: Both  Permission Sought/Granted                  Emotional Assessment       Orientation: : Oriented to Self, Oriented to Place, Oriented to  Time, Oriented to Situation Alcohol / Substance Use: Not Applicable Psych Involvement: No (comment)  Admission diagnosis:  Hypoxia [R09.02] Pneumonia due to COVID-19 virus [U07.1, J12.82] Acute respiratory disease due to COVID-19 virus [U07.1, J06.9] Patient Active Problem List   Diagnosis Date Noted   Acute respiratory disease due to COVID-19 virus 02/16/2021   DVT (deep venous thrombosis) (HCC)    Seizures (HCC)    Severe sepsis (HCC)    Thrombocytopenia (HCC)    Hypomagnesemia    Hyponatremia    Diabetes mellitus type 2 in obese (HCC)    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 ([address]), [place] - [address] [place] ([place]) [place] [address] Phone: [phone] Fax: [phone]     Social Determinants of Health (SDOH) Interventions    Readmission Risk Interventions Readmission Risk Prevention Plan 02/17/2021  Transportation Screening Complete  HRI or Home Care Consult Complete  Palliative  Care Screening Not Applicable  Medication Review (RN Care Manager) Complete  Some recent data might be hidden

## 2021-02-17 NOTE — Progress Notes (Signed)
ANTICOAGULATION CONSULT NOTE  Pharmacy Consult for IV heparin Indication: left leg DVT  Allergies  Allergen Reactions   Morphine And Related Other (See Comments)    Makes pt hyper    Patient Measurements: Height: 6\' 2"  (188 cm) Weight: (!) 147.4 kg (325 lb) IBW/kg (Calculated) : 82.2 Heparin Dosing Weight: 116.2 kg  Vital Signs: BP: 161/100 (09/07 2245) Pulse Rate: 105 (09/07 2245)  Labs: Recent Labs    02/16/21 1055 02/16/21 1330 02/16/21 1530 02/16/21 1752 02/16/21 2333 02/17/21 0232  HGB 13.5  --   --   --   --  12.3*  HCT 38.5*  --   --   --   --  35.4*  PLT 120*  --   --   --   --  108*  APTT  --   --   --  30  --   --   LABPROT  --   --   --  14.2 14.3  --   INR  --   --   --  1.1 1.1  --   HEPARINUNFRC  --   --   --   --  0.28*  --   CREATININE 1.06  --   --   --   --   --   TROPONINIHS  --  15 12  --   --   --      Estimated Creatinine Clearance: 119.2 mL/min (by C-G formula based on SCr of 1.06 mg/dL).   Medical History: Past Medical History:  Diagnosis Date   Arthritis    Carotid stenosis    COPD (chronic obstructive pulmonary disease) (HCC)    Depression    Diabetes mellitus without complication (HCC)    DVT (deep venous thrombosis) (HCC)    Hypertension    OSA (obstructive sleep apnea)    Restrictive lung disease    Seizures (HCC)    Status post carotid endarterectomy    with patch angioplasty   Stroke (HCC) 11/2017    Medications:  Medications Prior to Admission  Medication Sig Dispense Refill Last Dose   acetaminophen (TYLENOL) 325 MG tablet Take 2 tablets (650 mg total) by mouth every 4 (four) hours as needed for mild pain (or temp > 37.5 C (99.5 F)).   Past Week   albuterol (VENTOLIN HFA) 108 (90 Base) MCG/ACT inhaler Inhale 2 puffs into the lungs every 4 (four) hours as needed for wheezing or shortness of breath. 1 Inhaler 0 02/16/2021   amLODipine (NORVASC) 10 MG tablet Take 1 tablet (10 mg total) by mouth daily. 30 tablet 1 02/15/2021    aspirin 325 MG tablet Take 1 tablet (325 mg total) by mouth daily.   02/15/2021   aspirin 81 MG EC tablet Take 1 tablet by mouth daily.   02/15/2021   atorvastatin (LIPITOR) 80 MG tablet Take 1 tablet (80 mg total) by mouth daily. 30 tablet 1 02/15/2021   clopidogrel (PLAVIX) 75 MG tablet Take 1 tablet (75 mg total) by mouth daily with breakfast. 30 tablet 1 02/15/2021   cyclobenzaprine (FLEXERIL) 10 MG tablet Take 10 mg by mouth 2 (two) times daily as needed.   02/15/2021   diclofenac sodium (VOLTAREN) 1 % GEL Apply 2 g topically 4 (four) times daily. 1 Tube 1 Past Week   fluconazole (DIFLUCAN) 150 MG tablet    Past Month   FLUoxetine (PROZAC) 20 MG capsule Take 1 capsule (20 mg total) by mouth daily. 30 capsule 3 02/15/2021   FLUoxetine (  PROZAC) 40 MG capsule Take 40 mg by mouth daily.   02/15/2021   Fluticasone-Salmeterol (ADVAIR) 250-50 MCG/DOSE AEPB Inhale 1 puff into the lungs 2 (two) times daily. 1 each 1 02/16/2021   insulin aspart (NOVOLOG) 100 UNIT/ML FlexPen INJECT 20 UNITS UNDER THE SKIN THREE TIMES DAILY BEFORE MEALS PLUS SLIDING SCALE UP TO 80 UNITS EVERY DAY   02/15/2021   ketoconazole (NIZORAL) 2 % shampoo LATHER ON WET BEARD. LEAVE ON FOR 3-5 MINUTES AND RINSE. APPLY TWICE WEEKLY FOR 2-4 WEEKS   Past Week   lidocaine (LIDODERM) 5 % Place 1 patch onto the skin daily. Remove & Discard patch within 12 hours or as directed by MD 30 patch 0 Past Month   loratadine (CLARITIN) 10 MG tablet Take 1 tablet by mouth daily.   02/15/2021   metFORMIN (GLUCOPHAGE-XR) 500 MG 24 hr tablet Take 1,000 mg by mouth 2 (two) times daily.   02/15/2021   methocarbamol (ROBAXIN) 500 MG tablet Take 1 tablet (500 mg total) by mouth every 8 (eight) hours as needed for muscle spasms. 60 tablet 0 Past Week   nystatin (MYCOSTATIN/NYSTOP) powder Apply topically.   Past Month   pregabalin (LYRICA) 75 MG capsule Take 75 mg by mouth 2 (two) times daily.   02/15/2021   QUEtiapine (SEROQUEL) 25 MG tablet Take 0.5 tablets (12.5 mg total) by  mouth 2 (two) times daily as needed (agitation). 30 tablet 0 Past Month   senna-docusate (SENOKOT-S) 8.6-50 MG tablet Take 1 tablet by mouth at bedtime.   Past Month   tamsulosin (FLOMAX) 0.4 MG CAPS capsule Take 1 capsule by mouth daily.   02/15/2021   tiotropium (SPIRIVA) 18 MCG inhalation capsule Place 18 mcg into inhaler and inhale daily.   02/16/2021   topiramate (TOPAMAX) 25 MG tablet Take 1 tablet (25 mg total) by mouth 2 (two) times daily. 60 tablet 1 Past Month   TOUJEO SOLOSTAR 300 UNIT/ML Solostar Pen Inject into the skin.   02/15/2021   traMADol (ULTRAM) 50 MG tablet Take 0.5-1 tablets (25-50 mg total) by mouth every 6 (six) hours as needed for moderate pain or severe pain. 30 tablet 0 Past Week   fenofibrate 160 MG tablet Take 1 tablet (160 mg total) by mouth daily. (Patient not taking: No sig reported) 30 tablet 1 Not Taking   gabapentin (NEURONTIN) 100 MG capsule Take 1 capsule (100 mg total) by mouth 3 (three) times daily. (Patient not taking: No sig reported) 90 capsule 1 Completed Course   losartan (COZAAR) 50 MG tablet Take 75 mg by mouth daily.      nicotine (NICODERM CQ - DOSED IN MG/24 HOURS) 14 mg/24hr patch 14 mg patch daily x3 weeks then 7 mg patch daily x3 weeks and stop 28 patch 0    Scheduled:   amLODipine  10 mg Oral Daily   aspirin EC  81 mg Oral Daily   atorvastatin  80 mg Oral Daily   clopidogrel  75 mg Oral Q breakfast   FLUoxetine  40 mg Oral Daily   insulin aspart  0-5 Units Subcutaneous QHS   insulin aspart  0-9 Units Subcutaneous TID WC   insulin glargine-yfgn  45 Units Subcutaneous QHS   ipratropium  2 puff Inhalation Q4H   loratadine  10 mg Oral Daily   methylPREDNISolone (SOLU-MEDROL) injection  60 mg Intravenous Q12H   mometasone-formoterol  2 puff Inhalation BID   pregabalin  75 mg Oral BID   senna-docusate  1 tablet Oral QHS  tamsulosin  0.4 mg Oral Daily   topiramate  25 mg Oral BID   Infusions:   heparin 2,100 Units/hr (02/17/21 0238)    remdesivir 100 mg in NS 100 mL     PRN: acetaminophen, albuterol, cyclobenzaprine, dextromethorphan-guaiFENesin, hydrALAZINE, LORazepam, ondansetron (ZOFRAN) IV, traMADol Anti-infectives (From admission, onward)    Start     Dose/Rate Route Frequency Ordered Stop   02/17/21 1000  remdesivir 100 mg in sodium chloride 0.9 % 100 mL IVPB       See Hyperspace for full Linked Orders Report.   100 mg 200 mL/hr over 30 Minutes Intravenous Daily 02/16/21 1632 02/21/21 0959   02/16/21 1800  remdesivir 200 mg in sodium chloride 0.9% 250 mL IVPB       See Hyperspace for full Linked Orders Report.   200 mg 580 mL/hr over 30 Minutes Intravenous Once 02/16/21 1632 02/16/21 1818   02/16/21 1215  cefTRIAXone (ROCEPHIN) 1 g in sodium chloride 0.9 % 100 mL IVPB        1 g 200 mL/hr over 30 Minutes Intravenous  Once 02/16/21 1213 02/16/21 1446   02/16/21 1215  azithromycin (ZITHROMAX) 500 mg in sodium chloride 0.9 % 250 mL IVPB        500 mg 250 mL/hr over 60 Minutes Intravenous  Once 02/16/21 1213 02/16/21 1549       Assessment: 55YOM presenting with SOB, cough and congestion. 9/7 SARSCOV2 PCR positive, on remdesivir. 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 Per chart review, patient is not on anticoagulation at home. Baseline CBC stable.   Goal of Therapy:  Heparin level 0.3-0.7 units/ml Monitor platelets by anticoagulation protocol: Yes  9/7 2333 HL 0.28, subtherapeutic   Plan:  Bolus 1700 units x 1 Increase heparin infusion to 2100 units/hr Recheck HL in 6 hr after rate change Daily CBC.  Otelia Sergeant, PharmD, [facility] 02/17/2021 3:09 AM

## 2021-02-17 NOTE — Progress Notes (Signed)
ANTICOAGULATION CONSULT NOTE  Pharmacy Consult for IV heparin Indication: left leg DVT  Patient Measurements: Height: 6\' 2"  (188 cm) Weight: (!) 147.4 kg (325 lb) IBW/kg (Calculated) : 82.2 Heparin Dosing Weight: 116.2 kg  Vital Signs: Temp: 98.6 F (37 C) (09/08 1529) Temp Source: Oral (09/08 0655) BP: 225/172 (09/08 1529) Pulse Rate: 106 (09/08 1529)  Labs: Recent Labs    02/16/21 1055 02/16/21 1330 02/16/21 1530 02/16/21 1752 02/16/21 2333 02/17/21 0232 02/17/21 0835 02/17/21 1509  HGB 13.5  --   --   --   --  12.3*  --   --   HCT 38.5*  --   --   --   --  35.4*  --   --   PLT 120*  --   --   --   --  108*  --   --   APTT  --   --   --  30  --   --   --   --   LABPROT  --   --   --  14.2 14.3  --   --   --   INR  --   --   --  1.1 1.1  --   --   --   HEPARINUNFRC  --   --   --   --  0.28*  --  0.40 0.32  CREATININE 1.06  --   --   --   --   --  1.01  --   TROPONINIHS  --  15 12  --   --   --   --   --      Estimated Creatinine Clearance: 125.1 mL/min (by C-G formula based on SCr of 1.01 mg/dL).   Medical History: Past Medical History:  Diagnosis Date   Arthritis    Carotid stenosis    COPD (chronic obstructive pulmonary disease) (HCC)    Depression    Diabetes mellitus without complication (HCC)    DVT (deep venous thrombosis) (HCC)    Hypertension    OSA (obstructive sleep apnea)    Restrictive lung disease    Seizures (HCC)    Status post carotid endarterectomy    with patch angioplasty   Stroke (HCC) 11/2017    Medications:  Medications Prior to Admission  Medication Sig Dispense Refill Last Dose   acetaminophen (TYLENOL) 325 MG tablet Take 2 tablets (650 mg total) by mouth every 4 (four) hours as needed for mild pain (or temp > 37.5 C (99.5 F)).   Past Week   albuterol (VENTOLIN HFA) 108 (90 Base) MCG/ACT inhaler Inhale 2 puffs into the lungs every 4 (four) hours as needed for wheezing or shortness of breath. 1 Inhaler 0 02/16/2021   amLODipine  (NORVASC) 10 MG tablet Take 1 tablet (10 mg total) by mouth daily. 30 tablet 1 02/15/2021   aspirin 81 MG EC tablet Take 1 tablet by mouth daily.   02/15/2021   atorvastatin (LIPITOR) 80 MG tablet Take 1 tablet (80 mg total) by mouth daily. 30 tablet 1 02/15/2021   clopidogrel (PLAVIX) 75 MG tablet Take 1 tablet (75 mg total) by mouth daily with breakfast. 30 tablet 1 02/15/2021   cyclobenzaprine (FLEXERIL) 10 MG tablet Take 10 mg by mouth 2 (two) times daily as needed.   02/15/2021   diclofenac sodium (VOLTAREN) 1 % GEL Apply 2 g topically 4 (four) times daily. 1 Tube 1 Past Week   fluconazole (DIFLUCAN) 150 MG tablet  Past Month   FLUoxetine (PROZAC) 40 MG capsule Take 40 mg by mouth daily.   02/15/2021   Fluticasone-Salmeterol (ADVAIR) 250-50 MCG/DOSE AEPB Inhale 1 puff into the lungs 2 (two) times daily. 1 each 1 02/16/2021   insulin aspart (NOVOLOG) 100 UNIT/ML FlexPen INJECT 20 UNITS UNDER THE SKIN THREE TIMES DAILY BEFORE MEALS PLUS SLIDING SCALE UP TO 80 UNITS EVERY DAY   02/15/2021   ketoconazole (NIZORAL) 2 % shampoo LATHER ON WET BEARD. LEAVE ON FOR 3-5 MINUTES AND RINSE. APPLY TWICE WEEKLY FOR 2-4 WEEKS   Past Week   lidocaine (LIDODERM) 5 % Place 1 patch onto the skin daily. Remove & Discard patch within 12 hours or as directed by MD 30 patch 0 Past Month   loratadine (CLARITIN) 10 MG tablet Take 1 tablet by mouth daily.   02/15/2021   metFORMIN (GLUCOPHAGE-XR) 500 MG 24 hr tablet Take 1,000 mg by mouth 2 (two) times daily.   02/15/2021   methocarbamol (ROBAXIN) 500 MG tablet Take 1 tablet (500 mg total) by mouth every 8 (eight) hours as needed for muscle spasms. 60 tablet 0 Past Week   nystatin (MYCOSTATIN/NYSTOP) powder Apply topically.   Past Month   pregabalin (LYRICA) 75 MG capsule Take 75 mg by mouth 2 (two) times daily.   02/15/2021   QUEtiapine (SEROQUEL) 25 MG tablet Take 0.5 tablets (12.5 mg total) by mouth 2 (two) times daily as needed (agitation). 30 tablet 0 Past Month   senna-docusate  (SENOKOT-S) 8.6-50 MG tablet Take 1 tablet by mouth at bedtime.   Past Month   tamsulosin (FLOMAX) 0.4 MG CAPS capsule Take 1 capsule by mouth daily.   02/15/2021   tiotropium (SPIRIVA) 18 MCG inhalation capsule Place 18 mcg into inhaler and inhale daily.   02/16/2021   topiramate (TOPAMAX) 25 MG tablet Take 1 tablet (25 mg total) by mouth 2 (two) times daily. 60 tablet 1 Past Month   TOUJEO SOLOSTAR 300 UNIT/ML Solostar Pen Inject into the skin.   02/15/2021   traMADol (ULTRAM) 50 MG tablet Take 0.5-1 tablets (25-50 mg total) by mouth every 6 (six) hours as needed for moderate pain or severe pain. 30 tablet 0 Past Week   fenofibrate 160 MG tablet Take 1 tablet (160 mg total) by mouth daily. (Patient not taking: No sig reported) 30 tablet 1 Not Taking   gabapentin (NEURONTIN) 100 MG capsule Take 1 capsule (100 mg total) by mouth 3 (three) times daily. (Patient not taking: No sig reported) 90 capsule 1 Completed Course   losartan (COZAAR) 50 MG tablet Take 75 mg by mouth daily.      nicotine (NICODERM CQ - DOSED IN MG/24 HOURS) 14 mg/24hr patch 14 mg patch daily x3 weeks then 7 mg patch daily x3 weeks and stop 28 patch 0    Scheduled:   amLODipine  10 mg Oral Daily   atorvastatin  80 mg Oral Daily   clopidogrel  75 mg Oral Q breakfast   FLUoxetine  40 mg Oral Daily   [START ON 02/18/2021] influenza vac split quadrivalent PF  0.5 mL Intramuscular Tomorrow-1000   insulin aspart  0-15 Units Subcutaneous TID WC   insulin aspart  0-5 Units Subcutaneous QHS   insulin aspart  5 Units Subcutaneous TID WC   insulin glargine-yfgn  55 Units Subcutaneous QHS   ipratropium  2 puff Inhalation Q4H   loratadine  10 mg Oral Daily   methylPREDNISolone (SOLU-MEDROL) injection  60 mg Intravenous Q12H   mometasone-formoterol  2 puff Inhalation BID   pregabalin  75 mg Oral BID   senna-docusate  1 tablet Oral QHS   tamsulosin  0.4 mg Oral Daily   topiramate  25 mg Oral BID   Infusions:   heparin 2,100 Units/hr  (02/17/21 1423)   remdesivir 100 mg in NS 100 mL 100 mg (02/17/21 0921)   PRN: acetaminophen, albuterol, cyclobenzaprine, dextromethorphan-guaiFENesin, hydrALAZINE, LORazepam, ondansetron (ZOFRAN) IV, traMADol Anti-infectives (From admission, onward)    Start     Dose/Rate Route Frequency Ordered Stop   02/17/21 1000  remdesivir 100 mg in sodium chloride 0.9 % 100 mL IVPB       See Hyperspace for full Linked Orders Report.   100 mg 200 mL/hr over 30 Minutes Intravenous Daily 02/16/21 1632 02/21/21 0959   02/16/21 1800  remdesivir 200 mg in sodium chloride 0.9% 250 mL IVPB       See Hyperspace for full Linked Orders Report.   200 mg 580 mL/hr over 30 Minutes Intravenous Once 02/16/21 1632 02/16/21 1818   02/16/21 1215  cefTRIAXone (ROCEPHIN) 1 g in sodium chloride 0.9 % 100 mL IVPB        1 g 200 mL/hr over 30 Minutes Intravenous  Once 02/16/21 1213 02/16/21 1446   02/16/21 1215  azithromycin (ZITHROMAX) 500 mg in sodium chloride 0.9 % 250 mL IVPB        500 mg 250 mL/hr over 60 Minutes Intravenous  Once 02/16/21 1213 02/16/21 1549       Assessment: 55YOM presenting with SOB, cough and congestion. 9/7 SARSCOV2 PCR positive, on remdesivir. 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 Per chart review, patient is not on anticoagulation at home. There is noted thrombocytopenia at baseline: continue to follow.  0907 1900 units/hr; 2300 HL 0.28 0908 2100 units/hr; 0835 HL 0.40 0908 2100 units/hr; 1509 HL 0.32  Goal of Therapy:  Heparin level 0.3-0.7 units/ml Monitor platelets by anticoagulation protocol: Yes  Plan:  Confirmatory heparin level borderline therapeutic and trending down: will increase heparin infusion to 2150 units/hr 6 hour heparin level Daily CBC while on IV heparin   Jaynie Bream, PharmD Pharmacy Resident  02/17/2021 3:53 PM

## 2021-02-18 LAB — HEPARIN LEVEL (UNFRACTIONATED)
Heparin Unfractionated: 0.4 IU/mL (ref 0.30–0.70)
Heparin Unfractionated: 0.51 IU/mL (ref 0.30–0.70)

## 2021-02-18 LAB — BASIC METABOLIC PANEL
Anion gap: 6 (ref 5–15)
BUN: 24 mg/dL — ABNORMAL HIGH (ref 6–20)
CO2: 28 mmol/L (ref 22–32)
Calcium: 8.7 mg/dL — ABNORMAL LOW (ref 8.9–10.3)
Chloride: 98 mmol/L (ref 98–111)
Creatinine, Ser: 1.03 mg/dL (ref 0.61–1.24)
GFR, Estimated: >60 mL/min (ref >60)
Glucose, Bld: 317 mg/dL — ABNORMAL HIGH (ref 70–99)
Potassium: 4.3 mmol/L (ref 3.5–5.1)
Sodium: 132 mmol/L — ABNORMAL LOW (ref 135–145)

## 2021-02-18 LAB — GLUCOSE, CAPILLARY
Glucose-Capillary: 310 mg/dL — ABNORMAL HIGH (ref 70–99)
Glucose-Capillary: 350 mg/dL — ABNORMAL HIGH (ref 70–99)
Glucose-Capillary: 350 mg/dL — ABNORMAL HIGH (ref 70–99)
Glucose-Capillary: 321 mg/dL — ABNORMAL HIGH (ref 70–99)

## 2021-02-18 LAB — CBC
HCT: 36.2 % — ABNORMAL LOW (ref 39.0–52.0)
Hemoglobin: 12.4 g/dL — ABNORMAL LOW (ref 13.0–17.0)
MCH: 29.1 pg (ref 26.0–34.0)
MCHC: 34.3 g/dL (ref 30.0–36.0)
MCV: 85 fL (ref 80.0–100.0)
Platelets: 109 10*3/uL — ABNORMAL LOW (ref 150–400)
RBC: 4.26 MIL/uL (ref 4.22–5.81)
RDW: 14.2 % (ref 11.5–15.5)
WBC: 6.7 10*3/uL (ref 4.0–10.5)
nRBC: 0 % (ref 0.0–0.2)

## 2021-02-18 MED ORDER — APIXABAN 5 MG PO TABS
10.0000 mg | ORAL_TABLET | Freq: Two times a day (BID) | ORAL | Status: DC
  Administered 2021-02-18 – 2021-02-20 (×5): 10 mg via ORAL
  Filled 2021-02-18 (×6): qty 2

## 2021-02-18 MED ORDER — POLYETHYLENE GLYCOL 3350 17 G PO PACK
17.0000 g | PACK | Freq: Two times a day (BID) | ORAL | Status: DC
  Administered 2021-02-18 – 2021-02-20 (×4): 17 g via ORAL
  Filled 2021-02-18 (×5): qty 1

## 2021-02-18 MED ORDER — INSULIN ASPART 100 UNIT/ML IJ SOLN
15.0000 [IU] | Freq: Three times a day (TID) | INTRAMUSCULAR | Status: DC
  Administered 2021-02-18: 15 [IU] via SUBCUTANEOUS
  Filled 2021-02-18: qty 1

## 2021-02-18 MED ORDER — CYCLOBENZAPRINE HCL 10 MG PO TABS
10.0000 mg | ORAL_TABLET | Freq: Two times a day (BID) | ORAL | Status: DC
  Administered 2021-02-18 – 2021-02-20 (×4): 10 mg via ORAL
  Filled 2021-02-18 (×4): qty 1

## 2021-02-18 MED ORDER — INSULIN ASPART 100 UNIT/ML IJ SOLN
20.0000 [IU] | Freq: Three times a day (TID) | INTRAMUSCULAR | Status: DC
  Administered 2021-02-18 – 2021-02-20 (×6): 20 [IU] via SUBCUTANEOUS
  Filled 2021-02-18 (×6): qty 1

## 2021-02-18 MED ORDER — SENNOSIDES-DOCUSATE SODIUM 8.6-50 MG PO TABS
1.0000 | ORAL_TABLET | Freq: Two times a day (BID) | ORAL | Status: DC | PRN
  Administered 2021-02-18: 1 via ORAL
  Filled 2021-02-18: qty 1

## 2021-02-18 MED ORDER — INSULIN ASPART 100 UNIT/ML IJ SOLN
0.0000 [IU] | Freq: Every day | INTRAMUSCULAR | Status: DC
  Administered 2021-02-18: 4 [IU] via SUBCUTANEOUS
  Administered 2021-02-19: 5 [IU] via SUBCUTANEOUS
  Filled 2021-02-18 (×2): qty 1

## 2021-02-18 MED ORDER — INSULIN ASPART 100 UNIT/ML IJ SOLN
0.0000 [IU] | Freq: Three times a day (TID) | INTRAMUSCULAR | Status: DC
  Administered 2021-02-18 – 2021-02-19 (×3): 15 [IU] via SUBCUTANEOUS
  Administered 2021-02-19: 20 [IU] via SUBCUTANEOUS
  Administered 2021-02-19: 11 [IU] via SUBCUTANEOUS
  Administered 2021-02-20: 15 [IU] via SUBCUTANEOUS
  Administered 2021-02-20: 11 [IU] via SUBCUTANEOUS
  Filled 2021-02-18 (×7): qty 1

## 2021-02-18 MED ORDER — APIXABAN 5 MG PO TABS: 5.0000 mg | ORAL_TABLET | Freq: Two times a day (BID) | ORAL | Status: DC

## 2021-02-18 MED ORDER — INSULIN GLARGINE-YFGN 100 UNIT/ML ~~LOC~~ SOLN
65.0000 [IU] | Freq: Every day | SUBCUTANEOUS | Status: DC
  Administered 2021-02-18 – 2021-02-19 (×2): 65 [IU] via SUBCUTANEOUS
  Filled 2021-02-18 (×3): qty 0.65

## 2021-02-18 NOTE — Progress Notes (Signed)
ANTICOAGULATION CONSULT NOTE  Pharmacy Consult for IV heparin Indication: left leg DVT  Patient Measurements: Height: 6\' 2"  (188 cm) Weight: (!) 147.4 kg (325 lb) IBW/kg (Calculated) : 82.2 Heparin Dosing Weight: 116.2 kg  Vital Signs: Temp: 99.7 F (37.6 C) (09/08 1951) BP: 155/92 (09/08 1951) Pulse Rate: 48 (09/08 1951)  Labs: Recent Labs    02/16/21 1055 02/16/21 1055 02/16/21 1330 02/16/21 1530 02/16/21 1752 02/16/21 2333 02/17/21 0232 02/17/21 0835 02/17/21 1509 02/18/21 0053  HGB 13.5  --   --   --   --   --  12.3*  --   --  12.4*  HCT 38.5*  --   --   --   --   --  35.4*  --   --  36.2*  PLT 120*  --   --   --   --   --  108*  --   --  109*  APTT  --   --   --   --  30  --   --   --   --   --   LABPROT  --   --   --   --  14.2 14.3  --   --   --   --   INR  --   --   --   --  1.1 1.1  --   --   --   --   HEPARINUNFRC  --    < >  --   --   --  0.28*  --  0.40 0.32 0.40  CREATININE 1.06  --   --   --   --   --   --  1.01  --  1.03  TROPONINIHS  --   --  15 12  --   --   --   --   --   --    < > = values in this interval not displayed.     Estimated Creatinine Clearance: 122.7 mL/min (by C-G formula based on SCr of 1.03 mg/dL).   Medical History: Past Medical History:  Diagnosis Date   Arthritis    Carotid stenosis    COPD (chronic obstructive pulmonary disease) (HCC)    Depression    Diabetes mellitus without complication (HCC)    DVT (deep venous thrombosis) (HCC)    Hypertension    OSA (obstructive sleep apnea)    Restrictive lung disease    Seizures (HCC)    Status post carotid endarterectomy    with patch angioplasty   Stroke (HCC) 11/2017    Medications:  Medications Prior to Admission  Medication Sig Dispense Refill Last Dose   acetaminophen (TYLENOL) 325 MG tablet Take 2 tablets (650 mg total) by mouth every 4 (four) hours as needed for mild pain (or temp > 37.5 C (99.5 F)).   Past Week   albuterol (VENTOLIN HFA) 108 (90 Base) MCG/ACT  inhaler Inhale 2 puffs into the lungs every 4 (four) hours as needed for wheezing or shortness of breath. 1 Inhaler 0 02/16/2021   amLODipine (NORVASC) 10 MG tablet Take 1 tablet (10 mg total) by mouth daily. 30 tablet 1 02/15/2021   aspirin 81 MG EC tablet Take 1 tablet by mouth daily.   02/15/2021   atorvastatin (LIPITOR) 80 MG tablet Take 1 tablet (80 mg total) by mouth daily. 30 tablet 1 02/15/2021   clopidogrel (PLAVIX) 75 MG tablet Take 1 tablet (75 mg total) by mouth daily with breakfast. 30  tablet 1 02/15/2021   cyclobenzaprine (FLEXERIL) 10 MG tablet Take 10 mg by mouth 2 (two) times daily as needed.   02/15/2021   diclofenac sodium (VOLTAREN) 1 % GEL Apply 2 g topically 4 (four) times daily. 1 Tube 1 Past Week   fluconazole (DIFLUCAN) 150 MG tablet    Past Month   FLUoxetine (PROZAC) 40 MG capsule Take 40 mg by mouth daily.   02/15/2021   Fluticasone-Salmeterol (ADVAIR) 250-50 MCG/DOSE AEPB Inhale 1 puff into the lungs 2 (two) times daily. 1 each 1 02/16/2021   insulin aspart (NOVOLOG) 100 UNIT/ML FlexPen INJECT 20 UNITS UNDER THE SKIN THREE TIMES DAILY BEFORE MEALS PLUS SLIDING SCALE UP TO 80 UNITS EVERY DAY   02/15/2021   ketoconazole (NIZORAL) 2 % shampoo LATHER ON WET BEARD. LEAVE ON FOR 3-5 MINUTES AND RINSE. APPLY TWICE WEEKLY FOR 2-4 WEEKS   Past Week   lidocaine (LIDODERM) 5 % Place 1 patch onto the skin daily. Remove & Discard patch within 12 hours or as directed by MD 30 patch 0 Past Month   loratadine (CLARITIN) 10 MG tablet Take 1 tablet by mouth daily.   02/15/2021   metFORMIN (GLUCOPHAGE-XR) 500 MG 24 hr tablet Take 1,000 mg by mouth 2 (two) times daily.   02/15/2021   methocarbamol (ROBAXIN) 500 MG tablet Take 1 tablet (500 mg total) by mouth every 8 (eight) hours as needed for muscle spasms. 60 tablet 0 Past Week   nystatin (MYCOSTATIN/NYSTOP) powder Apply topically.   Past Month   pregabalin (LYRICA) 75 MG capsule Take 75 mg by mouth 2 (two) times daily.   02/15/2021   QUEtiapine (SEROQUEL) 25  MG tablet Take 0.5 tablets (12.5 mg total) by mouth 2 (two) times daily as needed (agitation). 30 tablet 0 Past Month   senna-docusate (SENOKOT-S) 8.6-50 MG tablet Take 1 tablet by mouth at bedtime.   Past Month   tamsulosin (FLOMAX) 0.4 MG CAPS capsule Take 1 capsule by mouth daily.   02/15/2021   tiotropium (SPIRIVA) 18 MCG inhalation capsule Place 18 mcg into inhaler and inhale daily.   02/16/2021   topiramate (TOPAMAX) 25 MG tablet Take 1 tablet (25 mg total) by mouth 2 (two) times daily. 60 tablet 1 Past Month   TOUJEO SOLOSTAR 300 UNIT/ML Solostar Pen Inject into the skin.   02/15/2021   traMADol (ULTRAM) 50 MG tablet Take 0.5-1 tablets (25-50 mg total) by mouth every 6 (six) hours as needed for moderate pain or severe pain. 30 tablet 0 Past Week   fenofibrate 160 MG tablet Take 1 tablet (160 mg total) by mouth daily. (Patient not taking: No sig reported) 30 tablet 1 Not Taking   gabapentin (NEURONTIN) 100 MG capsule Take 1 capsule (100 mg total) by mouth 3 (three) times daily. (Patient not taking: No sig reported) 90 capsule 1 Completed Course   losartan (COZAAR) 50 MG tablet Take 75 mg by mouth daily.      nicotine (NICODERM CQ - DOSED IN MG/24 HOURS) 14 mg/24hr patch 14 mg patch daily x3 weeks then 7 mg patch daily x3 weeks and stop 28 patch 0    Scheduled:   amLODipine  10 mg Oral Daily   atorvastatin  80 mg Oral Daily   clopidogrel  75 mg Oral Q breakfast   FLUoxetine  40 mg Oral Daily   influenza vac split quadrivalent PF  0.5 mL Intramuscular Tomorrow-1000   insulin aspart  0-15 Units Subcutaneous TID WC   insulin aspart  0-5  Units Subcutaneous QHS   insulin aspart  5 Units Subcutaneous TID WC   insulin glargine-yfgn  55 Units Subcutaneous QHS   ipratropium  2 puff Inhalation Q4H   loratadine  10 mg Oral Daily   methylPREDNISolone (SOLU-MEDROL) injection  60 mg Intravenous Q12H   mometasone-formoterol  2 puff Inhalation BID   pregabalin  75 mg Oral BID   senna-docusate  1 tablet  Oral QHS   tamsulosin  0.4 mg Oral Daily   topiramate  25 mg Oral BID   Infusions:   heparin 2,150 Units/hr (02/17/21 1751)   remdesivir 100 mg in NS 100 mL Stopped (02/17/21 0955)   PRN: acetaminophen, albuterol, cyclobenzaprine, dextromethorphan-guaiFENesin, fentaNYL (SUBLIMAZE) injection, hydrALAZINE, LORazepam, ondansetron (ZOFRAN) IV, traMADol Anti-infectives (From admission, onward)    Start     Dose/Rate Route Frequency Ordered Stop   02/17/21 1000  remdesivir 100 mg in sodium chloride 0.9 % 100 mL IVPB       See Hyperspace for full Linked Orders Report.   100 mg 200 mL/hr over 30 Minutes Intravenous Daily 02/16/21 1632 02/21/21 0959   02/16/21 1800  remdesivir 200 mg in sodium chloride 0.9% 250 mL IVPB       See Hyperspace for full Linked Orders Report.   200 mg 580 mL/hr over 30 Minutes Intravenous Once 02/16/21 1632 02/16/21 1818   02/16/21 1215  cefTRIAXone (ROCEPHIN) 1 g in sodium chloride 0.9 % 100 mL IVPB        1 g 200 mL/hr over 30 Minutes Intravenous  Once 02/16/21 1213 02/16/21 1446   02/16/21 1215  azithromycin (ZITHROMAX) 500 mg in sodium chloride 0.9 % 250 mL IVPB        500 mg 250 mL/hr over 60 Minutes Intravenous  Once 02/16/21 1213 02/16/21 1549       Assessment: 55YOM presenting with SOB, cough and congestion. 9/7 SARSCOV2 PCR positive, on remdesivir. 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 Per chart review, patient is not on anticoagulation at home. There is noted thrombocytopenia at baseline: continue to follow.  0907 1900 units/hr; 2300 HL 0.28 0908 2100 units/hr; 0835 HL 0.40 0908 2100 units/hr; 1509 HL 0.32 0909 2150 units/hr, 0053 HL 0.40  Goal of Therapy:  Heparin level 0.3-0.7 units/ml Monitor platelets by anticoagulation protocol: Yes  Plan:  HL therapeutic Continue heparin infusion at 2150 units/hr Recheck HL in 6 hrs to confirm Daily CBC while on  IV heparin   Otelia Sergeant, PharmD, [facility] 02/18/2021 2:23 AM

## 2021-02-18 NOTE — Progress Notes (Signed)
OT Cancellation Note  Patient Details Name: [name] MRN: [id number] DOB: 1965/08/02   Cancelled Treatment:    Reason Eval/Treat Not Completed: Fatigue/lethargy limiting ability to participate. OT calling in to room to speak to pt and daughter. Pt's daughter has been assisting pt with functional transfers and needs within the room. She reports, "At times he is his normal but then he is weak too. That is probably just from all of this (covid)". Pt declined OT intervention this morning secondary to fatigue. Family reports pt stating he does not want HH intervention at discharge. OT to re-attempt at next available time.   Jackquline Denmark, MS, OTR/L , CBIS ascom [phone]  02/18/21, 9:45 AM

## 2021-02-18 NOTE — Progress Notes (Signed)
ANTICOAGULATION CONSULT NOTE  Pharmacy Consult for transitioning from IV heparin to apixaban Indication: left leg DVT  Patient Measurements: Height: 6\' 2"  (188 cm) Weight: (!) 147.4 kg (325 lb) IBW/kg (Calculated) : 82.2 Heparin Dosing Weight: 116.2 kg  Vital Signs: Temp: 97.9 F (36.6 C) (09/09 0743) Temp Source: Oral (09/09 0743) BP: 144/88 (09/09 0743) Pulse Rate: 83 (09/09 0743)  Labs: Recent Labs    02/16/21 1055 02/16/21 1055 02/16/21 1330 02/16/21 1530 02/16/21 1752 02/16/21 2333 02/17/21 0232 02/17/21 0835 02/17/21 1509 02/18/21 0053 02/18/21 0832  HGB 13.5  --   --   --   --   --  12.3*  --   --  12.4*  --   HCT 38.5*  --   --   --   --   --  35.4*  --   --  36.2*  --   PLT 120*  --   --   --   --   --  108*  --   --  109*  --   APTT  --   --   --   --  30  --   --   --   --   --   --   LABPROT  --   --   --   --  14.2 14.3  --   --   --   --   --   INR  --   --   --   --  1.1 1.1  --   --   --   --   --   HEPARINUNFRC  --    < >  --   --   --  0.28*  --  0.40 0.32 0.40 0.51  CREATININE 1.06  --   --   --   --   --   --  1.01  --  1.03  --   TROPONINIHS  --   --  15 12  --   --   --   --   --   --   --    < > = values in this interval not displayed.     Estimated Creatinine Clearance: 122.7 mL/min (by C-G formula based on SCr of 1.03 mg/dL).   Medical History: Past Medical History:  Diagnosis Date   Arthritis    Carotid stenosis    COPD (chronic obstructive pulmonary disease) (HCC)    Depression    Diabetes mellitus without complication (HCC)    DVT (deep venous thrombosis) (HCC)    Hypertension    OSA (obstructive sleep apnea)    Restrictive lung disease    Seizures (HCC)    Status post carotid endarterectomy    with patch angioplasty   Stroke (HCC) 11/2017    Medications:  Medications Prior to Admission  Medication Sig Dispense Refill Last Dose   acetaminophen (TYLENOL) 325 MG tablet Take 2 tablets (650 mg total) by mouth every 4 (four)  hours as needed for mild pain (or temp > 37.5 C (99.5 F)).   Past Week   albuterol (VENTOLIN HFA) 108 (90 Base) MCG/ACT inhaler Inhale 2 puffs into the lungs every 4 (four) hours as needed for wheezing or shortness of breath. 1 Inhaler 0 02/16/2021   amLODipine (NORVASC) 10 MG tablet Take 1 tablet (10 mg total) by mouth daily. 30 tablet 1 02/15/2021   aspirin 81 MG EC tablet Take 1 tablet by mouth daily.   02/15/2021   atorvastatin (  LIPITOR) 80 MG tablet Take 1 tablet (80 mg total) by mouth daily. 30 tablet 1 02/15/2021   clopidogrel (PLAVIX) 75 MG tablet Take 1 tablet (75 mg total) by mouth daily with breakfast. 30 tablet 1 02/15/2021   cyclobenzaprine (FLEXERIL) 10 MG tablet Take 10 mg by mouth 2 (two) times daily as needed.   02/15/2021   diclofenac sodium (VOLTAREN) 1 % GEL Apply 2 g topically 4 (four) times daily. 1 Tube 1 Past Week   fluconazole (DIFLUCAN) 150 MG tablet    Past Month   FLUoxetine (PROZAC) 40 MG capsule Take 40 mg by mouth daily.   02/15/2021   Fluticasone-Salmeterol (ADVAIR) 250-50 MCG/DOSE AEPB Inhale 1 puff into the lungs 2 (two) times daily. 1 each 1 02/16/2021   insulin aspart (NOVOLOG) 100 UNIT/ML FlexPen INJECT 20 UNITS UNDER THE SKIN THREE TIMES DAILY BEFORE MEALS PLUS SLIDING SCALE UP TO 80 UNITS EVERY DAY   02/15/2021   ketoconazole (NIZORAL) 2 % shampoo LATHER ON WET BEARD. LEAVE ON FOR 3-5 MINUTES AND RINSE. APPLY TWICE WEEKLY FOR 2-4 WEEKS   Past Week   lidocaine (LIDODERM) 5 % Place 1 patch onto the skin daily. Remove & Discard patch within 12 hours or as directed by MD 30 patch 0 Past Month   loratadine (CLARITIN) 10 MG tablet Take 1 tablet by mouth daily.   02/15/2021   metFORMIN (GLUCOPHAGE-XR) 500 MG 24 hr tablet Take 1,000 mg by mouth 2 (two) times daily.   02/15/2021   methocarbamol (ROBAXIN) 500 MG tablet Take 1 tablet (500 mg total) by mouth every 8 (eight) hours as needed for muscle spasms. 60 tablet 0 Past Week   nystatin (MYCOSTATIN/NYSTOP) powder Apply topically.   Past  Month   pregabalin (LYRICA) 75 MG capsule Take 75 mg by mouth 2 (two) times daily.   02/15/2021   QUEtiapine (SEROQUEL) 25 MG tablet Take 0.5 tablets (12.5 mg total) by mouth 2 (two) times daily as needed (agitation). 30 tablet 0 Past Month   senna-docusate (SENOKOT-S) 8.6-50 MG tablet Take 1 tablet by mouth at bedtime.   Past Month   tamsulosin (FLOMAX) 0.4 MG CAPS capsule Take 1 capsule by mouth daily.   02/15/2021   tiotropium (SPIRIVA) 18 MCG inhalation capsule Place 18 mcg into inhaler and inhale daily.   02/16/2021   topiramate (TOPAMAX) 25 MG tablet Take 1 tablet (25 mg total) by mouth 2 (two) times daily. 60 tablet 1 Past Month   TOUJEO SOLOSTAR 300 UNIT/ML Solostar Pen Inject into the skin.   02/15/2021   traMADol (ULTRAM) 50 MG tablet Take 0.5-1 tablets (25-50 mg total) by mouth every 6 (six) hours as needed for moderate pain or severe pain. 30 tablet 0 Past Week   fenofibrate 160 MG tablet Take 1 tablet (160 mg total) by mouth daily. (Patient not taking: No sig reported) 30 tablet 1 Not Taking   gabapentin (NEURONTIN) 100 MG capsule Take 1 capsule (100 mg total) by mouth 3 (three) times daily. (Patient not taking: No sig reported) 90 capsule 1 Completed Course   losartan (COZAAR) 50 MG tablet Take 75 mg by mouth daily.      nicotine (NICODERM CQ - DOSED IN MG/24 HOURS) 14 mg/24hr patch 14 mg patch daily x3 weeks then 7 mg patch daily x3 weeks and stop 28 patch 0    Scheduled:   amLODipine  10 mg Oral Daily   atorvastatin  80 mg Oral Daily   clopidogrel  75 mg Oral Q breakfast  cyclobenzaprine  10 mg Oral BID   FLUoxetine  40 mg Oral Daily   influenza vac split quadrivalent PF  0.5 mL Intramuscular Tomorrow-1000   insulin aspart  0-20 Units Subcutaneous TID WC   insulin aspart  0-5 Units Subcutaneous QHS   insulin aspart  15 Units Subcutaneous TID WC   insulin glargine-yfgn  65 Units Subcutaneous QHS   ipratropium  2 puff Inhalation Q4H   loratadine  10 mg Oral Daily    methylPREDNISolone (SOLU-MEDROL) injection  60 mg Intravenous Q12H   mometasone-formoterol  2 puff Inhalation BID   polyethylene glycol  17 g Oral BID   pregabalin  75 mg Oral BID   senna-docusate  1 tablet Oral QHS   tamsulosin  0.4 mg Oral Daily   topiramate  25 mg Oral BID   Infusions:   heparin 2,150 Units/hr (02/18/21 6578)   remdesivir 100 mg in NS 100 mL 100 mg (02/18/21 0835)   PRN: acetaminophen, albuterol, dextromethorphan-guaiFENesin, fentaNYL (SUBLIMAZE) injection, hydrALAZINE, LORazepam, ondansetron (ZOFRAN) IV, senna-docusate, traMADol Anti-infectives (From admission, onward)    Start     Dose/Rate Route Frequency Ordered Stop   02/17/21 1000  remdesivir 100 mg in sodium chloride 0.9 % 100 mL IVPB       See Hyperspace for full Linked Orders Report.   100 mg 200 mL/hr over 30 Minutes Intravenous Daily 02/16/21 1632 02/21/21 0959   02/16/21 1800  remdesivir 200 mg in sodium chloride 0.9% 250 mL IVPB       See Hyperspace for full Linked Orders Report.   200 mg 580 mL/hr over 30 Minutes Intravenous Once 02/16/21 1632 02/16/21 1818   02/16/21 1215  cefTRIAXone (ROCEPHIN) 1 g in sodium chloride 0.9 % 100 mL IVPB        1 g 200 mL/hr over 30 Minutes Intravenous  Once 02/16/21 1213 02/16/21 1446   02/16/21 1215  azithromycin (ZITHROMAX) 500 mg in sodium chloride 0.9 % 250 mL IVPB        500 mg 250 mL/hr over 60 Minutes Intravenous  Once 02/16/21 1213 02/16/21 1549       Assessment: 55YOM presenting with SOB, cough and congestion. 9/7 SARSCOV2 PCR positive, on remdesivir. 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 Per chart review, patient is not on anticoagulation at home. He was started on IV heparin and is now being transitioned to a DOAC. There is noted thrombocytopenia at baseline which appears to be stable: continue to follow.  Goal of Therapy:  Monitor platelets by anticoagulation  protocol: Yes  Plan:  Stop heparin infusion Start apixaban 10 mg twice daily for 7 days followed by 5 mg twice daily CBC at least once weekly per protocol   Burnis Medin, PharmD 02/18/2021 10:36 AM

## 2021-02-18 NOTE — Progress Notes (Addendum)
SLP Cancellation Note  Patient Details Name: CHARLTON BOULE MRN: [id number] DOB: 1966/05/24   Cancelled treatment:       Reason Eval/Treat Not Completed: SLP screened, no needs identified, will sign off (chart reviewed; consulted NSG then met w/ pt/Family in room). Pt denied any difficulty swallowing and is currently on a regular diet; tolerates swallowing pills w/ water per NSG. Pt conversed in conversation w/out expressive/receptive deficits noted; pt denied any new speech-language deficits. Speech clear; mildly dysfluent when excited/rushed in his conversation -- pt stated this is Baseline for him, especially when he is experiencing "pain". Briefly discussed ways to calm and support his speech when feeling this way.  No further skilled ST services indicated as pt appears at his baseline. Pt agreed. NSG to reconsult if any change in status while admitted.        Orinda Kenner, MS, CCC-SLP Speech Language Pathologist Rehab Services [phone] [facility] 02/18/2021, 11:18 AM

## 2021-02-18 NOTE — Progress Notes (Signed)
PROGRESS NOTE    Zachary Deleon  XTG:[id number] DOB: 05/29/1966 DOA: 02/16/2021 PCP: Janyth Pupa, NP  Assessment & Plan:   Principal Problem:   Acute respiratory disease due to COVID-19 virus Active Problems:   Stroke [facility])   Type 2 diabetes mellitus without complication ([place])   Essential hypertension   Thrombocytopenia ([place])   DVT (deep venous thrombosis) (H[facility])   Seizures (H[facility])   Severe sepsis ([place])   Severe sepsis: met criteria w/ tachycardia, tachypnea, elevated lactic acid and COVID19 pneumonia. Continue on IV remdesivir, steroids and bronchodilators. Encourage incentive spirometry. Resolved   COVID-19 pneumonia: continue on IV remdesivir, IV steroids & bronchodialtors. Encourage incentive spirometry   Acute hypoxic respiratory failure: likely secondary to COVID19 pneumonia. Continue on supplemental oxygen and wean as tolerated    CVA: continue on statin, plavix. D/c aspirin as pt is on plavix and eliquis   DM2: likely poorly controlled. Continue on glargine, SSI w/ accuchecks     HTN:  continue on CCB. IV hydralazine prn    Thrombocytopenia: chronic and labile   DVT: of LLE. D/c IV heparin and start eliquis. D/c pt's home dose of aspirin. High risk of bleeding w/ eliquis and plavix. Pt verbalized his understanding. Previous hx of DVTs as per pt. Sedentary lifestyle. No recent long car or plane rides   Seizure: continue on home dose of topamax. Ativan prn. Seizure precautions   Hyponatremia: labile  DVT prophylaxis: eliquis  Code Status: DNR Family Communication: discussed pt's care w/ pt's family at bedside and answered their questions Disposition Plan: likely d/c back home   Level of care: Med-Surg  Status is: Inpatient  Remains inpatient appropriate because:IV treatments appropriate due to intensity of illness or inability to take PO and Inpatient level of care appropriate due to severity of illness  Dispo: The patient is from: Home               Anticipated d/c is to: Home              Patient currently is not medically stable to d/c.   Difficult to place patient : unclear      Consultants:    Procedures:   Antimicrobials:   Subjective: Pt c/o fatigue and shortness of breath w/ exertion   Objective: Vitals:   02/17/21 1552 02/17/21 1951 02/18/21 0422 02/18/21 0743  BP: (!) 145/72 (!) 155/92 120/67 (!) 144/88  Pulse: 96 (!) 48 75 83  Resp:  20  20  Temp:  99.7 F (37.6 C)  97.9 F (36.6 C)  TempSrc:    Oral  SpO2:  98% 98% 99%  Weight:      Height:        Intake/Output Summary (Last 24 hours) at 02/18/2021 0819 Last data filed at 02/17/2021 1500 Gross per 24 hour  Intake 275.72 ml  Output --  Net 275.72 ml   Filed Weights   02/16/21 1055  Weight: (!) 147.4 kg    Examination:  General exam: Appears comfortable. Morbidly obese Respiratory system: decreased breath sounds b/l. No rubs  Cardiovascular system: S1/S2+. No rubs or clicks   Gastrointestinal system: Abd is soft, NT, obese & hypoactive bowel sounds  Central nervous system: Alert and oriented. Moves all extremities  Psychiatry: Judgement and insight appear normal. Appropriate mood and affect      Data Reviewed: I have personally reviewed following labs and imaging studies  CBC: Recent Labs  Lab 02/16/21 1055 02/17/21 0232 02/18/21 0053  WBC 5.2  4.9 6.7  NEUTROABS  --  3.8  --   HGB 13.5 12.3* 12.4*  HCT 38.5* 35.4* 36.2*  MCV 84.1 83.5 85.0  PLT 120* 108* 026*   Basic Metabolic Panel: Recent Labs  Lab 02/16/21 1055 02/17/21 0835 02/18/21 0053  NA 132* 133* 132*  K 4.5 4.2 4.3  CL 95* 95* 98  CO2 $Re'25 22 28  'ZiL$ GLUCOSE 263* 295* 317*  BUN 16 21* 24*  CREATININE 1.06 1.01 1.03  CALCIUM 9.4 9.0 8.7*   GFR: Estimated Creatinine Clearance: 122.7 mL/min (by C-G formula based on SCr of 1.03 mg/dL). Liver Function Tests: No results for input(s): AST, ALT, ALKPHOS, BILITOT, PROT, ALBUMIN in the last 168 hours. No results for  input(s): LIPASE, AMYLASE in the last 168 hours. No results for input(s): AMMONIA in the last 168 hours. Coagulation Profile: Recent Labs  Lab 02/16/21 1752 02/16/21 2333  INR 1.1 1.1   Cardiac Enzymes: No results for input(s): CKTOTAL, CKMB, CKMBINDEX, TROPONINI in the last 168 hours. BNP (last 3 results) No results for input(s): PROBNP in the last 8760 hours. HbA1C: Recent Labs    02/17/21 0836  HGBA1C 10.5*   CBG: Recent Labs  Lab 02/17/21 0840 02/17/21 1217 02/17/21 1631 02/17/21 1955 02/18/21 0745  GLUCAP 288* 370* 443* 397* 310*   Lipid Profile: No results for input(s): CHOL, HDL, LDLCALC, TRIG, CHOLHDL, LDLDIRECT in the last 72 hours. Thyroid Function Tests: No results for input(s): TSH, T4TOTAL, FREET4, T3FREE, THYROIDAB in the last 72 hours. Anemia Panel: Recent Labs    02/16/21 2333  FERRITIN 214   Sepsis Labs: Recent Labs  Lab 02/16/21 1330 02/16/21 1409 02/16/21 1752 02/16/21 2333 02/17/21 0232  PROCALCITON 0.15  --   --   --   --   LATICACIDVEN  --  2.3* 3.9* 4.0* 2.4*    Recent Results (from the past 240 hour(s))  Resp Panel by RT-PCR (Flu A&B, Covid) Nasopharyngeal Swab     Status: Abnormal   Collection Time: 02/16/21 11:52 AM   Specimen: Nasopharyngeal Swab; Nasopharyngeal(NP) swabs in vial transport medium  Result Value Ref Range Status   SARS Coronavirus 2 by RT PCR POSITIVE (A) NEGATIVE Final    Comment: RESULT CALLED TO, READ BACK BY AND VERIFIED WITH: SAM HAMILTON,RN AT 3785 ON 02/16/21 BY GM (NOTE) SARS-CoV-2 target nucleic acids are DETECTED.  The SARS-CoV-2 RNA is generally detectable in upper respiratory specimens during the acute phase of infection. Positive results are indicative of the presence of the identified virus, but do not rule out bacterial infection or co-infection with other pathogens not detected by the test. Clinical correlation with patient history and other diagnostic information is necessary to determine  patient infection status. The expected result is Negative.  Fact Sheet for Patients: EntrepreneurPulse.com.au  Fact Sheet for Healthcare Providers: IncredibleEmployment.be  This test is not yet approved or cleared by the [place] FDA and  has been authorized for detection and/or diagnosis of SARS-CoV-2 by FDA under an Emergency Use Authorization (EUA).  This EUA will remain in effect (meaning this test  can be used) for the duration of  the COVID-19 declaration under Section 564(b)(1) of the Act, 21 U.S.C. section 360bbb-3(b)(1), unless the authorization is terminated or revoked sooner.     Influenza A by PCR NEGATIVE NEGATIVE Final   Influenza B by PCR NEGATIVE NEGATIVE Final    Comment: (NOTE) The Xpert Xpress SARS-CoV-2/FLU/RSV plus assay is intended as an aid in the diagnosis of influenza from Nasopharyngeal swab  specimens and should not be used as a sole basis for treatment. Nasal washings and aspirates are unacceptable for Xpert Xpress SARS-CoV-2/FLU/RSV testing.  Fact Sheet for Patients: EntrepreneurPulse.com.au  Fact Sheet for Healthcare Providers: IncredibleEmployment.be  This test is not yet approved or cleared by the [place] FDA and has been authorized for detection and/or diagnosis of SARS-CoV-2 by FDA under an Emergency Use Authorization (EUA). This EUA will remain in effect (meaning this test can be used) for the duration of the COVID-19 declaration under Section 564(b)(1) of the Act, 21 U.S.C. section 360bbb-3(b)(1), unless the authorization is terminated or revoked.  Performed at [facility], [address] [place], [place] [address]   Culture, blood (Routine X 2) w Reflex to ID Panel     Status: None (Preliminary result)   Collection Time: 02/16/21 11:33 PM   Specimen: BLOOD  Result Value Ref Range Status   Specimen Description BLOOD RIGHT HAND  Final    Special Requests   Final    BOTTLES DRAWN AEROBIC AND ANAEROBIC Blood Culture adequate volume   Culture   Final    NO GROWTH 2 DAYS Performed at [facility], [address] [place], [place] [address]    Report Status PENDING  Incomplete  Culture, blood (Routine X 2) w Reflex to ID Panel     Status: None (Preliminary result)   Collection Time: 02/16/21 11:35 PM   Specimen: BLOOD  Result Value Ref Range Status   Specimen Description BLOOD RIGHT HAND  Final   Special Requests   Final    BOTTLES DRAWN AEROBIC AND ANAEROBIC Blood Culture adequate volume   Culture   Final    NO GROWTH 2 DAYS Performed at [facility], [address] [place], [place] [address]    Report Status PENDING  Incomplete  MRSA Next Gen by PCR, Nasal     Status: None   Collection Time: 02/17/21 12:31 AM   Specimen: Nasal Mucosa; Nasal Swab  Result Value Ref Range Status   MRSA by PCR Next Gen NOT DETECTED NOT DETECTED Final    Comment: (NOTE) The GeneXpert MRSA Assay (FDA approved for NASAL specimens only), is one component of a comprehensive MRSA colonization surveillance program. It is not intended to diagnose MRSA infection nor to guide or monitor treatment for MRSA infections. Test performance is not FDA approved in patients less than 19 years old. Performed at [facility], [address], [place], [place] [address]          Radiology Studies: DG Chest 2 View  Result Date: 02/16/2021 CLINICAL DATA:  Cough, congestion shortness of breath. EXAM: CHEST - 2 VIEW COMPARISON:  03/29/2018 FINDINGS: The cardiac silhouette, mediastinal and hilar contours are within limits and stable. Patchy interstitial infiltrates could suggest COVID pneumonia. No focal airspace consolidation or pulmonary lesions. IMPRESSION: Patchy interstitial infiltrates could suggest COVID pneumonia. Electronically Signed   By: Marijo Sanes M.D.   On: 02/16/2021 11:53   CT Angio Chest PE W and/or Wo  Contrast  Result Date: 02/16/2021 CLINICAL DATA:  55 year old male with history of shortness of breath, cough and congestion. COVID positive patient. Evaluate for pulmonary embolism. EXAM: CT ANGIOGRAPHY CHEST WITH CONTRAST TECHNIQUE: Multidetector CT imaging of the chest was performed using the standard protocol during bolus administration of intravenous contrast. Multiplanar CT image reconstructions and MIPs were obtained to evaluate the vascular anatomy. CONTRAST:  41mL OMNIPAQUE IOHEXOL 350 MG/ML SOLN COMPARISON:  No priors. FINDINGS: Comment: Study is limited  for detection of pulmonary embolism by suboptimal contrast bolus, and excessive image noise from patient's large body habitus. Cardiovascular: Within the limitations of today's examination, there are no central, lobar or segmental sized filling defects to suggest clinically significant pulmonary embolism. Smaller subsegmental sized emboli cannot be entirely excluded secondary to the limitations of today's examination. Heart size is borderline enlarged. There is no significant pericardial fluid, thickening or pericardial calcification. There is aortic atherosclerosis, as well as atherosclerosis of the great vessels of the mediastinum and the coronary arteries, including calcified atherosclerotic plaque in the left main, left anterior descending, left circumflex and right coronary arteries. Calcifications of the aortic valve. Mediastinum/Nodes: No pathologically enlarged mediastinal or hilar lymph nodes. Esophagus is unremarkable in appearance. No axillary lymphadenopathy. Lungs/Pleura: Elevation of the right hemidiaphragm. Some associated passive subsegmental atelectasis and/or scarring in the base of the right lung. No acute consolidative airspace disease. No pleural effusions. No definite suspicious appearing pulmonary nodules or masses are noted. Mild diffuse bronchial wall thickening with mild centrilobular and paraseptal emphysema. Upper Abdomen:  Diffuse low attenuation throughout the visualized hepatic parenchyma, indicative of a background of hepatic steatosis. Musculoskeletal: There are no aggressive appearing lytic or blastic lesions noted in the visualized portions of the skeleton. Review of the MIP images confirms the above findings. IMPRESSION: 1. Despite the limitations of today's examination, there is no evidence to suggest clinically significant central, lobar or segmental sized pulmonary embolism. 2. Aortic atherosclerosis, in addition to left main and 3 vessel coronary artery disease. Please note that although the presence of coronary artery calcium documents the presence of coronary artery disease, the severity of this disease and any potential stenosis cannot be assessed on this non-gated CT examination. Assessment for potential risk factor modification, dietary therapy or pharmacologic therapy may be warranted, if clinically indicated. 3. Mild diffuse bronchial wall thickening with mild centrilobular and paraseptal emphysema; imaging findings suggestive of underlying COPD. 4. There are calcifications of the aortic valve. Echocardiographic correlation for evaluation of potential valvular dysfunction may be warranted if clinically indicated. 5. Hepatic steatosis. Aortic Atherosclerosis (ICD10-I70.0) and Emphysema (ICD10-J43.9). Electronically Signed   By: Vinnie Langton M.D.   On: 02/16/2021 14:31   US Venous Img Lower Unilateral Left  Result Date: 02/16/2021 CLINICAL DATA:  Calf pain EXAM: LEFT LOWER EXTREMITY VENOUS DOPPLER ULTRASOUND TECHNIQUE: Gray-scale sonography with graded compression, as well as color Doppler and duplex ultrasound were performed to evaluate the lower extremity deep venous systems from the level of the common femoral vein and including the common femoral, femoral, profunda femoral, popliteal and calf veins including the posterior tibial, peroneal and gastrocnemius veins when visible. The superficial great saphenous  vein was also interrogated. Spectral Doppler was utilized to evaluate flow at rest and with distal augmentation maneuvers in the common femoral, femoral and popliteal veins. COMPARISON:  None. FINDINGS: Contralateral Common Femoral Vein: Respiratory phasicity is normal and symmetric with the symptomatic side. No evidence of thrombus. Normal compressibility. Common Femoral Vein: No evidence of thrombus. Normal compressibility, respiratory phasicity and response to augmentation. Saphenofemoral Junction: No evidence of thrombus. Normal compressibility and flow on color Doppler imaging. Profunda Femoral Vein: No evidence of thrombus. Normal compressibility and flow on color Doppler imaging. Femoral Vein: In the distal femoral vein, there is nonocclusive wall adherent thrombus. Popliteal Vein: In the femoral vein, there is nonocclusive wall adherent thrombus. Calf Veins: No evidence of thrombus. Normal compressibility and flow on color Doppler imaging. Other Findings:  None. IMPRESSION: There is chronic-appearing, nonocclusive thrombus  limited to the left distal femoral vein and popliteal vein. Electronically Signed   By: Albin Felling M.D.   On: 02/16/2021 12:43        Scheduled Meds:  amLODipine  10 mg Oral Daily   atorvastatin  80 mg Oral Daily   clopidogrel  75 mg Oral Q breakfast   FLUoxetine  40 mg Oral Daily   influenza vac split quadrivalent PF  0.5 mL Intramuscular Tomorrow-1000   insulin aspart  0-15 Units Subcutaneous TID WC   insulin aspart  0-5 Units Subcutaneous QHS   insulin aspart  5 Units Subcutaneous TID WC   insulin glargine-yfgn  55 Units Subcutaneous QHS   ipratropium  2 puff Inhalation Q4H   loratadine  10 mg Oral Daily   methylPREDNISolone (SOLU-MEDROL) injection  60 mg Intravenous Q12H   mometasone-formoterol  2 puff Inhalation BID   polyethylene glycol  17 g Oral BID   pregabalin  75 mg Oral BID   senna-docusate  1 tablet Oral QHS   tamsulosin  0.4 mg Oral Daily    topiramate  25 mg Oral BID   Continuous Infusions:  heparin 2,150 Units/hr (02/18/21 6190)   remdesivir 100 mg in NS 100 mL Stopped (02/17/21 0955)     LOS: 2 days    Time spent: 31 mins     Wyvonnia Dusky, MD [facility] Pager [phone]  02/18/2021, 8:19 AM

## 2021-02-18 NOTE — Progress Notes (Signed)
Physical Therapy Evaluation Patient Details Name: Zachary Deleon MRN: [id number] DOB: August 17, 1965 Today's Date: 02/18/2021   History of Present Illness  Zachary Deleon is a 55 y.o. 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 Admitted for acute respiratory diseae due to COVID-19 virus.  Clinical Impression  Pt is a pleasant 55 year old male presented with increased shortness of breath and admitted for acute respiratory disease due to COVID-19 virus. Pt accompanied by daughter who assists patient with recall due to PMH of brain tumor removal. Pt has PMH of R CVA with L hemiparesis, DM, HTN, and COPD. Pt currently on 4L of O2 [place] (Sp02> 90% throughout) and reports not having O2 at baseline. Pt currently able to ambulate in room with CGA/SBA and SPC and reports only ambulating in the home at baseline. Pt demonstrating impaired activity tolerance, sensation, and strength. Recommending home health PT at discharge to improve mobility and quality of life. Pt will continue to benefit from skilled PT services during admission to improve mobility, and to reduce readmission and fall risk.     Follow Up Recommendations Home health PT;Supervision for mobility/OOB    Equipment Recommendations  None recommended by PT    Recommendations for Other Services       Precautions / Restrictions Precautions Precautions: Fall Restrictions Weight Bearing Restrictions: No      Mobility  Bed Mobility      General bed mobility comments: Bed mobility defered at this time due to patient received seated EOB    Transfers Overall transfer level: Needs assistance Equipment used: 1 person hand held assist Transfers: Sit to/from Stand Sit to Stand: Min assist         General  transfer comment: MinA for standing at EOB. Pt requesting to stand due to leg cramp. Pt stood again using 1 UE assist and used SPC once standing  Ambulation/Gait Ambulation/Gait assistance: Min guard Gait Distance (Feet): 10 Feet Assistive device: Straight cane Gait Pattern/deviations: Step-to pattern;Wide base of support;Trunk flexed;Decreased weight shift to right;Decreased step length - right;Decreased step length - left Gait velocity: slow   General Gait Details: Pt demonstrating SOB with ambulation which is limiting his ambulation distances. Pt requiring CGA for safety with ambulation due to LLE weakness.  Stairs            Wheelchair Mobility    Modified Rankin (Stroke Patients Only)       Balance Overall balance assessment: Mild deficits observed, not formally tested          Pertinent Vitals/Pain Pain Assessment: Faces Faces Pain Scale: Hurts little more Pain Location: L pectoral region Pain Descriptors / Indicators: Sore Pain Intervention(s): Limited activity within patient's tolerance;Monitored during session;Patient requesting pain meds-RN notified    Home Living Family/patient expects to be discharged to:: Private residence Living Arrangements: Spouse/significant other;Children Available Help at Discharge: Family;Available 24 hours/day Type of Home: House Home Access: Ramped entrance     Home Layout: Able to live on main level with bedroom/bathroom;One level Home Equipment: Shower seat;Shower seat - built in;Cane - single point Additional Comments: Home is handicap accessible per daughter    Prior Function Level of Independence: Needs assistance   Gait / Transfers Assistance Needed: Ambulates only household distances mod I with cane  ADL's / Homemaking Assistance Needed: Requires assistance with dressing from wife  Hand Dominance   Dominant Hand: Right    Extremity/Trunk Assessment   Upper Extremity Assessment Upper Extremity  Assessment: LUE deficits/detail LUE Deficits / Details: Hemiparetic; Has some shoulder flexion    Lower Extremity Assessment Lower Extremity Assessment: LLE deficits/detail;Overall [facility] for tasks assessed LLE Deficits / Details: Decreased sensation in medial knee and lateral foot. Grossly 3/5 MMT during functional mobility. 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 Daughter there to assist with answer questiongs      General Comments      Exercises     Assessment/Plan    PT Assessment Patient needs continued PT services  PT Problem List Decreased strength;Decreased range of motion;Decreased activity tolerance;Decreased balance;Decreased mobility;Decreased cognition;Cardiopulmonary status limiting activity;Impaired sensation;Impaired tone;Pain;Obesity       PT Treatment Interventions DME instruction;Gait training;Stair training;Functional mobility training;Therapeutic activities;Therapeutic exercise;Balance training;Neuromuscular re-education;Patient/family education    PT Goals (Current goals can be found in the Care Plan section)  Acute Rehab PT Goals Patient Stated Goal: to be able to improve his endurance to improve his quality of life PT Goal Formulation: With patient Time For Goal Achievement: 03/04/21 Potential to Achieve Goals: Good    Frequency Min 2X/week   Barriers to discharge        Co-evaluation               AM-PAC PT "6 Clicks" Mobility  Outcome Measure Help needed turning from your back to your side while in a flat bed without using bedrails?: A Little Help needed moving from lying on your back  to sitting on the side of a flat bed without using bedrails?: A Little Help needed moving to and from a bed to a chair (including a wheelchair)?: A Little Help needed standing up from a chair using your arms (e.g., wheelchair or bedside chair)?: A Little Help needed to walk in hospital room?: A Little Help needed climbing 3-5 steps with a railing? : Total 6 Click Score: 16    End of Session   Activity Tolerance: Patient tolerated treatment well Patient left: in chair;with call bell/phone within reach;with family/visitor present Nurse Communication: Mobility status;Patient requests pain meds;Other (comment) (Per RN, pt ambulating in room with daughter's assistance and no need for chair alarm) PT Visit Diagnosis: Unsteadiness on feet (R26.81);Other abnormalities of gait and mobility (R26.89);Muscle weakness (generalized) (M62.81);Hemiplegia and hemiparesis Hemiplegia - Right/Left: Left Hemiplegia - dominant/non-dominant: Non-dominant Hemiplegia - caused by: Cerebral infarction    Time: 8182-9937 PT Time Calculation (min) (ACUTE ONLY): 28 min   Charges:   PT Evaluation $PT Eval Moderate Complexity: 1 [address] SPT   Verl Blalock 02/18/2021, 4:26 PM

## 2021-02-18 NOTE — Progress Notes (Signed)
ANTICOAGULATION CONSULT NOTE  Pharmacy Consult for IV heparin Indication: left leg DVT  Patient Measurements: Height: 6\' 2"  (188 cm) Weight: (!) 147.4 kg (325 lb) IBW/kg (Calculated) : 82.2 Heparin Dosing Weight: 116.2 kg  Vital Signs: Temp: 99.7 F (37.6 C) (09/08 1951) BP: 120/67 (09/09 0422) Pulse Rate: 75 (09/09 0422)  Labs: Recent Labs    02/16/21 1055 02/16/21 1055 02/16/21 1330 02/16/21 1530 02/16/21 1752 02/16/21 2333 02/17/21 0232 02/17/21 0835 02/17/21 1509 02/18/21 0053  HGB 13.5  --   --   --   --   --  12.3*  --   --  12.4*  HCT 38.5*  --   --   --   --   --  35.4*  --   --  36.2*  PLT 120*  --   --   --   --   --  108*  --   --  109*  APTT  --   --   --   --  30  --   --   --   --   --   LABPROT  --   --   --   --  14.2 14.3  --   --   --   --   INR  --   --   --   --  1.1 1.1  --   --   --   --   HEPARINUNFRC  --    < >  --   --   --  0.28*  --  0.40 0.32 0.40  CREATININE 1.06  --   --   --   --   --   --  1.01  --  1.03  TROPONINIHS  --   --  15 12  --   --   --   --   --   --    < > = values in this interval not displayed.     Estimated Creatinine Clearance: 122.7 mL/min (by C-G formula based on SCr of 1.03 mg/dL).   Medical History: Past Medical History:  Diagnosis Date   Arthritis    Carotid stenosis    COPD (chronic obstructive pulmonary disease) (HCC)    Depression    Diabetes mellitus without complication (HCC)    DVT (deep venous thrombosis) (HCC)    Hypertension    OSA (obstructive sleep apnea)    Restrictive lung disease    Seizures (HCC)    Status post carotid endarterectomy    with patch angioplasty   Stroke (HCC) 11/2017    Medications:  Medications Prior to Admission  Medication Sig Dispense Refill Last Dose   acetaminophen (TYLENOL) 325 MG tablet Take 2 tablets (650 mg total) by mouth every 4 (four) hours as needed for mild pain (or temp > 37.5 C (99.5 F)).   Past Week   albuterol (VENTOLIN HFA) 108 (90 Base) MCG/ACT  inhaler Inhale 2 puffs into the lungs every 4 (four) hours as needed for wheezing or shortness of breath. 1 Inhaler 0 02/16/2021   amLODipine (NORVASC) 10 MG tablet Take 1 tablet (10 mg total) by mouth daily. 30 tablet 1 02/15/2021   aspirin 81 MG EC tablet Take 1 tablet by mouth daily.   02/15/2021   atorvastatin (LIPITOR) 80 MG tablet Take 1 tablet (80 mg total) by mouth daily. 30 tablet 1 02/15/2021   clopidogrel (PLAVIX) 75 MG tablet Take 1 tablet (75 mg total) by mouth daily with breakfast. 30  tablet 1 02/15/2021   cyclobenzaprine (FLEXERIL) 10 MG tablet Take 10 mg by mouth 2 (two) times daily as needed.   02/15/2021   diclofenac sodium (VOLTAREN) 1 % GEL Apply 2 g topically 4 (four) times daily. 1 Tube 1 Past Week   fluconazole (DIFLUCAN) 150 MG tablet    Past Month   FLUoxetine (PROZAC) 40 MG capsule Take 40 mg by mouth daily.   02/15/2021   Fluticasone-Salmeterol (ADVAIR) 250-50 MCG/DOSE AEPB Inhale 1 puff into the lungs 2 (two) times daily. 1 each 1 02/16/2021   insulin aspart (NOVOLOG) 100 UNIT/ML FlexPen INJECT 20 UNITS UNDER THE SKIN THREE TIMES DAILY BEFORE MEALS PLUS SLIDING SCALE UP TO 80 UNITS EVERY DAY   02/15/2021   ketoconazole (NIZORAL) 2 % shampoo LATHER ON WET BEARD. LEAVE ON FOR 3-5 MINUTES AND RINSE. APPLY TWICE WEEKLY FOR 2-4 WEEKS   Past Week   lidocaine (LIDODERM) 5 % Place 1 patch onto the skin daily. Remove & Discard patch within 12 hours or as directed by MD 30 patch 0 Past Month   loratadine (CLARITIN) 10 MG tablet Take 1 tablet by mouth daily.   02/15/2021   metFORMIN (GLUCOPHAGE-XR) 500 MG 24 hr tablet Take 1,000 mg by mouth 2 (two) times daily.   02/15/2021   methocarbamol (ROBAXIN) 500 MG tablet Take 1 tablet (500 mg total) by mouth every 8 (eight) hours as needed for muscle spasms. 60 tablet 0 Past Week   nystatin (MYCOSTATIN/NYSTOP) powder Apply topically.   Past Month   pregabalin (LYRICA) 75 MG capsule Take 75 mg by mouth 2 (two) times daily.   02/15/2021   QUEtiapine (SEROQUEL) 25  MG tablet Take 0.5 tablets (12.5 mg total) by mouth 2 (two) times daily as needed (agitation). 30 tablet 0 Past Month   senna-docusate (SENOKOT-S) 8.6-50 MG tablet Take 1 tablet by mouth at bedtime.   Past Month   tamsulosin (FLOMAX) 0.4 MG CAPS capsule Take 1 capsule by mouth daily.   02/15/2021   tiotropium (SPIRIVA) 18 MCG inhalation capsule Place 18 mcg into inhaler and inhale daily.   02/16/2021   topiramate (TOPAMAX) 25 MG tablet Take 1 tablet (25 mg total) by mouth 2 (two) times daily. 60 tablet 1 Past Month   TOUJEO SOLOSTAR 300 UNIT/ML Solostar Pen Inject into the skin.   02/15/2021   traMADol (ULTRAM) 50 MG tablet Take 0.5-1 tablets (25-50 mg total) by mouth every 6 (six) hours as needed for moderate pain or severe pain. 30 tablet 0 Past Week   fenofibrate 160 MG tablet Take 1 tablet (160 mg total) by mouth daily. (Patient not taking: No sig reported) 30 tablet 1 Not Taking   gabapentin (NEURONTIN) 100 MG capsule Take 1 capsule (100 mg total) by mouth 3 (three) times daily. (Patient not taking: No sig reported) 90 capsule 1 Completed Course   losartan (COZAAR) 50 MG tablet Take 75 mg by mouth daily.      nicotine (NICODERM CQ - DOSED IN MG/24 HOURS) 14 mg/24hr patch 14 mg patch daily x3 weeks then 7 mg patch daily x3 weeks and stop 28 patch 0    Scheduled:   amLODipine  10 mg Oral Daily   atorvastatin  80 mg Oral Daily   clopidogrel  75 mg Oral Q breakfast   FLUoxetine  40 mg Oral Daily   influenza vac split quadrivalent PF  0.5 mL Intramuscular Tomorrow-1000   insulin aspart  0-15 Units Subcutaneous TID WC   insulin aspart  0-5  Units Subcutaneous QHS   insulin aspart  5 Units Subcutaneous TID WC   insulin glargine-yfgn  55 Units Subcutaneous QHS   ipratropium  2 puff Inhalation Q4H   loratadine  10 mg Oral Daily   methylPREDNISolone (SOLU-MEDROL) injection  60 mg Intravenous Q12H   mometasone-formoterol  2 puff Inhalation BID   pregabalin  75 mg Oral BID   senna-docusate  1 tablet  Oral QHS   tamsulosin  0.4 mg Oral Daily   topiramate  25 mg Oral BID   Infusions:   heparin 2,150 Units/hr (02/18/21 6301)   remdesivir 100 mg in NS 100 mL Stopped (02/17/21 0955)   PRN: acetaminophen, albuterol, cyclobenzaprine, dextromethorphan-guaiFENesin, fentaNYL (SUBLIMAZE) injection, hydrALAZINE, LORazepam, ondansetron (ZOFRAN) IV, traMADol Anti-infectives (From admission, onward)    Start     Dose/Rate Route Frequency Ordered Stop   02/17/21 1000  remdesivir 100 mg in sodium chloride 0.9 % 100 mL IVPB       See Hyperspace for full Linked Orders Report.   100 mg 200 mL/hr over 30 Minutes Intravenous Daily 02/16/21 1632 02/21/21 0959   02/16/21 1800  remdesivir 200 mg in sodium chloride 0.9% 250 mL IVPB       See Hyperspace for full Linked Orders Report.   200 mg 580 mL/hr over 30 Minutes Intravenous Once 02/16/21 1632 02/16/21 1818   02/16/21 1215  cefTRIAXone (ROCEPHIN) 1 g in sodium chloride 0.9 % 100 mL IVPB        1 g 200 mL/hr over 30 Minutes Intravenous  Once 02/16/21 1213 02/16/21 1446   02/16/21 1215  azithromycin (ZITHROMAX) 500 mg in sodium chloride 0.9 % 250 mL IVPB        500 mg 250 mL/hr over 60 Minutes Intravenous  Once 02/16/21 1213 02/16/21 1549       Assessment: 55YOM presenting with SOB, cough and congestion. 9/7 SARSCOV2 PCR positive, on remdesivir. 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 Per chart review, patient is not on anticoagulation at home. There is noted thrombocytopenia at baseline which appears to be stable: continue to follow.  Goal of Therapy:  Heparin level 0.3-0.7 units/ml Monitor platelets by anticoagulation protocol: Yes  Plan:  Heparin level remains therapeutic This is the 3rd consecutive therapeutic level Continue heparin infusion at 2150 units/hr Recheck heparin level in am 9/10 Daily CBC while on IV heparin   Burnis Medin,  PharmD 02/18/2021 7:15 AM

## 2021-02-18 NOTE — Progress Notes (Signed)
Inpatient Diabetes Program Recommendations  AACE/ADA: New Consensus Statement on Inpatient Glycemic Control   Target Ranges:  Prepandial:   less than 140 mg/dL      Peak postprandial:   less than 180 mg/dL (1-2 hours)      Critically ill patients:  140 - 180 mg/dL  Results for Zachary Deleon, Zachary Deleon (MRN [id number]) as of 02/18/2021 08:51  Ref. Range 02/17/2021 08:40 02/17/2021 12:17 02/17/2021 16:31 02/17/2021 19:55 02/18/2021 07:45  Glucose-Capillary Latest Ref Range: 70 - 99 mg/dL 967 (H) 591 (H) 638 (H) 397 (H) 310 (H)   Results for Zachary Deleon, Zachary Deleon (MRN [id number]) as of 02/18/2021 08:51  Ref. Range 02/17/2021 08:36  Hemoglobin A1C Latest Ref Range: 4.8 - 5.6 % 10.5 (H)   Review of Glycemic Control  Diabetes history: DM2 Outpatient Diabetes medications: Toujeo 65 units QAM, Novolog 20 units TID with meals plus additional units (up to 12 units) for correction, Metformin XR 1000 mg BID Current orders for Inpatient glycemic control: Semglee 55 units QHS, Novolog 0-15 units TID with meals, Novolog 0-5 units QHS, Novolog 5 units TID with meals; Solumedrol 60 mg Q12H   Inpatient Diabetes Program Recommendations:    Insulin: If steroids are continued, please consider increasing Semglee to 65 units QHS, Novolog correction to 0-20 units TID, and meal coverage to Novolog 15 units TID with meals if patient eats at least 50% of meals.  NOTE: Called patient's room to talk with patient regarding DM. Patient's daughter answered and stated that patient's wife would be able to provide more information as she is the one that checks his glucose and gives him his medications. Called patient's wife [name]) and spoke with her about patient's DM control. [name] reports that patient is taking Toujeo 65 units QAM, Novolog 20 units TID with meals plus additional units (up to 12 units) for correction, and Metformin XR 1000 mg BID for DM control. Patient sees PCP Josephine Igo, NP) for DM management; had seen endocrinologist in the past  but patient felt PCP can manage just a well. [name] notes that patient was prescribed Trulicity by Endocrinologist  in the past and patient had severe nausea and vomiting with the medication so it was stopped. [name] reports patient's glucose ranges from 160-280 mg/dl and it has been higher lately since he has been sick. Inquired about prior A1C and she does not a specific value but notes it is usually high. Discussed current A1C 10.5% on 02/17/21 indicating an average glucose of 255 mg/dl. Discussed A1C and glucose goals. Asked that she be sure to take glucose log or glucometer to all appointments with PCP so the information can be used to help provider make adjustments with DM medications if needed. Discussed that patient is currently order Solumedrol which is a steroid and is contributing to hyperglycemia. Explained that if patient is discharged on steroids, glucose may be higher and he may need further adjustments with insulin to get glucose controlled. Encouraged [name] to reach out to patient's PCP if glucose remained elevated after he is discharged. [name] verbalized understanding of information and states she has no questions at this time.  Thanks, Orlando Penner, RN, MSN, CDE Diabetes Coordinator Inpatient Diabetes Program [phone] (Team Pager from 8am to 5pm)

## 2021-02-18 NOTE — Care Management Important Message (Signed)
Important Message  Patient Details  Name: Zachary Deleon MRN: [id number] Date of Birth: 1965/07/12   Medicare Important Message Given:  N/A - LOS <3 / Initial given by admissions  Initial Medicare IM reviewed with patient via room phone by Caprice Renshaw, Patient Access Associate on 02/17/2021 at 8:54am.     Johnell Comings 02/18/2021, 11:00 AM

## 2021-02-19 LAB — CBC
HCT: 41.2 % (ref 39.0–52.0)
Hemoglobin: 13.8 g/dL (ref 13.0–17.0)
MCH: 28.2 pg (ref 26.0–34.0)
MCHC: 33.5 g/dL (ref 30.0–36.0)
MCV: 84.3 fL (ref 80.0–100.0)
Platelets: 144 10*3/uL — ABNORMAL LOW (ref 150–400)
RBC: 4.89 MIL/uL (ref 4.22–5.81)
RDW: 14.1 % (ref 11.5–15.5)
WBC: 9.6 10*3/uL (ref 4.0–10.5)
nRBC: 0 % (ref 0.0–0.2)

## 2021-02-19 LAB — BASIC METABOLIC PANEL
Anion gap: 10 (ref 5–15)
BUN: 23 mg/dL — ABNORMAL HIGH (ref 6–20)
CO2: 25 mmol/L (ref 22–32)
Calcium: 9 mg/dL (ref 8.9–10.3)
Chloride: 98 mmol/L (ref 98–111)
Creatinine, Ser: 0.96 mg/dL (ref 0.61–1.24)
GFR, Estimated: >60 mL/min (ref >60)
Glucose, Bld: 298 mg/dL — ABNORMAL HIGH (ref 70–99)
Potassium: 4.4 mmol/L (ref 3.5–5.1)
Sodium: 133 mmol/L — ABNORMAL LOW (ref 135–145)

## 2021-02-19 LAB — GLUCOSE, CAPILLARY
Glucose-Capillary: 305 mg/dL — ABNORMAL HIGH (ref 70–99)
Glucose-Capillary: 357 mg/dL — ABNORMAL HIGH (ref 70–99)
Glucose-Capillary: 281 mg/dL — ABNORMAL HIGH (ref 70–99)
Glucose-Capillary: 432 mg/dL — ABNORMAL HIGH (ref 70–99)
Glucose-Capillary: 396 mg/dL — ABNORMAL HIGH (ref 70–99)

## 2021-02-19 MED ORDER — ALUM & MAG HYDROXIDE-SIMETH 200-200-20 MG/5ML PO SUSP
30.0000 mL | ORAL | Status: DC | PRN
  Administered 2021-02-19: 30 mL via ORAL
  Filled 2021-02-19: qty 30

## 2021-02-19 MED ORDER — INSULIN ASPART 100 UNIT/ML IJ SOLN
20.0000 [IU] | Freq: Once | INTRAMUSCULAR | Status: AC
  Administered 2021-02-19: 20 [IU] via SUBCUTANEOUS
  Filled 2021-02-19: qty 1

## 2021-02-19 MED ORDER — METHYLPREDNISOLONE SODIUM SUCC 40 MG IJ SOLR
40.0000 mg | Freq: Two times a day (BID) | INTRAMUSCULAR | Status: DC
  Administered 2021-02-20 (×2): 40 mg via INTRAVENOUS
  Filled 2021-02-19 (×2): qty 1

## 2021-02-19 NOTE — Progress Notes (Signed)
SATURATION QUALIFICATIONS: (This note is used to comply with regulatory documentation for home oxygen)  Patient Saturations on Room Air at Rest = 93%  Patient Saturations on Room Air while Ambulating = 97%  Patient Saturations on 0 Liters of oxygen while Ambulating = 97%  Please briefly explain why patient needs home oxygen: Pt did not require oxygen during testing. Demonstrated no shortness of breath or respiratory distress during ambulation.

## 2021-02-19 NOTE — Progress Notes (Signed)
OT Cancellation Note  Patient Details Name: Zachary Deleon MRN: [id number] DOB: [birth year]   Cancelled Treatment:    Reason Eval/Treat Not Completed: OT screened, no needs identified, will sign off. Order received, chart reviewed. Per conversation with PT, pt is near baseline for ADLs and presents with good family/caregiver support. No skilled acute OT needs identified. Will sign off. Please re-consult if additional needs arise.   Kathie Dike, M.S. OTR/L  02/19/21, 8:20 AM  ascom [phone]

## 2021-02-19 NOTE — Progress Notes (Signed)
PROGRESS NOTE    Zachary Deleon  WLS:[id number] DOB: 27-Jul-1965 DOA: 02/16/2021 PCP: Janyth Pupa, NP  Assessment & Plan:   Principal Problem:   Acute respiratory disease due to COVID-19 virus Active Problems:   Stroke [facility])   Type 2 diabetes mellitus without complication ([place])   Essential hypertension   Thrombocytopenia ([place])   DVT (deep venous thrombosis) (HCC)   Seizures (HCC)   Severe sepsis ([place])   Severe sepsis: met criteria w/ tachycardia, tachypnea, elevated lactic acid and COVID19 pneumonia. Continue on IV remdesivir, steroids and bronchodilators. Encourage incentive spirometry. Resolved   COVID-19 pneumonia: continue on IV remdesivir, IV steroids & bronchodilators. Start IV steroid taper. Encourage incentive spirometry   Acute hypoxic respiratory failure: likely secondary to COVID19 pneumonia. Continue on supplemental oxygen and wean as tolerated.  Oxygen desaturation will be done today    CVA: continue on statin, plavix. D/c aspirin as pt is on plavix and eliquis   DM2: likely poorly controlled. Continue on glargine, SSI w/ accuchecks   HTN: continue on amlodipine. IV hydralazine    Thrombocytopenia: labile and chronic    DVT: of LLE. Continue on eliquis. D/c pt's home dose of aspirin. High risk of bleeding w/ eliquis and plavix. Pt verbalized his understanding. Previous hx of DVTs as per pt. Sedentary lifestyle. No recent long car or plane rides   Seizure: continue on home dose of topamax. Ativan prn. Seizure precautions   Hyponatremia: labile. Will continue to monitor   DVT prophylaxis: eliquis  Code Status: DNR Family Communication: discussed pt's care w/ pt's family at bedside and answered their questions Disposition Plan: likely d/c back home   Level of care: Med-Surg  Status is: Inpatient  Remains inpatient appropriate because:IV treatments appropriate due to intensity of illness or inability to take PO and Inpatient level of care  appropriate due to severity of illness  Dispo: The patient is from: Home              Anticipated d/c is to: Home              Patient currently is not medically stable to d/c.   Difficult to place patient : unclear      Consultants:    Procedures:   Antimicrobials:   Subjective: Pt c/o shortness of breath especially w/ exertion   Objective: Vitals:   02/18/21 2005 02/19/21 0458 02/19/21 0500 02/19/21 0505  BP: (!) 170/97 (!) 159/103    Pulse: 82 70    Resp: 16 20    Temp: 98.7 F (37.1 C)  98.7 F (37.1 C)   TempSrc:   Oral   SpO2: 99% 98%  97%  Weight:      Height:       No intake or output data in the 24 hours ending 02/19/21 0825  Filed Weights   02/16/21 1055  Weight: (!) 147.4 kg    Examination:  General exam: Appears uncomfortable. Morbidly obese Respiratory system: diminished breath sounds b/l   Cardiovascular system: S1 & S2+. No rubs or clicks  Gastrointestinal system: Abd is soft, NT, obese & hypoactive bowel sounds Central nervous system: Alert and oriented. Moves all extremities Psychiatry: Judgement and insight appear normal. Flat mood and affect    Data Reviewed: I have personally reviewed following labs and imaging studies  CBC: Recent Labs  Lab 02/16/21 1055 02/17/21 0232 02/18/21 0053 02/19/21 0514  WBC 5.2 4.9 6.7 9.6  NEUTROABS  --  3.8  --   --  HGB 13.5 12.3* 12.4* 13.8  HCT 38.5* 35.4* 36.2* 41.2  MCV 84.1 83.5 85.0 84.3  PLT 120* 108* 109* 989*   Basic Metabolic Panel: Recent Labs  Lab 02/16/21 1055 02/17/21 0835 02/18/21 0053 02/19/21 0514  NA 132* 133* 132* 133*  K 4.5 4.2 4.3 4.4  CL 95* 95* 98 98  CO2 $Re'25 22 28 25  'hGf$ GLUCOSE 263* 295* 317* 298*  BUN 16 21* 24* 23*  CREATININE 1.06 1.01 1.03 0.96  CALCIUM 9.4 9.0 8.7* 9.0   GFR: Estimated Creatinine Clearance: 131.6 mL/min (by C-G formula based on SCr of 0.96 mg/dL). Liver Function Tests: No results for input(s): AST, ALT, ALKPHOS, BILITOT, PROT, ALBUMIN  in the last 168 hours. No results for input(s): LIPASE, AMYLASE in the last 168 hours. No results for input(s): AMMONIA in the last 168 hours. Coagulation Profile: Recent Labs  Lab 02/16/21 1752 02/16/21 2333  INR 1.1 1.1   Cardiac Enzymes: No results for input(s): CKTOTAL, CKMB, CKMBINDEX, TROPONINI in the last 168 hours. BNP (last 3 results) No results for input(s): PROBNP in the last 8760 hours. HbA1C: Recent Labs    02/17/21 0836  HGBA1C 10.5*   CBG: Recent Labs  Lab 02/17/21 1955 02/18/21 0745 02/18/21 1241 02/18/21 1723 02/18/21 2130  GLUCAP 397* 310* 350* 350* 321*   Lipid Profile: No results for input(s): CHOL, HDL, LDLCALC, TRIG, CHOLHDL, LDLDIRECT in the last 72 hours. Thyroid Function Tests: No results for input(s): TSH, T4TOTAL, FREET4, T3FREE, THYROIDAB in the last 72 hours. Anemia Panel: Recent Labs    02/16/21 2333  FERRITIN 214   Sepsis Labs: Recent Labs  Lab 02/16/21 1330 02/16/21 1409 02/16/21 1752 02/16/21 2333 02/17/21 0232  PROCALCITON 0.15  --   --   --   --   LATICACIDVEN  --  2.3* 3.9* 4.0* 2.4*    Recent Results (from the past 240 hour(s))  Resp Panel by RT-PCR (Flu A&B, Covid) Nasopharyngeal Swab     Status: Abnormal   Collection Time: 02/16/21 11:52 AM   Specimen: Nasopharyngeal Swab; Nasopharyngeal(NP) swabs in vial transport medium  Result Value Ref Range Status   SARS Coronavirus 2 by RT PCR POSITIVE (A) NEGATIVE Final    Comment: RESULT CALLED TO, READ BACK BY AND VERIFIED WITH: SAM HAMILTON,RN AT 2119 ON 02/16/21 BY GM (NOTE) SARS-CoV-2 target nucleic acids are DETECTED.  The SARS-CoV-2 RNA is generally detectable in upper respiratory specimens during the acute phase of infection. Positive results are indicative of the presence of the identified virus, but do not rule out bacterial infection or co-infection with other pathogens not detected by the test. Clinical correlation with patient history and other diagnostic  information is necessary to determine patient infection status. The expected result is Negative.  Fact Sheet for Patients: EntrepreneurPulse.com.au  Fact Sheet for Healthcare Providers: IncredibleEmployment.be  This test is not yet approved or cleared by the [place] FDA and  has been authorized for detection and/or diagnosis of SARS-CoV-2 by FDA under an Emergency Use Authorization (EUA).  This EUA will remain in effect (meaning this test  can be used) for the duration of  the COVID-19 declaration under Section 564(b)(1) of the Act, 21 U.S.C. section 360bbb-3(b)(1), unless the authorization is terminated or revoked sooner.     Influenza A by PCR NEGATIVE NEGATIVE Final   Influenza B by PCR NEGATIVE NEGATIVE Final    Comment: (NOTE) The Xpert Xpress SARS-CoV-2/FLU/RSV plus assay is intended as an aid in the diagnosis of influenza from  Nasopharyngeal swab specimens and should not be used as a sole basis for treatment. Nasal washings and aspirates are unacceptable for Xpert Xpress SARS-CoV-2/FLU/RSV testing.  Fact Sheet for Patients: EntrepreneurPulse.com.au  Fact Sheet for Healthcare Providers: IncredibleEmployment.be  This test is not yet approved or cleared by the [place] FDA and has been authorized for detection and/or diagnosis of SARS-CoV-2 by FDA under an Emergency Use Authorization (EUA). This EUA will remain in effect (meaning this test can be used) for the duration of the COVID-19 declaration under Section 564(b)(1) of the Act, 21 U.S.C. section 360bbb-3(b)(1), unless the authorization is terminated or revoked.  Performed at [facility], [address] [place], [place] [address]   Culture, blood (Routine X 2) w Reflex to ID Panel     Status: None (Preliminary result)   Collection Time: 02/16/21 11:33 PM   Specimen: BLOOD  Result Value Ref Range Status   Specimen  Description BLOOD RIGHT HAND  Final   Special Requests   Final    BOTTLES DRAWN AEROBIC AND ANAEROBIC Blood Culture adequate volume   Culture   Final    NO GROWTH 3 DAYS Performed at [facility], [address] [place], [place] [address]    Report Status PENDING  Incomplete  Culture, blood (Routine X 2) w Reflex to ID Panel     Status: None (Preliminary result)   Collection Time: 02/16/21 11:35 PM   Specimen: BLOOD  Result Value Ref Range Status   Specimen Description BLOOD RIGHT HAND  Final   Special Requests   Final    BOTTLES DRAWN AEROBIC AND ANAEROBIC Blood Culture adequate volume   Culture   Final    NO GROWTH 3 DAYS Performed at [facility], [address] [place], [place] [address]    Report Status PENDING  Incomplete  MRSA Next Gen by PCR, Nasal     Status: None   Collection Time: 02/17/21 12:31 AM   Specimen: Nasal Mucosa; Nasal Swab  Result Value Ref Range Status   MRSA by PCR Next Gen NOT DETECTED NOT DETECTED Final    Comment: (NOTE) The GeneXpert MRSA Assay (FDA approved for NASAL specimens only), is one component of a comprehensive MRSA colonization surveillance program. It is not intended to diagnose MRSA infection nor to guide or monitor treatment for MRSA infections. Test performance is not FDA approved in patients less than 10 years old. Performed at [facility], [address], [place], [place] [address]          Radiology Studies: No results found.      Scheduled Meds:  amLODipine  10 mg Oral Daily   apixaban  10 mg Oral BID   Followed by   Derrill Memo ON 02/25/2021] apixaban  5 mg Oral BID   atorvastatin  80 mg Oral Daily   clopidogrel  75 mg Oral Q breakfast   cyclobenzaprine  10 mg Oral BID   FLUoxetine  40 mg Oral Daily   influenza vac split quadrivalent PF  0.5 mL Intramuscular Tomorrow-1000   insulin aspart  0-20 Units Subcutaneous TID WC   insulin aspart  0-5 Units Subcutaneous QHS   insulin aspart  20  Units Subcutaneous TID WC   insulin glargine-yfgn  65 Units Subcutaneous QHS   ipratropium  2 puff Inhalation Q4H   loratadine  10 mg Oral Daily   methylPREDNISolone (SOLU-MEDROL) injection  60 mg Intravenous Q12H   mometasone-formoterol  2 puff Inhalation BID   polyethylene glycol  17 g Oral BID   pregabalin  75 mg Oral BID   senna-docusate  1 tablet Oral QHS   tamsulosin  0.4 mg Oral Daily   topiramate  25 mg Oral BID   Continuous Infusions:  remdesivir 100 mg in NS 100 mL 100 mg (02/18/21 0835)     LOS: 3 days    Time spent: 33 mins     Wyvonnia Dusky, MD [facility] Pager [phone]  02/19/2021, 8:25 AM

## 2021-02-20 LAB — CBC
HCT: 39.7 % (ref 39.0–52.0)
Hemoglobin: 13.8 g/dL (ref 13.0–17.0)
MCH: 29.1 pg (ref 26.0–34.0)
MCHC: 34.8 g/dL (ref 30.0–36.0)
MCV: 83.8 fL (ref 80.0–100.0)
Platelets: 143 10*3/uL — ABNORMAL LOW (ref 150–400)
RBC: 4.74 MIL/uL (ref 4.22–5.81)
RDW: 13.9 % (ref 11.5–15.5)
WBC: 11.1 10*3/uL — ABNORMAL HIGH (ref 4.0–10.5)
nRBC: 0 % (ref 0.0–0.2)

## 2021-02-20 LAB — BASIC METABOLIC PANEL
Anion gap: 9 (ref 5–15)
BUN: 28 mg/dL — ABNORMAL HIGH (ref 6–20)
CO2: 26 mmol/L (ref 22–32)
Calcium: 8.7 mg/dL — ABNORMAL LOW (ref 8.9–10.3)
Chloride: 98 mmol/L (ref 98–111)
Creatinine, Ser: 0.92 mg/dL (ref 0.61–1.24)
GFR, Estimated: >60 mL/min (ref >60)
Glucose, Bld: 282 mg/dL — ABNORMAL HIGH (ref 70–99)
Potassium: 4.4 mmol/L (ref 3.5–5.1)
Sodium: 133 mmol/L — ABNORMAL LOW (ref 135–145)

## 2021-02-20 LAB — GLUCOSE, CAPILLARY
Glucose-Capillary: 281 mg/dL — ABNORMAL HIGH (ref 70–99)
Glucose-Capillary: 334 mg/dL — ABNORMAL HIGH (ref 70–99)

## 2021-02-20 MED ORDER — APIXABAN 5 MG PO TABS: 5.0000 mg | ORAL_TABLET | Freq: Two times a day (BID) | ORAL | 0 refills | Status: DC

## 2021-02-20 MED ORDER — APIXABAN 5 MG PO TABS: 10.0000 mg | ORAL_TABLET | Freq: Two times a day (BID) | ORAL | 0 refills | Status: DC

## 2021-02-20 MED ORDER — PREDNISONE 20 MG PO TABS: 40.0000 mg | ORAL_TABLET | Freq: Every day | ORAL | 0 refills | Status: AC

## 2021-02-20 NOTE — TOC Transition Note (Signed)
Transition of Care [facility]) - CM/SW Discharge Note   Patient Details  Name: CRYSTAL ELLWOOD MRN: [id number] Date of Birth: 1965-11-12  Transition of Care [facility]) CM/SW Contact:  Margarito Liner, LCSW Phone Number: 02/20/2021, 2:10 PM   Clinical Narrative:   Patient has orders to discharge home today. Per RN, daughter confirmed oxygen has been set up at home. [facility] representative is aware of plan for discharge today. No further concerns. CSW signing off.  Final next level of care: Home w Home Health Services Barriers to Discharge: Barriers Resolved   Patient Goals and CMS Choice        Discharge Placement                    Patient and family notified of of transfer: 02/20/21  Discharge Plan and Services                DME Arranged: Oxygen DME Agency: [employer] Date DME Agency Contacted: 02/20/21   Representative spoke with at DME Agency: Leavy Cella HH Arranged: PT HH Agency: [facility] Date [facility] Agency Contacted: 02/20/21   Representative spoke with at [facility] Agency: Lorenza Chick  Social Determinants of Health (SDOH) Interventions     Readmission Risk Interventions Readmission Risk Prevention Plan 02/17/2021  Transportation Screening Complete  HRI or Home Care Consult Complete  Palliative Care Screening Not Applicable  Medication Review (RN Care Manager) Complete  Some recent data might be hidden

## 2021-02-20 NOTE — Plan of Care (Addendum)
  Problem: Clinical Measurements: Goal: Ability to maintain clinical measurements within normal limits will improve Outcome: Progressing Goal: Will remain free from infection Outcome: Progressing Goal: Diagnostic test results will improve Outcome: Progressing Goal: Respiratory complications will improve Outcome: Progressing Goal: Cardiovascular complication will be avoided Outcome: Progressing   Problem: Pain Managment: Goal: General experience of comfort will improve Outcome: Progressing   Pt is alert and orientedx4. V/S stable. Complained of pain on his left leg; tramadol given. Maalox given for heartburn. Placed on oxygen at 3lpm/[place] as oxygen sat dropping to low 80% while sleeping.

## 2021-02-20 NOTE — Discharge Summary (Signed)
Physician Discharge Summary  Zachary Deleon NLZ:[id number] DOB: 1965/06/16 DOA: 02/16/2021  PCP: Janyth Pupa, NP  Admit date: 02/16/2021 Discharge date: 02/20/2021  Admitted From: home Disposition:  home w/ home health   Recommendations for Outpatient Follow-up:  Follow up with PCP in 1-2 weeks   Home Health: yes Equipment/Devices: 3L [place] at night only   Discharge Condition: stable  CODE STATUS: full  Diet recommendation: Heart Healthy / Carb Modified   Brief/Interim Summary: HPI was taken from Trainer: Zachary Deleon is a 55 y.o. 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   Patient states that she started having dry cough and shortness breath since last night which has been progressively worsening.  Patient states that he had some pressure-like chest pain earlier, which has resolved.  Patient has mild fever and chills at home, his temperature is 99.7 in ED.  Patient does not have nausea, vomiting, diarrhea or abdominal pain.  No symptoms of UTI. Pt states that all family members are positive for [place] recently.  Patient states that he had hx of DVT in left leg, and he has worsening pain in left leg today.     ED Course: pt was found to have positive COVID PCR, WBC 5.2, troponin level 15, lactic acid 2.1, 2.3, renal function okay, temperature 99.7, blood pressure 132/77, heart rate 103, 99, RR 29, oxygen saturation 89% on room air, which decreased to lower 80s on exertion.  Currently on 2 L oxygen with 96% saturation. Chest x-ray with patchy bilateral infiltration.  CTA is a limited study, but negative for PE. LE venous doppler showed chronic-appearing, nonocclusive thrombus limited to the left distal femoral vein and popliteal vein. Pt is admitted to  [place] bed as inpatient.   CTA of chest: 1. Despite the limitations of today's examination, there is no evidence to suggest clinically significant central, lobar or segmental sized pulmonary embolism. 2. Aortic atherosclerosis, in addition to left main and 3 vessel coronary artery disease. Please note that although the presence of coronary artery calcium documents the presence of coronary artery disease, the severity of this disease and any potential stenosis cannot be assessed on this non-gated CT examination. Assessment for potential risk factor modification, dietary therapy or pharmacologic therapy may be warranted, if clinically indicated. 3. Mild diffuse bronchial wall thickening with mild centrilobular and paraseptal emphysema; imaging findings suggestive of underlying COPD. 4. There are calcifications of the aortic valve. Echocardiographic correlation for evaluation of potential valvular dysfunction may be warranted if clinically indicated. 5. Hepatic steatosis.   Hospital course from Dr. Jimmye Norman 9/8-9/11/22: Pt was found to have severe sepsis secondary to COVID19 pneumonia. Pt was treated w/ IV remdesivir, IV steroids, bronchodilators, incentive spirometry and supplemental oxygen. Pt was able to be weaned off of supplemental oxygen at rest and exertion but required supplemental oxygen while sleeping as pt's O2 sats dropped into the low 80s. Pt completed the course of remdesivir inpatient and was sent home w/ po steroids to complete the course. PT evaluated pt and recommended HH. HH was set up by CM prior to d/c. Also, pt was found to have LLE DVT and was initially put on IV heparin drip and was switched to po eliquis. Pt's home dose of aspirin was d/c. For more information, please see previous progress notes.   Discharge Diagnoses:  Principal  Problem:   Acute respiratory disease due to COVID-19 virus Active Problems:   Stroke [facility])   Type 2 diabetes mellitus without  complication (HCC)   Essential hypertension   Thrombocytopenia (HCC)   DVT (deep venous thrombosis) (HCC)   Seizures (HCC)   Severe sepsis (HCC)  Severe sepsis: met criteria w/ tachycardia, tachypnea, elevated lactic acid and COVID19 pneumonia. Continue on IV remdesivir, steroids and bronchodilators. Encourage incentive spirometry. Resolved   COVID-19 pneumonia: completed remdesivir course. Continue on steroids & bronchodilators. Encourage incentive spirometry   Acute hypoxic respiratory failure: likely secondary to COVID19 pneumonia. Needs 3 L [place] at night w/ sleeping only    CVA: continue on statin, plavix. D/c aspirin as pt is on plavix and eliquis   DM2: likely poorly controlled. Continue on glargine, SSI w/ accuchecks   HTN: continue on amlodipine. IV hydralazine    Thrombocytopenia: labile and chronic    DVT: of LLE. Continue on eliquis. D/c pt's home dose of aspirin. High risk of bleeding w/ eliquis and plavix. Pt verbalized his understanding. No NSAIDs. Previous hx of DVTs as per pt. Sedentary lifestyle. No recent long car or plane rides   Seizure: continue on home dose of topamax. Ativan prn. Seizure precautions   Hyponatremia: labile. Will continue to monitor   Morbid obesity: BMI 41.7. Complicates overall care and prognosis   Discharge Instructions  Discharge Instructions     Diet - low sodium heart healthy   Complete by: As directed    Diet Carb Modified   Complete by: As directed    Discharge instructions   Complete by: As directed    F/u w/ PCP in 1-2 weeks   Increase activity slowly   Complete by: As directed       Allergies as of 02/20/2021       Reactions   Morphine And Related Other (See Comments)   Makes pt hyper   Trulicity [dulaglutide] Nausea And Vomiting        Medication List     STOP taking these medications    aspirin 81 MG EC tablet   gabapentin 100 MG capsule Commonly known as: NEURONTIN       TAKE these medications     acetaminophen 325 MG tablet Commonly known as: TYLENOL Take 2 tablets (650 mg total) by mouth every 4 (four) hours as needed for mild pain (or temp > 37.5 C (99.5 F)).   albuterol 108 (90 Base) MCG/ACT inhaler Commonly known as: Ventolin HFA Inhale 2 puffs into the lungs every 4 (four) hours as needed for wheezing or shortness of breath.   amLODipine 10 MG tablet Commonly known as: NORVASC Take 1 tablet (10 mg total) by mouth daily.   apixaban 5 MG Tabs tablet Commonly known as: ELIQUIS Take 2 tablets (10 mg total) by mouth 2 (two) times daily for 5 days.   apixaban 5 MG Tabs tablet Commonly known as: ELIQUIS Take 1 tablet (5 mg total) by mouth 2 (two) times daily. Only start taking this script after you have completed eliquis 10 mg BID x 5 days Start taking on: February 25, 2021   atorvastatin 80 MG tablet Commonly known as: LIPITOR Take 1 tablet (80 mg total) by mouth daily.   clopidogrel 75 MG tablet Commonly known as: PLAVIX Take 1 tablet (75 mg total) by mouth daily with breakfast.   cyclobenzaprine 10 MG tablet Commonly known as: FLEXERIL Take 10 mg by mouth 2 (two) times daily as needed.   diclofenac sodium  1 % Gel Commonly known as: VOLTAREN Apply 2 g topically 4 (four) times daily.   fenofibrate 160 MG tablet Take 1 tablet (160 mg total) by mouth daily.   fluconazole 150 MG tablet Commonly known as: DIFLUCAN   FLUoxetine 40 MG capsule Commonly known as: PROZAC Take 40 mg by mouth daily.   Fluticasone-Salmeterol 250-50 MCG/DOSE Aepb Commonly known as: ADVAIR Inhale 1 puff into the lungs 2 (two) times daily.   insulin aspart 100 UNIT/ML FlexPen Commonly known as: NOVOLOG INJECT 20 UNITS UNDER THE SKIN THREE TIMES DAILY BEFORE MEALS PLUS SLIDING SCALE UP TO 80 UNITS EVERY DAY   ketoconazole 2 % shampoo Commonly known as: NIZORAL LATHER ON WET BEARD. LEAVE ON FOR 3-5 MINUTES AND RINSE. APPLY TWICE WEEKLY FOR 2-4 WEEKS   lidocaine 5 % Commonly  known as: LIDODERM Place 1 patch onto the skin daily. Remove & Discard patch within 12 hours or as directed by MD   loratadine 10 MG tablet Commonly known as: CLARITIN Take 1 tablet by mouth daily.   losartan 50 MG tablet Commonly known as: COZAAR Take 75 mg by mouth daily.   metFORMIN 500 MG 24 hr tablet Commonly known as: GLUCOPHAGE-XR Take 1,000 mg by mouth 2 (two) times daily.   methocarbamol 500 MG tablet Commonly known as: ROBAXIN Take 1 tablet (500 mg total) by mouth every 8 (eight) hours as needed for muscle spasms.   nicotine 14 mg/24hr patch Commonly known as: NICODERM CQ - dosed in mg/24 hours 14 mg patch daily x3 weeks then 7 mg patch daily x3 weeks and stop   nystatin powder Commonly known as: MYCOSTATIN/NYSTOP Apply topically.   predniSONE 20 MG tablet Commonly known as: DELTASONE Take 2 tablets (40 mg total) by mouth daily for 5 days.   pregabalin 75 MG capsule Commonly known as: LYRICA Take 75 mg by mouth 2 (two) times daily.   QUEtiapine 25 MG tablet Commonly known as: SEROQUEL Take 0.5 tablets (12.5 mg total) by mouth 2 (two) times daily as needed (agitation).   senna-docusate 8.6-50 MG tablet Commonly known as: Senokot-S Take 1 tablet by mouth at bedtime.   tamsulosin 0.4 MG Caps capsule Commonly known as: FLOMAX Take 1 capsule by mouth daily.   tiotropium 18 MCG inhalation capsule Commonly known as: SPIRIVA Place 18 mcg into inhaler and inhale daily.   topiramate 25 MG tablet Commonly known as: TOPAMAX Take 1 tablet (25 mg total) by mouth 2 (two) times daily.   Toujeo SoloStar 300 UNIT/ML Solostar Pen Generic drug: insulin glargine (1 Unit Dial) Inject into the skin.   traMADol 50 MG tablet Commonly known as: ULTRAM Take 0.5-1 tablets (25-50 mg total) by mouth every 6 (six) hours as needed for moderate pain or severe pain.               Durable Medical Equipment  (From admission, onward)           Start     Ordered    02/20/21 0955  For home use only DME oxygen  Once       Comments: Pt's oxygen level drops into the low 80s while sleeping  Question Answer Comment  Length of Need 12 Months   Liters per Minute 3   Frequency Only at night (stationary unit needed)   Oxygen conserving device Yes   Oxygen delivery system Gas      02/20/21 0956            Allergies  Allergen  Reactions   Morphine And Related Other (See Comments)    Makes pt hyper   Trulicity [Dulaglutide] Nausea And Vomiting    Consultations:    Procedures/Studies: DG Chest 2 View  Result Date: 02/16/2021 CLINICAL DATA:  Cough, congestion shortness of breath. EXAM: CHEST - 2 VIEW COMPARISON:  03/29/2018 FINDINGS: The cardiac silhouette, mediastinal and hilar contours are within limits and stable. Patchy interstitial infiltrates could suggest COVID pneumonia. No focal airspace consolidation or pulmonary lesions. IMPRESSION: Patchy interstitial infiltrates could suggest COVID pneumonia. Electronically Signed   By: Marijo Sanes M.D.   On: 02/16/2021 11:53   CT Angio Chest PE W and/or Wo Contrast  Result Date: 02/16/2021 CLINICAL DATA:  55 year old male with history of shortness of breath, cough and congestion. COVID positive patient. Evaluate for pulmonary embolism. EXAM: CT ANGIOGRAPHY CHEST WITH CONTRAST TECHNIQUE: Multidetector CT imaging of the chest was performed using the standard protocol during bolus administration of intravenous contrast. Multiplanar CT image reconstructions and MIPs were obtained to evaluate the vascular anatomy. CONTRAST:  70mL OMNIPAQUE IOHEXOL 350 MG/ML SOLN COMPARISON:  No priors. FINDINGS: Comment: Study is limited for detection of pulmonary embolism by suboptimal contrast bolus, and excessive image noise from patient's large body habitus. Cardiovascular: Within the limitations of today's examination, there are no central, lobar or segmental sized filling defects to suggest clinically significant pulmonary  embolism. Smaller subsegmental sized emboli cannot be entirely excluded secondary to the limitations of today's examination. Heart size is borderline enlarged. There is no significant pericardial fluid, thickening or pericardial calcification. There is aortic atherosclerosis, as well as atherosclerosis of the great vessels of the mediastinum and the coronary arteries, including calcified atherosclerotic plaque in the left main, left anterior descending, left circumflex and right coronary arteries. Calcifications of the aortic valve. Mediastinum/Nodes: No pathologically enlarged mediastinal or hilar lymph nodes. Esophagus is unremarkable in appearance. No axillary lymphadenopathy. Lungs/Pleura: Elevation of the right hemidiaphragm. Some associated passive subsegmental atelectasis and/or scarring in the base of the right lung. No acute consolidative airspace disease. No pleural effusions. No definite suspicious appearing pulmonary nodules or masses are noted. Mild diffuse bronchial wall thickening with mild centrilobular and paraseptal emphysema. Upper Abdomen: Diffuse low attenuation throughout the visualized hepatic parenchyma, indicative of a background of hepatic steatosis. Musculoskeletal: There are no aggressive appearing lytic or blastic lesions noted in the visualized portions of the skeleton. Review of the MIP images confirms the above findings. IMPRESSION: 1. Despite the limitations of today's examination, there is no evidence to suggest clinically significant central, lobar or segmental sized pulmonary embolism. 2. Aortic atherosclerosis, in addition to left main and 3 vessel coronary artery disease. Please note that although the presence of coronary artery calcium documents the presence of coronary artery disease, the severity of this disease and any potential stenosis cannot be assessed on this non-gated CT examination. Assessment for potential risk factor modification, dietary therapy or pharmacologic  therapy may be warranted, if clinically indicated. 3. Mild diffuse bronchial wall thickening with mild centrilobular and paraseptal emphysema; imaging findings suggestive of underlying COPD. 4. There are calcifications of the aortic valve. Echocardiographic correlation for evaluation of potential valvular dysfunction may be warranted if clinically indicated. 5. Hepatic steatosis. Aortic Atherosclerosis (ICD10-I70.0) and Emphysema (ICD10-J43.9). Electronically Signed   By: Vinnie Langton M.D.   On: 02/16/2021 14:31   US Venous Img Lower Unilateral Left  Result Date: 02/16/2021 CLINICAL DATA:  Calf pain EXAM: LEFT LOWER EXTREMITY VENOUS DOPPLER ULTRASOUND TECHNIQUE: Gray-scale sonography with graded compression, as well  as color Doppler and duplex ultrasound were performed to evaluate the lower extremity deep venous systems from the level of the common femoral vein and including the common femoral, femoral, profunda femoral, popliteal and calf veins including the posterior tibial, peroneal and gastrocnemius veins when visible. The superficial great saphenous vein was also interrogated. Spectral Doppler was utilized to evaluate flow at rest and with distal augmentation maneuvers in the common femoral, femoral and popliteal veins. COMPARISON:  None. FINDINGS: Contralateral Common Femoral Vein: Respiratory phasicity is normal and symmetric with the symptomatic side. No evidence of thrombus. Normal compressibility. Common Femoral Vein: No evidence of thrombus. Normal compressibility, respiratory phasicity and response to augmentation. Saphenofemoral Junction: No evidence of thrombus. Normal compressibility and flow on color Doppler imaging. Profunda Femoral Vein: No evidence of thrombus. Normal compressibility and flow on color Doppler imaging. Femoral Vein: In the distal femoral vein, there is nonocclusive wall adherent thrombus. Popliteal Vein: In the femoral vein, there is nonocclusive wall adherent thrombus. Calf  Veins: No evidence of thrombus. Normal compressibility and flow on color Doppler imaging. Other Findings:  None. 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 Electronically Signed   By: Albin Felling M.D.   On: 02/16/2021 12:43   (Echo, Carotid, EGD, Colonoscopy, ERCP)    Subjective: Pt c/o fatigue   Discharge Exam: Vitals:   02/20/21 0446 02/20/21 0835  BP:  (!) 149/102  Pulse:  84  Resp:  20  Temp:  98.9 F (37.2 C)  SpO2: 95% 98%   Vitals:   02/20/21 0347 02/20/21 0445 02/20/21 0446 02/20/21 0835  BP: (!) 143/89   (!) 149/102  Pulse: 77   84  Resp: 18   20  Temp: 98.2 F (36.8 C)   98.9 F (37.2 C)  TempSrc: Oral   Oral  SpO2: 97% (!) 86% 95% 98%  Weight:      Height:        General: Pt is alert, awake, not in acute distress Cardiovascular: S1/S2 +, no rubs, no gallops Respiratory: diminished breath sounds b/l otherwise clear  Abdominal: Soft, NT, obese, bowel sounds + Extremities:  no cyanosis    The results of significant diagnostics from this hospitalization (including imaging, microbiology, ancillary and laboratory) are listed below for reference.     Microbiology: Recent Results (from the past 240 hour(s))  Resp Panel by RT-PCR (Flu A&B, Covid) Nasopharyngeal Swab     Status: Abnormal   Collection Time: 02/16/21 11:52 AM   Specimen: Nasopharyngeal Swab; Nasopharyngeal(NP) swabs in vial transport medium  Result Value Ref Range Status   SARS Coronavirus 2 by RT PCR POSITIVE (A) NEGATIVE Final    Comment: RESULT CALLED TO, READ BACK BY AND VERIFIED WITH: SAM HAMILTON,RN AT 3557 ON 02/16/21 BY GM (NOTE) SARS-CoV-2 target nucleic acids are DETECTED.  The SARS-CoV-2 RNA is generally detectable in upper respiratory specimens during the acute phase of infection. Positive results are indicative of the presence of the identified virus, but do not rule out bacterial infection or  co-infection with other pathogens not detected by the test. Clinical correlation with patient history and other diagnostic information is necessary to determine patient infection status. The expected result is Negative.  Fact Sheet for Patients: EntrepreneurPulse.com.au  Fact Sheet for Healthcare Providers: IncredibleEmployment.be  This test is not yet approved or cleared by the [place] FDA and  has been authorized for detection and/or diagnosis of SARS-CoV-2 by FDA under an Emergency Use  Authorization (EUA).  This EUA will remain in effect (meaning this test  can be used) for the duration of  the COVID-19 declaration under Section 564(b)(1) of the Act, 21 U.S.C. section 360bbb-3(b)(1), unless the authorization is terminated or revoked sooner.     Influenza A by PCR NEGATIVE NEGATIVE Final   Influenza B by PCR NEGATIVE NEGATIVE Final    Comment: (NOTE) The Xpert Xpress SARS-CoV-2/FLU/RSV plus assay is intended as an aid in the diagnosis of influenza from Nasopharyngeal swab specimens and should not be used as a sole basis for treatment. Nasal washings and aspirates are unacceptable for Xpert Xpress SARS-CoV-2/FLU/RSV testing.  Fact Sheet for Patients: EntrepreneurPulse.com.au  Fact Sheet for Healthcare Providers: IncredibleEmployment.be  This test is not yet approved or cleared by the [place] FDA and has been authorized for detection and/or diagnosis of SARS-CoV-2 by FDA under an Emergency Use Authorization (EUA). This EUA will remain in effect (meaning this test can be used) for the duration of the COVID-19 declaration under Section 564(b)(1) of the Act, 21 U.S.C. section 360bbb-3(b)(1), unless the authorization is terminated or revoked.  Performed at [facility], [address] [place], [place] [address]   Culture, blood (Routine X 2) w Reflex to ID Panel     Status: None  (Preliminary result)   Collection Time: 02/16/21 11:33 PM   Specimen: BLOOD  Result Value Ref Range Status   Specimen Description BLOOD RIGHT HAND  Final   Special Requests   Final    BOTTLES DRAWN AEROBIC AND ANAEROBIC Blood Culture adequate volume   Culture   Final    NO GROWTH 4 DAYS Performed at [facility], [address], [place], [place] [address]    Report Status PENDING  Incomplete  Culture, blood (Routine X 2) w Reflex to ID Panel     Status: None (Preliminary result)   Collection Time: 02/16/21 11:35 PM   Specimen: BLOOD  Result Value Ref Range Status   Specimen Description BLOOD RIGHT HAND  Final   Special Requests   Final    BOTTLES DRAWN AEROBIC AND ANAEROBIC Blood Culture adequate volume   Culture   Final    NO GROWTH 4 DAYS Performed at [facility], [address] [place], [place] [address]    Report Status PENDING  Incomplete  MRSA Next Gen by PCR, Nasal     Status: None   Collection Time: 02/17/21 12:31 AM   Specimen: Nasal Mucosa; Nasal Swab  Result Value Ref Range Status   MRSA by PCR Next Gen NOT DETECTED NOT DETECTED Final    Comment: (NOTE) The GeneXpert MRSA Assay (FDA approved for NASAL specimens only), is one component of a comprehensive MRSA colonization surveillance program. It is not intended to diagnose MRSA infection nor to guide or monitor treatment for MRSA infections. Test performance is not FDA approved in patients less than 74 years old. Performed at [facility], [address] [place], [place] [address]      Labs: BNP (last 3 results) No results for input(s): BNP in the last 8760 hours. Basic Metabolic Panel: Recent Labs  Lab 02/16/21 1055 02/17/21 0835 02/18/21 0053 02/19/21 0514 02/20/21 0450  NA 132* 133* 132* 133* 133*  K 4.5 4.2 4.3 4.4 4.4  CL 95* 95* 98 98 98  CO2 $Re'25 22 28 25 26  'ppc$ GLUCOSE 263* 295* 317* 298* 282*  BUN 16 21* 24* 23* 28*  CREATININE 1.06 1.01 1.03 0.96 0.92  CALCIUM  9.4 9.0 8.7* 9.0 8.7*   Liver Function Tests: No results for input(s): AST, ALT, ALKPHOS, BILITOT, PROT, ALBUMIN in the last 168 hours. No results for input(s): LIPASE, AMYLASE in the last 168 hours. No results for input(s): AMMONIA in the last 168 hours. CBC: Recent Labs  Lab 02/16/21 1055 02/17/21 0232 02/18/21 0053 02/19/21 0514 02/20/21 0450  WBC 5.2 4.9 6.7 9.6 11.1*  NEUTROABS  --  3.8  --   --   --   HGB 13.5 12.3* 12.4* 13.8 13.8  HCT 38.5* 35.4* 36.2* 41.2 39.7  MCV 84.1 83.5 85.0 84.3 83.8  PLT 120* 108* 109* 144* 143*   Cardiac Enzymes: No results for input(s): CKTOTAL, CKMB, CKMBINDEX, TROPONINI in the last 168 hours. BNP: Invalid input(s): POCBNP CBG: Recent Labs  Lab 02/19/21 1700 02/19/21 2049 02/19/21 2316 02/20/21 0832 02/20/21 1217  GLUCAP 281* 432* 396* 281* 334*   D-Dimer No results for input(s): DDIMER in the last 72 hours. Hgb A1c No results for input(s): HGBA1C in the last 72 hours. Lipid Profile No results for input(s): CHOL, HDL, LDLCALC, TRIG, CHOLHDL, LDLDIRECT in the last 72 hours. Thyroid function studies No results for input(s): TSH, T4TOTAL, T3FREE, THYROIDAB in the last 72 hours.  Invalid input(s): FREET3 Anemia work up No results for input(s): VITAMINB12, FOLATE, FERRITIN, TIBC, IRON, RETICCTPCT in the last 72 hours. Urinalysis    Component Value Date/Time   COLORURINE YELLOW 12/02/2017 2054   APPEARANCEUR CLEAR 12/02/2017 2054   LABSPEC 1.028 12/02/2017 2054   PHURINE 5.0 12/02/2017 2054   GLUCOSEU >=500 (A) 12/02/2017 2054   HGBUR NEGATIVE 12/02/2017 2054   BILIRUBINUR NEGATIVE 12/02/2017 2054   [place] NEGATIVE 12/02/2017 2054   PROTEINUR 100 (A) 12/02/2017 2054   NITRITE NEGATIVE 12/02/2017 2054   LEUKOCYTESUR NEGATIVE 12/02/2017 2054   Sepsis Labs Invalid input(s): PROCALCITONIN,  WBC,  LACTICIDVEN Microbiology Recent Results (from the past 240 hour(s))  Resp Panel by RT-PCR (Flu A&B, Covid) Nasopharyngeal Swab      Status: Abnormal   Collection Time: 02/16/21 11:52 AM   Specimen: Nasopharyngeal Swab; Nasopharyngeal(NP) swabs in vial transport medium  Result Value Ref Range Status   SARS Coronavirus 2 by RT PCR POSITIVE (A) NEGATIVE Final    Comment: RESULT CALLED TO, READ BACK BY AND VERIFIED WITH: SAM HAMILTON,RN AT 0623 ON 02/16/21 BY GM (NOTE) SARS-CoV-2 target nucleic acids are DETECTED.  The SARS-CoV-2 RNA is generally detectable in upper respiratory specimens during the acute phase of infection. Positive results are indicative of the presence of the identified virus, but do not rule out bacterial infection or co-infection with other pathogens not detected by the test. Clinical correlation with patient history and other diagnostic information is necessary to determine patient infection status. The expected result is Negative.  Fact Sheet for Patients: EntrepreneurPulse.com.au  Fact Sheet for Healthcare Providers: IncredibleEmployment.be  This test is not yet approved or cleared by the [place] FDA and  has been authorized for detection and/or diagnosis of SARS-CoV-2 by FDA under an Emergency Use Authorization (EUA).  This EUA will remain in effect (meaning this test  can be used) for the duration of  the COVID-19 declaration under Section 564(b)(1) of the Act, 21 U.S.C. section 360bbb-3(b)(1), unless the authorization is terminated or revoked sooner.     Influenza A by PCR NEGATIVE NEGATIVE Final   Influenza B by PCR NEGATIVE NEGATIVE Final    Comment: (NOTE) The Xpert Xpress SARS-CoV-2/FLU/RSV plus assay is intended as an aid in the diagnosis  of influenza from Nasopharyngeal swab specimens and should not be used as a sole basis for treatment. Nasal washings and aspirates are unacceptable for Xpert Xpress SARS-CoV-2/FLU/RSV testing.  Fact Sheet for Patients: EntrepreneurPulse.com.au  Fact Sheet for Healthcare  Providers: IncredibleEmployment.be  This test is not yet approved or cleared by the [place] FDA and has been authorized for detection and/or diagnosis of SARS-CoV-2 by FDA under an Emergency Use Authorization (EUA). This EUA will remain in effect (meaning this test can be used) for the duration of the COVID-19 declaration under Section 564(b)(1) of the Act, 21 U.S.C. section 360bbb-3(b)(1), unless the authorization is terminated or revoked.  Performed at [facility], [address] [place], [place] [address]   Culture, blood (Routine X 2) w Reflex to ID Panel     Status: None (Preliminary result)   Collection Time: 02/16/21 11:33 PM   Specimen: BLOOD  Result Value Ref Range Status   Specimen Description BLOOD RIGHT HAND  Final   Special Requests   Final    BOTTLES DRAWN AEROBIC AND ANAEROBIC Blood Culture adequate volume   Culture   Final    NO GROWTH 4 DAYS Performed at [facility], [address], [place], [place] [address]    Report Status PENDING  Incomplete  Culture, blood (Routine X 2) w Reflex to ID Panel     Status: None (Preliminary result)   Collection Time: 02/16/21 11:35 PM   Specimen: BLOOD  Result Value Ref Range Status   Specimen Description BLOOD RIGHT HAND  Final   Special Requests   Final    BOTTLES DRAWN AEROBIC AND ANAEROBIC Blood Culture adequate volume   Culture   Final    NO GROWTH 4 DAYS Performed at [facility], [address], [place], [place] [address]    Report Status PENDING  Incomplete  MRSA Next Gen by PCR, Nasal     Status: None   Collection Time: 02/17/21 12:31 AM   Specimen: Nasal Mucosa; Nasal Swab  Result Value Ref Range Status   MRSA by PCR Next Gen NOT DETECTED NOT DETECTED Final    Comment: (NOTE) The GeneXpert MRSA Assay (FDA approved for NASAL specimens only), is one component of a comprehensive MRSA colonization surveillance program. It is not intended to diagnose MRSA  infection nor to guide or monitor treatment for MRSA infections. Test performance is not FDA approved in patients less than 71 years old. Performed at [facility], [address], [place], [place] [address]      Time coordinating discharge: Over 30 minutes  SIGNED:   Wyvonnia Dusky, MD  [facility] 02/20/2021, 2:05 PM Pager   If 7PM-7AM, please contact night-coverage

## 2021-02-20 NOTE — TOC Progression Note (Addendum)
Transition of Care [facility]) - Progression Note    Patient Details  Name: Zachary Deleon MRN: [id number] Date of Birth: 12-24-1965  [facility]) CM/SW Contact  Margarito Liner, LCSW Phone Number: 02/20/2021, 9:45 AM  Clinical Narrative:  Per MD, patient does not need daytime oxygen but dropped into 80's at night. According to flowsheet patient dropped to 86% on room air. Back up to 95% on 3 L. Per [employer] representative, do not need sats note for nighttime oxygen. Just need DME order with explanation for need in comments section.   10:32 am: Ordered oxygen through [employer].  Expected Discharge Plan: Home w Home Health Services Barriers to Discharge: Continued Medical Work up  Expected Discharge Plan and Services Expected Discharge Plan: Home w Home Health Services       Living arrangements for the past 2 months: Single Family Home                                       Social Determinants of Health (SDOH) Interventions    Readmission Risk Interventions Readmission Risk Prevention Plan 02/17/2021  Transportation Screening Complete  HRI or Home Care Consult Complete  Palliative Care Screening Not Applicable  Medication Review (RN Care Manager) Complete  Some recent data might be hidden

## 2021-02-20 NOTE — Progress Notes (Addendum)
Pt discharged to home. Discharge instructions given with daughter at bedside. No concerns voiced. Pt reminded to stop by pharmacy and pick up meds that were e-prescribed by Provider. Verbalized understanding. Left unit in wheelchair pushed by hospital Transport. Left in stable condition.

## 2021-02-21 LAB — CULTURE, BLOOD (ROUTINE X 2)
Culture: NO GROWTH
Culture: NO GROWTH
Special Requests: ADEQUATE
Special Requests: ADEQUATE

## 2021-02-26 ENCOUNTER — Emergency Department (HOSPITAL_COMMUNITY): Payer: Medicare HMO

## 2021-02-26 ENCOUNTER — Other Ambulatory Visit: Payer: Self-pay

## 2021-02-26 ENCOUNTER — Emergency Department (HOSPITAL_COMMUNITY)
Admission: EM | Admit: 2021-02-26 | Discharge: 2021-02-27 | Disposition: A | Discharge status: Home / Self Care | Payer: Medicare HMO | Attending: Student | Admitting: Student

## 2021-02-26 ENCOUNTER — Encounter (HOSPITAL_COMMUNITY): Payer: Self-pay | Admitting: Emergency Medicine

## 2021-02-26 DIAGNOSIS — R0602 Shortness of breath: Secondary | ICD-10-CM | POA: Diagnosis present

## 2021-02-26 DIAGNOSIS — Z87891 Personal history of nicotine dependence: Secondary | ICD-10-CM | POA: Diagnosis not present

## 2021-02-26 DIAGNOSIS — E1169 Type 2 diabetes mellitus with other specified complication: Secondary | ICD-10-CM | POA: Insufficient documentation

## 2021-02-26 DIAGNOSIS — Z7901 Long term (current) use of anticoagulants: Secondary | ICD-10-CM | POA: Insufficient documentation

## 2021-02-26 DIAGNOSIS — R509 Fever, unspecified: Secondary | ICD-10-CM | POA: Diagnosis not present

## 2021-02-26 DIAGNOSIS — Z79899 Other long term (current) drug therapy: Secondary | ICD-10-CM | POA: Diagnosis not present

## 2021-02-26 DIAGNOSIS — Z794 Long term (current) use of insulin: Secondary | ICD-10-CM | POA: Diagnosis not present

## 2021-02-26 DIAGNOSIS — R0902 Hypoxemia: Secondary | ICD-10-CM

## 2021-02-26 DIAGNOSIS — R000 Tachycardia, unspecified: Secondary | ICD-10-CM | POA: Diagnosis not present

## 2021-02-26 DIAGNOSIS — J449 Chronic obstructive pulmonary disease, unspecified: Secondary | ICD-10-CM | POA: Diagnosis not present

## 2021-02-26 DIAGNOSIS — U071 COVID-19: Secondary | ICD-10-CM

## 2021-02-26 DIAGNOSIS — Z7902 Long term (current) use of antithrombotics/antiplatelets: Secondary | ICD-10-CM | POA: Diagnosis not present

## 2021-02-26 DIAGNOSIS — Z7951 Long term (current) use of inhaled steroids: Secondary | ICD-10-CM | POA: Diagnosis not present

## 2021-02-26 DIAGNOSIS — Z8616 Personal history of COVID-19: Secondary | ICD-10-CM | POA: Insufficient documentation

## 2021-02-26 DIAGNOSIS — Z7984 Long term (current) use of oral hypoglycemic drugs: Secondary | ICD-10-CM | POA: Diagnosis not present

## 2021-02-26 DIAGNOSIS — I10 Essential (primary) hypertension: Secondary | ICD-10-CM | POA: Diagnosis not present

## 2021-02-26 DIAGNOSIS — E871 Hypo-osmolality and hyponatremia: Secondary | ICD-10-CM | POA: Insufficient documentation

## 2021-02-26 LAB — COMPREHENSIVE METABOLIC PANEL
ALT: 58 U/L — ABNORMAL HIGH (ref 0–44)
AST: 55 U/L — ABNORMAL HIGH (ref 15–41)
Albumin: 3.7 g/dL (ref 3.5–5.0)
Alkaline Phosphatase: 75 U/L (ref 38–126)
Anion gap: 12 (ref 5–15)
BUN: 20 mg/dL (ref 6–20)
CO2: 22 mmol/L (ref 22–32)
Calcium: 8.6 mg/dL — ABNORMAL LOW (ref 8.9–10.3)
Chloride: 93 mmol/L — ABNORMAL LOW (ref 98–111)
Creatinine, Ser: 1.09 mg/dL (ref 0.61–1.24)
GFR, Estimated: >60 mL/min (ref >60)
Glucose, Bld: 206 mg/dL — ABNORMAL HIGH (ref 70–99)
Potassium: 4.5 mmol/L (ref 3.5–5.1)
Sodium: 127 mmol/L — ABNORMAL LOW (ref 135–145)
Total Bilirubin: 1 mg/dL (ref 0.3–1.2)
Total Protein: 7 g/dL (ref 6.5–8.1)

## 2021-02-26 LAB — CBC WITH DIFFERENTIAL/PLATELET
Abs Immature Granulocytes: 0.14 10*3/uL — ABNORMAL HIGH (ref 0.00–0.07)
Basophils Absolute: 0 10*3/uL (ref 0.0–0.1)
Basophils Relative: 0 %
Eosinophils Absolute: 0 10*3/uL (ref 0.0–0.5)
Eosinophils Relative: 0 %
HCT: 41.2 % (ref 39.0–52.0)
Hemoglobin: 13.9 g/dL (ref 13.0–17.0)
Immature Granulocytes: 1 %
Lymphocytes Relative: 14 %
Lymphs Abs: 1.4 10*3/uL (ref 0.7–4.0)
MCH: 28.7 pg (ref 26.0–34.0)
MCHC: 33.7 g/dL (ref 30.0–36.0)
MCV: 84.9 fL (ref 80.0–100.0)
Monocytes Absolute: 1.1 10*3/uL — ABNORMAL HIGH (ref 0.1–1.0)
Monocytes Relative: 11 %
Neutro Abs: 7.4 10*3/uL (ref 1.7–7.7)
Neutrophils Relative %: 74 %
Platelets: 119 10*3/uL — ABNORMAL LOW (ref 150–400)
RBC: 4.85 MIL/uL (ref 4.22–5.81)
RDW: 14.5 % (ref 11.5–15.5)
WBC: 10.1 10*3/uL (ref 4.0–10.5)
nRBC: 0 % (ref 0.0–0.2)

## 2021-02-26 LAB — TROPONIN I (HIGH SENSITIVITY): Troponin I (High Sensitivity): 15 ng/L (ref <18)

## 2021-02-26 MED ORDER — IOHEXOL 350 MG/ML SOLN
80.0000 mL | Freq: Once | INTRAVENOUS | Status: AC | PRN
  Administered 2021-02-26: 80 mL via INTRAVENOUS

## 2021-02-26 NOTE — ED Provider Notes (Signed)
[facility] EMERGENCY DEPARTMENT Provider Note   CSN: [id number] Arrival date & time: 02/26/21  2205     History Chief Complaint  Patient presents with   Shortness of Breath    Zachary Deleon is a 55 y.o. male with PMH COPD, DVT on Eliquis, seizure disorder, previous CVA, recent admission and discharge from [facility] for COVID-19 and pneumonia who presents emergency department for evaluation of shortness of breath.  Patient states that shortness of breath worsened over the last 24 hours and he has felt persistently weak with 2 falls.  He has been wearing home O2 approximately 2 to 3 L nasal cannula after hospitalization and arrives saturating in the low 90s.  S gave the patient 125 mg of Solu-Medrol and 1 DuoNeb en route but the patient has no wheezing on arrival to the ER today.  Currently denies chest pain, abdominal pain, nausea, vomiting or other systemic symptoms.   Shortness of Breath Associated symptoms: fever   Associated symptoms: no abdominal pain, no chest pain, no cough, no ear pain, no rash, no sore throat and no vomiting       Past Medical History:  Diagnosis Date   Arthritis    Carotid stenosis    COPD (chronic obstructive pulmonary disease) (HCC)    Depression    Diabetes mellitus without complication (HCC)    DVT (deep venous thrombosis) (HCC)    Hypertension    OSA (obstructive sleep apnea)    Restrictive lung disease    Seizures (HCC)    Status post carotid endarterectomy    with patch angioplasty   Stroke (HCC) 11/2017    Patient Active Problem List   Diagnosis Date Noted   Acute respiratory disease due to COVID-19 virus 02/16/2021   DVT (deep venous thrombosis) (HCC)    Seizures (HCC)    Severe sepsis (HCC)    Thrombocytopenia (HCC)    Hypomagnesemia    Hyponatremia    Diabetes mellitus type 2 in obese (HCC)    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   Left hemiparesis (HCC)    Morbid obesity (HCC)    Essential  hypertension    Stroke (HCC) 12/02/2017   Type 2 diabetes mellitus without complication (HCC) 12/02/2017    Past Surgical History:  Procedure Laterality Date   BRAIN SURGERY     BRAIN TUMOR EXCISION     performed at [facility]   CATARACT EXTRACTION         Family History  Problem Relation Age of Onset   Diabetes Mellitus II Father    Hypertension Father    Hypertension Brother     Social History   Tobacco Use   Smoking status: Former   Smokeless tobacco: Never  Substance Use Topics   Alcohol use: Not Currently   Drug use: Not Currently    Home Medications Prior to Admission medications   Medication Sig Start Date End Date Taking? Authorizing Provider  acetaminophen (TYLENOL) 325 MG tablet Take 2 tablets (650 mg total) by mouth every 4 (four) hours as needed for mild pain (or temp > 37.5 C (99.5 F)). 12/20/17   Angiulli, Mcarthur Rossetti, PA-C  albuterol (VENTOLIN HFA) 108 (90 Base) MCG/ACT inhaler Inhale 2 puffs into the lungs every 4 (four) hours as needed for wheezing or shortness of breath. 12/20/17   Angiulli, Mcarthur Rossetti, PA-C  amLODipine (NORVASC) 10 MG tablet Take 1 tablet (10 mg total) by mouth daily. 12/21/17   Angiulli, Mcarthur Rossetti, PA-C  apixaban (ELIQUIS) 5 MG TABS tablet Take 2 tablets (10 mg total) by mouth 2 (two) times daily for 5 days. 02/20/21 02/25/21  Charise Killian, MD  apixaban (ELIQUIS) 5 MG TABS tablet Take 1 tablet (5 mg total) by mouth 2 (two) times daily. Only start taking this script after you have completed eliquis 10 mg BID x 5 days 02/25/21 03/27/21  Charise Killian, MD  atorvastatin (LIPITOR) 80 MG tablet Take 1 tablet (80 mg total) by mouth daily. 12/20/17   Angiulli, Mcarthur Rossetti, PA-C  clopidogrel (PLAVIX) 75 MG tablet Take 1 tablet (75 mg total) by mouth daily with breakfast. 12/20/17   Angiulli, Mcarthur Rossetti, PA-C  cyclobenzaprine (FLEXERIL) 10 MG tablet Take 10 mg by mouth 2 (two) times daily as needed. 02/04/21   [provider]  diclofenac sodium  (VOLTAREN) 1 % GEL Apply 2 g topically 4 (four) times daily. 12/20/17   Angiulli, Mcarthur Rossetti, PA-C  fenofibrate 160 MG tablet Take 1 tablet (160 mg total) by mouth daily. Patient not taking: No sig reported 12/21/17   Angiulli, Mcarthur Rossetti, PA-C  fluconazole (DIFLUCAN) 150 MG tablet  02/12/20   [provider]  FLUoxetine (PROZAC) 40 MG capsule Take 40 mg by mouth daily. 01/17/21   [provider]  Fluticasone-Salmeterol (ADVAIR) 250-50 MCG/DOSE AEPB Inhale 1 puff into the lungs 2 (two) times daily. 12/20/17   Angiulli, Mcarthur Rossetti, PA-C  insulin aspart (NOVOLOG) 100 UNIT/ML FlexPen INJECT 20 UNITS UNDER THE SKIN THREE TIMES DAILY BEFORE MEALS PLUS SLIDING SCALE UP TO 80 UNITS EVERY DAY 04/12/20   [provider]  ketoconazole (NIZORAL) 2 % shampoo LATHER ON WET BEARD. LEAVE ON FOR 3-5 MINUTES AND RINSE. APPLY TWICE WEEKLY FOR 2-4 WEEKS 01/14/21   [provider]  lidocaine (LIDODERM) 5 % Place 1 patch onto the skin daily. Remove & Discard patch within 12 hours or as directed by MD 12/21/17   Angiulli, Mcarthur Rossetti, PA-C  loratadine (CLARITIN) 10 MG tablet Take 1 tablet by mouth daily. 01/12/21   [provider]  losartan (COZAAR) 50 MG tablet Take 75 mg by mouth daily. 12/27/20   [provider]  metFORMIN (GLUCOPHAGE-XR) 500 MG 24 hr tablet Take 1,000 mg by mouth 2 (two) times daily. 01/13/21   [provider]  methocarbamol (ROBAXIN) 500 MG tablet Take 1 tablet (500 mg total) by mouth every 8 (eight) hours as needed for muscle spasms. 12/20/17   Angiulli, Mcarthur Rossetti, PA-C  nicotine (NICODERM CQ - DOSED IN MG/24 HOURS) 14 mg/24hr patch 14 mg patch daily x3 weeks then 7 mg patch daily x3 weeks and stop 12/20/17   Angiulli, Mcarthur Rossetti, PA-C  nystatin (MYCOSTATIN/NYSTOP) powder Apply topically. 02/09/21   [provider]  pregabalin (LYRICA) 75 MG capsule Take 75 mg by mouth 2 (two) times daily. 02/10/21   [provider]  QUEtiapine (SEROQUEL) 25 MG tablet  Take 0.5 tablets (12.5 mg total) by mouth 2 (two) times daily as needed (agitation). 12/20/17   Angiulli, Mcarthur Rossetti, PA-C  senna-docusate (SENOKOT-S) 8.6-50 MG tablet Take 1 tablet by mouth at bedtime. 12/06/17   Johnson, Clanford L, MD  tamsulosin (FLOMAX) 0.4 MG CAPS capsule Take 1 capsule by mouth daily. 10/08/20   [provider]  tiotropium (SPIRIVA) 18 MCG inhalation capsule Place 18 mcg into inhaler and inhale daily.    [provider]  topiramate (TOPAMAX) 25 MG tablet Take 1 tablet (25 mg total) by mouth 2 (two) times daily. 12/20/17  Angiulli, Mcarthur Rossetti, PA-C  TOUJEO SOLOSTAR 300 UNIT/ML Solostar Pen Inject into the skin. 01/14/21   [provider]  traMADol (ULTRAM) 50 MG tablet Take 0.5-1 tablets (25-50 mg total) by mouth every 6 (six) hours as needed for moderate pain or severe pain. 12/20/17   Angiulli, Mcarthur Rossetti, PA-C    Allergies    Morphine and related and Trulicity [dulaglutide]  Review of Systems   Review of Systems  Constitutional:  Positive for fatigue and fever. Negative for chills.  HENT:  Negative for ear pain and sore throat.   Eyes:  Negative for pain and visual disturbance.  Respiratory:  Positive for shortness of breath. Negative for cough.   Cardiovascular:  Negative for chest pain and palpitations.  Gastrointestinal:  Negative for abdominal pain and vomiting.  Genitourinary:  Negative for dysuria and hematuria.  Musculoskeletal:  Negative for arthralgias and back pain.  Skin:  Negative for color change and rash.  Neurological:  Negative for seizures and syncope.  All other systems reviewed and are negative.  Physical Exam Updated Vital Signs BP (!) 125/46   Pulse 95   Temp (!) 100.5 F (38.1 C) (Oral)   Resp 18   Ht 6\' 2"  (1.88 m)   Wt (!) 147 kg   SpO2 95%   BMI 41.61 kg/m   Physical Exam Vitals and nursing note reviewed.  Constitutional:      Appearance: He is well-developed.  HENT:     Head: Normocephalic and atraumatic.   Eyes:     Conjunctiva/sclera: Conjunctivae normal.  Cardiovascular:     Rate and Rhythm: Normal rate and regular rhythm.     Heart sounds: No murmur heard. Pulmonary:     Effort: Pulmonary effort is normal. No respiratory distress.     Breath sounds: Normal breath sounds.  Abdominal:     Palpations: Abdomen is soft.     Tenderness: There is no abdominal tenderness.  Musculoskeletal:     Cervical back: Neck supple.  Skin:    General: Skin is warm and dry.  Neurological:     Mental Status: He is alert.    ED Results / Procedures / Treatments   Labs (all labs ordered are listed, but only abnormal results are displayed) Labs Reviewed  COMPREHENSIVE METABOLIC PANEL - Abnormal; Notable for the following components:      Result Value   Sodium 127 (*)    Chloride 93 (*)    Glucose, Bld 206 (*)    Calcium 8.6 (*)    AST 55 (*)    ALT 58 (*)    All other components within normal limits  CBC WITH DIFFERENTIAL/PLATELET - Abnormal; Notable for the following components:   Platelets 119 (*)    Monocytes Absolute 1.1 (*)    Abs Immature Granulocytes 0.14 (*)    All other components within normal limits  TROPONIN I (HIGH SENSITIVITY)  TROPONIN I (HIGH SENSITIVITY)    EKG EKG Interpretation  Date/Time:  Saturday February 26 2021 22:12:47 EDT Ventricular Rate:  101 PR Interval:  155 QRS Duration: 92 QT Interval:  326 QTC Calculation: 423 R Axis:   4 Text Interpretation: Sinus tachycardia Ventricular premature complex Low voltage, precordial leads Consider anterior infarct Confirmed by 10-09-1995 (Ross Marcus) on 02/26/2021 11:19:37 PM  Radiology CT Angio Chest PE W and/or Wo Contrast  Result Date: 02/27/2021 CLINICAL DATA:  Dyspnea, fever, known DVT.  History of COVID. EXAM: CT ANGIOGRAPHY CHEST WITH CONTRAST TECHNIQUE: Multidetector CT imaging of the  chest was performed using the standard protocol during bolus administration of intravenous contrast. Multiplanar CT image  reconstructions and MIPs were obtained to evaluate the vascular anatomy. CONTRAST:  71mL OMNIPAQUE IOHEXOL 350 MG/ML SOLN COMPARISON:  CT angiogram chest 02/16/2021. FINDINGS: Cardiovascular: The heart and aorta are normal in size. There is no pericardial effusion. Coronary artery calcifications are present. Opacification of the pulmonary arteries is adequate to the segmental level. There is no evidence for pulmonary embolism. Mediastinum/Nodes: No enlarged mediastinal, hilar, or axillary lymph nodes. Thyroid gland, trachea, and esophagus demonstrate no significant findings. Lungs/Pleura: There are mild-to-moderate emphysematous changes in the lung apices, similar to the prior study. There are new patchy multifocal ground-glass opacities throughout both lungs both centrally and peripherally. These findings are nonspecific, but can be seen with COVID infection. There is no pleural effusion or pneumothorax. Upper Abdomen: No acute abnormality. Musculoskeletal: No chest wall abnormality. No acute or significant osseous findings. Review of the MIP images confirms the above findings. IMPRESSION: 1. There is no evidence for pulmonary embolism to the segmental level. Smaller subsegmental pulmonary emboli cannot be excluded secondary to timing of the contrast bolus. 2. Scattered multifocal ground-glass and interstitial opacities bilaterally. Findings are nonspecific, but can be seen with COVID infection. Please correlate clinically. 3.  Emphysema (ICD10-J43.9). Electronically Signed   By: Darliss Cheney M.D.   On: 02/27/2021 00:01   DG Chest Portable 1 View  Result Date: 02/26/2021 CLINICAL DATA:  Dyspnea EXAM: PORTABLE CHEST 1 VIEW COMPARISON:  02/16/2021 FINDINGS: Pulmonary vascular congestion without overt edema. Normal cardiomediastinal contours and pleural spaces. No focal airspace consolidation. IMPRESSION: Pulmonary vascular congestion without overt edema. Electronically Signed   By: Deatra Robinson M.D.   On:  02/26/2021 23:14    Procedures Procedures   Medications Ordered in ED Medications  iohexol (OMNIPAQUE) 350 MG/ML injection 80 mL (80 mLs Intravenous Contrast Given 02/26/21 2330)    ED Course  I have reviewed the triage vital signs and the nursing notes.  Pertinent labs & imaging results that were available during my care of the patient were reviewed by me and considered in my medical decision making (see chart for details).    MDM Rules/Calculators/A&P                           Patient seen the emergency department for evaluation of shortness of breath.  Physical exam is largely unremarkable.  Laboratory evaluation reveals hyponatremia 127, AST elevated to 55, ALT 58, CBC unremarkable, initial high-sensitivity troponin 15.  X-ray reveals pulmonary vascular congestion.  CT PE is currently pending.  Patient signed out to oncoming provider.  Please see provider signout note for continuation of work-up.  Anticipate admission. Final Clinical Impression(s) / ED Diagnoses Final diagnoses:  None    Rx / DC Orders ED Discharge Orders     None        Leena Tiede, Wyn Forster, MD 02/27/21 [phone]

## 2021-02-26 NOTE — ED Notes (Signed)
Patient transported to CT 

## 2021-02-26 NOTE — ED Triage Notes (Signed)
Pt here via [facility] for c/o increased SOB x last 24hrs. Also c/o increased generalized weakness with 2 falls over past 24hrs. Pt recently discharged from [facility] after tx for Covid PNA. Pt on O2 2-3L [place] post hospitalization. EMS states O2 sats 90's. Pt given 1 Duoneb en route and 125mg  Solumedrol IV. Pt had temp of 100.7 so EMS gave 1gm Tylenol. T also received 4mg  Zofran IV for motion sickness.

## 2021-02-27 LAB — TROPONIN I (HIGH SENSITIVITY): Troponin I (High Sensitivity): 13 ng/L (ref <18)

## 2021-02-27 MED ORDER — FUROSEMIDE 10 MG/ML IJ SOLN
20.0000 mg | Freq: Once | INTRAMUSCULAR | Status: AC
  Administered 2021-02-27: 20 mg via INTRAVENOUS
  Filled 2021-02-27: qty 2

## 2021-02-27 MED ORDER — FUROSEMIDE 20 MG PO TABS: 20.0000 mg | ORAL_TABLET | Freq: Every day | ORAL | 0 refills | Status: DC

## 2021-02-27 NOTE — ED Provider Notes (Signed)
Patient signed out pending CT scan and reassessment.  In brief, patient was admitted for COVID-19 pneumonia.  He was discharged with oxygen.  Per the patient's wife, he was supposed to use as needed during the day and at night; however, has been using it fairly continuously.  Was noted to have O2 sats in the low 80s and confusion earlier yesterday.  Noted by EMS to be in the 90s.  He is on Eliquis for DVT and has a history of COPD as well.  No known history of heart failure.  Lab work notable for hyponatremia at 127.  Baseline is in the low 130s.  He also has slightly elevated AST and ALT.  Troponin x2 reassuring.  X-ray concerning for some mild vascular congestion.  CT scan does not show any evidence of PE but does show changes consistent with COVID-19.  I suspect his shortness of breath is multifactorial.  2:29 AM On my repeat evaluation, he has been weaned off of his oxygen.  He is satting 92%.  He is in no overt respiratory distress.  He does appear slightly volume overloaded.  Discussed with him the options.  He does have oxygen at home.  I suspect that COVID-19 has damaged his lungs and this in commendation with COPD and potentially some slight volume overload is worsening his symptoms.  I gave him a small dose of Lasix 20 mg IV.  We will need to avoid high doses given his hyponatremia.  He was able to ambulate around the room and maintain his pulse ox.  He was noted to be slightly winded.  Discussed course of action with the patient.  He would like to try to go home if at all possible.  He was monitored and on final recheck, satting 92% on room air while sleeping.  No respiratory distress.  No active wheezing.  Discussed with the patient and his wife that he will need to follow-up with his primary doctor in several days for recheck of his sodium.  I will give him 2 more doses of 20 mg of IV Lasix.  Recommend that he wear his oxygen.  Follow-up with physician as needed.  After history, exam, and medical  workup I feel the patient has been appropriately medically screened and is safe for discharge home. Pertinent diagnoses were discussed with the patient. Patient was given return precautions.  CRITICAL CARE Performed by: Shon Baton   Total critical care time: 45 minutes  Critical care time was exclusive of separately billable procedures and treating other patients.  Critical care was necessary to treat or prevent imminent or life-threatening deterioration.  Critical care was time spent personally by me on the following activities: development of treatment plan with patient and/or surrogate as well as nursing, discussions with consultants, evaluation of patient's response to treatment, examination of patient, obtaining history from patient or surrogate, ordering and performing treatments and interventions, ordering and review of laboratory studies, ordering and review of radiographic studies, pulse oximetry and re-evaluation of patient's condition.    Physical Exam  BP 130/88   Pulse 92   Temp (!) 100.5 F (38.1 C) (Oral)   Resp (!) 25   Ht 1.88 m (6\' 2" )   Wt (!) 147 kg   SpO2 94%   BMI 41.61 kg/m   Physical Exam Vitals and nursing note reviewed.  Constitutional:      Appearance: He is well-developed. He is obese.  HENT:     Head: Normocephalic and atraumatic.  Mouth/Throat:     Mouth: Mucous membranes are moist.  Eyes:     Pupils: Pupils are equal, round, and reactive to light.  Cardiovascular:     Rate and Rhythm: Normal rate and regular rhythm.     Heart sounds: No murmur heard. Pulmonary:     Effort: Pulmonary effort is normal. Tachypnea present. No respiratory distress.     Breath sounds: Decreased breath sounds present. No wheezing.  Abdominal:     General: Bowel sounds are normal.     Palpations: Abdomen is soft.     Tenderness: There is no abdominal tenderness. There is no rebound.  Musculoskeletal:     Right lower leg: Edema present.     Left lower  leg: Edema present.  Skin:    General: Skin is warm and dry.  Neurological:     Mental Status: He is alert and oriented to person, place, and time.  Psychiatric:        Mood and Affect: Mood normal.    ED Course/Procedures     Procedures  MDM   Problem List Items Addressed This Visit       Other   Hyponatremia   Other Visit Diagnoses     Shortness of breath    -  Primary   COVID-19       Hypoxia                 Shon Baton, MD 02/27/21 [phone]

## 2021-02-27 NOTE — ED Notes (Signed)
Ambulated Pt in room on Pulse Ox. Pt made 1.5 trips across the room before developing dyspnea. Pt remained on Room air upon returning to bed and O2 Sats maintained between 92%-95%. EDP Notified.

## 2021-02-27 NOTE — ED Notes (Signed)
ED Provider at bedside. 

## 2021-02-27 NOTE — Discharge Instructions (Addendum)
You were seen today for shortness of breath.  This is likely multifactorial.  Your lungs have sustained damage from COVID-19.  This is evident on your CT scan.  Use your oxygen at home to prevent hypoxia.  You did have evidence of some mild volume overload.  You will be given 2 additional days of a fluid pill.  Because your sodium is slightly low, you need to follow-up closely with your primary doctor for recheck of your sodium early this week.  If you develop worsening shortness of breath or any new or worsening symptoms, you should be reevaluated.

## 2022-05-30 ENCOUNTER — Telehealth: Payer: Self-pay | Admitting: Urgent Care

## 2022-05-30 ENCOUNTER — Ambulatory Visit
Admission: EM | Admit: 2022-05-30 | Discharge: 2022-05-30 | Disposition: A | Discharge status: Home / Self Care | Payer: Medicare HMO | Attending: Urgent Care | Admitting: Urgent Care

## 2022-05-30 DIAGNOSIS — Z8673 Personal history of transient ischemic attack (TIA), and cerebral infarction without residual deficits: Secondary | ICD-10-CM | POA: Insufficient documentation

## 2022-05-30 DIAGNOSIS — U071 COVID-19: Secondary | ICD-10-CM

## 2022-05-30 DIAGNOSIS — R0981 Nasal congestion: Secondary | ICD-10-CM | POA: Insufficient documentation

## 2022-05-30 DIAGNOSIS — Z87891 Personal history of nicotine dependence: Secondary | ICD-10-CM | POA: Insufficient documentation

## 2022-05-30 DIAGNOSIS — R079 Chest pain, unspecified: Secondary | ICD-10-CM | POA: Insufficient documentation

## 2022-05-30 DIAGNOSIS — R051 Acute cough: Secondary | ICD-10-CM | POA: Insufficient documentation

## 2022-05-30 DIAGNOSIS — Z8616 Personal history of COVID-19: Secondary | ICD-10-CM | POA: Diagnosis not present

## 2022-05-30 DIAGNOSIS — Z794 Long term (current) use of insulin: Secondary | ICD-10-CM | POA: Diagnosis not present

## 2022-05-30 DIAGNOSIS — E119 Type 2 diabetes mellitus without complications: Secondary | ICD-10-CM | POA: Diagnosis not present

## 2022-05-30 DIAGNOSIS — J449 Chronic obstructive pulmonary disease, unspecified: Secondary | ICD-10-CM | POA: Insufficient documentation

## 2022-05-30 DIAGNOSIS — Z7984 Long term (current) use of oral hypoglycemic drugs: Secondary | ICD-10-CM | POA: Insufficient documentation

## 2022-05-30 DIAGNOSIS — Z1152 Encounter for screening for COVID-19: Secondary | ICD-10-CM | POA: Diagnosis not present

## 2022-05-30 LAB — RESP PANEL BY RT-PCR (RSV, FLU A&B, COVID)  RVPGX2
Influenza A by PCR: NEGATIVE
Influenza B by PCR: NEGATIVE
Resp Syncytial Virus by PCR: NEGATIVE
SARS Coronavirus 2 by RT PCR: NEGATIVE

## 2022-05-30 MED ORDER — LEVOFLOXACIN 750 MG PO TABS
750.0000 mg | ORAL_TABLET | Freq: Every day | ORAL | 0 refills | Status: AC
Start: 2022-05-30 — End: 2022-06-06

## 2022-05-30 MED ORDER — MOLNUPIRAVIR EUA 200MG CAPSULE
4.0000 | ORAL_CAPSULE | Freq: Two times a day (BID) | ORAL | 0 refills | Status: AC
Start: 2022-05-30 — End: 2022-06-04

## 2022-05-30 MED ORDER — BENZONATATE 100 MG PO CAPS: ORAL_CAPSULE | ORAL | 0 refills | Status: DC

## 2022-05-30 NOTE — Telephone Encounter (Signed)
Entered in error

## 2022-05-30 NOTE — Discharge Instructions (Addendum)
Schedule a follow-up visit with your primary care provider in about 1 week to assess your recovery from today's illness and determine if you need additional treatment or assessment.

## 2022-05-30 NOTE — Telephone Encounter (Signed)
Patient positive for COVID-19.  Contact patient by telephone to inform that I was prescribing antiviral.  He is not eligible for paxlovid because of use of apixaban.

## 2022-05-30 NOTE — ED Provider Notes (Addendum)
Zachary Deleon    CSN: [id number] Arrival date & time: 05/30/22  1258      History   Chief Complaint Chief Complaint  Patient presents with   Cough   Nasal Congestion    HPI Zachary Deleon is a 56 y.o. male.    Cough   He is accompanied by his wife "mama".  He presents to urgent care with complaint of productive cough and congestion since yesterday.  He states that cough produces pain in his chest.  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 (taking apixaban)  Past Medical History:  Diagnosis Date   Arthritis    Carotid stenosis    COPD (chronic obstructive pulmonary disease) (HCC)    Depression    Diabetes mellitus without complication (HCC)    DVT (deep venous thrombosis) (HCC)    Hypertension    OSA (obstructive sleep apnea)    Restrictive lung disease    Seizures (HCC)    Status post carotid endarterectomy    with patch angioplasty   Stroke (HCC) 11/2017    Patient Active Problem List   Diagnosis Date Noted   Acute respiratory disease due to COVID-19 virus 02/16/2021   DVT (deep venous thrombosis) (HCC)    Seizures (HCC)    Severe sepsis (HCC)    Thrombocytopenia (HCC)    Hypomagnesemia    Hyponatremia    Diabetes mellitus type 2 in obese (HCC)    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   Left hemiparesis (HCC)    Morbid obesity (HCC)    Essential hypertension    Stroke (HCC) 12/02/2017   Type 2 diabetes mellitus without complication (HCC) 12/02/2017    Past Surgical History:  Procedure Laterality Date   BRAIN SURGERY     BRAIN TUMOR EXCISION     performed at [facility]   CATARACT EXTRACTION         Home Medications    Prior to Admission medications   Medication Sig Start Date End Date Taking? Authorizing Provider  ACCU-CHEK GUIDE test strip  02/18/22  Yes [provider]  famotidine (PEPCID) 20 MG tablet TAKE 1  TABLET TWICE DAILY AS NEEDED FOR HEARTBURN 08/02/21  Yes [provider]  acetaminophen (TYLENOL) 325 MG tablet Take 2 tablets (650 mg total) by mouth every 4 (four) hours as needed for mild pain (or temp > 37.5 C (99.5 F)). 12/20/17   Angiulli, Mcarthur Rossetti, PA-C  albuterol (VENTOLIN HFA) 108 (90 Base) MCG/ACT inhaler Inhale 2 puffs into the lungs every 4 (four) hours as needed for wheezing or shortness of breath. 12/20/17   Angiulli, Mcarthur Rossetti, PA-C  amLODipine (NORVASC) 10 MG tablet Take 1 tablet (10 mg total) by mouth daily. 12/21/17   Angiulli, Mcarthur Rossetti, PA-C  apixaban (ELIQUIS) 5 MG TABS tablet Take 2 tablets (10 mg total) by mouth 2 (two) times daily for 5 days. 02/20/21 02/25/21  Charise Killian, MD  apixaban (ELIQUIS) 5 MG TABS tablet Take 1 tablet (5 mg total) by mouth 2 (two) times daily. Only start taking this script after you have completed eliquis 10 mg BID x 5 days 02/25/21 03/27/21  Charise Killian, MD  atorvastatin (LIPITOR) 80 MG tablet Take 1 tablet (80 mg total) by mouth daily. 12/20/17   Angiulli, Mcarthur Rossetti, PA-C  clopidogrel (PLAVIX) 75 MG tablet Take 1 tablet (75 mg total) by mouth daily with breakfast.  12/20/17   Angiulli, Mcarthur Rossettianiel J, PA-C  cyclobenzaprine (FLEXERIL) 10 MG tablet Take 10 mg by mouth 2 (two) times daily as needed. 02/04/21   [provider]  diclofenac sodium (VOLTAREN) 1 % GEL Apply 2 g topically 4 (four) times daily. 12/20/17   Angiulli, Mcarthur Rossettianiel J, PA-C  fenofibrate 160 MG tablet Take 1 tablet (160 mg total) by mouth daily. Patient not taking: No sig reported 12/21/17   Angiulli, Mcarthur Rossettianiel J, PA-C  fluconazole (DIFLUCAN) 150 MG tablet  02/12/20   [provider]  FLUoxetine (PROZAC) 40 MG capsule Take 40 mg by mouth daily. 01/17/21   [provider]  Fluticasone-Salmeterol (ADVAIR) 250-50 MCG/DOSE AEPB Inhale 1 puff into the lungs 2 (two) times daily. 12/20/17   Angiulli, Mcarthur Rossettianiel J, PA-C  furosemide (LASIX) 20 MG tablet Take 1 tablet (20 mg  total) by mouth daily. 02/27/21   Horton, Mayer Maskerourtney F, MD  insulin aspart (NOVOLOG) 100 UNIT/ML FlexPen INJECT 20 UNITS UNDER THE SKIN THREE TIMES DAILY BEFORE MEALS PLUS SLIDING SCALE UP TO 80 UNITS EVERY DAY 04/12/20   [provider]  ketoconazole (NIZORAL) 2 % shampoo LATHER ON WET BEARD. LEAVE ON FOR 3-5 MINUTES AND RINSE. APPLY TWICE WEEKLY FOR 2-4 WEEKS 01/14/21   [provider]  lidocaine (LIDODERM) 5 % Place 1 patch onto the skin daily. Remove & Discard patch within 12 hours or as directed by MD 12/21/17   Angiulli, Mcarthur Rossettianiel J, PA-C  loratadine (CLARITIN) 10 MG tablet Take 1 tablet by mouth daily. 01/12/21   [provider]  losartan (COZAAR) 50 MG tablet Take 75 mg by mouth daily. 12/27/20   [provider]  metFORMIN (GLUCOPHAGE-XR) 500 MG 24 hr tablet Take 1,000 mg by mouth 2 (two) times daily. 01/13/21   [provider]  methocarbamol (ROBAXIN) 500 MG tablet Take 1 tablet (500 mg total) by mouth every 8 (eight) hours as needed for muscle spasms. 12/20/17   Angiulli, Mcarthur Rossettianiel J, PA-C  nicotine (NICODERM CQ - DOSED IN MG/24 HOURS) 14 mg/24hr patch 14 mg patch daily x3 weeks then 7 mg patch daily x3 weeks and stop 12/20/17   Angiulli, Mcarthur Rossettianiel J, PA-C  nystatin (MYCOSTATIN/NYSTOP) powder Apply topically. 02/09/21   [provider]  pantoprazole (PROTONIX) 40 MG tablet Take 40 mg by mouth daily.    [provider]  pregabalin (LYRICA) 75 MG capsule Take 75 mg by mouth 2 (two) times daily. 02/10/21   [provider]  QUEtiapine (SEROQUEL) 25 MG tablet Take 0.5 tablets (12.5 mg total) by mouth 2 (two) times daily as needed (agitation). 12/20/17   Angiulli, Mcarthur Rossettianiel J, PA-C  senna-docusate (SENOKOT-S) 8.6-50 MG tablet Take 1 tablet by mouth at bedtime. 12/06/17   Johnson, Clanford L, MD  tamsulosin (FLOMAX) 0.4 MG CAPS capsule Take 1 capsule by mouth daily. 10/08/20   [provider]  tiotropium (SPIRIVA) 18 MCG inhalation capsule Place 18  mcg into inhaler and inhale daily.    [provider]  topiramate (TOPAMAX) 25 MG tablet Take 1 tablet (25 mg total) by mouth 2 (two) times daily. 12/20/17   Angiulli, Mcarthur Rossettianiel J, PA-C  TOUJEO SOLOSTAR 300 UNIT/ML Solostar Pen Inject into the skin. 01/14/21   [provider]  traMADol (ULTRAM) 50 MG tablet Take 0.5-1 tablets (25-50 mg total) by mouth every 6 (six) hours as needed for moderate pain or severe pain. 12/20/17   Angiulli, Mcarthur Rossettianiel J, PA-C    Family History Family History  Problem Relation Age of Onset  Diabetes Mellitus II Father    Hypertension Father    Hypertension Brother     Social History Social History   Tobacco Use   Smoking status: Former   Smokeless tobacco: Never  Substance Use Topics   Alcohol use: Not Currently   Drug use: Not Currently     Allergies   Morphine and related and Trulicity [dulaglutide]   Review of Systems Review of Systems  Respiratory:  Positive for cough.      Physical Exam Triage Vital Signs ED Triage Vitals  Enc Vitals Group     BP 05/30/22 1320 (!) 148/81     Pulse Rate 05/30/22 1320 75     Resp 05/30/22 1320 17     Temp 05/30/22 1320 98.1 F (36.7 C)     Temp src --      SpO2 05/30/22 1320 93 %     Weight --      Height --      Head Circumference --      Peak Flow --      Pain Score 05/30/22 1321 2     Pain Loc --      Pain Edu? --      Excl. in GC? --    No data found.  Updated Vital Signs BP (!) 148/81   Pulse 75   Temp 98.1 F (36.7 C)   Resp 17   SpO2 93% Comment: Pt. on PRN O2  Visual Acuity Right Eye Distance:   Left Eye Distance:   Bilateral Distance:    Right Eye Near:   Left Eye Near:    Bilateral Near:     Physical Exam Vitals reviewed.  Constitutional:      Appearance: Normal appearance. He is not ill-appearing.  Cardiovascular:     Rate and Rhythm: Normal rate and regular rhythm.     Pulses: Normal pulses.     Heart sounds: Normal heart sounds.  Pulmonary:      Effort: Pulmonary effort is normal.     Breath sounds: Normal breath sounds. No wheezing or rhonchi.  Skin:    General: Skin is warm and dry.  Neurological:     General: No focal deficit present.     Mental Status: He is alert and oriented to person, place, and time.  Psychiatric:        Mood and Affect: Mood normal.        Behavior: Behavior normal.      UC Treatments / Results  Labs (all labs ordered are listed, but only abnormal results are displayed) Labs Reviewed - No data to display  EKG   Radiology No results found.  Procedures Procedures (including critical care time)  Medications Ordered in UC Medications - No data to display  Initial Impression / Assessment and Plan / UC Course  I have reviewed the triage vital signs and the nursing notes.  Pertinent labs & imaging results that were available during my care of the patient were reviewed by me and considered in my medical decision making (see chart for details).   Patient is afebrile here without recent antipyretics. Satting well on room air. Overall is well appearing, well hydrated, without respiratory distress. Pulmonary exam is unremarkable except for harsh dry cough worsened by deep breathing.  CTAB without wheezes, rhonchi, rales.  Suspect viral process including influenza, COVID, RSV and concern for acute exacerbation of COPD.   RSV included in respiratory swab given patient history of COPD and chronic vascular disease  presenting with symptoms suggestive of moderate disease.  Patient is at high risk for poor outcome.    Corticosteroid is contraindicated given his history of type 2 diabetes.  Will treat with levofloxacin given COPD and ask him to follow-up with his primary care provider in 1 week.  Final Clinical Impressions(s) / UC Diagnoses   Final diagnoses:  None   Discharge Instructions   None    ED Prescriptions   None    PDMP not reviewed this encounter.   Charma Igo, FNP 05/30/22  1338    ImmordinoJeannett Senior, FNP 05/30/22 1341

## 2022-05-30 NOTE — ED Triage Notes (Signed)
Pt. Presents to UC w/ c/o a productive cough that produces chest pain and congestion since yesterday.

## 2022-11-24 DIAGNOSIS — Z720 Tobacco use: Secondary | ICD-10-CM | POA: Insufficient documentation

## 2022-12-13 ENCOUNTER — Other Ambulatory Visit: Payer: Self-pay

## 2022-12-13 ENCOUNTER — Encounter: Payer: Self-pay | Admitting: Emergency Medicine

## 2022-12-13 ENCOUNTER — Emergency Department: Payer: Medicare HMO

## 2022-12-13 ENCOUNTER — Emergency Department
Admission: EM | Admit: 2022-12-13 | Discharge: 2022-12-13 | Disposition: A | Discharge status: Home / Self Care | Payer: Medicare HMO | Attending: Emergency Medicine | Admitting: Emergency Medicine

## 2022-12-13 DIAGNOSIS — M25511 Pain in right shoulder: Secondary | ICD-10-CM | POA: Insufficient documentation

## 2022-12-13 DIAGNOSIS — E119 Type 2 diabetes mellitus without complications: Secondary | ICD-10-CM | POA: Diagnosis not present

## 2022-12-13 DIAGNOSIS — W19XXXA Unspecified fall, initial encounter: Secondary | ICD-10-CM

## 2022-12-13 DIAGNOSIS — S0993XA Unspecified injury of face, initial encounter: Secondary | ICD-10-CM | POA: Diagnosis present

## 2022-12-13 DIAGNOSIS — I10 Essential (primary) hypertension: Secondary | ICD-10-CM | POA: Diagnosis not present

## 2022-12-13 DIAGNOSIS — Z794 Long term (current) use of insulin: Secondary | ICD-10-CM | POA: Insufficient documentation

## 2022-12-13 DIAGNOSIS — J449 Chronic obstructive pulmonary disease, unspecified: Secondary | ICD-10-CM | POA: Diagnosis not present

## 2022-12-13 DIAGNOSIS — W01198A Fall on same level from slipping, tripping and stumbling with subsequent striking against other object, initial encounter: Secondary | ICD-10-CM | POA: Insufficient documentation

## 2022-12-13 DIAGNOSIS — S0083XA Contusion of other part of head, initial encounter: Secondary | ICD-10-CM | POA: Diagnosis not present

## 2022-12-13 DIAGNOSIS — Z79899 Other long term (current) drug therapy: Secondary | ICD-10-CM | POA: Diagnosis not present

## 2022-12-13 DIAGNOSIS — Z7984 Long term (current) use of oral hypoglycemic drugs: Secondary | ICD-10-CM | POA: Diagnosis not present

## 2022-12-13 DIAGNOSIS — Z8616 Personal history of COVID-19: Secondary | ICD-10-CM | POA: Diagnosis not present

## 2022-12-13 DIAGNOSIS — Z7901 Long term (current) use of anticoagulants: Secondary | ICD-10-CM | POA: Diagnosis not present

## 2022-12-13 MED ORDER — OXYCODONE-ACETAMINOPHEN 5-325 MG PO TABS: 1.0000 | ORAL_TABLET | ORAL | 0 refills | Status: DC | PRN

## 2022-12-13 MED ORDER — FENTANYL CITRATE PF 50 MCG/ML IJ SOSY
50.0000 ug | PREFILLED_SYRINGE | Freq: Once | INTRAMUSCULAR | Status: AC
  Administered 2022-12-13: 50 ug via INTRAVENOUS
  Filled 2022-12-13: qty 1

## 2022-12-13 MED ORDER — OXYCODONE-ACETAMINOPHEN 5-325 MG PO TABS
1.0000 | ORAL_TABLET | Freq: Once | ORAL | Status: AC
  Administered 2022-12-13: 1 via ORAL
  Filled 2022-12-13: qty 1

## 2022-12-13 NOTE — Discharge Instructions (Signed)
You may take Percocet as needed for pain.  Wear sling as needed for comfort.  Return to the ER for worsening symptoms, persistent vomiting, difficulty breathing, lethargy or other concerns.

## 2022-12-13 NOTE — ED Triage Notes (Signed)
Pt bib ems from home for mechanical fall. On eliquis. No loc. + head trauma - abrasion to right forehead. No cervical spine tenderness. C/o pain to right shoulder.

## 2022-12-13 NOTE — ED Provider Notes (Signed)
[facility] Provider Note    Event Date/Time   First MD Initiated Contact with Patient 12/13/22 [phone]     (approximate)   History   Fall   HPI  Zachary Deleon is a 57 y.o. male brought to the ED via EMS from home status post mechanical fall.  Patient tripped and fell, striking his right forehead and landing on his right elbow.  He does take Eliquis.  Denies LOC.  Denies vision changes, neck pain, chest pain, shortness of breath, abdominal pain, nausea, vomiting or dizziness.     Past Medical History   Past Medical History:  Diagnosis Date   Arthritis    Carotid stenosis    COPD (chronic obstructive pulmonary disease) (HCC)    Depression    Diabetes mellitus without complication (HCC)    DVT (deep venous thrombosis) (HCC)    Hypertension    OSA (obstructive sleep apnea)    Restrictive lung disease    Seizures (HCC)    Status post carotid endarterectomy    with patch angioplasty   Stroke (HCC) 11/2017     Active Problem List   Patient Active Problem List   Diagnosis Date Noted   Acute respiratory disease due to COVID-19 virus 02/16/2021   DVT (deep venous thrombosis) (HCC)    Seizures (HCC)    Severe sepsis (HCC)    Thrombocytopenia (HCC)    Hypomagnesemia    Hyponatremia    Diabetes mellitus type 2 in obese    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   Left hemiparesis (HCC)    Morbid obesity (HCC)    Essential hypertension    Stroke (HCC) 12/02/2017   Type 2 diabetes mellitus without complication (HCC) 12/02/2017     Past Surgical History   Past Surgical History:  Procedure Laterality Date   BRAIN SURGERY     BRAIN TUMOR EXCISION     performed at [facility]   CATARACT EXTRACTION       Home Medications   Prior to Admission medications   Medication Sig Start Date End Date Taking? Authorizing Provider  oxyCODONE-acetaminophen (PERCOCET/ROXICET) 5-325 MG tablet Take 1 tablet by mouth every 4 (four) hours as  needed for severe pain. 12/13/22  Yes Irean Hong, MD  ACCU-CHEK GUIDE test strip  02/18/22   [provider]  acetaminophen (TYLENOL) 325 MG tablet Take 2 tablets (650 mg total) by mouth every 4 (four) hours as needed for mild pain (or temp > 37.5 C (99.5 F)). 12/20/17   Angiulli, Mcarthur Rossetti, PA-C  albuterol (VENTOLIN HFA) 108 (90 Base) MCG/ACT inhaler Inhale 2 puffs into the lungs every 4 (four) hours as needed for wheezing or shortness of breath. 12/20/17   Angiulli, Mcarthur Rossetti, PA-C  amLODipine (NORVASC) 10 MG tablet Take 1 tablet (10 mg total) by mouth daily. 12/21/17   Angiulli, Mcarthur Rossetti, PA-C  apixaban (ELIQUIS) 5 MG TABS tablet Take 2 tablets (10 mg total) by mouth 2 (two) times daily for 5 days. 02/20/21 02/25/21  Charise Killian, MD  apixaban (ELIQUIS) 5 MG TABS tablet Take 1 tablet (5 mg total) by mouth 2 (two) times daily. Only start taking this script after you have completed eliquis 10 mg BID x 5 days 02/25/21 03/27/21  Charise Killian, MD  atorvastatin (LIPITOR) 80 MG tablet Take 1 tablet (80 mg total) by mouth daily. 12/20/17   Angiulli, Mcarthur Rossetti, PA-C  benzonatate (TESSALON) 100 MG capsule Take 1-2 tablets 3 times a  day as needed for cough 05/30/22   Immordino, Jeannett Senior, FNP  clopidogrel (PLAVIX) 75 MG tablet Take 1 tablet (75 mg total) by mouth daily with breakfast. 12/20/17   Angiulli, Mcarthur Rossetti, PA-C  cyclobenzaprine (FLEXERIL) 10 MG tablet Take 10 mg by mouth 2 (two) times daily as needed. 02/04/21   [provider]  diclofenac sodium (VOLTAREN) 1 % GEL Apply 2 g topically 4 (four) times daily. 12/20/17   Angiulli, Mcarthur Rossetti, PA-C  famotidine (PEPCID) 20 MG tablet TAKE 1 TABLET TWICE DAILY AS NEEDED FOR HEARTBURN 08/02/21   [provider]  fenofibrate 160 MG tablet Take 1 tablet (160 mg total) by mouth daily. Patient not taking: No sig reported 12/21/17   Angiulli, Mcarthur Rossetti, PA-C  fluconazole (DIFLUCAN) 150 MG tablet  02/12/20   [provider]  FLUoxetine  (PROZAC) 40 MG capsule Take 40 mg by mouth daily. 01/17/21   [provider]  Fluticasone-Salmeterol (ADVAIR) 250-50 MCG/DOSE AEPB Inhale 1 puff into the lungs 2 (two) times daily. 12/20/17   Angiulli, Mcarthur Rossetti, PA-C  furosemide (LASIX) 20 MG tablet Take 1 tablet (20 mg total) by mouth daily. 02/27/21   Horton, Mayer Masker, MD  insulin aspart (NOVOLOG) 100 UNIT/ML FlexPen INJECT 20 UNITS UNDER THE SKIN THREE TIMES DAILY BEFORE MEALS PLUS SLIDING SCALE UP TO 80 UNITS EVERY DAY 04/12/20   [provider]  ketoconazole (NIZORAL) 2 % shampoo LATHER ON WET BEARD. LEAVE ON FOR 3-5 MINUTES AND RINSE. APPLY TWICE WEEKLY FOR 2-4 WEEKS 01/14/21   [provider]  lidocaine (LIDODERM) 5 % Place 1 patch onto the skin daily. Remove & Discard patch within 12 hours or as directed by MD 12/21/17   Angiulli, Mcarthur Rossetti, PA-C  loratadine (CLARITIN) 10 MG tablet Take 1 tablet by mouth daily. 01/12/21   [provider]  losartan (COZAAR) 50 MG tablet Take 75 mg by mouth daily. 12/27/20   [provider]  metFORMIN (GLUCOPHAGE-XR) 500 MG 24 hr tablet Take 1,000 mg by mouth 2 (two) times daily. 01/13/21   [provider]  methocarbamol (ROBAXIN) 500 MG tablet Take 1 tablet (500 mg total) by mouth every 8 (eight) hours as needed for muscle spasms. 12/20/17   Angiulli, Mcarthur Rossetti, PA-C  nicotine (NICODERM CQ - DOSED IN MG/24 HOURS) 14 mg/24hr patch 14 mg patch daily x3 weeks then 7 mg patch daily x3 weeks and stop 12/20/17   Angiulli, Mcarthur Rossetti, PA-C  nystatin (MYCOSTATIN/NYSTOP) powder Apply topically. 02/09/21   [provider]  pantoprazole (PROTONIX) 40 MG tablet Take 40 mg by mouth daily.    [provider]  pregabalin (LYRICA) 75 MG capsule Take 75 mg by mouth 2 (two) times daily. 02/10/21   [provider]  QUEtiapine (SEROQUEL) 25 MG tablet Take 0.5 tablets (12.5 mg total) by mouth 2 (two) times daily as needed (agitation). 12/20/17   Angiulli, Mcarthur Rossetti, PA-C   senna-docusate (SENOKOT-S) 8.6-50 MG tablet Take 1 tablet by mouth at bedtime. 12/06/17   Johnson, Clanford L, MD  tamsulosin (FLOMAX) 0.4 MG CAPS capsule Take 1 capsule by mouth daily. 10/08/20   [provider]  tiotropium (SPIRIVA) 18 MCG inhalation capsule Place 18 mcg into inhaler and inhale daily.    [provider]  topiramate (TOPAMAX) 25 MG tablet Take 1 tablet (25 mg total) by mouth 2 (two) times daily. 12/20/17   Angiulli, Mcarthur Rossetti, PA-C  TOUJEO SOLOSTAR 300 UNIT/ML Solostar Pen Inject into the skin. 01/14/21  [provider]  traMADol (ULTRAM) 50 MG tablet Take 0.5-1 tablets (25-50 mg total) by mouth every 6 (six) hours as needed for moderate pain or severe pain. 12/20/17   Angiulli, Mcarthur Rossetti, PA-C     Allergies  Morphine and codeine and Trulicity [dulaglutide]   Family History   Family History  Problem Relation Age of Onset   Diabetes Mellitus II Father    Hypertension Father    Hypertension Brother      Physical Exam  Triage Vital Signs: ED Triage Vitals [12/13/22 0523]  Enc Vitals Group     BP      Pulse      Resp      Temp      Temp src      SpO2      Weight (!) 350 lb (158.8 kg)     Height 6\' 2"  (1.88 m)     Head Circumference      Peak Flow      Pain Score      Pain Loc      Pain Edu?      Excl. in GC?     Updated Vital Signs: BP (!) 144/73   Pulse 79   Temp 98.4 F (36.9 C) (Oral)   Resp 20   Ht 6\' 2"  (1.88 m)   Wt (!) 158.8 kg   SpO2 95%   BMI 44.94 kg/m    General: Awake, mild distress.  CV:  RRR.  Good peripheral perfusion.  Resp:  Normal effort.  CTAB.  On oxygen as needed at home and at night. Abd:  Nontender.  No distention.  Other:  Small forehead hematoma which is nonbleeding.  PERRL.  EOMI.  Nose is atraumatic.  No dental malocclusion.  No midline cervical spine tenderness to palpation.  Pelvis is stable.  Full range of motion both hips without pain.  He is able to stand and transfer from EMS stretcher to  ED stretcher.  Right shoulder and makeshift sling done by EMS.  Tender to palpation anteriorly.  Limited range of motion secondary to pain.  2+ radial pulses.  Brisk, less than 5-second cap refill.   ED Results / Procedures / Treatments  Labs (all labs ordered are listed, but only abnormal results are displayed) Labs Reviewed - No data to display   EKG  None   RADIOLOGY I have independently visualized and interpreted patient CT and x-ray as well as noted the radiology interpretation:  CT head: No ICH  Right shoulder x-ray: No acute fracture or dislocation  Official radiology report(s): DG Shoulder Right  Result Date: 12/13/2022 CLINICAL DATA:  Status post fall. EXAM: RIGHT SHOULDER - 2+ VIEW COMPARISON:  None Available. FINDINGS: There is no evidence of fracture or dislocation. Degenerative changes noted at the [facility] joint. Soft tissues are unremarkable. IMPRESSION: 1. No fracture or dislocation. 2. Degenerative changes at the [facility] joint. Electronically Signed   By: Signa Kell M.D.   On: 12/13/2022 06:23   CT Head Wo Contrast  Result Date: 12/13/2022 CLINICAL DATA:  Head trauma due to mechanical fall on Eliquis. EXAM: CT HEAD WITHOUT CONTRAST TECHNIQUE: Contiguous axial images were obtained from the base of the skull through the vertex without intravenous contrast. RADIATION DOSE REDUCTION: This exam was performed according to the departmental dose-optimization program which includes automated exposure control, adjustment of the mA and/or kV according to patient size and/or use of iterative reconstruction technique. COMPARISON:  03/29/2018 FINDINGS: Brain: Patchy encephalomalacia along the  cortex of the right cerebral hemisphere in the upper MCA distribution and along the right cerebellum. 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 Cerebral volume loss contributes to  ventriculomegaly that is generalized. No evidence of acute infarct, hemorrhage, or mass. Vascular: No hyperdense vessel or unexpected calcification. Skull: Unremarkable occipital craniotomy. Sinuses/Orbits: No acute finding IMPRESSION: 1. No evidence of intracranial injury. 2. Encephalomalacia and ventriculomegaly that is stable from 2019. Electronically Signed   By: Tiburcio Pea M.D.   On: 12/13/2022 05:52     PROCEDURES:  Critical Care performed: No  Procedures   MEDICATIONS ORDERED IN ED: Medications  fentaNYL (SUBLIMAZE) injection 50 mcg (50 mcg Intravenous Given 12/13/22 0533)  oxyCODONE-acetaminophen (PERCOCET/ROXICET) 5-325 MG per tablet 1 tablet (1 tablet Oral Given 12/13/22 1610)     IMPRESSION / MDM / ASSESSMENT AND PLAN / ED COURSE  I reviewed the triage vital signs and the nursing notes.                             57 year old male presenting with mechanical fall with forehead contusion and right shoulder injury.  Differential diagnosis includes but is not limited to ICH, SDH, right shoulder fracture, dislocation, musculoskeletal contusion, etc.  I personally reviewed patient's records and note a PCP office visit from 12/06/2022 for follow-up diabetes, hypertension, COPD and morbid obesity.  Patient's presentation is most consistent with acute complicated illness / injury requiring diagnostic workup.  Will obtain CT head, right shoulder x-ray.  Administer IV fentanyl for pain and reassess.  Clinical Course as of 12/13/22 0645  Wed Dec 13, 2022  [id number] Updated patient and family members of negative imaging results.  Will transition to sling, administer Percocet now with prescription and patient will follow-up with Dr. Martha Clan who he has seen before from orthopedics.  Strict return precautions given.  All verbalized understanding agree with plan of care. [JS]    Clinical Course User Index [JS] Irean Hong, MD     FINAL CLINICAL IMPRESSION(S) / ED DIAGNOSES   Final  diagnoses:  Fall, initial encounter  Contusion of face, initial encounter  Acute pain of right shoulder     Rx / DC Orders   ED Discharge Orders          Ordered    oxyCODONE-acetaminophen (PERCOCET/ROXICET) 5-325 MG tablet  Every 4 hours PRN        12/13/22 0636             Note:  This document was prepared using Dragon voice recognition software and may include unintentional dictation errors.   Irean Hong, MD 12/13/22 [phone]

## 2023-08-29 ENCOUNTER — Other Ambulatory Visit (INDEPENDENT_AMBULATORY_CARE_PROVIDER_SITE_OTHER): Payer: Self-pay | Admitting: Nurse Practitioner

## 2023-08-29 DIAGNOSIS — I739 Peripheral vascular disease, unspecified: Secondary | ICD-10-CM

## 2023-08-30 ENCOUNTER — Encounter (INDEPENDENT_AMBULATORY_CARE_PROVIDER_SITE_OTHER): Payer: Medicare HMO | Admitting: Nurse Practitioner

## 2023-08-30 ENCOUNTER — Encounter (INDEPENDENT_AMBULATORY_CARE_PROVIDER_SITE_OTHER): Payer: Medicaid Other

## 2023-10-30 ENCOUNTER — Encounter (INDEPENDENT_AMBULATORY_CARE_PROVIDER_SITE_OTHER): Payer: Self-pay

## 2023-10-31 ENCOUNTER — Encounter (INDEPENDENT_AMBULATORY_CARE_PROVIDER_SITE_OTHER): Admitting: Nurse Practitioner

## 2023-10-31 ENCOUNTER — Encounter (INDEPENDENT_AMBULATORY_CARE_PROVIDER_SITE_OTHER)

## 2024-02-13 ENCOUNTER — Ambulatory Visit: Admitting: Endocrinology

## 2024-05-03 ENCOUNTER — Other Ambulatory Visit: Payer: Self-pay

## 2024-05-03 ENCOUNTER — Emergency Department

## 2024-05-03 ENCOUNTER — Inpatient Hospital Stay
Admission: EM | Admit: 2024-05-03 | Discharge: 2024-05-10 | DRG: 291 | Disposition: A | Discharge status: Home Health | Attending: Osteopathic Medicine | Admitting: Osteopathic Medicine

## 2024-05-03 DIAGNOSIS — I5020 Unspecified systolic (congestive) heart failure: Secondary | ICD-10-CM | POA: Insufficient documentation

## 2024-05-03 DIAGNOSIS — D631 Anemia in chronic kidney disease: Secondary | ICD-10-CM | POA: Diagnosis present

## 2024-05-03 DIAGNOSIS — Z8673 Personal history of transient ischemic attack (TIA), and cerebral infarction without residual deficits: Secondary | ICD-10-CM

## 2024-05-03 DIAGNOSIS — I110 Hypertensive heart disease with heart failure: Principal | ICD-10-CM | POA: Diagnosis present

## 2024-05-03 DIAGNOSIS — Z86711 Personal history of pulmonary embolism: Secondary | ICD-10-CM

## 2024-05-03 DIAGNOSIS — N189 Chronic kidney disease, unspecified: Secondary | ICD-10-CM | POA: Diagnosis present

## 2024-05-03 DIAGNOSIS — I2510 Atherosclerotic heart disease of native coronary artery without angina pectoris: Secondary | ICD-10-CM | POA: Diagnosis present

## 2024-05-03 DIAGNOSIS — R0603 Acute respiratory distress: Secondary | ICD-10-CM

## 2024-05-03 DIAGNOSIS — Z9981 Dependence on supplemental oxygen: Secondary | ICD-10-CM

## 2024-05-03 DIAGNOSIS — I509 Heart failure, unspecified: Secondary | ICD-10-CM

## 2024-05-03 DIAGNOSIS — Z87891 Personal history of nicotine dependence: Secondary | ICD-10-CM

## 2024-05-03 DIAGNOSIS — E662 Morbid (severe) obesity with alveolar hypoventilation: Secondary | ICD-10-CM | POA: Diagnosis present

## 2024-05-03 DIAGNOSIS — I493 Ventricular premature depolarization: Secondary | ICD-10-CM | POA: Diagnosis present

## 2024-05-03 DIAGNOSIS — Z86718 Personal history of other venous thrombosis and embolism: Secondary | ICD-10-CM

## 2024-05-03 DIAGNOSIS — Z79899 Other long term (current) drug therapy: Secondary | ICD-10-CM

## 2024-05-03 DIAGNOSIS — J9621 Acute and chronic respiratory failure with hypoxia: Secondary | ICD-10-CM | POA: Diagnosis present

## 2024-05-03 DIAGNOSIS — Z7951 Long term (current) use of inhaled steroids: Secondary | ICD-10-CM

## 2024-05-03 DIAGNOSIS — E66813 Obesity, class 3: Secondary | ICD-10-CM | POA: Insufficient documentation

## 2024-05-03 DIAGNOSIS — E119 Type 2 diabetes mellitus without complications: Secondary | ICD-10-CM

## 2024-05-03 DIAGNOSIS — Z9889 Other specified postprocedural states: Secondary | ICD-10-CM

## 2024-05-03 DIAGNOSIS — G40909 Epilepsy, unspecified, not intractable, without status epilepticus: Secondary | ICD-10-CM | POA: Diagnosis present

## 2024-05-03 DIAGNOSIS — Z7901 Long term (current) use of anticoagulants: Secondary | ICD-10-CM

## 2024-05-03 DIAGNOSIS — J449 Chronic obstructive pulmonary disease, unspecified: Secondary | ICD-10-CM | POA: Insufficient documentation

## 2024-05-03 DIAGNOSIS — I69354 Hemiplegia and hemiparesis following cerebral infarction affecting left non-dominant side: Secondary | ICD-10-CM

## 2024-05-03 DIAGNOSIS — Z1152 Encounter for screening for COVID-19: Secondary | ICD-10-CM

## 2024-05-03 DIAGNOSIS — Z885 Allergy status to narcotic agent status: Secondary | ICD-10-CM

## 2024-05-03 DIAGNOSIS — G4733 Obstructive sleep apnea (adult) (pediatric): Secondary | ICD-10-CM | POA: Insufficient documentation

## 2024-05-03 DIAGNOSIS — E872 Acidosis, unspecified: Secondary | ICD-10-CM | POA: Diagnosis present

## 2024-05-03 DIAGNOSIS — Z8616 Personal history of COVID-19: Secondary | ICD-10-CM

## 2024-05-03 DIAGNOSIS — E1165 Type 2 diabetes mellitus with hyperglycemia: Secondary | ICD-10-CM | POA: Diagnosis present

## 2024-05-03 DIAGNOSIS — E1169 Type 2 diabetes mellitus with other specified complication: Secondary | ICD-10-CM

## 2024-05-03 DIAGNOSIS — Z6841 Body Mass Index (BMI) 40.0 and over, adult: Secondary | ICD-10-CM

## 2024-05-03 DIAGNOSIS — K219 Gastro-esophageal reflux disease without esophagitis: Secondary | ICD-10-CM | POA: Diagnosis present

## 2024-05-03 DIAGNOSIS — J441 Chronic obstructive pulmonary disease with (acute) exacerbation: Principal | ICD-10-CM | POA: Diagnosis present

## 2024-05-03 DIAGNOSIS — E1151 Type 2 diabetes mellitus with diabetic peripheral angiopathy without gangrene: Secondary | ICD-10-CM | POA: Diagnosis present

## 2024-05-03 DIAGNOSIS — D696 Thrombocytopenia, unspecified: Secondary | ICD-10-CM | POA: Diagnosis present

## 2024-05-03 DIAGNOSIS — I5023 Acute on chronic systolic (congestive) heart failure: Secondary | ICD-10-CM | POA: Diagnosis present

## 2024-05-03 DIAGNOSIS — I10 Essential (primary) hypertension: Secondary | ICD-10-CM | POA: Insufficient documentation

## 2024-05-03 DIAGNOSIS — E1142 Type 2 diabetes mellitus with diabetic polyneuropathy: Secondary | ICD-10-CM | POA: Diagnosis present

## 2024-05-03 DIAGNOSIS — Z794 Long term (current) use of insulin: Secondary | ICD-10-CM

## 2024-05-03 DIAGNOSIS — F32A Depression, unspecified: Secondary | ICD-10-CM | POA: Diagnosis present

## 2024-05-03 DIAGNOSIS — J810 Acute pulmonary edema: Secondary | ICD-10-CM

## 2024-05-03 DIAGNOSIS — Z882 Allergy status to sulfonamides status: Secondary | ICD-10-CM

## 2024-05-03 DIAGNOSIS — I4891 Unspecified atrial fibrillation: Secondary | ICD-10-CM | POA: Diagnosis not present

## 2024-05-03 DIAGNOSIS — I2489 Other forms of acute ischemic heart disease: Secondary | ICD-10-CM | POA: Diagnosis present

## 2024-05-03 DIAGNOSIS — N179 Acute kidney failure, unspecified: Secondary | ICD-10-CM | POA: Diagnosis present

## 2024-05-03 DIAGNOSIS — E1140 Type 2 diabetes mellitus with diabetic neuropathy, unspecified: Secondary | ICD-10-CM | POA: Diagnosis present

## 2024-05-03 DIAGNOSIS — Z7984 Long term (current) use of oral hypoglycemic drugs: Secondary | ICD-10-CM

## 2024-05-03 DIAGNOSIS — E871 Hypo-osmolality and hyponatremia: Secondary | ICD-10-CM | POA: Diagnosis present

## 2024-05-03 DIAGNOSIS — I739 Peripheral vascular disease, unspecified: Secondary | ICD-10-CM | POA: Insufficient documentation

## 2024-05-03 DIAGNOSIS — Z66 Do not resuscitate: Secondary | ICD-10-CM | POA: Diagnosis not present

## 2024-05-03 HISTORY — DX: Chronic obstructive pulmonary disease, unspecified: J44.9

## 2024-05-03 HISTORY — DX: Cerebral infarction, unspecified: I63.9

## 2024-05-03 HISTORY — DX: Heart failure, unspecified: I50.9

## 2024-05-03 HISTORY — DX: Type 2 diabetes mellitus without complications: E11.9

## 2024-05-03 LAB — CBC WITH DIFFERENTIAL/PLATELET
Abs Immature Granulocytes: 0.11 K/uL — ABNORMAL HIGH (ref 0.00–0.07)
Basophils Absolute: 0.1 K/uL (ref 0.0–0.1)
Basophils Relative: 0 %
Eosinophils Absolute: 0.1 K/uL (ref 0.0–0.5)
Eosinophils Relative: 1 %
HCT: 29.3 % — ABNORMAL LOW (ref 39.0–52.0)
Hemoglobin: 9 g/dL — ABNORMAL LOW (ref 13.0–17.0)
Immature Granulocytes: 1 %
Lymphocytes Relative: 15 %
Lymphs Abs: 1.8 K/uL (ref 0.7–4.0)
MCH: 25.1 pg — ABNORMAL LOW (ref 26.0–34.0)
MCHC: 30.7 g/dL (ref 30.0–36.0)
MCV: 81.6 fL (ref 80.0–100.0)
Monocytes Absolute: 1.4 K/uL — ABNORMAL HIGH (ref 0.1–1.0)
Monocytes Relative: 12 %
Neutro Abs: 8.7 K/uL — ABNORMAL HIGH (ref 1.7–7.7)
Neutrophils Relative %: 71 %
Platelets: 134 K/uL — ABNORMAL LOW (ref 150–400)
RBC: 3.59 MIL/uL — ABNORMAL LOW (ref 4.22–5.81)
RDW: 17.8 % — ABNORMAL HIGH (ref 11.5–15.5)
WBC: 12.1 K/uL — ABNORMAL HIGH (ref 4.0–10.5)
nRBC: 0 % (ref 0.0–0.2)

## 2024-05-03 LAB — TROPONIN T, HIGH SENSITIVITY: Troponin T High Sensitivity: 180 ng/L (ref 0–19)

## 2024-05-03 LAB — COMPREHENSIVE METABOLIC PANEL WITH GFR
ALT: 41 U/L (ref 0–44)
AST: 51 U/L — ABNORMAL HIGH (ref 15–41)
Albumin: 4.2 g/dL (ref 3.5–5.0)
Alkaline Phosphatase: 66 U/L (ref 38–126)
Anion gap: 16 — ABNORMAL HIGH (ref 5–15)
BUN: 19 mg/dL (ref 6–20)
CO2: 20 mmol/L — ABNORMAL LOW (ref 22–32)
Calcium: 8.2 mg/dL — ABNORMAL LOW (ref 8.9–10.3)
Chloride: 95 mmol/L — ABNORMAL LOW (ref 98–111)
Creatinine, Ser: 1.47 mg/dL — ABNORMAL HIGH (ref 0.61–1.24)
GFR, Estimated: 55 mL/min — ABNORMAL LOW (ref >60)
Glucose, Bld: 330 mg/dL — ABNORMAL HIGH (ref 70–99)
Potassium: 4.1 mmol/L (ref 3.5–5.1)
Sodium: 131 mmol/L — ABNORMAL LOW (ref 135–145)
Total Bilirubin: 0.6 mg/dL (ref 0.0–1.2)
Total Protein: 7.4 g/dL (ref 6.5–8.1)

## 2024-05-03 LAB — BLOOD GAS, VENOUS
Acid-base deficit: 4.3 mmol/L — ABNORMAL HIGH (ref 0.0–2.0)
Bicarbonate: 21.6 mmol/L (ref 20.0–28.0)
O2 Saturation: 82.7 %
Patient temperature: 37
pCO2, Ven: 42 mmHg — ABNORMAL LOW (ref 44–60)
pH, Ven: 7.32 (ref 7.25–7.43)
pO2, Ven: 57 mmHg — ABNORMAL HIGH (ref 32–45)

## 2024-05-03 LAB — PRO BRAIN NATRIURETIC PEPTIDE: Pro Brain Natriuretic Peptide: 1811 pg/mL — ABNORMAL HIGH (ref <300.0)

## 2024-05-03 LAB — LIPASE, BLOOD: Lipase: 22 U/L (ref 11–51)

## 2024-05-03 LAB — LACTIC ACID, PLASMA: Lactic Acid, Venous: 3.9 mmol/L (ref 0.5–1.9)

## 2024-05-03 MED ORDER — IPRATROPIUM-ALBUTEROL 0.5-2.5 (3) MG/3ML IN SOLN
3.0000 mL | Freq: Once | RESPIRATORY_TRACT | Status: AC
  Administered 2024-05-03: 3 mL via RESPIRATORY_TRACT

## 2024-05-03 MED ORDER — IPRATROPIUM-ALBUTEROL 0.5-2.5 (3) MG/3ML IN SOLN
3.0000 mL | Freq: Once | RESPIRATORY_TRACT | Status: AC
  Administered 2024-05-03: 3 mL via RESPIRATORY_TRACT
  Filled 2024-05-03: qty 6

## 2024-05-03 NOTE — ED Triage Notes (Signed)
 Pt BIB [facility] for resp distress. All Day.

## 2024-05-03 NOTE — ED Triage Notes (Signed)
 Hx COPD, CHF, DM, CVA. EMS found 80% at home, arrives 94% on c-pap. 1 g mag, 1 duo neb, 125 solu medrol  PTA

## 2024-05-03 NOTE — ED Notes (Signed)
 Notified Floy MD of critical lab results.

## 2024-05-03 NOTE — ED Provider Notes (Signed)
 [facility] Provider Note    Event Date/Time   First MD Initiated Contact with Patient 05/03/24 2255     (approximate)   History   Respiratory Distress   HPI  Zachary Deleon is a 58 y.o. male with a history of COPD on 2 L of oxygen at baseline with a previous history of smoking, CHF, CAD, carotid stenosis, hypertension, morbid obesity who presents acutely with EMS for respiratory distress.  Patient tells me that for the past 3 weeks he has become increasingly short of breath with increasing cough.  This acutely worsened over the past 24 hours.  He was hypoxic to 78% on EMS arrival and his home O2 and placed on CPAP.  He was given 125 mg of Solu-Medrol , 1 DuoNeb 1 g of magnesium by EMS prior to arrival.  They were unable to get a blood pressure due to his diaphoresis.  Since EMS interventions he feels much improved.  He reports chest tightness but denies any chest pain.  He states that he takes medications every day but is unable to tell me whether he has a primary care physician.      Physical Exam   Triage Vital Signs: ED Triage Vitals  Encounter Vitals Group     BP      Girls Systolic BP Percentile      Girls Diastolic BP Percentile      Boys Systolic BP Percentile      Boys Diastolic BP Percentile      Pulse      Resp      Temp      Temp src      SpO2      Weight      Height      Head Circumference      Peak Flow      Pain Score      Pain Loc      Pain Education      Exclude from Growth Chart     Most recent vital signs: Vitals:   05/03/24 2255  BP: (!) 134/106  Pulse: (!) 102  Resp: (!) 29  Temp: 97.6 F (36.4 C)  SpO2: 99%    Nursing Triage Note reviewed. Vital signs reviewed and patients oxygen saturation is hypoxic  General: Patient is well nourished, well developed, awake and alert, appears unwell Head: Normocephalic and atraumatic Eyes: Normal inspection, extraocular muscles intact, no conjunctival pallor Ear, nose, throat:  Normal external exam Neck: Normal range of motion Respiratory: Patient is in respiratory distress, lungs with not moving great air, has wheezes in the upper lobes Cardiovascular: Patient is tachycardic, RR  GI: Abd SNT with no guarding or rebound  Back: Normal inspection of the back with good strength and range of motion throughout all ext Extremities: pulses intact with good cap refills, 2+ LE pitting edema, no calf Neuro: The patient is alert and oriented to person, place, and time, appropriately conversive, with 5/5 bilat UE/LE strength, no gross motor or sensory defects noted. Coordination appears to be adequate. Skin: Warm, dry, and intact Psych: anxious mood and affect, no SI or HI  ED Results / Procedures / Treatments   Labs (all labs ordered are listed, but only abnormal results are displayed) Labs Reviewed  BLOOD GAS, VENOUS - Abnormal; Notable for the following components:      Result Value   pCO2, Ven 42 (*)    pO2, Ven 57 (*)    Acid-base deficit 4.3 (*)  All other components within normal limits  CBC WITH DIFFERENTIAL/PLATELET - Abnormal; Notable for the following components:   WBC 12.1 (*)    RBC 3.59 (*)    Hemoglobin 9.0 (*)    HCT 29.3 (*)    MCH 25.1 (*)    RDW 17.8 (*)    Platelets 134 (*)    Neutro Abs 8.7 (*)    Monocytes Absolute 1.4 (*)    Abs Immature Granulocytes 0.11 (*)    All other components within normal limits  COMPREHENSIVE METABOLIC PANEL WITH GFR - Abnormal; Notable for the following components:   Sodium 131 (*)    Chloride 95 (*)    CO2 20 (*)    Glucose, Bld 330 (*)    Creatinine, Ser 1.47 (*)    Calcium  8.2 (*)    AST 51 (*)    GFR, Estimated 55 (*)    Anion gap 16 (*)    All other components within normal limits  PRO BRAIN NATRIURETIC PEPTIDE - Abnormal; Notable for the following components:   Pro Brain Natriuretic Peptide 1,811.0 (*)    All other components within normal limits  LACTIC ACID, PLASMA - Abnormal; Notable for the  following components:   Lactic Acid, Venous 3.9 (*)    All other components within normal limits  TROPONIN T, HIGH SENSITIVITY - Abnormal; Notable for the following components:   Troponin T High Sensitivity 180 (*)    All other components within normal limits  RESP PANEL BY RT-PCR (RSV, FLU A&B, COVID)  RVPGX2  CULTURE, BLOOD (ROUTINE X 2)  CULTURE, BLOOD (ROUTINE X 2)  LIPASE, BLOOD  URINALYSIS, COMPLETE (UACMP) WITH MICROSCOPIC     EKG EKG and rhythm strip are interpreted by myself:   EKG: [tachycardic sinus rhythm] at heart rate of 101, normal QRS duration, QTc 423, nonspecific ST segments and T waves no ectopy EKG not consistent with Acute STEMI Rhythm strip: tachycardic in lead II   RADIOLOGY CXR: Consistent with pulmonary edema my independent review interpretation radiologist agrees    PROCEDURES:  Critical Care performed: Yes, see critical care procedure note(s)  .Critical Care  Performed by: Nicholaus Rolland BRAVO, MD Authorized by: Nicholaus Rolland BRAVO, MD   Critical care provider statement:    Critical care time (minutes):  31   Critical care was necessary to treat or prevent imminent or life-threatening deterioration of the following conditions:  Respiratory failure   Critical care was time spent personally by me on the following activities:  Development of treatment plan with patient or surrogate, discussions with consultants, evaluation of patient's response to treatment, examination of patient, ordering and review of laboratory studies, ordering and review of radiographic studies, ordering and performing treatments and interventions, pulse oximetry, re-evaluation of patient's condition and review of old charts Comments:     Management of BiPAP and respiratory distress    MEDICATIONS ORDERED IN ED: Medications  ipratropium-albuterol  (DUONEB) 0.5-2.5 (3) MG/3ML nebulizer solution 3 mL (3 mLs Nebulization Given 05/03/24 2311)  ipratropium-albuterol  (DUONEB) 0.5-2.5 (3)  MG/3ML nebulizer solution 3 mL (3 mLs Nebulization Given 05/03/24 2336)     IMPRESSION / MDM / ASSESSMENT AND PLAN / ED COURSE                                Differential diagnosis includes, but is not limited to, COPD exacerbation, CHF exacerbation, flash pulmonary edema, pneumonia, electrolyte derangement anemia, sepsis   ED course:  Patient arrives acutely and history is limited as patient has not been to our hospital before and given his acuity of condition.  He was transition from CPAP to BiPAP on arrival and given 2 additional DuoNebs with much improvement.  Chest x-ray is consistent with pleural edema.  He does have a slightly elevated white blood cell count and tachycardia but this may be secondary to bronchospasm versus pulmonary edema.  Will hold off on antibiotics until further blood work is obtained as he is afebrile.  Case signed out to oncoming physician at 11:30 PM pending multiple labs and reassessment--with low threshold to initiate antibiotics with any further elevated inflammatory markers   Clinical Course as of 05/03/24 2355  Sat May 03, 2024  2313 CBC with Differential(!) [HD]  2313 WBC(!): 12.1 [HD]  2336 Patient signed out to oncoming physician [HD]    Clinical Course User Index [HD] Nicholaus Rolland BRAVO, MD   -- Risk: 5 This patient has a high risk of morbidity due to further diagnostic testing or treatment. Rationale: This patient's evaluation and management involve a high risk of morbidity due to the potential severity of presenting symptoms, need for diagnostic testing, and/or initiation of treatment that may require close monitoring. The differential includes conditions with potential for significant deterioration or requiring escalation of care. Treatment decisions in the ED, including medication administration, procedural interventions, or disposition planning, reflect this level of risk. COPA: 5 The patient has the following acute or chronic illness/injury that  poses a possible threat to life or bodily function: [X] : The patient has a potentially serious acute condition or an acute exacerbation of a chronic illness requiring urgent evaluation and management in the Emergency Department. The clinical presentation necessitates immediate consideration of life-threatening or function-threatening diagnoses, even if they are ultimately ruled out.   FINAL CLINICAL IMPRESSION(S) / ED DIAGNOSES   Final diagnoses:  COPD exacerbation (HCC)  Acute pulmonary edema (HCC)  Respiratory distress     Rx / DC Orders   ED Discharge Orders     None        Note:  This document was prepared using Dragon voice recognition software and may include unintentional dictation errors.   Nicholaus Rolland BRAVO, MD 05/03/24 [phone]

## 2024-05-04 ENCOUNTER — Other Ambulatory Visit: Payer: Self-pay

## 2024-05-04 ENCOUNTER — Observation Stay
Admit: 2024-05-04 | Discharge: 2024-05-04 | Disposition: A | Discharge status: Home / Self Care | Attending: Family Medicine | Admitting: Family Medicine

## 2024-05-04 DIAGNOSIS — J441 Chronic obstructive pulmonary disease with (acute) exacerbation: Secondary | ICD-10-CM | POA: Diagnosis not present

## 2024-05-04 DIAGNOSIS — E66813 Obesity, class 3: Secondary | ICD-10-CM | POA: Diagnosis not present

## 2024-05-04 DIAGNOSIS — I5031 Acute diastolic (congestive) heart failure: Secondary | ICD-10-CM | POA: Diagnosis not present

## 2024-05-04 DIAGNOSIS — J9621 Acute and chronic respiratory failure with hypoxia: Secondary | ICD-10-CM | POA: Diagnosis not present

## 2024-05-04 DIAGNOSIS — I509 Heart failure, unspecified: Secondary | ICD-10-CM

## 2024-05-04 LAB — LACTIC ACID, PLASMA
Lactic Acid, Venous: 2.4 mmol/L (ref 0.5–1.9)
Lactic Acid, Venous: 2.4 mmol/L (ref 0.5–1.9)

## 2024-05-04 LAB — RESPIRATORY PANEL BY PCR

## 2024-05-04 LAB — ECHOCARDIOGRAM COMPLETE
Area-P 1/2: 4.19 cm2
Calc EF: 39.2 %
Height: 74 in
S' Lateral: 5.5 cm
Single Plane A2C EF: 43 %
Single Plane A4C EF: 40 %
Weight: 5632 [oz_av]

## 2024-05-04 LAB — BLOOD CULTURE ID PANEL (REFLEXED) - BCID2

## 2024-05-04 LAB — CBC
HCT: 27.2 % — ABNORMAL LOW (ref 39.0–52.0)
Hemoglobin: 8.5 g/dL — ABNORMAL LOW (ref 13.0–17.0)
MCH: 25.1 pg — ABNORMAL LOW (ref 26.0–34.0)
MCHC: 31.3 g/dL (ref 30.0–36.0)
MCV: 80.5 fL (ref 80.0–100.0)
Platelets: 98 K/uL — ABNORMAL LOW (ref 150–400)
RBC: 3.38 MIL/uL — ABNORMAL LOW (ref 4.22–5.81)
RDW: 17.8 % — ABNORMAL HIGH (ref 11.5–15.5)
WBC: 8.3 K/uL (ref 4.0–10.5)
nRBC: 0 % (ref 0.0–0.2)

## 2024-05-04 LAB — BASIC METABOLIC PANEL WITH GFR
Anion gap: 14 (ref 5–15)
BUN: 25 mg/dL — ABNORMAL HIGH (ref 6–20)
CO2: 20 mmol/L — ABNORMAL LOW (ref 22–32)
Calcium: 7.9 mg/dL — ABNORMAL LOW (ref 8.9–10.3)
Chloride: 95 mmol/L — ABNORMAL LOW (ref 98–111)
Creatinine, Ser: 1.59 mg/dL — ABNORMAL HIGH (ref 0.61–1.24)
GFR, Estimated: 50 mL/min — ABNORMAL LOW (ref >60)
Glucose, Bld: 447 mg/dL — ABNORMAL HIGH (ref 70–99)
Potassium: 4.6 mmol/L (ref 3.5–5.1)
Sodium: 130 mmol/L — ABNORMAL LOW (ref 135–145)

## 2024-05-04 LAB — URINALYSIS, COMPLETE (UACMP) WITH MICROSCOPIC
Bacteria, UA: NONE SEEN
Bilirubin Urine: NEGATIVE
Glucose, UA: >=500 mg/dL — AB
Hgb urine dipstick: NEGATIVE
Ketones, ur: NEGATIVE mg/dL
Leukocytes,Ua: NEGATIVE
Nitrite: NEGATIVE
Protein, ur: 100 mg/dL — AB
Specific Gravity, Urine: 1.016 (ref 1.005–1.030)
pH: 5 (ref 5.0–8.0)

## 2024-05-04 LAB — BLOOD GAS, VENOUS
Acid-base deficit: 0.7 mmol/L (ref 0.0–2.0)
Bicarbonate: 24.2 mmol/L (ref 20.0–28.0)
Delivery systems: POSITIVE
FIO2: 35 %
O2 Saturation: 97.7 %
PEEP: 5 cmH2O
Patient temperature: 37
Pressure support: 10 cmH2O
pCO2, Ven: 40 mmHg — ABNORMAL LOW (ref 44–60)
pH, Ven: 7.39 (ref 7.25–7.43)
pO2, Ven: 95 mmHg — ABNORMAL HIGH (ref 32–45)

## 2024-05-04 LAB — HEMOGLOBIN A1C
Hgb A1c MFr Bld: 10.6 % — ABNORMAL HIGH (ref 4.8–5.6)
Mean Plasma Glucose: 257.52 mg/dL

## 2024-05-04 LAB — TROPONIN T, HIGH SENSITIVITY: Troponin T High Sensitivity: 181 ng/L (ref 0–19)

## 2024-05-04 LAB — GLUCOSE, CAPILLARY
Glucose-Capillary: 397 mg/dL — ABNORMAL HIGH (ref 70–99)
Glucose-Capillary: 434 mg/dL — ABNORMAL HIGH (ref 70–99)
Glucose-Capillary: 360 mg/dL — ABNORMAL HIGH (ref 70–99)
Glucose-Capillary: 380 mg/dL — ABNORMAL HIGH (ref 70–99)

## 2024-05-04 LAB — RESP PANEL BY RT-PCR (RSV, FLU A&B, COVID)  RVPGX2
Influenza A by PCR: NEGATIVE
Influenza B by PCR: NEGATIVE
Resp Syncytial Virus by PCR: NEGATIVE
SARS Coronavirus 2 by RT PCR: NEGATIVE

## 2024-05-04 LAB — CBG MONITORING, ED
Glucose-Capillary: 442 mg/dL — ABNORMAL HIGH (ref 70–99)
Glucose-Capillary: 451 mg/dL — ABNORMAL HIGH (ref 70–99)

## 2024-05-04 LAB — HIV ANTIBODY (ROUTINE TESTING W REFLEX): HIV Screen 4th Generation wRfx: NONREACTIVE

## 2024-05-04 MED ORDER — DOXYCYCLINE HYCLATE 100 MG PO TABS
100.0000 mg | ORAL_TABLET | Freq: Two times a day (BID) | ORAL | Status: DC
  Administered 2024-05-04 (×3): 100 mg via ORAL
  Filled 2024-05-04 (×4): qty 1

## 2024-05-04 MED ORDER — LORAZEPAM 2 MG/ML IJ SOLN
0.5000 mg | Freq: Once | INTRAMUSCULAR | Status: AC
  Administered 2024-05-04: 0.5 mg via INTRAVENOUS
  Filled 2024-05-04: qty 1

## 2024-05-04 MED ORDER — IPRATROPIUM-ALBUTEROL 0.5-2.5 (3) MG/3ML IN SOLN
3.0000 mL | Freq: Four times a day (QID) | RESPIRATORY_TRACT | Status: DC
  Administered 2024-05-04 (×4): 3 mL via RESPIRATORY_TRACT
  Filled 2024-05-04 (×4): qty 3

## 2024-05-04 MED ORDER — ENOXAPARIN SODIUM 80 MG/0.8ML IJ SOSY
0.5000 mg/kg | PREFILLED_SYRINGE | INTRAMUSCULAR | Status: DC
  Administered 2024-05-04: 80 mg via SUBCUTANEOUS
  Filled 2024-05-04 (×2): qty 0.8

## 2024-05-04 MED ORDER — AMLODIPINE BESYLATE 10 MG PO TABS
10.0000 mg | ORAL_TABLET | Freq: Every day | ORAL | Status: DC
  Administered 2024-05-04: 10 mg via ORAL
  Filled 2024-05-04 (×2): qty 1

## 2024-05-04 MED ORDER — METHYLPREDNISOLONE SODIUM SUCC 40 MG IJ SOLR
40.0000 mg | Freq: Two times a day (BID) | INTRAMUSCULAR | Status: DC
  Administered 2024-05-04 (×2): 40 mg via INTRAVENOUS
  Filled 2024-05-04 (×2): qty 1

## 2024-05-04 MED ORDER — INSULIN GLARGINE-YFGN 100 UNIT/ML ~~LOC~~ SOLN
50.0000 [IU] | SUBCUTANEOUS | Status: DC
  Administered 2024-05-05 – 2024-05-08 (×4): 50 [IU] via SUBCUTANEOUS
  Filled 2024-05-04 (×5): qty 0.5

## 2024-05-04 MED ORDER — FUROSEMIDE 10 MG/ML IJ SOLN
40.0000 mg | Freq: Two times a day (BID) | INTRAMUSCULAR | Status: DC
  Administered 2024-05-04 – 2024-05-05 (×2): 40 mg via INTRAVENOUS
  Filled 2024-05-04 (×2): qty 4

## 2024-05-04 MED ORDER — INSULIN ASPART 100 UNIT/ML IJ SOLN
0.0000 [IU] | Freq: Three times a day (TID) | INTRAMUSCULAR | Status: DC
  Administered 2024-05-04 (×2): 20 [IU] via SUBCUTANEOUS
  Administered 2024-05-05: 15 [IU] via SUBCUTANEOUS
  Administered 2024-05-05: 4 [IU] via SUBCUTANEOUS
  Administered 2024-05-05: 20 [IU] via SUBCUTANEOUS
  Administered 2024-05-06: 11 [IU] via SUBCUTANEOUS
  Administered 2024-05-06: 15 [IU] via SUBCUTANEOUS
  Filled 2024-05-04 (×2): qty 20
  Filled 2024-05-04: qty 15
  Filled 2024-05-04: qty 20
  Filled 2024-05-04: qty 1
  Filled 2024-05-04: qty 4
  Filled 2024-05-04: qty 15
  Filled 2024-05-04: qty 11

## 2024-05-04 MED ORDER — INSULIN ASPART 100 UNIT/ML IJ SOLN
15.0000 [IU] | Freq: Three times a day (TID) | INTRAMUSCULAR | Status: DC
  Administered 2024-05-05 – 2024-05-06 (×5): 15 [IU] via SUBCUTANEOUS
  Filled 2024-05-04 (×5): qty 15

## 2024-05-04 MED ORDER — SODIUM CHLORIDE 0.9 % IV SOLN
1.0000 g | Freq: Once | INTRAVENOUS | Status: AC
  Administered 2024-05-04: 1 g via INTRAVENOUS
  Filled 2024-05-04: qty 10

## 2024-05-04 MED ORDER — INSULIN ASPART 100 UNIT/ML IJ SOLN
0.0000 [IU] | Freq: Every day | INTRAMUSCULAR | Status: DC
  Administered 2024-05-04: 5 [IU] via SUBCUTANEOUS
  Filled 2024-05-04: qty 5

## 2024-05-04 MED ORDER — SODIUM CHLORIDE 0.9 % IV SOLN
500.0000 mg | Freq: Once | INTRAVENOUS | Status: AC
  Administered 2024-05-04: 500 mg via INTRAVENOUS
  Filled 2024-05-04: qty 5

## 2024-05-04 MED ORDER — INSULIN GLARGINE-YFGN 100 UNIT/ML ~~LOC~~ SOLN
30.0000 [IU] | SUBCUTANEOUS | Status: DC
  Administered 2024-05-04: 30 [IU] via SUBCUTANEOUS
  Filled 2024-05-04: qty 0.3

## 2024-05-04 MED ORDER — TIOTROPIUM BROMIDE 18 MCG IN CAPS: 1.0000 | ORAL_CAPSULE | Freq: Every morning | RESPIRATORY_TRACT | Status: DC

## 2024-05-04 MED ORDER — INSULIN ASPART 100 UNIT/ML IJ SOLN
5.0000 [IU] | Freq: Once | INTRAMUSCULAR | Status: AC
  Administered 2024-05-04: 5 [IU] via INTRAVENOUS
  Filled 2024-05-04: qty 5

## 2024-05-04 MED ORDER — INSULIN ASPART 100 UNIT/ML IJ SOLN
30.0000 [IU] | Freq: Once | INTRAMUSCULAR | Status: AC
  Administered 2024-05-04: 30 [IU] via SUBCUTANEOUS
  Filled 2024-05-04: qty 30

## 2024-05-04 MED ORDER — FUROSEMIDE 10 MG/ML IJ SOLN
60.0000 mg | Freq: Once | INTRAMUSCULAR | Status: AC
  Administered 2024-05-04: 60 mg via INTRAVENOUS
  Filled 2024-05-04: qty 8

## 2024-05-04 MED ORDER — IPRATROPIUM-ALBUTEROL 0.5-2.5 (3) MG/3ML IN SOLN
3.0000 mL | Freq: Three times a day (TID) | RESPIRATORY_TRACT | Status: DC
  Administered 2024-05-05 – 2024-05-08 (×10): 3 mL via RESPIRATORY_TRACT
  Filled 2024-05-04 (×10): qty 3

## 2024-05-04 MED ORDER — ASPIRIN 81 MG PO CHEW
324.0000 mg | CHEWABLE_TABLET | Freq: Once | ORAL | Status: AC
  Administered 2024-05-04: 324 mg via ORAL
  Filled 2024-05-04: qty 4

## 2024-05-04 MED ORDER — PERFLUTREN LIPID MICROSPHERE
1.0000 mL | INTRAVENOUS | Status: AC | PRN
  Administered 2024-05-04: 6 mL via INTRAVENOUS

## 2024-05-04 MED ORDER — FUROSEMIDE 10 MG/ML IJ SOLN
20.0000 mg | Freq: Two times a day (BID) | INTRAMUSCULAR | Status: DC
  Administered 2024-05-04: 20 mg via INTRAVENOUS
  Filled 2024-05-04: qty 2

## 2024-05-04 MED ADMIN — Apixaban Tab 5 MG: 5 mg | ORAL | NDC 00003089431

## 2024-05-04 MED ADMIN — Guaifenesin Tab ER 12HR 600 MG: 600 mg | ORAL | NDC 68084057211

## 2024-05-04 MED ADMIN — Cyclobenzaprine HCl Tab 10 MG: 10 mg | ORAL | NDC 60687055811

## 2024-05-04 MED ADMIN — Atorvastatin Calcium Tab 80 MG (Base Equivalent): 80 mg | ORAL | NDC 00904629304

## 2024-05-04 MED ADMIN — Fenofibrate Tab 160 MG: 160 mg | ORAL | NDC 42858066045

## 2024-05-04 MED ADMIN — Famotidine Tab 20 MG: 20 mg | ORAL | NDC 00904719361

## 2024-05-04 MED ADMIN — Pantoprazole Sodium EC Tab 40 MG (Base Equiv): 40 mg | ORAL | NDC 51079005101

## 2024-05-04 MED ADMIN — Fluoxetine HCl Cap 20 MG: 40 mg | ORAL | NDC 00904734661

## 2024-05-04 MED FILL — Apixaban Tab 5 MG: 5.0000 mg | ORAL | Qty: 1 | Status: AC

## 2024-05-04 MED FILL — Fluoxetine HCl Cap 20 MG: 40.0000 mg | ORAL | Qty: 2 | Status: AC

## 2024-05-04 MED FILL — Fenofibrate Tab 160 MG: 160.0000 mg | ORAL | Qty: 1 | Status: AC

## 2024-05-04 MED FILL — Famotidine Tab 20 MG: 20.0000 mg | ORAL | Qty: 1 | Status: AC

## 2024-05-04 MED FILL — Acetaminophen Tab 325 MG: 650.0000 mg | ORAL | Qty: 2 | Status: AC

## 2024-05-04 MED FILL — Pantoprazole Sodium EC Tab 40 MG (Base Equiv): 40.0000 mg | ORAL | Qty: 1 | Status: AC

## 2024-05-04 MED FILL — Cyclobenzaprine HCl Tab 10 MG: 10.0000 mg | ORAL | Qty: 1 | Status: AC

## 2024-05-04 MED FILL — Atorvastatin Calcium Tab 80 MG (Base Equivalent): 80.0000 mg | ORAL | Qty: 1 | Status: AC

## 2024-05-04 MED FILL — Guaifenesin Tab ER 12HR 600 MG: 600.0000 mg | ORAL | Qty: 1 | Status: AC

## 2024-05-04 MED FILL — Albuterol Sulfate Soln Nebu 0.083% (2.5 MG/3ML): 2.5000 mg | RESPIRATORY_TRACT | Qty: 3 | Status: AC

## 2024-05-04 MED FILL — Dextromethorphan-Guaifenesin Syrup 10-100 MG/5ML: 5.0000 mL | ORAL | Qty: 10 | Status: AC

## 2024-05-04 MED FILL — Umeclidinium Br Aero Powd Breath Act 62.5 MCG/ACT (Base Eq): 1.0000 | RESPIRATORY_TRACT | Qty: 7 | Status: AC

## 2024-05-04 NOTE — Evaluation (Addendum)
 Occupational Therapy Evaluation Patient Details Name: Zachary Deleon MRN: [id number] DOB: 1965-07-12 Today's Date: 05/04/2024   History of Present Illness   Zachary Deleon is a 58 y.o. male with COPD on 2 L oxygen at baseline with CHF, extensive tobacco abuse history, CAD, diabetes, carotid stenosis, hypertension, morbid obesity with a BMI of 45, who presents via EMS on 11/22 for respiratory distress.     Clinical Impressions Zachary Deleon was seen for OT evaluation this date. Prior to hospital admission, pt required assist for ADLs, SPC limited household ambulator. Pt lives with spouse and children, on 2L [place] with exertion baseline. Pt currently requires MIN A + SPC sit<>stand, son assists with sit>stand x2, pt pulls on hand, reports baseline. Walks ~5 ft with SPC. SpO2 93% on 4L [place] with activity, noted increased WoB. MOD A for LB access. Provided IS and flutter valve, educated on fq and ues. Pt would benefit from skilled OT to address noted impairments and functional limitations (see below for any additional details). Upon hospital discharge, recommend OT follow up.    If plan is discharge home, recommend the following:   Help with stairs or ramp for entrance;A little help with walking and/or transfers;A little help with bathing/dressing/bathroom     Functional Status Assessment   Patient has had a recent decline in their functional status and demonstrates the ability to make significant improvements in function in a reasonable and predictable amount of time.     Equipment Recommendations   None recommended by OT     Recommendations for Other Services         Precautions/Restrictions   Precautions Precautions: Fall Recall of Precautions/Restrictions: Intact Restrictions Weight Bearing Restrictions Per Provider Order: No     Mobility Bed Mobility               General bed mobility comments: not tested    Transfers Overall transfer level: Needs  assistance Equipment used: 1 person hand held assist, Straight cane Transfers: Sit to/from Stand Sit to Stand: Min assist           General transfer comment: son assists with sit>stand x2, pt pulls on hand, reports baseline. Walks ~5 ft with SPC.       Balance Overall balance assessment: Needs assistance Sitting-balance support: No upper extremity supported, Feet supported Sitting balance-Leahy Scale: Good     Standing balance support: Single extremity supported, During functional activity Standing balance-Leahy Scale: Fair                             ADL either performed or assessed with clinical judgement   ADL Overall ADL's : Needs assistance/impaired                                       General ADL Comments: MIN A + SPC for simulated BSC t/f. MOD A for LB access.     Vision         Perception         Praxis         Pertinent Vitals/Pain Pain Assessment Pain Assessment: No/denies pain     Extremity/Trunk Assessment Upper Extremity Assessment Upper Extremity Assessment: Right hand dominant;LUE deficits/detail LUE Deficits / Details: hx cva   Lower Extremity Assessment Lower Extremity Assessment: Generalized weakness       Communication Communication Communication: No apparent difficulties  Cognition Arousal: Alert Behavior During Therapy: WFL for tasks assessed/performed Cognition: No apparent impairments                               Following commands: Intact       Cueing  General Comments      SpO2 maintains 93-98% on 4L    Exercises     Shoulder Instructions      Home Living Family/patient expects to be discharged to:: Private residence Living Arrangements: Spouse/significant other;Children Available Help at Discharge: Family;Available 24 hours/day Type of Home: Mobile home Home Access: Stairs to enter     Home Layout: One level               Home Equipment: Cane - single  point;Wheelchair - manual;Wheelchair - power;Lift chair;Hospital bed          Prior Functioning/Environment Prior Level of Function : Needs assist             Mobility Comments: Hx of stroke, uses SPC ADLs Comments: assist for dressign and IADLs    OT Problem List: Decreased range of motion;Decreased activity tolerance   OT Treatment/Interventions: Self-care/ADL training;Therapeutic exercise;Energy conservation;DME and/or AE instruction;Therapeutic activities;Patient/family education      OT Goals(Current goals can be found in the care plan section)   Acute Rehab OT Goals Patient Stated Goal: to go home OT Goal Formulation: With patient/family Time For Goal Achievement: 05/18/24 Potential to Achieve Goals: Good ADL Goals Pt Will Perform Grooming: with modified independence;standing Pt Will Perform Lower Body Dressing: with min assist;with caregiver independent in assisting;sit to/from stand Pt Will Transfer to Toilet: with modified independence;ambulating;regular height toilet   OT Frequency:  Min 2X/week    Co-evaluation              AM-PAC OT 6 Clicks Daily Activity     Outcome Measure Help from another person eating meals?: None Help from another person taking care of personal grooming?: A Little Help from another person toileting, which includes using toliet, bedpan, or urinal?: A Little Help from another person bathing (including washing, rinsing, drying)?: A Lot Help from another person to put on and taking off regular upper body clothing?: A Little Help from another person to put on and taking off regular lower body clothing?: A Lot 6 Click Score: 17   End of Session Equipment Utilized During Treatment: Oxygen  Activity Tolerance: Patient tolerated treatment well;Patient limited by fatigue Patient left: in bed;with call bell/phone within reach;with family/visitor present  OT Visit Diagnosis: Other abnormalities of gait and mobility (R26.89);Muscle  weakness (generalized) (M62.81)                Time: 8649-8576 OT Time Calculation (min): 33 min Charges:  OT General Charges $OT Visit: 1 Visit OT Evaluation $OT Eval Low Complexity: 1 Low OT Treatments $Self Care/Home Management : 8-22 mins  Elston Slot, M.S. OTR/L  05/04/24, 4:40 PM  ascom [phone]

## 2024-05-04 NOTE — ED Notes (Addendum)
 Placed a condom cath on the pt per the RN Kathy's request. Pt anatomy was very inward making it a little difficult to place. If it fails, I explained to the pt that we would try the second option using the purewick. Pt was changed into a hospital gown and belongings placed into bag and given to the family. Pt also expressed frustration with the bipap mask and the doctor came in to reassure him that everything was fine. He also requested a drink but he is currently NPO . No other needs at this time

## 2024-05-04 NOTE — H&P (Signed)
 History and Physical    PatientSair Deleon FMW:[id number] DOB: [birth year] DOA: 05/03/2024 DOS: the patient was seen and examined on 05/04/2024 PCP: [name], NP  Patient coming from: Home  Chief Complaint:  Chief Complaint  Patient presents with   Respiratory Distress   HPI: Zachary Deleon is a 58 y.o. male with COPD on 2 L oxygen at baseline with CHF, extensive tobacco abuse history, CAD, diabetes, carotid stenosis, hypertension, morbid obesity with a BMI of 45, who presents via EMS on 11/22 for respiratory distress.  He reports he has had progressive dyspnea over the last 3 weeks as well as a cough.  It acutely worsened over the 24 hours prior to arrival.  On EMS arrival he was 78% on home 2 L and was immediately placed on CPAP.  With EMS he was given 125 Solu-Medrol, 1 DuoNeb, 1 g magnesium. On arrival to the ED he was noted to have elevated lactic acid as well as elevated troponin and BNP.  CXR concerning for pulmonary edema.  He received IV Lasix.  At the time my evaluation he is on nasal cannula.  He is incredibly dyspneic when speaking and persistently tachypneic.  He reports that he is feeling much better and he feels his respiratory status is currently at baseline.  He denies any current chest pain but reports he has had intermittent chest pain at home.  He reports he sees multiple doctors and is on multiple medications but he is unable to provide additional information as to who he sees and what he takes.  He reports his wife is his point of contact.  He currently resides with his wife and 2 adult children   *Extensive chart review was performed including review of patient's alternative record  Review of Systems: As mentioned in the history of present illness. All other systems reviewed and are negative. Past Medical History:  Diagnosis Date   CHF (congestive heart failure) (HCC)    COPD (chronic obstructive pulmonary disease) (HCC)    Diabetes mellitus without  complication (HCC)    Stroke (HCC)    Social history: - Tobacco: 3-4 packs/day x 40 years, quit 2 years ago - Denies illicit drugs - Scant alcohol use  Allergies  Allergen Reactions   Morphine And Codeine    Sulfa Antibiotics Hives    No family history on file.  Prior to Admission medications   Medication Sig Start Date End Date Taking? Authorizing Provider  acetaminophen (TYLENOL) 325 MG tablet Take 650 mg by mouth every 6 (six) hours as needed.   Yes [provider]  albuterol (VENTOLIN HFA) 108 (90 Base) MCG/ACT inhaler Inhale 2 puffs into the lungs every 6 (six) hours as needed for wheezing or shortness of breath.   Yes [provider]  amLODipine (NORVASC) 10 MG tablet Take 10 mg by mouth at bedtime.   Yes [provider]  apixaban (ELIQUIS) 5 MG TABS tablet Take 5 mg by mouth 2 (two) times daily.   Yes [provider]  atorvastatin (LIPITOR) 80 MG tablet Take 80 mg by mouth daily.   Yes [provider]  cyclobenzaprine (FLEXERIL) 10 MG tablet Take 10 mg by mouth 2 (two) times daily.   Yes [provider]  famotidine (PEPCID) 20 MG tablet Take 20 mg by mouth 2 (two) times daily.   Yes [provider]  fenofibrate 160 MG tablet Take 160 mg by mouth every morning.   Yes [provider]  FLUoxetine (PROZAC) 40  MG capsule Take 40 mg by mouth in the morning.   Yes [provider]  fluticasone -salmeterol (ADVAIR DISKUS) 250-50 MCG/ACT AEPB Inhale 1 puff into the lungs in the morning and at bedtime.   Yes [provider]  insulin  aspart (NOVOLOG ) 100 UNIT/ML injection Inject 20 Units into the skin 3 (three) times daily before meals.   Yes [provider]  insulin  glargine, 1 Unit Dial, (TOUJEO  SOLOSTAR) 300 UNIT/ML Solostar Pen Inject 65 Units into the skin daily.   Yes [provider]  linagliptin (TRADJENTA) 5 MG TABS tablet Take 5 mg by mouth in the morning.   Yes [provider]  loratadine (CLARITIN) 10 MG tablet Take 10 mg by mouth in the morning.   Yes [provider]  losartan (COZAAR) 50 MG tablet Take 75 mg by mouth in the morning.   Yes [provider]  metFORMIN (GLUCOPHAGE) 500 MG tablet Take 1,000 mg by mouth 2 (two) times daily with a meal.   Yes [provider]  Multiple Vitamin (MULTIVITAMIN WITH MINERALS) TABS tablet Take 1 tablet by mouth in the morning.   Yes [provider]  pantoprazole  (PROTONIX ) 40 MG tablet Take 40 mg by mouth in the morning.   Yes [provider]  pregabalin  (LYRICA ) 75 MG capsule Take 75 mg by mouth 3 (three) times daily.   Yes [provider]  sennosides-docusate sodium (SENOKOT-S) 8.6-50 MG tablet Take 1 tablet by mouth daily.   Yes [provider]  Tiotropium Bromide  (SPIRIVA HANDIHALER) 18 MCG CAPS Place 1 capsule into inhaler and inhale in the morning.   Yes [provider]    Physical Exam: Vitals:   05/04/24 0300 05/04/24 0545 05/04/24 0614 05/04/24 0715  BP: (!) 158/129 125/75  139/89  Pulse: 98 100  94  Resp: 18 (!) 23  20  Temp:   98.4 F (36.9 C)   TempSrc:   Oral   SpO2: 99% 99%  98%  Weight:      Height:       Constitutional:  Normal appearance.  Severe obesity HENT: Head Normocephalic and atraumatic.  Mucous membranes are moist.  Eyes:  Extraocular intact. Conjunctivae normal. Pupils are equal, round, and reactive to light.  Cardiovascular: Rate and Rhythm: Normal rate and regular rhythm.  Pulmonary: Tachypneic, dyspneic when speaking, on 3 L [place].  Significant work of breathing.  Poor aeration bilaterally, some rales, end expiratory wheeze Musculoskeletal:  Normal range of motion.  1+ pedal edema Skin: warm and dry. not jaundiced.  Neurological: No focal deficit present. alert. Oriented. Psychiatric: Mood and Affect congruent.   Data Reviewed:    Latest Ref Rng & Units 05/04/2024    5:35 AM 05/03/2024   10:57 PM   CBC  WBC 4.0 - 10.5 K/uL 8.3  12.1   Hemoglobin 13.0 - 17.0 g/dL 8.5  9.0   Hematocrit 60.9 - 52.0 % 27.2  29.3   Platelets 150 - 400 K/uL 98  134       Latest Ref Rng & Units 05/04/2024    5:35 AM 05/03/2024   10:57 PM  BMP  Glucose 70 - 99 mg/dL 552  669   BUN 6 - 20 mg/dL 25  19   Creatinine 9.38 - 1.24 mg/dL 8.40  8.52   Sodium 864 - 145 mmol/L 130  131   Potassium 3.5 - 5.1 mmol/L 4.6  4.1   Chloride 98 - 111 mmol/L 95  95   CO2  22 - 32 mmol/L 20  20   Calcium  8.9 - 10.3 mg/dL 7.9  8.2    DG Chest Portable 1 View EXAM: 1 VIEW(S) XRAY OF THE CHEST 05/03/2024 11:19:00 PM  COMPARISON: None available.  CLINICAL HISTORY: shortness of breath  FINDINGS:  LUNGS AND PLEURA: Elevated right hemidiaphragm and low lung volumes. Mild pulmonary edema. No pleural effusion. No pneumothorax.  HEART AND MEDIASTINUM: Cardiomegaly.  BONES AND SOFT TISSUES: No acute osseous abnormality.  IMPRESSION: 1. Mild pulmonary edema. 2. Cardiomegaly.  Electronically signed by: Oneil Devonshire MD 05/03/2024 11:26 PM EST RP Workstation: [id number]    Assessment and Plan: Acute on chronic respiratory failure - At baseline patient is on 2 L O2.  Desatting to 78% on EMS arrival with associated tachypnea and tachycardia.  Required CPAP via EMS and then BiPAP in the ED - Suspect this is multifactorial.  Patient has known COPD, also suspect this is CHF exacerbation.  BMI of 45, certainly some component of OHS. - For now we will treat for all of the above.  See below for further detail - Received treatment for COPD exacerbation and CHF exacerbation in ED - Patient reports significant improvement after DuoNebs and steroids - CXR with significant pulmonary edema, has been started on IV Lasix . - Currently on 3 L [place].  Patient reports he feels like he is near his respiratory baseline.  If this is true, he is significantly decompensated at baseline.  PT/OT evals - Continue monitoring on  telemetry  COPD, with current exacerbation - Required BiPAP on arrival, now down to 2 to 3 L - Status post 125 Solu-Medrol  1 g magnesium - Continue with Methylpred, DuoNebs, Mucinex  - No leukocytosis or fever.  Pulmonary edema on CXR can obscure some pneumonia.  Currently on antibiotics, will continue for now - RVP negative  Lactic acidosis - 3.9 on arrival, downtrended to 2.4.  Did not receive IV fluids given pulmonary edema.  Acidosis likely secondary to respiratory distress.  Improving now. - VBG pH 7.32  Hyponatremia - Sodium 130, likely hypervolemic given evidence of volume overload - Continue Lasix , expect improvement.  AKI - Unclear baseline.  Suspect he may actually have CKD.  Most recent creatinine available for review is 2022 at that time creatinine was 1.0 - Creatinine on arrival 1.47, uptrending to 1.59 - Continue to monitor closely. Expect worsening with ongoing Lasix  - Renally dose when needed - Strict I's and O's  Elevated troponin - 180 on arrival, essentially no change on repeat, 181. - Doubt ACS.  Patient denies any chest pain.  Reports his chest pressure is significantly received relieved after COPD treatment.  He does endorse intermittent chest pain outpatient.  Given his medical history I do suspect he will have underlying CAD - Will likely require cardiology consult.  Obtain echocardiogram first - EKG on arrival without evidence of STEMI - Troponin elevation may be secondary to heart failure exacerbation  CHF - Patient carries this diagnosis but there is no recent echocardiogram available for review. - Pulmonary edema on CXR, lower extremity edema, proBNP 1811. - Ordered  Echo - IV Lasix , strict I's and O's  Uncontrolled diabetes, with hyperglycemia and peripheral neuropathy  - Hemoglobin A1c 10.6% - initiate on resistant sliding scale insulin .  Takes high-dose insulin  20 units aspart 3 times daily at home with 65 units glargine daily - Monitor CBGs  closely, titrate as needed - Hold home metformin and Tradjenta while admitted - Consult diabetes educator for dietary education  Anemia -  Hemoglobin 9.0, continue to trend CBC.  Expect will improve with diuresis.  CAD Carotid stenosis - Echo as above - Likely cardiology consult for results of echocardiogram  Severe obesity BMI 45 - Outpatient follow up for lifestyle modification and risk factor management - Complicates all of the above.  Suspect some degree of obesity hypoventilation syndrome  Depression - Continue home meds  Hypertension - Continue home meds  OSA - CPAP at night  History of right MCA CVA - Continue statin - Residual left-sided hemiparesis   Diabetic neuropathy - Continue home dose Lyrica   History of lower limb ischemia - On Eliquis , will continue  History of DVT History of PE - Continue Eliquis   GERD - Continue home meds   Advance Care Planning:   Code Status: Full Code   Consults:   Family Communication: No family at bedside.  Discussed directly with patient.  Severity of Illness: The appropriate patient status for this patient is INPATIENT. Inpatient status is judged to be reasonable and necessary in order to provide the required intensity of service to ensure the patient's safety. The patient's presenting symptoms, physical exam findings, and initial radiographic and laboratory data in the context of their chronic comorbidities is felt to place them at high risk for further clinical deterioration. Furthermore, it is not anticipated that the patient will be medically stable for discharge from the hospital within 2 midnights of admission.   * I certify that at the point of admission it is my clinical judgment that the patient will require inpatient hospital care spanning beyond 2 midnights from the point of admission due to high intensity of service, high risk for further deterioration and high frequency of surveillance  required.*  Author: Zerrick Hanssen, DO 05/04/2024 8:00 AM  For on call review [email or web address].

## 2024-05-04 NOTE — Progress Notes (Signed)
  Echocardiogram 2D Echocardiogram has been performed. Definity  IV ultrasound imaging agent used on this study.  Zachary Deleon 05/04/2024, 11:19 AM

## 2024-05-04 NOTE — Progress Notes (Signed)
 PHARMACY - PHYSICIAN COMMUNICATION CRITICAL VALUE ALERT - BLOOD CULTURE IDENTIFICATION (BCID)  Zachary Deleon is an 58 y.o. male who presented to [facility] on 05/03/2024 with a chief complaint of respiratory distress.  Assessment:  1/4 bottles (anaerobic) GPC. BCID showed staph species (no further identification)  (include suspected source if known)  Name of physician (or Provider) Contacted: Dezii  Current antibiotics: Doxycycline  100 mg PO BID  Changes to prescribed antibiotics recommended:  Likely a contaminant, MD with no concern for infection. Will keep antibiotics the same.  Results for orders placed or performed during the hospital encounter of 05/03/24  Blood Culture ID Panel (Reflexed) (Collected: 05/03/2024 10:58 PM)  Result Value Ref Range   Enterococcus faecalis NOT DETECTED NOT DETECTED   Enterococcus Faecium NOT DETECTED NOT DETECTED   Listeria monocytogenes NOT DETECTED NOT DETECTED   Staphylococcus species DETECTED (A) NOT DETECTED   Staphylococcus aureus (BCID) NOT DETECTED NOT DETECTED   Staphylococcus epidermidis NOT DETECTED NOT DETECTED   Staphylococcus lugdunensis NOT DETECTED NOT DETECTED   Streptococcus species NOT DETECTED NOT DETECTED   Streptococcus agalactiae NOT DETECTED NOT DETECTED   Streptococcus pneumoniae NOT DETECTED NOT DETECTED   Streptococcus pyogenes NOT DETECTED NOT DETECTED   A.calcoaceticus-baumannii NOT DETECTED NOT DETECTED   Bacteroides fragilis NOT DETECTED NOT DETECTED   Enterobacterales NOT DETECTED NOT DETECTED   Enterobacter cloacae complex NOT DETECTED NOT DETECTED   Escherichia coli NOT DETECTED NOT DETECTED   Klebsiella aerogenes NOT DETECTED NOT DETECTED   Klebsiella oxytoca NOT DETECTED NOT DETECTED   Klebsiella pneumoniae NOT DETECTED NOT DETECTED   Proteus species NOT DETECTED NOT DETECTED   Salmonella species NOT DETECTED NOT DETECTED   Serratia marcescens NOT DETECTED NOT DETECTED   Haemophilus influenzae NOT DETECTED  NOT DETECTED   Neisseria meningitidis NOT DETECTED NOT DETECTED   Pseudomonas aeruginosa NOT DETECTED NOT DETECTED   Stenotrophomonas maltophilia NOT DETECTED NOT DETECTED   Candida albicans NOT DETECTED NOT DETECTED   Candida auris NOT DETECTED NOT DETECTED   Candida glabrata NOT DETECTED NOT DETECTED   Candida krusei NOT DETECTED NOT DETECTED   Candida parapsilosis NOT DETECTED NOT DETECTED   Candida tropicalis NOT DETECTED NOT DETECTED   Cryptococcus neoformans/gattii NOT DETECTED NOT DETECTED    Lum VEAR Mania, PharmD 05/04/2024  3:55 PM

## 2024-05-04 NOTE — ED Notes (Signed)
 Advised nurse that patient has ready bed

## 2024-05-04 NOTE — ED Provider Notes (Signed)
 Took over care from Dr. Nicholaus.  Blood work is notable for elevated lactic acid level.  Additionally troponin and BNP elevated.  Chest x-ray concerning for pulmonary edema.  At this time I do think pulmonary edema and heart failure because of the patient's symptoms.  Did write for IV Lasix.  Would have concern for possible an STEMI given elevated troponin however repeat troponin without significant change.  Do wonder if troponin elevation is secondary to heart failure.  In terms of the elevated lactic acid level currently less than 4.  Given concern for respiratory distress secondary to pulmonary edema will hold on IV fluids at this time.  Did write for antibiotics given concern for possible infection.  Will plan on admission.  Discussed results with patient and family.  CRITICAL CARE Performed by: Guadalupe Eagles   Total critical care time: 30 minutes  Critical care time was exclusive of separately billable procedures and treating other patients.  Critical care was necessary to treat or prevent imminent or life-threatening deterioration.  Critical care was time spent personally by me on the following activities: development of treatment plan with patient and/or surrogate as well as nursing, discussions with consultants, evaluation of patient's response to treatment, examination of patient, obtaining history from patient or surrogate, ordering and performing treatments and interventions, ordering and review of laboratory studies, ordering and review of radiographic studies, pulse oximetry and re-evaluation of patient's condition.    Eagles Guadalupe, MD 05/04/24 [phone]

## 2024-05-04 NOTE — ED Notes (Signed)
 Reported BG of [phone] to VEAR Solian M.D., awaiting orders.

## 2024-05-04 NOTE — Progress Notes (Signed)
 Anticoagulation monitoring(Lovenox ):  58 yo male ordered Lovenox  40 mg Q24h    Filed Weights   05/03/24 2258  Weight: (!) 159.7 kg (352 lb)   BMI 45.2     Lab Results  Component Value Date   CREATININE 1.47 (H) 05/03/2024   Estimated Creatinine Clearance: 86.6 mL/min (A) (by C-G formula based on SCr of 1.47 mg/dL (H)). Hemoglobin & Hematocrit     Component Value Date/Time   HGB 9.0 (L) 05/03/2024 2257   HCT 29.3 (L) 05/03/2024 2257     Per Protocol for Patient with estCrcl > 30 ml/min and BMI > 30, will transition to Lovenox  80 mg Q24h.

## 2024-05-05 ENCOUNTER — Encounter: Payer: Self-pay | Admitting: Obstetrics and Gynecology

## 2024-05-05 ENCOUNTER — Other Ambulatory Visit (HOSPITAL_COMMUNITY): Payer: Self-pay

## 2024-05-05 ENCOUNTER — Telehealth (HOSPITAL_COMMUNITY): Payer: Self-pay | Admitting: Pharmacy Technician

## 2024-05-05 DIAGNOSIS — Z86711 Personal history of pulmonary embolism: Secondary | ICD-10-CM | POA: Insufficient documentation

## 2024-05-05 DIAGNOSIS — Z8673 Personal history of transient ischemic attack (TIA), and cerebral infarction without residual deficits: Secondary | ICD-10-CM

## 2024-05-05 DIAGNOSIS — I5020 Unspecified systolic (congestive) heart failure: Secondary | ICD-10-CM | POA: Insufficient documentation

## 2024-05-05 DIAGNOSIS — I5023 Acute on chronic systolic (congestive) heart failure: Secondary | ICD-10-CM

## 2024-05-05 DIAGNOSIS — G4733 Obstructive sleep apnea (adult) (pediatric): Secondary | ICD-10-CM | POA: Insufficient documentation

## 2024-05-05 DIAGNOSIS — J449 Chronic obstructive pulmonary disease, unspecified: Secondary | ICD-10-CM | POA: Insufficient documentation

## 2024-05-05 DIAGNOSIS — E66813 Obesity, class 3: Secondary | ICD-10-CM | POA: Insufficient documentation

## 2024-05-05 DIAGNOSIS — G40909 Epilepsy, unspecified, not intractable, without status epilepticus: Secondary | ICD-10-CM

## 2024-05-05 DIAGNOSIS — E119 Type 2 diabetes mellitus without complications: Secondary | ICD-10-CM

## 2024-05-05 DIAGNOSIS — I739 Peripheral vascular disease, unspecified: Secondary | ICD-10-CM | POA: Insufficient documentation

## 2024-05-05 DIAGNOSIS — Z9889 Other specified postprocedural states: Secondary | ICD-10-CM

## 2024-05-05 DIAGNOSIS — I10 Essential (primary) hypertension: Secondary | ICD-10-CM | POA: Insufficient documentation

## 2024-05-05 DIAGNOSIS — Z86718 Personal history of other venous thrombosis and embolism: Secondary | ICD-10-CM

## 2024-05-05 LAB — CBC WITH DIFFERENTIAL/PLATELET
Abs Immature Granulocytes: 0.15 K/uL — ABNORMAL HIGH (ref 0.00–0.07)
Basophils Absolute: 0 K/uL (ref 0.0–0.1)
Basophils Relative: 0 %
Eosinophils Absolute: 0 K/uL (ref 0.0–0.5)
Eosinophils Relative: 0 %
HCT: 29.4 % — ABNORMAL LOW (ref 39.0–52.0)
Hemoglobin: 9 g/dL — ABNORMAL LOW (ref 13.0–17.0)
Immature Granulocytes: 1 %
Lymphocytes Relative: 6 %
Lymphs Abs: 0.9 K/uL (ref 0.7–4.0)
MCH: 25 pg — ABNORMAL LOW (ref 26.0–34.0)
MCHC: 30.6 g/dL (ref 30.0–36.0)
MCV: 81.7 fL (ref 80.0–100.0)
Monocytes Absolute: 1.4 K/uL — ABNORMAL HIGH (ref 0.1–1.0)
Monocytes Relative: 9 %
Neutro Abs: 13.2 K/uL — ABNORMAL HIGH (ref 1.7–7.7)
Neutrophils Relative %: 84 %
Platelets: 240 K/uL (ref 150–400)
RBC: 3.6 MIL/uL — ABNORMAL LOW (ref 4.22–5.81)
RDW: 18.2 % — ABNORMAL HIGH (ref 11.5–15.5)
WBC: 15.6 K/uL — ABNORMAL HIGH (ref 4.0–10.5)
nRBC: 0.3 % — ABNORMAL HIGH (ref 0.0–0.2)

## 2024-05-05 LAB — COMPREHENSIVE METABOLIC PANEL WITH GFR
ALT: 42 U/L (ref 0–44)
AST: 55 U/L — ABNORMAL HIGH (ref 15–41)
Albumin: 4.5 g/dL (ref 3.5–5.0)
Alkaline Phosphatase: 68 U/L (ref 38–126)
Anion gap: 14 (ref 5–15)
BUN: 36 mg/dL — ABNORMAL HIGH (ref 6–20)
CO2: 23 mmol/L (ref 22–32)
Calcium: 8.4 mg/dL — ABNORMAL LOW (ref 8.9–10.3)
Chloride: 96 mmol/L — ABNORMAL LOW (ref 98–111)
Creatinine, Ser: 1.59 mg/dL — ABNORMAL HIGH (ref 0.61–1.24)
GFR, Estimated: 50 mL/min — ABNORMAL LOW (ref >60)
Glucose, Bld: 401 mg/dL — ABNORMAL HIGH (ref 70–99)
Potassium: 4.8 mmol/L (ref 3.5–5.1)
Sodium: 133 mmol/L — ABNORMAL LOW (ref 135–145)
Total Bilirubin: 0.6 mg/dL (ref 0.0–1.2)
Total Protein: 7.8 g/dL (ref 6.5–8.1)

## 2024-05-05 LAB — GLUCOSE, CAPILLARY
Glucose-Capillary: 391 mg/dL — ABNORMAL HIGH (ref 70–99)
Glucose-Capillary: 314 mg/dL — ABNORMAL HIGH (ref 70–99)
Glucose-Capillary: 193 mg/dL — ABNORMAL HIGH (ref 70–99)
Glucose-Capillary: 134 mg/dL — ABNORMAL HIGH (ref 70–99)

## 2024-05-05 LAB — TECHNOLOGIST SMEAR REVIEW

## 2024-05-05 LAB — CBC
HCT: 28.2 % — ABNORMAL LOW (ref 39.0–52.0)
Hemoglobin: 9 g/dL — ABNORMAL LOW (ref 13.0–17.0)
MCH: 25.6 pg — ABNORMAL LOW (ref 26.0–34.0)
MCHC: 31.9 g/dL (ref 30.0–36.0)
MCV: 80.3 fL (ref 80.0–100.0)
Platelets: 175 K/uL (ref 150–400)
RBC: 3.51 MIL/uL — ABNORMAL LOW (ref 4.22–5.81)
RDW: 18.4 % — ABNORMAL HIGH (ref 11.5–15.5)
WBC: 11.4 K/uL — ABNORMAL HIGH (ref 4.0–10.5)
nRBC: 0.3 % — ABNORMAL HIGH (ref 0.0–0.2)

## 2024-05-05 LAB — IRON AND TIBC
Iron: 40 ug/dL — ABNORMAL LOW (ref 45–182)
Saturation Ratios: 8 % — ABNORMAL LOW (ref 17.9–39.5)
TIBC: 515 ug/dL — ABNORMAL HIGH (ref 250–450)
UIBC: 475 ug/dL

## 2024-05-05 LAB — FERRITIN: Ferritin: 52 ng/mL (ref 24–336)

## 2024-05-05 MED ORDER — CARVEDILOL 3.125 MG PO TABS
3.1250 mg | ORAL_TABLET | Freq: Two times a day (BID) | ORAL | Status: DC
  Administered 2024-05-05 – 2024-05-06 (×2): 3.125 mg via ORAL
  Filled 2024-05-05 (×2): qty 1

## 2024-05-05 MED ORDER — FUROSEMIDE 10 MG/ML IJ SOLN
40.0000 mg | Freq: Two times a day (BID) | INTRAMUSCULAR | Status: DC
  Administered 2024-05-05: 40 mg via INTRAVENOUS
  Filled 2024-05-05: qty 4

## 2024-05-05 MED ORDER — DAPAGLIFLOZIN PROPANEDIOL 5 MG PO TABS
5.0000 mg | ORAL_TABLET | Freq: Every day | ORAL | Status: DC
  Administered 2024-05-05 – 2024-05-06 (×2): 5 mg via ORAL
  Filled 2024-05-05 (×2): qty 1

## 2024-05-05 MED ADMIN — Apixaban Tab 5 MG: 5 mg | ORAL | NDC 00003089431

## 2024-05-05 MED ADMIN — Guaifenesin Tab ER 12HR 600 MG: 600 mg | ORAL | NDC 68084057211

## 2024-05-05 MED ADMIN — Cyclobenzaprine HCl Tab 10 MG: 10 mg | ORAL | NDC 60687055811

## 2024-05-05 MED ADMIN — Pregabalin Cap 75 MG: 75 mg | ORAL | NDC 60687049511

## 2024-05-05 MED ADMIN — Spironolactone Tab 25 MG: 25 mg | ORAL | NDC 60687046511

## 2024-05-05 MED ADMIN — Umeclidinium Br Aero Powd Breath Act 62.5 MCG/ACT (Base Eq): 1 | RESPIRATORY_TRACT | NDC 00173087306

## 2024-05-05 MED ADMIN — Atorvastatin Calcium Tab 80 MG (Base Equivalent): 80 mg | ORAL | NDC 00904629304

## 2024-05-05 MED ADMIN — Fluticasone Furoate-Vilanterol Aero Powd BA 200-25 MCG/ACT: 1 | RESPIRATORY_TRACT | NDC 00173088214

## 2024-05-05 MED ADMIN — Fenofibrate Tab 160 MG: 160 mg | ORAL | NDC 42858066045

## 2024-05-05 MED ADMIN — Famotidine Tab 20 MG: 20 mg | ORAL | NDC 00904719361

## 2024-05-05 MED ADMIN — Pantoprazole Sodium EC Tab 40 MG (Base Equiv): 40 mg | ORAL | NDC 51079005101

## 2024-05-05 MED ADMIN — Fluoxetine HCl Cap 20 MG: 40 mg | ORAL | NDC 00904734661

## 2024-05-05 MED ADMIN — Albuterol Sulfate Soln Nebu 0.083% (2.5 MG/3ML): 2.5 mg | RESPIRATORY_TRACT | NDC 00378827031

## 2024-05-05 MED ADMIN — Acetaminophen Tab 325 MG: 650 mg | ORAL | NDC 00904677361

## 2024-05-05 MED FILL — Pregabalin Cap 75 MG: 75.0000 mg | ORAL | Qty: 1 | Status: AC

## 2024-05-05 MED FILL — Fluticasone Furoate-Vilanterol Aero Powd BA 200-25 MCG/ACT: 1.0000 | RESPIRATORY_TRACT | Qty: 28 | Status: AC

## 2024-05-05 MED FILL — Spironolactone Tab 25 MG: 25.0000 mg | ORAL | Qty: 1 | Status: AC

## 2024-05-05 MED FILL — Zolpidem Tartrate Tab 5 MG: 10.0000 mg | ORAL | Qty: 2 | Status: AC

## 2024-05-05 NOTE — Progress Notes (Addendum)
 PROGRESS NOTE    Zachary Deleon  FMW:[id number] DOB: 08-08-1965 DOA: 05/03/2024 PCP: [name], NP  Outpatient Specialists: none ([facility])    Brief Narrative:   Zachary Deleon is a 58 y.o. male with COPD on 2 L oxygen at baseline with CHF, extensive tobacco abuse history, CAD, diabetes, carotid stenosis, hypertension, morbid obesity with a BMI of 45, who presents via EMS on 11/22 for respiratory distress.  He reports he has had progressive dyspnea over the last 3 weeks as well as a cough.  It acutely worsened over the 24 hours prior to arrival.  On EMS arrival he was 78% on home 2 L and was immediately placed on CPAP.  With EMS he was given 125 Solu-Medrol , 1 DuoNeb, 1 g magnesium. On arrival to the ED he was noted to have elevated lactic acid as well as elevated troponin and BNP.  CXR concerning for pulmonary edema.  He received IV Lasix .   At the time my evaluation he is on nasal cannula.  He is incredibly dyspneic when speaking and persistently tachypneic.  He reports that he is feeling much better and he feels his respiratory status is currently at baseline.  He denies any current chest pain but reports he has had intermittent chest pain at home.   He reports he sees multiple doctors and is on multiple medications but he is unable to provide additional information as to who he sees and what he takes.  He reports his wife is his point of contact.  He currently resides with his wife and 2 adult children    Assessment & Plan:   Principal Problem:   CHF exacerbation (HCC) Active Problems:   Obesity, Class III, BMI 40-49.9 (morbid obesity) (HCC)   History of DVT (deep vein thrombosis)   History of pulmonary embolus (PE)   Seizure disorder (HCC)   History of CVA (cerebrovascular accident)   T2DM (type 2 diabetes mellitus) (HCC)   COPD (chronic obstructive pulmonary disease) (HCC)   Essential hypertension   PAD (peripheral artery disease)   History of CEA (carotid  endarterectomy)   Heart failure with mid-range ejection fraction (HFmEF) (HCC)   OSA (obstructive sleep apnea)  # HFmid-rangeEF with exacerbation Normal EF in 2019, here 40-45, mild RV dysfunction. Complaining of several days progressive dyspnea on exertion. Mild flat trop elevation 180s, likely demand. Out 3 liters yesterday - start coreg  3.125 - start spiro 12.5 - start farxiga  5 - resume home losartan when kidney function stabilizes or improves - continue lasix  40 IV bid  # Acute hypoxic respiratory failure Required bipap initially, now weaned to 3 liters. Is on prn o2 at home -  O2, wean as able  # AKI vs progression to ckd Cr 1.5s, was around 1 last year - monitor while diuresing  # Anemia Borderline microcytic. Denies bleeding - f/u iron panel, ferritin - f/u smear  # Thrombocytopenia Chronic low 100s. Hiv neg - f/u smear, hcv - monitor  # COPD Think symptoms are largely driven by chf, not copd exacerbation - d/c abx and steroids - monitor - breo for home advair, cont home spiriva  # T2DM Poor control A1c 10.6. hyperglycemic - continue glargine 50 and rapid 15 with meals, and ssi - holding home metformin and tradjenta - starting farxiga  as above - would also benefit from glp-1  # Neuropathy - home gabapentin  # History CVA - home apixaban , statin  # History DVT/PE - cont home apixaban   # History  seizure - doesn't appear currently prescribed anti-epileptic  # Morbid obesity Contributes to underlying health problems - would benefit from glp-1  # OSA Not treated - outpt f/u  # PAD # CAS History CEA in 2019 - home apixaban , statin  # Debility - PT consult   DVT prophylaxis: apixaban  Code Status: dnr/dni, confirmed on 11/24 with wife present Family Communication: wife at bedside 11/24  Level of care: Progressive Status is: Inpatient Remains inpatient appropriate because: severity of illness    Consultants:   none  Procedures: none  Antimicrobials:  none    Subjective: Reports improvement in dyspnea. No chest pain  Objective: Vitals:   05/05/24 0400 05/05/24 0522 05/05/24 0706 05/05/24 0755  BP:    (!) 145/83  Pulse:    (!) 108  Resp:    18  Temp:    97.7 F (36.5 C)  TempSrc:      SpO2: 94% 96% 94% 92%  Weight:      Height:        Intake/Output Summary (Last 24 hours) at 05/05/2024 0935 Last data filed at 05/05/2024 0800 Gross per 24 hour  Intake --  Output 4150 ml  Net -4150 ml   Filed Weights   05/03/24 2258 05/05/24 0347  Weight: (!) 159.7 kg (!) 158.7 kg    Examination:  General exam: Appears calm and comfortable  Respiratory system: Clear to auscultation save for basilar rales Cardiovascular system: S1 & S2 heard, RRR. Distant heart sounds Gastrointestinal system: Abdomen is obese, soft and nontender.   Central nervous system: Alert and oriented. No focal neurological deficits. Extremities: Symmetric 5 x 5 power. Pitting edema to thighs Skin: No rashes, lesions or ulcers Psychiatry: Judgement and insight appear normal. Mood & affect appropriate.     Data Reviewed: I have personally reviewed following labs and imaging studies  CBC: Recent Labs  Lab 05/03/24 2257 05/04/24 0535 05/05/24 0322  WBC 12.1* 8.3 11.4*  NEUTROABS 8.7*  --   --   HGB 9.0* 8.5* 9.0*  HCT 29.3* 27.2* 28.2*  MCV 81.6 80.5 80.3  PLT 134* 98* 175   Basic Metabolic Panel: Recent Labs  Lab 05/03/24 2257 05/04/24 0535 05/05/24 0322  NA 131* 130* 133*  K 4.1 4.6 4.8  CL 95* 95* 96*  CO2 20* 20* 23  GLUCOSE 330* 447* 401*  BUN 19 25* 36*  CREATININE 1.47* 1.59* 1.59*  CALCIUM  8.2* 7.9* 8.4*   GFR: Estimated Creatinine Clearance: 79.8 mL/min (A) (by C-G formula based on SCr of 1.59 mg/dL (H)). Liver Function Tests: Recent Labs  Lab 05/03/24 2257 05/05/24 0322  AST 51* 55*  ALT 41 42  ALKPHOS 66 68  BILITOT 0.6 0.6  PROT 7.4 7.8  ALBUMIN 4.2 4.5   Recent Labs   Lab 05/03/24 2257  LIPASE 22   No results for input(s): AMMONIA in the last 168 hours. Coagulation Profile: No results for input(s): INR, PROTIME in the last 168 hours. Cardiac Enzymes: No results for input(s): CKTOTAL, CKMB, CKMBINDEX, TROPONINI in the last 168 hours. BNP (last 3 results) Recent Labs    05/03/24 2257  PROBNP 1,811.0*   HbA1C: Recent Labs    05/03/24 2257  HGBA1C 10.6*   CBG: Recent Labs  Lab 05/04/24 0840 05/04/24 1235 05/04/24 1641 05/04/24 2129 05/05/24 0753  GLUCAP 397* 434* 360* 380* 391*   Lipid Profile: No results for input(s): CHOL, HDL, LDLCALC, TRIG, CHOLHDL, LDLDIRECT in the last 72 hours. Thyroid Function Tests: No results for input(s):  TSH, T4TOTAL, FREET4, T3FREE, THYROIDAB in the last 72 hours. Anemia Panel: No results for input(s): VITAMINB12, FOLATE, FERRITIN, TIBC, IRON, RETICCTPCT in the last 72 hours. Urine analysis:    Component Value Date/Time   COLORURINE YELLOW (A) 05/03/2024 2257   APPEARANCEUR HAZY (A) 05/03/2024 2257   LABSPEC 1.016 05/03/2024 2257   PHURINE 5.0 05/03/2024 2257   GLUCOSEU >=500 (A) 05/03/2024 2257   HGBUR NEGATIVE 05/03/2024 2257   BILIRUBINUR NEGATIVE 05/03/2024 2257   KETONESUR NEGATIVE 05/03/2024 2257   PROTEINUR 100 (A) 05/03/2024 2257   NITRITE NEGATIVE 05/03/2024 2257   LEUKOCYTESUR NEGATIVE 05/03/2024 2257   Sepsis Labs: @LABRCNTIP (procalcitonin:4,lacticidven:4)  ) Recent Results (from the past 240 hours)  Resp panel by RT-PCR (RSV, Flu A&B, Covid) Anterior Nasal Swab     Status: None   Collection Time: 05/03/24 10:57 PM   Specimen: Anterior Nasal Swab  Result Value Ref Range Status   SARS Coronavirus 2 by RT PCR NEGATIVE NEGATIVE Final    Comment: (NOTE) SARS-CoV-2 target nucleic acids are NOT DETECTED.  The SARS-CoV-2 RNA is generally detectable in upper respiratory specimens during the acute phase of infection. The  lowest concentration of SARS-CoV-2 viral copies this assay can detect is 138 copies/mL. A negative result does not preclude SARS-Cov-2 infection and should not be used as the sole basis for treatment or other patient management decisions. A negative result may occur with  improper specimen collection/handling, submission of specimen other than nasopharyngeal swab, presence of viral mutation(s) within the areas targeted by this assay, and inadequate number of viral copies(<138 copies/mL). A negative result must be combined with clinical observations, patient history, and epidemiological information. The expected result is Negative.  Fact Sheet for Patients:  bloggercourse.com  Fact Sheet for Healthcare Providers:  seriousbroker.it  This test is no t yet approved or cleared by the [place]  FDA and  has been authorized for detection and/or diagnosis of SARS-CoV-2 by FDA under an Emergency Use Authorization (EUA). This EUA will remain  in effect (meaning this test can be used) for the duration of the COVID-19 declaration under Section 564(b)(1) of the Act, 21 U.S.C.section 360bbb-3(b)(1), unless the authorization is terminated  or revoked sooner.       Influenza A by PCR NEGATIVE NEGATIVE Final   Influenza B by PCR NEGATIVE NEGATIVE Final    Comment: (NOTE) The Xpert Xpress SARS-CoV-2/FLU/RSV plus assay is intended as an aid in the diagnosis of influenza from Nasopharyngeal swab specimens and should not be used as a sole basis for treatment. Nasal washings and aspirates are unacceptable for Xpert Xpress SARS-CoV-2/FLU/RSV testing.  Fact Sheet for Patients: bloggercourse.com  Fact Sheet for Healthcare Providers: seriousbroker.it  This test is not yet approved or cleared by the [place]  FDA and has been authorized for detection and/or diagnosis of SARS-CoV-2 by FDA under  an Emergency Use Authorization (EUA). This EUA will remain in effect (meaning this test can be used) for the duration of the COVID-19 declaration under Section 564(b)(1) of the Act, 21 U.S.C. section 360bbb-3(b)(1), unless the authorization is terminated or revoked.     Resp Syncytial Virus by PCR NEGATIVE NEGATIVE Final    Comment: (NOTE) Fact Sheet for Patients: bloggercourse.com  Fact Sheet for Healthcare Providers: seriousbroker.it  This test is not yet approved or cleared by the [place]  FDA and has been authorized for detection and/or diagnosis of SARS-CoV-2 by FDA under an Emergency Use Authorization (EUA). This EUA will remain in effect (meaning this test can be used)  for the duration of the COVID-19 declaration under Section 564(b)(1) of the Act, 21 U.S.C. section 360bbb-3(b)(1), unless the authorization is terminated or revoked.  Performed at [facility], [address] [place], [place] [address]   Blood culture (routine x 2)     Status: None (Preliminary result)   Collection Time: 05/03/24 10:58 PM   Specimen: BLOOD  Result Value Ref Range Status   Specimen Description BLOOD RIGHT ANTECUBITAL  Final   Special Requests   Final    BOTTLES DRAWN AEROBIC AND ANAEROBIC Blood Culture results may not be optimal due to an inadequate volume of blood received in culture bottles   Culture  Setup Time   Final    Organism ID to follow GRAM POSITIVE COCCI IN BOTH AEROBIC AND ANAEROBIC BOTTLES CRITICAL RESULT CALLED TO, READ BACK BY AND VERIFIED WITH: LUM HILARIO GOWER D AT 1546 05/04/24 RAM Performed at [facility], [address] [place], [place] [address]    Culture GRAM POSITIVE COCCI  Final   Report Status PENDING  Incomplete  Blood Culture ID Panel (Reflexed)     Status: Abnormal   Collection Time: 05/03/24 10:58 PM  Result Value Ref Range Status   Enterococcus faecalis NOT DETECTED NOT  DETECTED Final   Enterococcus Faecium NOT DETECTED NOT DETECTED Final   Listeria monocytogenes NOT DETECTED NOT DETECTED Final   Staphylococcus species DETECTED (A) NOT DETECTED Final    Comment: CRITICAL RESULT CALLED TO, READ BACK BY AND VERIFIED WITH: MADISON H., PHARM D AT 1546 05/04/24 RAM    Staphylococcus aureus (BCID) NOT DETECTED NOT DETECTED Final   Staphylococcus epidermidis NOT DETECTED NOT DETECTED Final   Staphylococcus lugdunensis NOT DETECTED NOT DETECTED Final   Streptococcus species NOT DETECTED NOT DETECTED Final   Streptococcus agalactiae NOT DETECTED NOT DETECTED Final   Streptococcus pneumoniae NOT DETECTED NOT DETECTED Final   Streptococcus pyogenes NOT DETECTED NOT DETECTED Final   A.calcoaceticus-baumannii NOT DETECTED NOT DETECTED Final   Bacteroides fragilis NOT DETECTED NOT DETECTED Final   Enterobacterales NOT DETECTED NOT DETECTED Final   Enterobacter cloacae complex NOT DETECTED NOT DETECTED Final   Escherichia coli NOT DETECTED NOT DETECTED Final   Klebsiella aerogenes NOT DETECTED NOT DETECTED Final   Klebsiella oxytoca NOT DETECTED NOT DETECTED Final   Klebsiella pneumoniae NOT DETECTED NOT DETECTED Final   Proteus species NOT DETECTED NOT DETECTED Final   Salmonella species NOT DETECTED NOT DETECTED Final   Serratia marcescens NOT DETECTED NOT DETECTED Final   Haemophilus influenzae NOT DETECTED NOT DETECTED Final   Neisseria meningitidis NOT DETECTED NOT DETECTED Final   Pseudomonas aeruginosa NOT DETECTED NOT DETECTED Final   Stenotrophomonas maltophilia NOT DETECTED NOT DETECTED Final   Candida albicans NOT DETECTED NOT DETECTED Final   Candida auris NOT DETECTED NOT DETECTED Final   Candida glabrata NOT DETECTED NOT DETECTED Final   Candida krusei NOT DETECTED NOT DETECTED Final   Candida parapsilosis NOT DETECTED NOT DETECTED Final   Candida tropicalis NOT DETECTED NOT DETECTED Final   Cryptococcus neoformans/gattii NOT DETECTED NOT DETECTED  Final    Comment: Performed at [facility], [address] [place], [place] [address]  Blood culture (routine x 2)     Status: None (Preliminary result)   Collection Time: 05/03/24 11:03 PM   Specimen: BLOOD  Result Value Ref Range Status   Specimen Description BLOOD BLOOD RIGHT FOREARM  Final   Special Requests   Final    BOTTLES DRAWN AEROBIC AND  ANAEROBIC Blood Culture results may not be optimal due to an inadequate volume of blood received in culture bottles   Culture   Final    NO GROWTH 2 DAYS Performed at [facility], [address] [place], [place] [address]    Report Status PENDING  Incomplete  Respiratory (~20 pathogens) panel by PCR     Status: None   Collection Time: 05/04/24  9:57 AM   Specimen: Nasopharyngeal Swab; Respiratory  Result Value Ref Range Status   Adenovirus NOT DETECTED NOT DETECTED Final   Coronavirus 229E NOT DETECTED NOT DETECTED Final    Comment: (NOTE) The Coronavirus on the Respiratory Panel, DOES NOT test for the novel  Coronavirus (2019 nCoV)    Coronavirus HKU1 NOT DETECTED NOT DETECTED Final   Coronavirus NL63 NOT DETECTED NOT DETECTED Final   Coronavirus OC43 NOT DETECTED NOT DETECTED Final   Metapneumovirus NOT DETECTED NOT DETECTED Final   Rhinovirus / Enterovirus NOT DETECTED NOT DETECTED Final   Influenza A NOT DETECTED NOT DETECTED Final   Influenza B NOT DETECTED NOT DETECTED Final   Parainfluenza Virus 1 NOT DETECTED NOT DETECTED Final   Parainfluenza Virus 2 NOT DETECTED NOT DETECTED Final   Parainfluenza Virus 3 NOT DETECTED NOT DETECTED Final   Parainfluenza Virus 4 NOT DETECTED NOT DETECTED Final   Respiratory Syncytial Virus NOT DETECTED NOT DETECTED Final   Bordetella pertussis NOT DETECTED NOT DETECTED Final   Bordetella Parapertussis NOT DETECTED NOT DETECTED Final   Chlamydophila pneumoniae NOT DETECTED NOT DETECTED Final   Mycoplasma pneumoniae NOT DETECTED NOT DETECTED Final    Comment: Performed  at [facility], [address] [place], [place] [address]         Radiology Studies: ECHOCARDIOGRAM COMPLETE Result Date: 05/04/2024    ECHOCARDIOGRAM REPORT   Patient Name:   Cordon Stohr Date of Exam: 05/04/2024 Medical Rec #:  [id number]    Height:       74.0 in Accession #:    [id number]   Weight:       352.0 lb Date of Birth:  [birth year]    BSA:          2.764 m Patient Age:    58 years     BP:           148/85 mmHg Patient Gender: M            HR:           109 bpm. Exam Location:  [facility] Procedure: 2D Echo, Cardiac Doppler, Color Doppler and Intracardiac            Opacification Agent (Both Spectral and Color Flow Doppler were            utilized during procedure). STAT ECHO Indications:     CHF I50.31  History:         Patient has no prior history of Echocardiogram examinations.  Sonographer:     Thedora Louder RDCS, FASE Referring Phys:  [id number] LORANE POLAND Diagnosing Phys: Denyse Bathe  Sonographer Comments: Technically challenging study due to limited acoustic windows, suboptimal apical window, no subcostal window and patient is obese. Image acquisition challenging due to patient body habitus and Image acquisition challenging due to respiratory motion. IMPRESSIONS  1. Left ventricular ejection fraction, by estimation, is 40 to 45%. The left ventricle has mildly decreased function. The left ventricle demonstrates global hypokinesis. The left ventricular internal cavity size was mildly to moderately dilated. There is moderate concentric  left ventricular hypertrophy. Left ventricular diastolic parameters were normal.  2. Right ventricular systolic function is low normal. The right ventricular size is mildly enlarged. Mildly increased right ventricular wall thickness.  3. Left atrial size was mild to moderately dilated.  4. Right atrial size was mild to moderately dilated.  5. The mitral valve is grossly normal. Trivial mitral valve regurgitation.  6. The aortic valve is grossly normal.  Aortic valve regurgitation is mild. FINDINGS  Left Ventricle: Left ventricular ejection fraction, by estimation, is 40 to 45%. The left ventricle has mildly decreased function. The left ventricle demonstrates global hypokinesis. Definity  contrast agent was given IV to delineate the left ventricular  endocardial borders. Strain was performed and the global longitudinal strain is indeterminate. The left ventricular internal cavity size was mildly to moderately dilated. There is moderate concentric left ventricular hypertrophy. Left ventricular diastolic parameters were normal. Right Ventricle: The right ventricular size is mildly enlarged. Mildly increased right ventricular wall thickness. Right ventricular systolic function is low normal. Left Atrium: Left atrial size was mild to moderately dilated. Right Atrium: Right atrial size was mild to moderately dilated. Pericardium: Trivial pericardial effusion is present. The pericardial effusion is circumferential. Mitral Valve: The mitral valve is grossly normal. Trivial mitral valve regurgitation. Tricuspid Valve: The tricuspid valve is grossly normal. Tricuspid valve regurgitation is trivial. Aortic Valve: The aortic valve is grossly normal. Aortic valve regurgitation is mild. Pulmonic Valve: The pulmonic valve was grossly normal. Pulmonic valve regurgitation is trivial. Aorta: The aortic root, ascending aorta and aortic arch are all structurally normal, with no evidence of dilitation or obstruction. IAS/Shunts: No atrial level shunt detected by color flow Doppler. Additional Comments: 3D was performed not requiring image post processing on an independent workstation and was indeterminate.  LEFT VENTRICLE PLAX 2D LVIDd:         6.90 cm      Diastology LVIDs:         5.50 cm      LV e' lateral:   10.90 cm/s LV PW:         1.00 cm      LV E/e' lateral: 8.3 LV IVS:        1.10 cm LVOT diam:     2.20 cm LVOT Area:     3.80 cm  LV Volumes (MOD) LV vol d, MOD A2C: 200.0 ml  LV vol d, MOD A4C: 175.0 ml LV vol s, MOD A2C: 114.0 ml LV vol s, MOD A4C: 105.0 ml LV SV MOD A2C:     86.0 ml LV SV MOD A4C:     175.0 ml LV SV MOD BP:      74.5 ml LEFT ATRIUM           Index LA Vol (A4C): 59.0 ml 21.35 ml/m                        PULMONIC VALVE AORTA                 PV Vmax:        1.04 m/s Ao Root diam: 3.20 cm PV Peak grad:   4.3 mmHg Ao Asc diam:  2.90 cm RVOT Peak grad: 3 mmHg  MITRAL VALVE MV Area (PHT): 4.19 cm    SHUNTS MV Decel Time: 181 msec    Systemic Diam: 2.20 cm MV E velocity: 90.70 cm/s MV A velocity: 78.60 cm/s MV E/A ratio:  1.15 Shaukat Bluelinx signed by  Shaukat Fernand Signature Date/Time: 05/04/2024/1:21:33 PM    Final    DG Chest Portable 1 View Result Date: 05/03/2024 EXAM: 1 VIEW(S) XRAY OF THE CHEST 05/03/2024 11:19:00 PM COMPARISON: None available. CLINICAL HISTORY: shortness of breath FINDINGS: LUNGS AND PLEURA: Elevated right hemidiaphragm and low lung volumes. Mild pulmonary edema. No pleural effusion. No pneumothorax. HEART AND MEDIASTINUM: Cardiomegaly. BONES AND SOFT TISSUES: No acute osseous abnormality. IMPRESSION: 1. Mild pulmonary edema. 2. Cardiomegaly. Electronically signed by: Oneil Devonshire MD 05/03/2024 11:26 PM EST RP Workstation: [id number]        Scheduled Meds:  amLODipine  10 mg Oral Daily   apixaban  5 mg Oral BID   atorvastatin  80 mg Oral Daily   cyclobenzaprine  10 mg Oral BID   doxycycline  100 mg Oral Q12H   famotidine  20 mg Oral BID   fenofibrate  160 mg Oral q morning   FLUoxetine  40 mg Oral q AM   furosemide  40 mg Intravenous BID   guaiFENesin  600 mg Oral BID   insulin aspart  0-20 Units Subcutaneous TID WC   insulin aspart  0-5 Units Subcutaneous QHS   insulin aspart  15 Units Subcutaneous TID WC   insulin glargine-yfgn  50 Units Subcutaneous Q24H   ipratropium-albuterol  3 mL Nebulization TID   methylPREDNISolone (SOLU-MEDROL) injection  40 mg Intravenous Q12H   pantoprazole  40 mg Oral q AM    umeclidinium bromide  1 puff Inhalation Daily   Continuous Infusions:   LOS: 1 day     Devaughn KATHEE Ban, MD [facility]   If 7PM-7AM, please contact night-coverage [email or web address] Password [email or web address] 05/05/2024, 9:35 AM

## 2024-05-05 NOTE — Progress Notes (Signed)
 Attempted to place patient on Bipap for the night, however patient declined. He stated that he did not like it and that he could not get any sleep with it on. I educated and stressed the importance of wearing it to treat his CHF/Pulmonary Edema, as well as OSA. Patient expressed understanding, but still declined. He stated that he may try it tomorrow night. Patient remains on 4L nasal cannula with SpO2 96%. Moderate dyspnea on exertion. Will continue to monitor.

## 2024-05-05 NOTE — Plan of Care (Signed)

## 2024-05-05 NOTE — Telephone Encounter (Signed)
 Patient Product/process development scientist completed.    The patient is insured through [employer]. Patient has Medicare and is not eligible for a copay card, but may be able to apply for patient assistance or Medicare RX Payment Plan (Patient Must reach out to their plan, if eligible for payment plan), if available.    Ran test claim for sacubitril-valsartan (Entresto) 24-26 mg and the current 30 day co-pay is $0.00.  Ran test claim for Farxiga 10 mg and the current 30 day co-pay is $0.00  Ran test claim for Jardiance 10 mg and the current 30 day co-pay is $0.00  This test claim was processed through [employer]- copay amounts may vary at other pharmacies due to [employer], or as the patient moves through the different stages of their insurance plan.     Reyes Sharps, CPHT Pharmacy Technician Patient Advocate Specialist Lead [facility] Patient Advocate Team Direct Number: [phone]  Fax: [phone]

## 2024-05-05 NOTE — Evaluation (Signed)
 Physical Therapy Evaluation Patient Details Name: Zachary Deleon MRN: [id number] DOB: 1965-09-27 Today's Date: 05/05/2024  History of Present Illness  Zachary Deleon is a 58 y.o. male with COPD on 2 L oxygen at baseline with CHF, extensive tobacco abuse history, CAD, diabetes, carotid stenosis, hypertension, morbid obesity with a BMI of 45, who presents via EMS on 11/22 for respiratory distress.  Clinical Impression  Pt is a very pleasant 58 y.o. male admitted d/t a CHF exacerbation. Pt reports previously increasing to 4L O2 at baseline, up from 2L. Upon PT arrival, pt was in bathroom after toileting and was seated in a visitor chair with wife present and on 6L with heavy SOB an O2 at 96%. Pt reported that nursing had walked him to the bathroom with his [facility] and he was able to carryout toileting but when it came to wiping he almost sustained a fall d/t DOE and had to take a seat in the visitors chair. SPT wheeled recliner into bathroom for chair follow as pt ambulated with [facility] for 25ft back to bedside with supervision and min A for O2 tank management. Pt required seated rest in visitors chair as room was set up for recliner placement. Pt then performed another STS with supervision and ambulated another 5 ft to sit in recliner. Pt was left in the recliner with nursing about to assess vitals. Pt weaned down to 4L with O2 in low 90%'s at end of session. Pt will continue to benefit from skilled PT services to address his decline in activity tolerance and overall balance d/t LLE deficits from hx of CVA.      If plan is discharge home, recommend the following: A little help with walking and/or transfers   Can travel by private vehicle        Equipment Recommendations BSC/3in1  Recommendations for Other Services       Functional Status Assessment Patient has had a recent decline in their functional status and demonstrates the ability to make significant improvements in function in a reasonable and  predictable amount of time.     Precautions / Restrictions Precautions Precautions: Fall Recall of Precautions/Restrictions: Intact Restrictions Weight Bearing Restrictions Per Provider Order: No      Mobility  Bed Mobility               General bed mobility comments: not tested    Transfers Overall transfer level: Needs assistance Equipment used: 1 person hand held assist, Straight cane Transfers: Sit to/from Stand Sit to Stand: Supervision           General transfer comment: supervision to perform STS and amb with [facility]    Ambulation/Gait Ambulation/Gait assistance: Supervision Gait Distance (Feet): 20 Feet Assistive device: Straight cane Gait Pattern/deviations: Step-through pattern, Decreased stride length, Trunk flexed       General Gait Details: ambulated from bathroom to chair at bedside with [facility] and supervision with min A for O2 tank management  Stairs            Wheelchair Mobility     Tilt Bed    Modified Rankin (Stroke Patients Only)       Balance Overall balance assessment: Needs assistance Sitting-balance support: No upper extremity supported, Feet supported Sitting balance-Leahy Scale: Good Sitting balance - Comments: no physical assist or vc needed   Standing balance support: Single extremity supported, During functional activity Standing balance-Leahy Scale: Fair Standing balance comment: supervision to balance in standing  Pertinent Vitals/Pain Pain Assessment Pain Assessment: No/denies pain    Home Living Family/patient expects to be discharged to:: Private residence Living Arrangements: Spouse/significant other;Children Available Help at Discharge: Family;Available 24 hours/day Type of Home: Mobile home Home Access: Stairs to enter       Home Layout: One level Home Equipment: Cane - single point;Wheelchair - manual;Wheelchair - power;Lift chair;Hospital bed      Prior  Function Prior Level of Function : Needs assist             Mobility Comments: Hx of stroke, uses SPC ADLs Comments: assist for dressign and IADLs     Extremity/Trunk Assessment   Upper Extremity Assessment Upper Extremity Assessment: Overall WFL for tasks assessed LUE Deficits / Details: hx cva; LLE noted to be unreliable for strength and balance    Lower Extremity Assessment Lower Extremity Assessment: LLE deficits/detail LLE Deficits / Details: decreased strength s/p CVA    Cervical / Trunk Assessment Cervical / Trunk Assessment: Kyphotic  Communication   Communication Communication: No apparent difficulties Factors Affecting Communication: Reduced clarity of speech    Cognition Arousal: Alert Behavior During Therapy: WFL for tasks assessed/performed   PT - Cognitive impairments: No apparent impairments                         Following commands: Intact       Cueing Cueing Techniques: Verbal cues     General Comments General comments (skin integrity, edema, etc.): greeted at 6L, high 90%'s - weaned down to 4L with O2 remaining in low 90%'s end of session    Exercises     Assessment/Plan    PT Assessment Patient needs continued PT services  PT Problem List Decreased activity tolerance;Decreased balance;Decreased mobility       PT Treatment Interventions Gait training;Functional mobility training;Therapeutic exercise;Therapeutic activities;Balance training    PT Goals (Current goals can be found in the Care Plan section)  Acute Rehab PT Goals Patient Stated Goal: to go home PT Goal Formulation: With patient Time For Goal Achievement: 05/19/24 Potential to Achieve Goals: Good    Frequency Min 1X/week     Co-evaluation               AM-PAC PT 6 Clicks Mobility  Outcome Measure Help needed turning from your back to your side while in a flat bed without using bedrails?: None Help needed moving from lying on your back to sitting  on the side of a flat bed without using bedrails?: None Help needed moving to and from a bed to a chair (including a wheelchair)?: A Little Help needed standing up from a chair using your arms (e.g., wheelchair or bedside chair)?: None Help needed to walk in hospital room?: A Little Help needed climbing 3-5 steps with a railing? : A Lot 6 Click Score: 20    End of Session Equipment Utilized During Treatment: Oxygen Activity Tolerance: Patient tolerated treatment well Patient left: in chair;with call bell/phone within reach;with nursing/sitter in room Nurse Communication: Mobility status PT Visit Diagnosis: Unsteadiness on feet (R26.81);Other abnormalities of gait and mobility (R26.89);Difficulty in walking, not elsewhere classified (R26.2)    Time: 8882-8865 PT Time Calculation (min) (ACUTE ONLY): 17 min   Charges:                 Allena Bulls, SPT   Allena Bulls 05/05/2024, 12:47 PM

## 2024-05-05 NOTE — Progress Notes (Signed)
   05/05/24 1415  Spiritual Encounters  Type of Visit Initial  Care provided to: Pt and family  Referral source Nurse (RN/NT/LPN)  Reason for visit Routine spiritual support  OnCall Visit No  Spiritual Framework  Presenting Themes Goals in life/care  Interventions  Spiritual Care Interventions Made Established relationship of care and support;Compassionate presence;Reflective listening;Normalization of emotions  Intervention Outcomes  Outcomes Connection to spiritual care;Awareness of support

## 2024-05-05 NOTE — Inpatient Diabetes Management (Addendum)
 Inpatient Diabetes Program Recommendations  AACE/ADA: New Consensus Statement on Inpatient Glycemic Control  Target Ranges:  Prepandial:   less than 140 mg/dL      Peak postprandial:   less than 180 mg/dL (1-2 hours)      Critically ill patients:  140 - 180 mg/dL    Latest Reference Range & Units 05/04/24 04:11 05/04/24 05:41 05/04/24 08:40 05/04/24 12:35 05/04/24 16:41 05/04/24 21:29 05/05/24 07:53  Glucose-Capillary 70 - 99 mg/dL 557 (H) 548 (H) 602 (H) 434 (H) 360 (H) 380 (H) 391 (H)    Latest Reference Range & Units 05/03/24 22:57  Hemoglobin A1C 4.8 - 5.6 % 10.6 (H)   Review of Glycemic Control  Diabetes history: DM2 Outpatient Diabetes medications: Toujeo 65 units daily, Novolog 20 units TID (plus up to 40 more units for correction based on scale), Tradjenta 5 mg QAM, Metformin 1000 mg BID Current orders for Inpatient glycemic control: Semglee 50 units Q24H, Novolog 15 units TID, Novolog 0-20 units TID with meals, Novolog 0-5 units at bedtime; Solumedrol 40 mg Q12H  Inpatient Diabetes Program Recommendations:    Insulin: If steroids are continued as ordered, please consider further increasing meal coverage to Novolog 20 units TID with meals.  HbgA1C:  A1C 10.6% on 05/03/24 indicating an average glucose of 258 mg/dl over the past 2-3 months.  Addendum 05/05/24@10 :10-Spoke with patient and his wife about diabetes and home regimen for diabetes control. Patient reports being followed by PCP [facility]) for diabetes management and currently taking Toujeo 65 units daily, Novolog 20 units TID with meals plus correction scale (up to 40 more units if needed), Tradjenta 5 mg QAM, Metformin 1000 mg BID as an outpatient for diabetes control. Patient reports taking DM medications as prescribed and was recently started on Tradjenta 5 mg daily about 2-3 weeks ago. Patient reports checking glucose 3 times per day and that it is usually in the 150-200's mg/dl (had been in the 799-699'd mg/dl prior  to starting Tradjenta). Patient has used CGM sensors in past but they fall off quickly due to patient sweating. Inquired about prior A1C and patient reports not being able to recall last A1C value. Discussed A1C results (10.6% on 05/03/24) and explained that current A1C indicates an average glucose of 258 mg/dl over the past 2-3 months. Discussed glucose and A1C goals. Discussed importance of checking CBGs and maintaining good CBG control to prevent long-term and short-term complications. Explained how hyperglycemia leads to damage within blood vessels which lead to the common complications seen with uncontrolled diabetes. Stressed to the patient the importance of improving glycemic control to prevent further complications from uncontrolled diabetes. Discussed impact of nutrition, exercise, stress, sickness, and medications on diabetes control.  Discussed how steroids impact glucose and explained that if he is discharged on oral steroids, CBGs will be higher and his insulin needs may change. Encouraged patient to reach out to his PCP if he continues to have hyperglycemia or if he has any issues with hypoglycemia. Encouraged patient to talk to PCP about being referred to Endocrinologist to help manage DM. Patient reports he has all DM meds and supplies at home and has no needs at discharge.  Patient verbalized understanding of information discussed and reports no further questions at this time related to diabetes.  Thanks, Earnie Gainer, RN, MSN, CDCES Diabetes Coordinator Inpatient Diabetes Program [phone] (Team Pager from 8am to 5pm)

## 2024-05-05 NOTE — Progress Notes (Signed)
 Occupational Therapy Treatment Patient Details Name: Zachary Deleon MRN: [id number] DOB: 22-Mar-1966 Today's Date: 05/05/2024   History of present illness Zachary Deleon is a 58 y.o. male with COPD on 2 L oxygen at baseline with CHF, extensive tobacco abuse history, CAD, diabetes, carotid stenosis, hypertension, morbid obesity with a BMI of 45, who presents via EMS on 11/22 for respiratory distress.   OT comments  Pt seen for OT treatment this date. Pt on 3.5L beginning of session SpO2 96%, SpO2 dropping to 88% with activity, left on 4.5L to recover with SpO2 93%. HR increases to 121 bpm with minimal exertion, pt dyspneic while talking. Cues for PLB techniques. Pt requires CGA-supervision with +1 HHA & SPC, pt with poor standing tolerance and noted LUE/LE instability / decreased functional use with hx of prior CVA. Pt spontaneously sitting after standing attempt, CGA for safety and unclear if true LOB vs decreased cardiopulmonary status. OT will follow acutely, discharge recommendation remains appropriate.       If plan is discharge home, recommend the following:  Help with stairs or ramp for entrance;A little help with walking and/or transfers;A little help with bathing/dressing/bathroom   Equipment Recommendations  BSC/3in1       Precautions / Restrictions Precautions Precautions: Fall Recall of Precautions/Restrictions: Intact Restrictions Weight Bearing Restrictions Per Provider Order: No       Mobility Bed Mobility               General bed mobility comments: NT, pt recieved and left sitting at EOB with RN present    Transfers Overall transfer level: Needs assistance Equipment used: 1 person hand held assist, Straight cane Transfers: Sit to/from Stand Sit to Stand: Supervision           General transfer comment: decreased LUE / LE strength and functional use during transfers     Balance Overall balance assessment: Needs assistance Sitting-balance support: No upper  extremity supported, Feet supported Sitting balance-Leahy Scale: Good Sitting balance - Comments: no physical assist or vc needed   Standing balance support: Single extremity supported, During functional activity Standing balance-Leahy Scale: Fair Standing balance comment: minimal standing tolerance, pt with LLE instability (unclear if L inattention 2/2 hx of CVA, decreased cardiopulm status vs actual LOB) and sitting spontaneously after ~30 sec standing                           ADL either performed or assessed with clinical judgement   ADL Overall ADL's : Needs assistance/impaired     Grooming: Applying deodorant;Sitting Grooming Details (indicate cue type and reason): sitting EOB                             Functional mobility during ADLs: Supervision/safety;Cane General ADL Comments: Pt with poor activity tolerance, dyspneic with just talking, on 3.5L start of session, placedon 4.5L end of session to recover    Extremity/Trunk Assessment Upper Extremity Assessment Upper Extremity Assessment: Overall WFL for tasks assessed LUE Deficits / Details: hx cva; LLE noted to be unreliable for strength and balance   Lower Extremity Assessment Lower Extremity Assessment: LLE deficits/detail LLE Deficits / Details: decreased strength s/p CVA   Cervical / Trunk Assessment Cervical / Trunk Assessment: Kyphotic             Communication Communication Communication: No apparent difficulties Factors Affecting Communication: Reduced clarity of speech   Cognition Arousal: Alert Behavior During  Therapy: WFL for tasks assessed/performed Cognition: No apparent impairments                               Following commands: Intact        Cueing   Cueing Techniques: Verbal cues  Exercises Exercises: Other exercises, General Lower Extremity Other Exercises Other Exercises: x3 STS, cues for hand placement. pt with DOE       General Comments Pt on  3.5L beginning of session, SpO2 dropping to 88% with activity, left on 4.5L to recover. HR increases to 121 bpm with minimal exertion.    Pertinent Vitals/ Pain       Pain Assessment Pain Assessment: No/denies pain  Home Living Family/patient expects to be discharged to:: Private residence Living Arrangements: Spouse/significant other;Children Available Help at Discharge: Family;Available 24 hours/day Type of Home: Mobile home Home Access: Stairs to enter     Home Layout: One level         Bathroom Toilet: Standard Bathroom Accessibility: Yes   Home Equipment: Cane - single point;Wheelchair - manual;Wheelchair - power;Lift chair;Hospital bed              Frequency  Min 2X/week        Progress Toward Goals  OT Goals(current goals can now be found in the care plan section)  Progress towards OT goals: Progressing toward goals  Acute Rehab OT Goals OT Goal Formulation: With patient/family Time For Goal Achievement: 05/18/24 Potential to Achieve Goals: Good ADL Goals Pt Will Perform Grooming: with modified independence;standing Pt Will Perform Lower Body Dressing: with min assist;with caregiver independent in assisting;sit to/from stand Pt Will Transfer to Toilet: with modified independence;ambulating;regular height toilet  Plan         AM-PAC OT 6 Clicks Daily Activity     Outcome Measure   Help from another person eating meals?: None Help from another person taking care of personal grooming?: A Little Help from another person toileting, which includes using toliet, bedpan, or urinal?: A Little Help from another person bathing (including washing, rinsing, drying)?: A Lot Help from another person to put on and taking off regular upper body clothing?: A Little Help from another person to put on and taking off regular lower body clothing?: A Lot 6 Click Score: 17    End of Session Equipment Utilized During Treatment: Gait belt;Oxygen (SPC)  OT Visit  Diagnosis: Other abnormalities of gait and mobility (R26.89);Muscle weakness (generalized) (M62.81)   Activity Tolerance Patient limited by fatigue (limited by decreased cardiopulm status)   Patient Left in bed;with call bell/phone within reach;with family/visitor present;with nursing/sitter in room   Nurse Communication Mobility status        Time: 9063-8997 OT Time Calculation (min): 26 min  Charges: OT General Charges $OT Visit: 1 Visit OT Treatments $Self Care/Home Management : 8-22 mins $Therapeutic Activity: 8-22 mins  Frederika Hukill L. Aditya Nastasi, OTR/L  05/05/24, 1:25 PM

## 2024-05-06 ENCOUNTER — Inpatient Hospital Stay

## 2024-05-06 DIAGNOSIS — I5023 Acute on chronic systolic (congestive) heart failure: Secondary | ICD-10-CM | POA: Diagnosis not present

## 2024-05-06 LAB — BASIC METABOLIC PANEL WITH GFR
Anion gap: 14 (ref 5–15)
BUN: 36 mg/dL — ABNORMAL HIGH (ref 6–20)
CO2: 28 mmol/L (ref 22–32)
Calcium: 8.7 mg/dL — ABNORMAL LOW (ref 8.9–10.3)
Chloride: 98 mmol/L (ref 98–111)
Creatinine, Ser: 1.49 mg/dL — ABNORMAL HIGH (ref 0.61–1.24)
GFR, Estimated: 54 mL/min — ABNORMAL LOW (ref >60)
Glucose, Bld: 191 mg/dL — ABNORMAL HIGH (ref 70–99)
Potassium: 4.4 mmol/L (ref 3.5–5.1)
Sodium: 139 mmol/L (ref 135–145)

## 2024-05-06 LAB — BLOOD GAS, VENOUS
Acid-Base Excess: 5.5 mmol/L — ABNORMAL HIGH (ref 0.0–2.0)
Acid-Base Excess: 8.5 mmol/L — ABNORMAL HIGH (ref 0.0–2.0)
Bicarbonate: 31.6 mmol/L — ABNORMAL HIGH (ref 20.0–28.0)
Bicarbonate: 32.8 mmol/L — ABNORMAL HIGH (ref 20.0–28.0)
O2 Saturation: 67 %
O2 Saturation: 65.8 %
Patient temperature: 37
Patient temperature: 37
pCO2, Ven: 51 mmHg (ref 44–60)
pCO2, Ven: 43 mmHg — ABNORMAL LOW (ref 44–60)
pH, Ven: 7.4 (ref 7.25–7.43)
pH, Ven: 7.49 — ABNORMAL HIGH (ref 7.25–7.43)
pO2, Ven: 42 mmHg (ref 32–45)
pO2, Ven: 40 mmHg (ref 32–45)

## 2024-05-06 LAB — GLUCOSE, CAPILLARY
Glucose-Capillary: 213 mg/dL — ABNORMAL HIGH (ref 70–99)
Glucose-Capillary: 259 mg/dL — ABNORMAL HIGH (ref 70–99)
Glucose-Capillary: 267 mg/dL — ABNORMAL HIGH (ref 70–99)
Glucose-Capillary: 285 mg/dL — ABNORMAL HIGH (ref 70–99)
Glucose-Capillary: 303 mg/dL — ABNORMAL HIGH (ref 70–99)

## 2024-05-06 LAB — TROPONIN T, HIGH SENSITIVITY
Troponin T High Sensitivity: 234 ng/L (ref 0–19)
Troponin T High Sensitivity: 221 ng/L (ref 0–19)

## 2024-05-06 LAB — D-DIMER, QUANTITATIVE: D-Dimer, Quant: 2.58 ug{FEU}/mL — ABNORMAL HIGH (ref 0.00–0.50)

## 2024-05-06 LAB — MAGNESIUM: Magnesium: 2.1 mg/dL (ref 1.7–2.4)

## 2024-05-06 MED ORDER — FUROSEMIDE 10 MG/ML IJ SOLN
80.0000 mg | INTRAMUSCULAR | Status: AC
  Administered 2024-05-06: 80 mg via INTRAVENOUS
  Filled 2024-05-06: qty 8

## 2024-05-06 MED ORDER — FUROSEMIDE 10 MG/ML IJ SOLN: 80.0000 mg | INTRAMUSCULAR | Status: DC

## 2024-05-06 MED ORDER — AMIODARONE LOAD VIA INFUSION
150.0000 mg | Freq: Once | INTRAVENOUS | Status: AC
  Administered 2024-05-06: 150 mg via INTRAVENOUS
  Filled 2024-05-06: qty 83.34

## 2024-05-06 MED ORDER — INSULIN ASPART 100 UNIT/ML IJ SOLN
0.0000 [IU] | INTRAMUSCULAR | Status: DC
  Administered 2024-05-06: 7 [IU] via SUBCUTANEOUS
  Administered 2024-05-06: 11 [IU] via SUBCUTANEOUS
  Administered 2024-05-07 (×2): 7 [IU] via SUBCUTANEOUS
  Administered 2024-05-07 (×3): 11 [IU] via SUBCUTANEOUS
  Filled 2024-05-06: qty 11
  Filled 2024-05-06: qty 7
  Filled 2024-05-06: qty 11
  Filled 2024-05-06: qty 7
  Filled 2024-05-06 (×2): qty 11
  Filled 2024-05-06: qty 7

## 2024-05-06 MED ORDER — AMIODARONE HCL IN DEXTROSE 360-4.14 MG/200ML-% IV SOLN
60.0000 mg/h | INTRAVENOUS | Status: AC
  Administered 2024-05-06 (×2): 60 mg/h via INTRAVENOUS
  Filled 2024-05-06 (×2): qty 200

## 2024-05-06 MED ORDER — METHYLPREDNISOLONE SODIUM SUCC 40 MG IJ SOLR
40.0000 mg | Freq: Once | INTRAMUSCULAR | Status: AC
  Administered 2024-05-06: 40 mg via INTRAVENOUS
  Filled 2024-05-06: qty 1

## 2024-05-06 MED ORDER — FUROSEMIDE 10 MG/ML IJ SOLN: 60.0000 mg | Freq: Two times a day (BID) | INTRAMUSCULAR | Status: DC

## 2024-05-06 MED ADMIN — Amiodarone HCl in Dextrose 4.14% IV Soln 360 MG/200ML: 30 mg/h | INTRAVENOUS | NDC 43066036020

## 2024-05-06 MED ADMIN — Apixaban Tab 5 MG: 5 mg | ORAL | NDC 00003089431

## 2024-05-06 MED ADMIN — Guaifenesin Tab ER 12HR 600 MG: 600 mg | ORAL | NDC 68084057211

## 2024-05-06 MED ADMIN — Cyclobenzaprine HCl Tab 10 MG: 10 mg | ORAL | NDC 60687055811

## 2024-05-06 MED ADMIN — Furosemide Inj 10 MG/ML: 60 mg | INTRAVENOUS | NDC 71288020302

## 2024-05-06 MED ADMIN — Pregabalin Cap 75 MG: 75 mg | ORAL | NDC 60687049511

## 2024-05-06 MED ADMIN — Spironolactone Tab 25 MG: 25 mg | ORAL | NDC 60687046511

## 2024-05-06 MED ADMIN — Umeclidinium Br Aero Powd Breath Act 62.5 MCG/ACT (Base Eq): 1 | RESPIRATORY_TRACT | NDC 00173087306

## 2024-05-06 MED ADMIN — Atorvastatin Calcium Tab 80 MG (Base Equivalent): 80 mg | ORAL | NDC 00904629304

## 2024-05-06 MED ADMIN — Fluticasone Furoate-Vilanterol Aero Powd BA 200-25 MCG/ACT: 1 | RESPIRATORY_TRACT | NDC 00173088214

## 2024-05-06 MED ADMIN — Fenofibrate Tab 160 MG: 160 mg | ORAL | NDC 42858066045

## 2024-05-06 MED ADMIN — Zolpidem Tartrate Tab 5 MG: 10 mg | ORAL | NDC 00904608261

## 2024-05-06 MED ADMIN — Famotidine Tab 20 MG: 20 mg | ORAL | NDC 00904719361

## 2024-05-06 MED ADMIN — Pantoprazole Sodium EC Tab 40 MG (Base Equiv): 40 mg | ORAL | NDC 65862056099

## 2024-05-06 MED ADMIN — Fluoxetine HCl Cap 20 MG: 40 mg | ORAL | NDC 00904734661

## 2024-05-06 MED ADMIN — Acetaminophen Tab 325 MG: 650 mg | ORAL | NDC 00904677361

## 2024-05-06 MED FILL — Furosemide Inj 10 MG/ML: 60.0000 mg | INTRAMUSCULAR | Qty: 6 | Status: AC

## 2024-05-06 MED FILL — Furosemide Inj 10 MG/ML: 60.0000 mg | INTRAMUSCULAR | Qty: 6 | Status: CN

## 2024-05-06 MED FILL — Dapagliflozin Propanediol Tab 10 MG (Base Equivalent): 10.0000 mg | ORAL | Qty: 1 | Status: AC

## 2024-05-06 MED FILL — Amiodarone HCl in Dextrose 4.14% IV Soln 360 MG/200ML: 30.0000 mg/h | INTRAVENOUS | Qty: 200 | Status: AC

## 2024-05-06 NOTE — Consult Note (Signed)
 NAME:  Zachary Deleon, MRN:  [id number], DOB:  February 14, 1966, LOS: 2 ADMISSION DATE:  05/03/2024  CHIEF COMPLAINT: Respiratory failure  BRIEF SYNOPSIS 58 year old morbidly obese white male admitted for acute heart failure lower extremity swelling severe shortness of breath  History of Present Illness:  Admitted for shortness of breath Placed on BiPAP Patient with severe respiratory distress Patient is morbidly obese Baseline function is poor Poor quality of life Patient is mostly homebound does walk to the chair and back to the recliner Patient had a stroke 6 years ago Patient had COVID's 4 years ago Ever since then patient had significant decline in his respiratory status Patient does have chronic shortness of breath and dyspnea on exertion  Upon further evaluation and at bedside patient with increased work of breathing using accessory muscles Patient was able to vocalize that he does not want to be placed on ventilator Family at bedside, daughters wife and son,   Patient states he is struggling to breathe however he is thirsty I have explained to him the risks of eating and drinking    GOALS OF CARE DISCUSSION  The Clinical status was relayed to family in detail-wife daughter son  Updated and notified of patients medical condition- Patient with increased WOB and using accessory muscles to breathe Explained to family course of therapy and the modalities  Patient with Progressive multiorgan failure with a very high probablity of a very minimal chance of meaningful recovery despite all aggressive and optimal medical therapy.    Family understands the situation.  The family and patient  have consented and agreed to DNR/DNI.  At this time the family has decided to make him comfortable as he has no quality of life   I have contacted palliative care team to evaluate and assess emergently    Objective   Blood pressure (!) 160/82, pulse (!) 50, temperature 98.9 F (37.2 C),  temperature source Oral, resp. rate (!) 23, height 6' 2 (1.88 m), weight (!) 154.6 kg, SpO2 96%.    FiO2 (%):  [28 %-30 %] 30 % PEEP:  [5 cmH20] 5 cmH20 Pressure Support:  [10 cmH20-12 cmH20] 10 cmH20   Intake/Output Summary (Last 24 hours) at 05/06/2024 1354 Last data filed at 05/06/2024 1259 Gross per 24 hour  Intake 1380 ml  Output 4000 ml  Net -2620 ml   Filed Weights   05/03/24 2258 05/05/24 0347 05/06/24 0500  Weight: (!) 159.7 kg (!) 158.7 kg (!) 154.6 kg    REVIEW OF SYSTEMS Limited due to increased work of breathing PHYSICAL EXAMINATION:  GENERAL:critically ill appearing, +resp distress EYES: Pupils equal, round, reactive to light.  No scleral icterus.  MOUTH: Moist mucosal membrane on BiPAP NECK: Supple.  PULMONARY: Lungs clear to auscultation, +rhonchi, +wheezing CARDIOVASCULAR: S1 and S2.  Regular rate and rhythm GASTROINTESTINAL: Soft, nontender, -distended. Positive bowel sounds.  MUSCULOSKELETAL: edema.  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 due to his morbid obesity  Family has stated that patient would not want him to be on machines or would want to  live on machines Family has confirmed DNR/DNI They they felt that they did not want him to suffer anymore I have relayed to them that palliative care services has been consulted and will help them transition him to comfort care   Labs   CBC: Recent Labs  Lab 05/03/24 2257 05/04/24 0535 05/05/24 0322  05/05/24 1137  WBC 12.1* 8.3 11.4* 15.6*  NEUTROABS 8.7*  --   --  13.2*  HGB 9.0* 8.5* 9.0* 9.0*  HCT 29.3* 27.2* 28.2* 29.4*  MCV 81.6 80.5 80.3 81.7  PLT 134* 98* 175 240    Basic Metabolic Panel: Recent Labs  Lab 05/03/24 2257 05/04/24 0535 05/05/24 0322 05/06/24 0349  NA 131* 130* 133* 139  K 4.1 4.6 4.8 4.4  CL 95* 95* 96* 98  CO2 20* 20* 23 28  GLUCOSE 330* 447* 401* 191*  BUN 19 25* 36* 36*  CREATININE 1.47* 1.59* 1.59* 1.49*  CALCIUM  8.2* 7.9* 8.4* 8.7*  MG  --   --   --  2.1   GFR: Estimated Creatinine Clearance: 84 mL/min (A) (by C-G formula based on SCr of 1.49 mg/dL (H)). Recent Labs  Lab 05/03/24 2257 05/03/24 2258 05/04/24 0535 05/04/24 0850 05/05/24 0322 05/05/24 1137  WBC 12.1*  --  8.3  --  11.4* 15.6*  LATICACIDVEN  --  3.9* 2.4* 2.4*  --   --     Liver Function Tests: Recent Labs  Lab 05/03/24 2257 05/05/24 0322  AST 51* 55*  ALT 41 42  ALKPHOS 66 68  BILITOT 0.6 0.6  PROT 7.4 7.8  ALBUMIN 4.2 4.5   Recent Labs  Lab 05/03/24 2257  LIPASE 22   No results for input(s): AMMONIA in the last 168 hours.  ABG    Component Value Date/Time   HCO3 31.6 (H) 05/06/2024 0550   ACIDBASEDEF 0.7 05/04/2024 0535   O2SAT 67 05/06/2024 0550     Coagulation Profile: No results for input(s): INR, PROTIME in the last 168 hours.  Cardiac Enzymes: No results for input(s): CKTOTAL, CKMB, CKMBINDEX, TROPONINI in the last 168 hours.  HbA1C: Hgb A1c MFr Bld  Date/Time Value Ref Range Status  05/03/2024 10:57 PM 10.6 (H) 4.8 - 5.6 % Final    Comment:    (NOTE) Diagnosis of Diabetes The following HbA1c ranges recommended by the American Diabetes Association (ADA) may be used as an aid in the diagnosis of diabetes mellitus.  Hemoglobin             Suggested A1C NGSP%              Diagnosis  <5.7                   Non Diabetic  5.7-6.4                Pre-Diabetic  >6.4                   Diabetic  <7.0                    Glycemic control for                       adults with diabetes.      CBG: Recent Labs  Lab 05/05/24 1314 05/05/24 1656 05/05/24 2031 05/06/24 0805 05/06/24 1143  GLUCAP 314* 193* 134* 267* 303*    Allergies Allergies  Allergen Reactions   Morphine And Codeine  Sulfa Antibiotics Hives       Critical Care Time devoted to patient care services described in this note is 95 minutes.  Critical care was necessary to treat or prevent imminent or life-threatening deterioration.   PATIENT WITH VERY POOR PROGNOSIS  Patient with Multiorgan failure and at high risk for cardiac arrest and death.    Nickolas Alm Cellar, M.D.  [facility]  Medical Director [facility]

## 2024-05-06 NOTE — Plan of Care (Signed)

## 2024-05-06 NOTE — Progress Notes (Addendum)
 PROGRESS NOTE    Zachary Deleon  FMW:[id number] DOB: 01/06/1966 DOA: 05/03/2024 PCP: [name], NP  Outpatient Specialists: none ([facility])    Brief Narrative:   Zachary Deleon is a 58 y.o. male with COPD on 2 L oxygen at baseline with CHF, extensive tobacco abuse history, CAD, diabetes, carotid stenosis, hypertension, morbid obesity with a BMI of 45, who presents via EMS on 11/22 for respiratory distress.  He reports he has had progressive dyspnea over the last 3 weeks as well as a cough.  It acutely worsened over the 24 hours prior to arrival.  On EMS arrival he was 78% on home 2 L and was immediately placed on CPAP.  With EMS he was given 125 Solu-Medrol , 1 DuoNeb, 1 g magnesium. On arrival to the ED he was noted to have elevated lactic acid as well as elevated troponin and BNP.  CXR concerning for pulmonary edema.  He received IV Lasix .   At the time my evaluation he is on nasal cannula.  He is incredibly dyspneic when speaking and persistently tachypneic.  He reports that he is feeling much better and he feels his respiratory status is currently at baseline.  He denies any current chest pain but reports he has had intermittent chest pain at home.   He reports he sees multiple doctors and is on multiple medications but he is unable to provide additional information as to who he sees and what he takes.  He reports his wife is his point of contact.  He currently resides with his wife and 2 adult children    Assessment & Plan:   Principal Problem:   CHF exacerbation (HCC) Active Problems:   Obesity, Class III, BMI 40-49.9 (morbid obesity) (HCC)   History of DVT (deep vein thrombosis)   History of pulmonary embolus (PE)   Seizure disorder (HCC)   History of CVA (cerebrovascular accident)   T2DM (type 2 diabetes mellitus) (HCC)   COPD (chronic obstructive pulmonary disease) (HCC)   Essential hypertension   PAD (peripheral artery disease)   History of CEA (carotid  endarterectomy)   Heart failure with mid-range ejection fraction (HFmEF) (HCC)   OSA (obstructive sleep apnea)  # HFmid-rangeEF with exacerbation Normal EF in 2019, here 40-45, mild RV dysfunction. Complaining of several days progressive dyspnea on exertion. Mild flat trop elevation 180s, likely demand. Out 3.5 liters yesterday and 5.5 overall but worsening respiratory status today. In a-fib rvr, may be contributing - hold coreg  - continue spiro, farxiga  - give additional lasix  80 on 60 bid  # Acute hypoxic respiratory failure Required bipap initially, weaned to 3 liters yesterday, now back on bipap and dyspneic. Affirms with wife and family present he does not want intubation, is aware we would pursue comfort if fails. - steroids, lasix , bipap  # A-fib with rvr May be contributing to above decompensated chf - amio bolus and infusion - cardiology to see  # AKI vs progression to ckd Cr 1.5s, was around 1 last year - monitor while diuresing  # Anemia Borderline microcytic. Denies bleeding. Labs consistent w/ iron deficiency. Hgb stable here - monitor for now  # Thrombocytopenia Chronic low 100s. Hiv neg. Smear unremarkable - f/u  hcv - monitor  # Staph hominis bacteremia In one set of blood cultures, likely contaminant  # COPD Wheezing today, may be mediated by chf but prudent to treat - resume methylpred  # T2DM Poor control A1c 10.6. hyperglycemic - ssi q4 while npo -  glargine 50 every day  # Neuropathy - home gabapentin  # History CVA - home apixaban , statin  # History DVT/PE - cont home apixaban   # History seizure - doesn't appear currently prescribed anti-epileptic  # Morbid obesity Contributes to underlying health problems - would benefit from glp-1  # OSA Not treated - outpt f/u  # PAD # CAS History CEA in 2019 - home apixaban , statin  # Debility - PT consulted   DVT prophylaxis: apixaban  Code Status: dnr/dni, confirmed on 11/24 with wife  present and again today with family at bedside Family Communication: wife at bedside 11/25  Level of care: Stepdown Status is: Inpatient Remains inpatient appropriate because: severity of illness    Consultants:  Cardiology pccm  Procedures: None thus far  Antimicrobials:  none    Subjective: Reports improvement in dyspnea. No chest pain  Objective: Vitals:   05/06/24 0311 05/06/24 0500 05/06/24 0809 05/06/24 1153  BP: 132/85  (!) 123/108 (!) 137/107  Pulse: (!) 104  100 (!) 50  Resp: 20  20 (!) 23  Temp: 99 F (37.2 C)  98.4 F (36.9 C) 98.9 F (37.2 C)  TempSrc:   Oral Oral  SpO2: 92%  (!) 88% 91%  Weight:  (!) 154.6 kg    Height:        Intake/Output Summary (Last 24 hours) at 05/06/2024 1234 Last data filed at 05/06/2024 9295 Gross per 24 hour  Intake 1380 ml  Output 2800 ml  Net -1420 ml   Filed Weights   05/03/24 2258 05/05/24 0347 05/06/24 0500  Weight: (!) 159.7 kg (!) 158.7 kg (!) 154.6 kg    Examination:  General exam: dyspneic Respiratory system: tachypneic, exp wheeze Cardiovascular system: S1 & S2 heard, tachy. Distant heart sounds Gastrointestinal system: Abdomen is obese, soft and nontender.   Central nervous system: Alert and oriented. No focal neurological deficits. Extremities: Symmetric 5 x 5 power. Pitting edema improving Skin: No rashes, lesions or ulcers Psychiatry: Judgement and insight appear normal. Mood & affect appropriate.     Data Reviewed: I have personally reviewed following labs and imaging studies  CBC: Recent Labs  Lab 05/03/24 2257 05/04/24 0535 05/05/24 0322 05/05/24 1137  WBC 12.1* 8.3 11.4* 15.6*  NEUTROABS 8.7*  --   --  13.2*  HGB 9.0* 8.5* 9.0* 9.0*  HCT 29.3* 27.2* 28.2* 29.4*  MCV 81.6 80.5 80.3 81.7  PLT 134* 98* 175 240   Basic Metabolic Panel: Recent Labs  Lab 05/03/24 2257 05/04/24 0535 05/05/24 0322 05/06/24 0349  NA 131* 130* 133* 139  K 4.1 4.6 4.8 4.4  CL 95* 95* 96* 98  CO2  20* 20* 23 28  GLUCOSE 330* 447* 401* 191*  BUN 19 25* 36* 36*  CREATININE 1.47* 1.59* 1.59* 1.49*  CALCIUM  8.2* 7.9* 8.4* 8.7*  MG  --   --   --  2.1   GFR: Estimated Creatinine Clearance: 84 mL/min (A) (by C-G formula based on SCr of 1.49 mg/dL (H)). Liver Function Tests: Recent Labs  Lab 05/03/24 2257 05/05/24 0322  AST 51* 55*  ALT 41 42  ALKPHOS 66 68  BILITOT 0.6 0.6  PROT 7.4 7.8  ALBUMIN 4.2 4.5   Recent Labs  Lab 05/03/24 2257  LIPASE 22   No results for input(s): AMMONIA in the last 168 hours. Coagulation Profile: No results for input(s): INR, PROTIME in the last 168 hours. Cardiac Enzymes: No results for input(s): CKTOTAL, CKMB, CKMBINDEX, TROPONINI in the last 168  hours. BNP (last 3 results) Recent Labs    05/03/24 2257  PROBNP 1,811.0*   HbA1C: Recent Labs    05/03/24 2257  HGBA1C 10.6*   CBG: Recent Labs  Lab 05/05/24 1314 05/05/24 1656 05/05/24 2031 05/06/24 0805 05/06/24 1143  GLUCAP 314* 193* 134* 267* 303*   Lipid Profile: No results for input(s): CHOL, HDL, LDLCALC, TRIG, CHOLHDL, LDLDIRECT in the last 72 hours. Thyroid Function Tests: No results for input(s): TSH, T4TOTAL, FREET4, T3FREE, THYROIDAB in the last 72 hours. Anemia Panel: Recent Labs    05/05/24 1137  FERRITIN 52  TIBC 515*  IRON 40*   Urine analysis:    Component Value Date/Time   COLORURINE YELLOW (A) 05/03/2024 2257   APPEARANCEUR HAZY (A) 05/03/2024 2257   LABSPEC 1.016 05/03/2024 2257   PHURINE 5.0 05/03/2024 2257   GLUCOSEU >=500 (A) 05/03/2024 2257   HGBUR NEGATIVE 05/03/2024 2257   BILIRUBINUR NEGATIVE 05/03/2024 2257   KETONESUR NEGATIVE 05/03/2024 2257   PROTEINUR 100 (A) 05/03/2024 2257   NITRITE NEGATIVE 05/03/2024 2257   LEUKOCYTESUR NEGATIVE 05/03/2024 2257   Sepsis Labs: @LABRCNTIP (procalcitonin:4,lacticidven:4)  ) Recent Results (from the past 240 hours)  Resp panel by RT-PCR (RSV, Flu A&B, Covid)  Anterior Nasal Swab     Status: None   Collection Time: 05/03/24 10:57 PM   Specimen: Anterior Nasal Swab  Result Value Ref Range Status   SARS Coronavirus 2 by RT PCR NEGATIVE NEGATIVE Final    Comment: (NOTE) SARS-CoV-2 target nucleic acids are NOT DETECTED.  The SARS-CoV-2 RNA is generally detectable in upper respiratory specimens during the acute phase of infection. The lowest concentration of SARS-CoV-2 viral copies this assay can detect is 138 copies/mL. A negative result does not preclude SARS-Cov-2 infection and should not be used as the sole basis for treatment or other patient management decisions. A negative result may occur with  improper specimen collection/handling, submission of specimen other than nasopharyngeal swab, presence of viral mutation(s) within the areas targeted by this assay, and inadequate number of viral copies(<138 copies/mL). A negative result must be combined with clinical observations, patient history, and epidemiological information. The expected result is Negative.  Fact Sheet for Patients:  bloggercourse.com  Fact Sheet for Healthcare Providers:  seriousbroker.it  This test is no t yet approved or cleared by the [place]  FDA and  has been authorized for detection and/or diagnosis of SARS-CoV-2 by FDA under an Emergency Use Authorization (EUA). This EUA will remain  in effect (meaning this test can be used) for the duration of the COVID-19 declaration under Section 564(b)(1) of the Act, 21 U.S.C.section 360bbb-3(b)(1), unless the authorization is terminated  or revoked sooner.       Influenza A by PCR NEGATIVE NEGATIVE Final   Influenza B by PCR NEGATIVE NEGATIVE Final    Comment: (NOTE) The Xpert Xpress SARS-CoV-2/FLU/RSV plus assay is intended as an aid in the diagnosis of influenza from Nasopharyngeal swab specimens and should not be used as a sole basis for treatment. Nasal washings  and aspirates are unacceptable for Xpert Xpress SARS-CoV-2/FLU/RSV testing.  Fact Sheet for Patients: bloggercourse.com  Fact Sheet for Healthcare Providers: seriousbroker.it  This test is not yet approved or cleared by the [place]  FDA and has been authorized for detection and/or diagnosis of SARS-CoV-2 by FDA under an Emergency Use Authorization (EUA). This EUA will remain in effect (meaning this test can be used) for the duration of the COVID-19 declaration under Section 564(b)(1) of the Act, 21 U.S.C. section  360bbb-3(b)(1), unless the authorization is terminated or revoked.     Resp Syncytial Virus by PCR NEGATIVE NEGATIVE Final    Comment: (NOTE) Fact Sheet for Patients: bloggercourse.com  Fact Sheet for Healthcare Providers: seriousbroker.it  This test is not yet approved or cleared by the [place]  FDA and has been authorized for detection and/or diagnosis of SARS-CoV-2 by FDA under an Emergency Use Authorization (EUA). This EUA will remain in effect (meaning this test can be used) for the duration of the COVID-19 declaration under Section 564(b)(1) of the Act, 21 U.S.C. section 360bbb-3(b)(1), unless the authorization is terminated or revoked.  Performed at [facility], [address] [place], [place] [address]   Blood culture (routine x 2)     Status: Abnormal (Preliminary result)   Collection Time: 05/03/24 10:58 PM   Specimen: BLOOD  Result Value Ref Range Status   Specimen Description   Final    BLOOD RIGHT ANTECUBITAL Performed at [facility], [address] [place], [place] [address]    Special Requests   Final    BOTTLES DRAWN AEROBIC AND ANAEROBIC Blood Culture results may not be optimal due to an inadequate volume of blood received in culture bottles Performed at [facility], [address] [place], [place]  [address]    Culture  Setup Time   Final    Organism ID to follow GRAM POSITIVE COCCI IN BOTH AEROBIC AND ANAEROBIC BOTTLES CRITICAL RESULT CALLED TO, READ BACK BY AND VERIFIED WITH: LUM HILARIO GOWER D AT 1546 05/04/24 RAM Performed at [facility], [address] [place], [place] [address]    Culture (A)  Final    STAPHYLOCOCCUS HOMINIS THE SIGNIFICANCE OF ISOLATING THIS ORGANISM FROM A SINGLE SET OF BLOOD CULTURES WHEN MULTIPLE SETS ARE DRAWN IS UNCERTAIN. PLEASE NOTIFY THE MICROBIOLOGY DEPARTMENT WITHIN ONE WEEK IF SPECIATION AND SENSITIVITIES ARE REQUIRED. Performed at [facility], [address] [place], [place] [address]    Report Status PENDING  Incomplete  Blood Culture ID Panel (Reflexed)     Status: Abnormal   Collection Time: 05/03/24 10:58 PM  Result Value Ref Range Status   Enterococcus faecalis NOT DETECTED NOT DETECTED Final   Enterococcus Faecium NOT DETECTED NOT DETECTED Final   Listeria monocytogenes NOT DETECTED NOT DETECTED Final   Staphylococcus species DETECTED (A) NOT DETECTED Final    Comment: CRITICAL RESULT CALLED TO, READ BACK BY AND VERIFIED WITH: MADISON H., PHARM D AT 1546 05/04/24 RAM    Staphylococcus aureus (BCID) NOT DETECTED NOT DETECTED Final   Staphylococcus epidermidis NOT DETECTED NOT DETECTED Final   Staphylococcus lugdunensis NOT DETECTED NOT DETECTED Final   Streptococcus species NOT DETECTED NOT DETECTED Final   Streptococcus agalactiae NOT DETECTED NOT DETECTED Final   Streptococcus pneumoniae NOT DETECTED NOT DETECTED Final   Streptococcus pyogenes NOT DETECTED NOT DETECTED Final   A.calcoaceticus-baumannii NOT DETECTED NOT DETECTED Final   Bacteroides fragilis NOT DETECTED NOT DETECTED Final   Enterobacterales NOT DETECTED NOT DETECTED Final   Enterobacter cloacae complex NOT DETECTED NOT DETECTED Final   Escherichia coli NOT DETECTED NOT DETECTED Final   Klebsiella aerogenes NOT DETECTED NOT DETECTED Final    Klebsiella oxytoca NOT DETECTED NOT DETECTED Final   Klebsiella pneumoniae NOT DETECTED NOT DETECTED Final   Proteus species NOT DETECTED NOT DETECTED Final   Salmonella species NOT DETECTED NOT DETECTED Final   Serratia marcescens NOT DETECTED NOT DETECTED Final   Haemophilus influenzae NOT DETECTED NOT DETECTED  Final   Neisseria meningitidis NOT DETECTED NOT DETECTED Final   Pseudomonas aeruginosa NOT DETECTED NOT DETECTED Final   Stenotrophomonas maltophilia NOT DETECTED NOT DETECTED Final   Candida albicans NOT DETECTED NOT DETECTED Final   Candida auris NOT DETECTED NOT DETECTED Final   Candida glabrata NOT DETECTED NOT DETECTED Final   Candida krusei NOT DETECTED NOT DETECTED Final   Candida parapsilosis NOT DETECTED NOT DETECTED Final   Candida tropicalis NOT DETECTED NOT DETECTED Final   Cryptococcus neoformans/gattii NOT DETECTED NOT DETECTED Final    Comment: Performed at [facility], [address] [place], [place] [address]  Blood culture (routine x 2)     Status: None (Preliminary result)   Collection Time: 05/03/24 11:03 PM   Specimen: BLOOD  Result Value Ref Range Status   Specimen Description BLOOD BLOOD RIGHT FOREARM  Final   Special Requests   Final    BOTTLES DRAWN AEROBIC AND ANAEROBIC Blood Culture results may not be optimal due to an inadequate volume of blood received in culture bottles   Culture   Final    NO GROWTH 3 DAYS Performed at [facility], [address] [place], [place] [address]    Report Status PENDING  Incomplete  Respiratory (~20 pathogens) panel by PCR     Status: None   Collection Time: 05/04/24  9:57 AM   Specimen: Nasopharyngeal Swab; Respiratory  Result Value Ref Range Status   Adenovirus NOT DETECTED NOT DETECTED Final   Coronavirus 229E NOT DETECTED NOT DETECTED Final    Comment: (NOTE) The Coronavirus on the Respiratory Panel, DOES NOT test for the novel  Coronavirus (2019 nCoV)    Coronavirus HKU1 NOT  DETECTED NOT DETECTED Final   Coronavirus NL63 NOT DETECTED NOT DETECTED Final   Coronavirus OC43 NOT DETECTED NOT DETECTED Final   Metapneumovirus NOT DETECTED NOT DETECTED Final   Rhinovirus / Enterovirus NOT DETECTED NOT DETECTED Final   Influenza A NOT DETECTED NOT DETECTED Final   Influenza B NOT DETECTED NOT DETECTED Final   Parainfluenza Virus 1 NOT DETECTED NOT DETECTED Final   Parainfluenza Virus 2 NOT DETECTED NOT DETECTED Final   Parainfluenza Virus 3 NOT DETECTED NOT DETECTED Final   Parainfluenza Virus 4 NOT DETECTED NOT DETECTED Final   Respiratory Syncytial Virus NOT DETECTED NOT DETECTED Final   Bordetella pertussis NOT DETECTED NOT DETECTED Final   Bordetella Parapertussis NOT DETECTED NOT DETECTED Final   Chlamydophila pneumoniae NOT DETECTED NOT DETECTED Final   Mycoplasma pneumoniae NOT DETECTED NOT DETECTED Final    Comment: Performed at [facility], [address] [place], [place] [address]         Radiology Studies: No results found.       Scheduled Meds:  apixaban   5 mg Oral BID   atorvastatin   80 mg Oral Daily   cyclobenzaprine   10 mg Oral BID   [START ON 05/07/2024] dapagliflozin  propanediol  10 mg Oral Daily   famotidine   20 mg Oral BID   fenofibrate   160 mg Oral q morning   FLUoxetine   40 mg Oral q AM   fluticasone  furoate-vilanterol  1 puff Inhalation Daily   furosemide   60 mg Intravenous BID   furosemide   80 mg Intravenous STAT   guaiFENesin   600 mg Oral BID   insulin  aspart  0-20 Units Subcutaneous TID WC   insulin  aspart  0-5 Units Subcutaneous QHS   insulin  aspart  15 Units Subcutaneous TID WC   insulin   glargine-yfgn  50 Units Subcutaneous Q24H   ipratropium-albuterol   3 mL Nebulization TID   methylPREDNISolone  (SOLU-MEDROL ) injection  40 mg Intravenous Once   pantoprazole   40 mg Oral q AM   pregabalin   75 mg Oral TID   spironolactone   25 mg Oral Daily   umeclidinium bromide   1 puff Inhalation Daily   Continuous  Infusions:   LOS: 2 days   CRITICAL CARE Performed by: Devaughn KATHEE Ban   Total critical care time: 59 minutes  Critical care time was exclusive of separately billable procedures and treating other patients.  Critical care was necessary to treat or prevent imminent or life-threatening deterioration.  Critical care was time spent personally by me on the following activities: development of treatment plan with patient and/or surrogate as well as nursing, discussions with consultants, evaluation of patient's response to treatment, examination of patient, obtaining history from patient or surrogate, ordering and performing treatments and interventions, ordering and review of laboratory studies, ordering and review of radiographic studies, pulse oximetry and re-evaluation of patient's condition.   Devaughn KATHEE Ban, MD [facility]   If 7PM-7AM, please contact night-coverage [email or web address] Password [facility] 05/06/2024, 12:34 PM

## 2024-05-06 NOTE — Plan of Care (Signed)
   Problem: Education: Goal: Knowledge of General Education information will improve Description Including pain rating scale, medication(s)/side effects and non-pharmacologic comfort measures Outcome: Progressing

## 2024-05-06 NOTE — Care Management Important Message (Signed)
 Important Message  Patient Details  Name: Zachary Deleon MRN: [id number] Date of Birth: [birth year]   Important Message Given:  Yes - Medicare IM     [name] 05/06/2024, 1:57 PM

## 2024-05-06 NOTE — Plan of Care (Signed)
   Problem: Education: Goal: Ability to describe self-care measures that may prevent or decrease complications (Diabetes Survival Skills Education) will improve Outcome: Progressing   Problem: Coping: Goal: Ability to adjust to condition or change in health will improve Outcome: Progressing   Problem: Fluid Volume: Goal: Ability to maintain a balanced intake and output will improve Outcome: Progressing

## 2024-05-06 NOTE — Progress Notes (Signed)
 Made MD Wouk aware of pt status. Increased BP and HR. Working to breath. Pt on and off Bipap wont keep it on for a long period time. Doctor at bedside. Orders in

## 2024-05-06 NOTE — Progress Notes (Signed)
 Mobility Specialist - Progress Note   05/06/24 1646  Mobility  Activity Ambulated with assistance;Stood at bedside;Pivoted/transferred from bed to chair  Level of Assistance Contact guard assist, steadying assist  Assistive Device None;Cane  Distance Ambulated (ft) 6 ft  Range of Motion/Exercises Active;All extremities  Activity Response Tolerated fair  Mobility visit 1 Mobility  Mobility Specialist Start Time (ACUTE ONLY) 1623  Mobility Specialist Stop Time (ACUTE ONLY) 1635  Mobility Specialist Time Calculation (min) (ACUTE ONLY) 12 min   Pt was supine in bed with the HOB elevated on Bipap and guest in the room upon entry. Pt was able to get to the EOB with maxA CGA (+2). Pt is able today to STS with maxA CGA for safety. Pt was able to ambulate a few steps and then reposition in the recliner. Pt is in the recliner with needs in reach and still on Bipap.  Clem Rodes Mobility Specialist 05/06/24, 4:50 PM

## 2024-05-06 NOTE — Progress Notes (Signed)
 OT Cancellation Note  Patient Details Name: Zachary Deleon MRN: [id number] DOB: [birth year]   Cancelled Treatment:    Reason Eval/Treat Not Completed: Medical issues which prohibited therapy. Pt currently on BiPAP with elevated BP (123/108). OT will hold treatment at this time and see as able/appropriate.   Litzy Dicker L. Maevyn Riordan, OTR/L  05/06/24, 10:15 AM

## 2024-05-06 NOTE — Progress Notes (Signed)
 Patient placed on night time Bipap around 10pm. Patient has been short of breath with moderate to severe dyspnea on exertion. Bilateral breath sounds reveal expiratory wheezes and rales. SpO2 95%, RR-28-32.  Patient has been agitated and noncompliant with the bipap, pulling mask off several times through the shift. Multiple trips made to patient room to adjust mask and encourage patient not to pull at the mask. Breathing becomes significantly more labored when patient removes the bipap. Spoke with RN and MD about the situation. VBG ordered. Awaiting results. Will continue to monitor on Bipap.

## 2024-05-06 NOTE — Plan of Care (Signed)
                                                     Palliative Care Progress Note   Patient Name: Zachary Deleon       Date: 05/06/2024 DOB: Sep 17, 1965  Age: 58 y.o. MRN#: [id number] Attending Physician: Kandis Devaughn Sayres, MD Primary Care Physician: [name], NP Admit Date: 05/03/2024  Referral received from CCM Dr. Isaiah.  Chart reviewed.  Counseled with attending Dr. Kandis.  As per attending, no palliative consult needed at this time.  With attending's approval, PMT consult discontinued.    Please reconsult if goals change or at patient/family's request.  Thank you for allowing the Palliative Medicine Team to assist in the care of [facility].  Lamarr L. Arvid, DNP, FNP-BC Palliative Medicine Team  No charge

## 2024-05-06 NOTE — Consult Note (Signed)
 [facility] CLINIC CARDIOLOGY CONSULT NOTE       Patient ID: Zachary Deleon MRN: [id number] DOB/AGE: [birth year] 58 y.o.  Admit date: 05/03/2024 Referring Physician Dr. Devaughn Ban Primary Physician Baldwin, Grayce Livings, NP  Primary Cardiologist None Reason for Consultation AoCHF  HPI: Zachary Deleon is a 58 y.o. male  with a past medical history of  COPD on 2 L oxygen at baseline with CHF, extensive tobacco abuse history, CAD, diabetes, carotid stenosis, hypertension, morbid obesity who presented to the ED on 05/03/2024 for SOB, cough. Treated for CHF exacerbation, initially was improving but today with worsening status. Cardiology was consulted for further evaluation.   Patient initially presented to the ED due to worsening shortness of breath for a few days.  Throughout admission has been diuresed and treated for acute heart failure as well as possible COPD exacerbation.  Was improving yesterday but today he is with worsening respiratory status-increased work of breathing requiring BiPAP.  Labs today notable for creatinine 1.49, potassium 4.4, yesterday hemoglobin 9.0, WBC 15.6. On admit troponins 180 > 181, BNP 1811. EKG in the ED sinus tach, PVC rate 101 bpm. EKG today atrial fibrillation RVR rate 138 bpm.   At the time of my evaluation this afternoon, patient is resting in hospital bed sitting upright with family at bedside.  We discussed his presentation in further detail.  He endorses 3 days of shortness of breath prior to presentation.  Also endorses associated lower extremity edema.  States that he would have some mild chest discomfort but this was only associated with episodes of coughing.  He states that he has had episodes similar to this in the past but none this severe.  Has been requiring BiPAP but gets uncomfortable with this and takes mask off.  He denies any history of atrial fibrillation, telemetry this afternoon shows A-fib RVR.  He states that he has a history of stroke which was  diagnosed in 2019, he has been taking Eliquis  since that time.  Review of systems complete and found to be negative unless listed above    Past Medical History:  Diagnosis Date   CHF (congestive heart failure) (HCC)    COPD (chronic obstructive pulmonary disease) (HCC)    Diabetes mellitus without complication (HCC)    Stroke (HCC)      Medications Prior to Admission  Medication Sig Dispense Refill Last Dose/Taking   acetaminophen  (TYLENOL ) 325 MG tablet Take 650 mg by mouth every 6 (six) hours as needed.   Past Month   albuterol  (VENTOLIN  HFA) 108 (90 Base) MCG/ACT inhaler Inhale 2 puffs into the lungs every 6 (six) hours as needed for wheezing or shortness of breath.   Past Month   amLODipine  (NORVASC ) 10 MG tablet Take 10 mg by mouth at bedtime.   Past Week   apixaban  (ELIQUIS ) 5 MG TABS tablet Take 5 mg by mouth 2 (two) times daily.   Past Week   atorvastatin  (LIPITOR ) 80 MG tablet Take 80 mg by mouth daily.   Past Week   cyclobenzaprine  (FLEXERIL ) 10 MG tablet Take 10 mg by mouth 2 (two) times daily.   Past Week   famotidine  (PEPCID ) 20 MG tablet Take 20 mg by mouth 2 (two) times daily.   Past Week   fenofibrate  160 MG tablet Take 160 mg by mouth every morning.   Past Week   FLUoxetine  (PROZAC ) 40 MG capsule Take 40 mg by mouth in the morning.   Past Week   fluticasone -salmeterol (ADVAIR DISKUS) 250-50 MCG/ACT  AEPB Inhale 1 puff into the lungs in the morning and at bedtime.   Past Week   insulin  aspart (NOVOLOG ) 100 UNIT/ML injection Inject 20 Units into the skin 3 (three) times daily before meals.   Past Week   insulin  glargine, 1 Unit Dial, (TOUJEO  SOLOSTAR) 300 UNIT/ML Solostar Pen Inject 65 Units into the skin daily.   Past Week   linagliptin (TRADJENTA) 5 MG TABS tablet Take 5 mg by mouth in the morning.   Past Week   loratadine (CLARITIN) 10 MG tablet Take 10 mg by mouth in the morning.   Past Week   losartan (COZAAR) 50 MG tablet Take 75 mg by mouth in the morning.   Past  Week   metFORMIN (GLUCOPHAGE) 500 MG tablet Take 1,000 mg by mouth 2 (two) times daily with a meal.   Past Week   Multiple Vitamin (MULTIVITAMIN WITH MINERALS) TABS tablet Take 1 tablet by mouth in the morning.   Past Week   pantoprazole  (PROTONIX ) 40 MG tablet Take 40 mg by mouth in the morning.   Past Week   pregabalin  (LYRICA ) 75 MG capsule Take 75 mg by mouth 3 (three) times daily.   Past Week   Tiotropium Bromide  (SPIRIVA HANDIHALER) 18 MCG CAPS Place 1 capsule into inhaler and inhale in the morning.   Past Week   sennosides-docusate sodium (SENOKOT-S) 8.6-50 MG tablet Take 1 tablet by mouth daily. (Patient not taking: Reported on 05/04/2024)   Not Taking   Social History   Socioeconomic History   Marital status: Married    Spouse name: Not on file   Number of children: Not on file   Years of education: Not on file   Highest education level: Not on file  Occupational History   Not on file  Tobacco Use   Smoking status: Former    Types: Cigarettes   Smokeless tobacco: Not on file  Substance and Sexual Activity   Alcohol use: Not Currently   Drug use: Not Currently   Sexual activity: Not on file  Other Topics Concern   Not on file  Social History Narrative   Not on file   Social Drivers of Health   Financial Resource Strain: Low Risk  (05/06/2024)   Overall Financial Resource Strain (CARDIA)    Difficulty of Paying Living Expenses: Not hard at all  Food Insecurity: No Food Insecurity (05/04/2024)   Hunger Vital Sign    Worried About Running Out of Food in the Last Year: Never true    Ran Out of Food in the Last Year: Never true  Transportation Needs: No Transportation Needs (05/04/2024)   PRAPARE - Administrator, Civil Service (Medical): No    Lack of Transportation (Non-Medical): No  Physical Activity: Not on file  Stress: Not on file  Social Connections: Moderately Integrated (05/04/2024)   Social Connection and Isolation Panel    Frequency of  Communication with Friends and Family: More than three times a week    Frequency of Social Gatherings with Friends and Family: Twice a week    Attends Religious Services: 1 to 4 times per year    Active Member of [employer] or Organizations: No    Attends Banker Meetings: Patient unable to answer    Marital Status: Married  Catering Manager Violence: Not At Risk (05/04/2024)   Humiliation, Afraid, Rape, and Kick questionnaire    Fear of Current or Ex-Partner: No    Emotionally Abused: No  Physically Abused: No    Sexually Abused: No    No family history on file.   Vitals:   05/06/24 0311 05/06/24 0500 05/06/24 0809 05/06/24 1153  BP: 132/85  (!) 123/108 (!) 137/107  Pulse: (!) 104  100 (!) 50  Resp: 20  20 (!) 23  Temp: 99 F (37.2 C)  98.4 F (36.9 C) 98.9 F (37.2 C)  TempSrc:   Oral Oral  SpO2: 92%  (!) 88% 91%  Weight:  (!) 154.6 kg    Height:        PHYSICAL EXAM General: Ill appearing male, well nourished. Morbidly obese. HEENT: Normocephalic and atraumatic. Neck: No JVD.  Lungs: Increased WOB on BiPAP. Breath sounds diminished bilaterally. Heart: Irregularly irregular, elevated rate. Normal S1 and S2 without gallops or murmurs.  Abdomen: Non-distended appearing.  Msk: Normal strength and tone for age. Extremities: Warm and well perfused. No clubbing, cyanosis. 1+ pitting edema bilaterally.  Neuro: Alert and oriented X 3. Psych: Answers questions appropriately.   Labs: Basic Metabolic Panel: Recent Labs    05/05/24 0322 05/06/24 0349  NA 133* 139  K 4.8 4.4  CL 96* 98  CO2 23 28  GLUCOSE 401* 191*  BUN 36* 36*  CREATININE 1.59* 1.49*  CALCIUM  8.4* 8.7*  MG  --  2.1   Liver Function Tests: Recent Labs    05/03/24 2257 05/05/24 0322  AST 51* 55*  ALT 41 42  ALKPHOS 66 68  BILITOT 0.6 0.6  PROT 7.4 7.8  ALBUMIN 4.2 4.5   Recent Labs    05/03/24 2257  LIPASE 22   CBC: Recent Labs    05/03/24 2257 05/04/24 0535 05/05/24 0322  05/05/24 1137  WBC 12.1*   < > 11.4* 15.6*  NEUTROABS 8.7*  --   --  13.2*  HGB 9.0*   < > 9.0* 9.0*  HCT 29.3*   < > 28.2* 29.4*  MCV 81.6   < > 80.3 81.7  PLT 134*   < > 175 240   < > = values in this interval not displayed.   Cardiac Enzymes: No results for input(s): CKTOTAL, CKMB, CKMBINDEX, TROPONINIHS in the last 72 hours. BNP: No results for input(s): BNP in the last 72 hours. D-Dimer: No results for input(s): DDIMER in the last 72 hours. Hemoglobin A1C: Recent Labs    05/03/24 2257  HGBA1C 10.6*   Fasting Lipid Panel: No results for input(s): CHOL, HDL, LDLCALC, TRIG, CHOLHDL, LDLDIRECT in the last 72 hours. Thyroid Function Tests: No results for input(s): TSH, T4TOTAL, T3FREE, THYROIDAB in the last 72 hours.  Invalid input(s): FREET3 Anemia Panel: Recent Labs    05/05/24 1137  FERRITIN 52  TIBC 515*  IRON 40*     Radiology: ECHOCARDIOGRAM COMPLETE Result Date: 05/04/2024    ECHOCARDIOGRAM REPORT   Patient Name:   Zachary Deleon Date of Exam: 05/04/2024 Medical Rec #:  [id number]    Height:       74.0 in Accession #:    [id number]   Weight:       352.0 lb Date of Birth:  [birth year]    BSA:          2.764 m Patient Age:    58 years     BP:           148/85 mmHg Patient Gender: M            HR:           109 bpm.  Exam Location:  [facility] Procedure: 2D Echo, Cardiac Doppler, Color Doppler and Intracardiac            Opacification Agent (Both Spectral and Color Flow Doppler were            utilized during procedure). STAT ECHO Indications:     CHF I50.31  History:         Patient has no prior history of Echocardiogram examinations.  Sonographer:     Thedora Louder RDCS, FASE Referring Phys:  [id number] LORANE POLAND Diagnosing Phys: Denyse Bathe  Sonographer Comments: Technically challenging study due to limited acoustic windows, suboptimal apical window, no subcostal window and patient is obese. Image acquisition challenging due to patient body  habitus and Image acquisition challenging due to respiratory motion. IMPRESSIONS  1. Left ventricular ejection fraction, by estimation, is 40 to 45%. The left ventricle has mildly decreased function. The left ventricle demonstrates global hypokinesis. The left ventricular internal cavity size was mildly to moderately dilated. There is moderate concentric left ventricular hypertrophy. Left ventricular diastolic parameters were normal.  2. Right ventricular systolic function is low normal. The right ventricular size is mildly enlarged. Mildly increased right ventricular wall thickness.  3. Left atrial size was mild to moderately dilated.  4. Right atrial size was mild to moderately dilated.  5. The mitral valve is grossly normal. Trivial mitral valve regurgitation.  6. The aortic valve is grossly normal. Aortic valve regurgitation is mild. FINDINGS  Left Ventricle: Left ventricular ejection fraction, by estimation, is 40 to 45%. The left ventricle has mildly decreased function. The left ventricle demonstrates global hypokinesis. Definity contrast agent was given IV to delineate the left ventricular  endocardial borders. Strain was performed and the global longitudinal strain is indeterminate. The left ventricular internal cavity size was mildly to moderately dilated. There is moderate concentric left ventricular hypertrophy. Left ventricular diastolic parameters were normal. Right Ventricle: The right ventricular size is mildly enlarged. Mildly increased right ventricular wall thickness. Right ventricular systolic function is low normal. Left Atrium: Left atrial size was mild to moderately dilated. Right Atrium: Right atrial size was mild to moderately dilated. Pericardium: Trivial pericardial effusion is present. The pericardial effusion is circumferential. Mitral Valve: The mitral valve is grossly normal. Trivial mitral valve regurgitation. Tricuspid Valve: The tricuspid valve is grossly normal. Tricuspid valve  regurgitation is trivial. Aortic Valve: The aortic valve is grossly normal. Aortic valve regurgitation is mild. Pulmonic Valve: The pulmonic valve was grossly normal. Pulmonic valve regurgitation is trivial. Aorta: The aortic root, ascending aorta and aortic arch are all structurally normal, with no evidence of dilitation or obstruction. IAS/Shunts: No atrial level shunt detected by color flow Doppler. Additional Comments: 3D was performed not requiring image post processing on an independent workstation and was indeterminate.  LEFT VENTRICLE PLAX 2D LVIDd:         6.90 cm      Diastology LVIDs:         5.50 cm      LV e' lateral:   10.90 cm/s LV PW:         1.00 cm      LV E/e' lateral: 8.3 LV IVS:        1.10 cm LVOT diam:     2.20 cm LVOT Area:     3.80 cm  LV Volumes (MOD) LV vol d, MOD A2C: 200.0 ml LV vol d, MOD A4C: 175.0 ml LV vol s, MOD A2C: 114.0 ml LV vol s, MOD A4C: 105.0  ml LV SV MOD A2C:     86.0 ml LV SV MOD A4C:     175.0 ml LV SV MOD BP:      74.5 ml LEFT ATRIUM           Index LA Vol (A4C): 59.0 ml 21.35 ml/m                        PULMONIC VALVE AORTA                 PV Vmax:        1.04 m/s Ao Root diam: 3.20 cm PV Peak grad:   4.3 mmHg Ao Asc diam:  2.90 cm RVOT Peak grad: 3 mmHg  MITRAL VALVE MV Area (PHT): 4.19 cm    SHUNTS MV Decel Time: 181 msec    Systemic Diam: 2.20 cm MV E velocity: 90.70 cm/s MV A velocity: 78.60 cm/s MV E/A ratio:  1.15 Shaukat Khan Electronically signed by Denyse Bathe Signature Date/Time: 05/04/2024/1:21:33 PM    Final    DG Chest Portable 1 View Result Date: 05/03/2024 EXAM: 1 VIEW(S) XRAY OF THE CHEST 05/03/2024 11:19:00 PM COMPARISON: None available. CLINICAL HISTORY: shortness of breath FINDINGS: LUNGS AND PLEURA: Elevated right hemidiaphragm and low lung volumes. Mild pulmonary edema. No pleural effusion. No pneumothorax. HEART AND MEDIASTINUM: Cardiomegaly. BONES AND SOFT TISSUES: No acute osseous abnormality. IMPRESSION: 1. Mild pulmonary edema. 2.  Cardiomegaly. Electronically signed by: Oneil Devonshire MD 05/03/2024 11:26 PM EST RP Workstation: [id number]    ECHO as above  TELEMETRY (personally reviewed): atrial fibrillation rate 130s  EKG (personally reviewed): sinus tach, PVC rate 101 bpm, repeat today with AF RVR rate 138 bpm  Data reviewed by me 05/06/2024: last 24h vitals tele labs imaging I/O ED provider note, admission H&P  Principal Problem:   CHF exacerbation (HCC) Active Problems:   Obesity, Class III, BMI 40-49.9 (morbid obesity) (HCC)   History of DVT (deep vein thrombosis)   History of pulmonary embolus (PE)   Seizure disorder (HCC)   History of CVA (cerebrovascular accident)   T2DM (type 2 diabetes mellitus) (HCC)   COPD (chronic obstructive pulmonary disease) (HCC)   Essential hypertension   PAD (peripheral artery disease)   History of CEA (carotid endarterectomy)   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 male  with a past medical history of  COPD on 2 L oxygen at baseline with CHF, extensive tobacco abuse history, CAD, diabetes, carotid stenosis, hypertension, morbid obesity who presented to the ED on 05/03/2024 for SOB, cough. Treated for CHF exacerbation, initially was improving but today with worsening status. Cardiology was consulted for further evaluation.   # Acute on chronic HFmrEF # Atrial fibrillation RVR, new onset # Hypertension # COPD # Demand ischemia Patient initially presented for worsening shortness of breath and was treated with IV diuresis.  Respiratory status worsening today and requiring BiPAP.  Noted to have tachycardia and telemetry/EKG consistent with atrial fibrillation RVR.  Previously on anticoagulation for prior DVT/PE but no history of A-fib. - Start IV amiodarone  bolus and infusion.  Can consider IV digoxin for further rate control.  Would avoid beta-blockade due to acute  decompensated heart failure.  Would not be candidate for cardioversion today as he ate breakfast.  Can consider tomorrow pending respiratory status. - Continue Eliquis  5 mg twice daily for stroke  risk reduction.  If unable to take p.o. meds due to requiring BiPAP then would switch to IV anticoagulation. - Agree with IV Lasix  80 mg x 1 today.  Will continue IV Lasix  60 mg twice daily. - Continue Farxiga  10 mg daily, spironolactone  25 mg daily to help augment diuresis.  Further additions to GDMT as able.  Was on Coreg  previously but this is held due to acute decompensated heart failure. - Continue atorvastatin  80 mg daily. - Mild and flat troponin most consistent with demand/supply mismatch and not ACS in the setting of AoCHF. - I did discuss CODE STATUS with patient and family.  He confirms that he is DNR/DNI - would not want CPR or intubation.  Primary team has also discussed with patient and he has stated that if he continues to decline would consider comfort measures.   This patient's plan of care was discussed and created with Dr. Custovic and she is in agreement.  Signed: Danita Bloch, PA-C  05/06/2024, 1:38 PM [facility]

## 2024-05-06 NOTE — Progress Notes (Signed)
 Heart Failure Stewardship Pharmacy Note  PCP: [name], NP PCP-Cardiologist: None  HPI: Zachary Deleon is a 58 y.o. male with COPD on chronic 2L O2, tobacco use, CAD, T2DM, carotid stenosis, HTN, OSA, seizure disorder who presented with progressive dyspnea and cough over 3 weeks. On admission, proBNP was 1811, HS-troponin was 180, lactic acid was 3.9, and A1c was 10.6. Chest x-ray noted mild pulmonary edema. TTE 05/04/24 showed LVEF of 40-45%, mild to moderate LV dilation, global hypokinesis, low normal RV function.   Pertinent Lab Values: Creatinine, Ser  Date Value Ref Range Status  05/06/2024 1.49 (H) 0.61 - 1.24 mg/dL Final   BUN  Date Value Ref Range Status  05/06/2024 36 (H) 6 - 20 mg/dL Final   Potassium  Date Value Ref Range Status  05/06/2024 4.4 3.5 - 5.1 mmol/L Final   Sodium  Date Value Ref Range Status  05/06/2024 139 135 - 145 mmol/L Final   Magnesium  Date Value Ref Range Status  05/06/2024 2.1 1.7 - 2.4 mg/dL Final    Comment:    Performed at [facility], [address] [place], [place] [address]   Hgb A1c MFr Bld  Date Value Ref Range Status  05/03/2024 10.6 (H) 4.8 - 5.6 % Final    Comment:    (NOTE) Diagnosis of Diabetes The following HbA1c ranges recommended by the American Diabetes Association (ADA) may be used as an aid in the diagnosis of diabetes mellitus.  Hemoglobin             Suggested A1C NGSP%              Diagnosis  <5.7                   Non Diabetic  5.7-6.4                Pre-Diabetic  >6.4                   Diabetic  <7.0                   Glycemic control for                       adults with diabetes.      Vital Signs: Temp:  [97.8 F (36.6 C)-99 F (37.2 C)] 98.4 F (36.9 C) (11/25 0809) Pulse Rate:  [98-112] 100 (11/25 0809) Cardiac Rhythm: Atrial fibrillation (11/25 0800) Resp:  [17-22] 20 (11/25 0809) BP: (122-157)/(66-119) 123/108 (11/25 0809) SpO2:  [88 %-100 %] 88 % (11/25 0809) FiO2  (%):  [28 %-30 %] 30 % (11/25 0804) Weight:  [154.6 kg (340 lb 13.3 oz)] 154.6 kg (340 lb 13.3 oz) (11/25 0500)  Intake/Output Summary (Last 24 hours) at 05/06/2024 0817 Last data filed at 05/06/2024 9295 Gross per 24 hour  Intake 1380 ml  Output 2800 ml  Net -1420 ml    Current Heart Failure Medications:  Loop diuretic: furosemide  60 mg IV BID Beta-Blocker: none ACEI/ARB/ARNI: none MRA: spironolactone  25 mg daily SGLT2i: Farxiga  10 mg daily Other: none  Prior to admission Heart Failure Medications:  No PTA CHF medications  Assessment: 1. 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 NYHA class IV symptoms.  -Symptoms: Patient is short of breath at rest and belly-breathing. Patient has multiple potential causes of respiratory distress, all of which are likely contributing. Viral  panel was negative. -Volume: Appears hypervolemic, though hard to assess due to body habitus. Decent urine output yesterday. Creatinine is improving  slightly with diuresis. Furosemide recently increased to 60 mg IV BID. -Hemodynamics: BP is elevated. EKG In ED reported as sinus tachycardia, repeat today is pending upload. -BB: Carvedilol has been stopped due to volume overload, lactic acidosis, and COPD. Would not restart any beta blocker until euvolemic and stable. When beta blocker is restarted, would consider bisoprolol. -ACEI/ARB/ARNI: Consider starting Entresto 24-26 mg BID. In addition to improving CHF, it will augment diuresis. -MRA: Continue spironolactone 25 mg daily -SGLT2i: Continue Farxiga 10 mg daily  Plan: 1) Medication changes recommended at this time: -Consider starting Entresto 24-26 mg BID  2) Patient assistance: -Copay for Entresto, Farxiga, and Jardiance are $0  3) Education: - Patient has been educated on current HF medications and potential additions to HF medication regimen - Patient verbalizes  understanding that over the next few months, these medication doses may change and more medications may be added to optimize HF regimen - Patient has been educated on basic disease state pathophysiology and goals of therapy  Medication Assistance / Insurance Benefits Check: Does the patient have prescription insurance?    Please do not hesitate to reach out with questions or concerns,  Jaun Bash, PharmD, CPP, BCPS, [facility] Heart Failure Pharmacist  Phone - [phone] 05/06/2024 1:33 PM

## 2024-05-06 NOTE — TOC Initial Note (Signed)
 Transition of Care [facility]) - Initial/Assessment Note    Patient Details  Name: Zachary Deleon MRN: [id number] Date of Birth: 09/20/1965  Transition of Care [facility]) CM/SW Contact:    Alfonso Rummer, LCSW Phone Number: 05/06/2024, 4:02 PM  Clinical Narrative:                  LCSW A. Rummer met with patient, spouse and children at bedside in room 240. Pt reports he lives with family and has adequate family support. Pt report he need a bedside commode, portable oxygen, and bipap machine. Pt is requesting home health nursing in the home.        Patient Goals and CMS Choice            Expected Discharge Plan and Services                                              Prior Living Arrangements/Services                       Activities of Daily Living   ADL Screening (condition at time of admission) Independently performs ADLs?: Yes (appropriate for developmental age) Is the patient deaf or have difficulty hearing?: No Does the patient have difficulty seeing, even when wearing glasses/contacts?: No Does the patient have difficulty concentrating, remembering, or making decisions?: No  Permission Sought/Granted                  Emotional Assessment              Admission diagnosis:  Acute pulmonary edema (HCC) [J81.0] Respiratory distress [R06.03] COPD exacerbation (HCC) [J44.1] CHF exacerbation (HCC) [I50.9] Patient Active Problem List   Diagnosis Date Noted   Obesity, Class III, BMI 40-49.9 (morbid obesity) (HCC) 05/05/2024   History of DVT (deep vein thrombosis) 05/05/2024   History of pulmonary embolus (PE) 05/05/2024   Seizure disorder (HCC) 05/05/2024   History of CVA (cerebrovascular accident) 05/05/2024   T2DM (type 2 diabetes mellitus) (HCC) 05/05/2024   COPD (chronic obstructive pulmonary disease) (HCC) 05/05/2024   Essential hypertension 05/05/2024   PAD (peripheral artery disease) 05/05/2024   History of CEA (carotid  endarterectomy) 05/05/2024   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 [place] [address] Phone: [phone] Fax: [phone]     Social Drivers of Health (SDOH) Social History: SDOH Screenings   Food Insecurity: No Food Insecurity (05/04/2024)  Housing: Low Risk  (05/04/2024)  Transportation Needs: No Transportation Needs (05/04/2024)  Utilities: Not At Risk (05/04/2024)  Financial Resource Strain: Low Risk  (05/06/2024)  Social Connections: Moderately Integrated (05/04/2024)  Tobacco Use: Medium Risk (05/03/2024)   SDOH Interventions:     Readmission Risk Interventions     No data to display

## 2024-05-06 NOTE — Progress Notes (Signed)
 Heart Failure Nurse Navigator Progress Note  PCP: [name], NP PCP-Cardiologist: None Admission Diagnosis: COPD exacerbation Acute pulmonary edema (HCC) Respiratory distress Admitted from: Home via [facility]  Presentation:   Zachary Deleon is a 58 y.o. male.  He presented with respiratory distress and cough. Patient was 80% at home and 94% on CPAP.  History of smoking,COPD, CHF, DM, CVA, Carotid Stenosis, HTN and Morbid Obesity. BNP 1,811.  Lactic acid 3.9.  HS Troponin 180.  Chest x-ray- Mild pulmonary edema, Cardiomegaly.   ECHO/ LVEF: 40-45%  Clinical Course:  Past Medical History:  Diagnosis Date   CHF (congestive heart failure) (HCC)    COPD (chronic obstructive pulmonary disease) (HCC)    Diabetes mellitus without complication (HCC)    Stroke (HCC)      Social History   Socioeconomic History   Marital status: Married    Spouse name: Not on file   Number of children: Not on file   Years of education: Not on file   Highest education level: Not on file  Occupational History   Not on file  Tobacco Use   Smoking status: Former    Types: Cigarettes   Smokeless tobacco: Not on file  Substance and Sexual Activity   Alcohol use: Not Currently   Drug use: Not Currently   Sexual activity: Not on file  Other Topics Concern   Not on file  Social History Narrative   Not on file   Social Drivers of Health   Financial Resource Strain: Low Risk  (05/06/2024)   Overall Financial Resource Strain (CARDIA)    Difficulty of Paying Living Expenses: Not hard at all  Food Insecurity: No Food Insecurity (05/04/2024)   Hunger Vital Sign    Worried About Running Out of Food in the Last Year: Never true    Ran Out of Food in the Last Year: Never true  Transportation Needs: No Transportation Needs (05/04/2024)   PRAPARE - Administrator, Civil Service (Medical): No    Lack of Transportation (Non-Medical): No  Physical Activity: Not on file  Stress: Not on file   Social Connections: Moderately Integrated (05/04/2024)   Social Connection and Isolation Panel    Frequency of Communication with Friends and Family: More than three times a week    Frequency of Social Gatherings with Friends and Family: Twice a week    Attends Religious Services: 1 to 4 times per year    Active Member of [employer] or Organizations: No    Attends Engineer, Structural: Patient unable to answer    Marital Status: Married   Water Engineer and Provision:  Detailed education and instructions provided on heart failure disease management including the following:  Signs and symptoms of Heart Failure When to call the physician Importance of daily weights Low sodium diet Fluid restriction Medication management Anticipated future follow-up appointments  Patient education given on each of the above topics.  Patient acknowledges understanding via teach back method and acceptance of all instructions.  Education Materials:  Living Better With Heart Failure Booklet, HF zone tool, & Daily Weight Tracker Tool.  Patient has scale at home: Yes Patient has pill box at home: Yes    High Risk Criteria for Readmission and/or Poor Patient Outcomes: Heart failure hospital admissions (last 6 months): 1  No Show rate: 0% Difficult social situation: None Demonstrates medication adherence: Yes Primary Language: English Literacy level: Unable to read or write.  Needs verbal education.  Barriers of Care:  Non-Compliant with CPAP Drinks 2 -3 gallons of fluid daily Per family low salt diet Not currently doing daily weights  Considerations/Referrals:  Referral made to Heart Failure Pharmacist Stewardship: Yes Referral made to Heart Failure CSW/NCM TOC: No Referral made to Heart & Vascular TOC clinic: Yes  Items for Follow-up on DC/TOC: Daily Weights Diet & Fluid Restrictions Continued Heart Failure Education  Charmaine Pines, RN, BSN [facility] Heart Failure  Navigator Secure Chat Only

## 2024-05-07 DIAGNOSIS — I5023 Acute on chronic systolic (congestive) heart failure: Secondary | ICD-10-CM | POA: Diagnosis not present

## 2024-05-07 LAB — BASIC METABOLIC PANEL WITH GFR
Anion gap: 13 (ref 5–15)
BUN: 38 mg/dL — ABNORMAL HIGH (ref 6–20)
CO2: 28 mmol/L (ref 22–32)
Calcium: 8.6 mg/dL — ABNORMAL LOW (ref 8.9–10.3)
Chloride: 98 mmol/L (ref 98–111)
Creatinine, Ser: 1.37 mg/dL — ABNORMAL HIGH (ref 0.61–1.24)
GFR, Estimated: 59 mL/min — ABNORMAL LOW (ref >60)
Glucose, Bld: 219 mg/dL — ABNORMAL HIGH (ref 70–99)
Potassium: 4.2 mmol/L (ref 3.5–5.1)
Sodium: 139 mmol/L (ref 135–145)

## 2024-05-07 LAB — HCV INTERPRETATION

## 2024-05-07 LAB — GLUCOSE, CAPILLARY
Glucose-Capillary: 226 mg/dL — ABNORMAL HIGH (ref 70–99)
Glucose-Capillary: 209 mg/dL — ABNORMAL HIGH (ref 70–99)
Glucose-Capillary: 259 mg/dL — ABNORMAL HIGH (ref 70–99)
Glucose-Capillary: 263 mg/dL — ABNORMAL HIGH (ref 70–99)
Glucose-Capillary: 266 mg/dL — ABNORMAL HIGH (ref 70–99)

## 2024-05-07 LAB — CULTURE, BLOOD (ROUTINE X 2)

## 2024-05-07 LAB — MAGNESIUM: Magnesium: 2.5 mg/dL — ABNORMAL HIGH (ref 1.7–2.4)

## 2024-05-07 LAB — HCV AB W REFLEX TO QUANT PCR: HCV Ab: NONREACTIVE

## 2024-05-07 MED ORDER — HYALURONIDASE HUMAN 150 UNIT/ML IJ SOLN
150.0000 [IU] | Freq: Once | INTRAMUSCULAR | Status: AC
  Administered 2024-05-07: 150 [IU] via SUBCUTANEOUS
  Filled 2024-05-07: qty 1

## 2024-05-07 MED ORDER — INSULIN ASPART 100 UNIT/ML IJ SOLN
6.0000 [IU] | Freq: Three times a day (TID) | INTRAMUSCULAR | Status: DC
  Administered 2024-05-08: 6 [IU] via SUBCUTANEOUS
  Filled 2024-05-07: qty 6

## 2024-05-07 MED ADMIN — Amiodarone HCl in Dextrose 4.14% IV Soln 360 MG/200ML: 30 mg/h | INTRAVENOUS | NDC 43066036020

## 2024-05-07 MED ADMIN — Apixaban Tab 5 MG: 5 mg | ORAL | NDC 00003089431

## 2024-05-07 MED ADMIN — Guaifenesin Tab ER 12HR 600 MG: 600 mg | ORAL | NDC 68084057211

## 2024-05-07 MED ADMIN — Cyclobenzaprine HCl Tab 10 MG: 10 mg | ORAL | NDC 60687055811

## 2024-05-07 MED ADMIN — Furosemide Inj 10 MG/ML: 60 mg | INTRAVENOUS | NDC 71288020302

## 2024-05-07 MED ADMIN — Pregabalin Cap 75 MG: 75 mg | ORAL | NDC 00071101441

## 2024-05-07 MED ADMIN — Pregabalin Cap 75 MG: 75 mg | ORAL | NDC 60687049511

## 2024-05-07 MED ADMIN — Spironolactone Tab 25 MG: 25 mg | ORAL | NDC 60687046511

## 2024-05-07 MED ADMIN — Umeclidinium Br Aero Powd Breath Act 62.5 MCG/ACT (Base Eq): 1 | RESPIRATORY_TRACT | NDC 00173087306

## 2024-05-07 MED ADMIN — Atorvastatin Calcium Tab 80 MG (Base Equivalent): 80 mg | ORAL | NDC 00904629304

## 2024-05-07 MED ADMIN — Insulin Aspart Inj Soln 100 Unit/ML: 3 [IU] | SUBCUTANEOUS | NDC 73070010011

## 2024-05-07 MED ADMIN — Fluticasone Furoate-Vilanterol Aero Powd BA 200-25 MCG/ACT: 1 | RESPIRATORY_TRACT | NDC 00173088214

## 2024-05-07 MED ADMIN — Fenofibrate Tab 160 MG: 160 mg | ORAL | NDC 42858066045

## 2024-05-07 MED ADMIN — Sacubitril-Valsartan Tab 24-26 MG: 1 | ORAL | NDC 72205028018

## 2024-05-07 MED ADMIN — Famotidine Tab 20 MG: 20 mg | ORAL | NDC 00904719361

## 2024-05-07 MED ADMIN — Pantoprazole Sodium EC Tab 40 MG (Base Equiv): 40 mg | ORAL | NDC 65862056099

## 2024-05-07 MED ADMIN — Fluoxetine HCl Cap 20 MG: 40 mg | ORAL | NDC 00904734661

## 2024-05-07 MED ADMIN — Dapagliflozin Propanediol Tab 10 MG (Base Equivalent): 10 mg | ORAL | NDC 66993045730

## 2024-05-07 MED FILL — Insulin Aspart Inj Soln 100 Unit/ML: 0.0000 [IU] | INTRAMUSCULAR | Qty: 4 | Status: AC

## 2024-05-07 MED FILL — Insulin Aspart Inj Soln 100 Unit/ML: 0.0000 [IU] | INTRAMUSCULAR | Qty: 7 | Status: AC

## 2024-05-07 MED FILL — Insulin Aspart Inj Soln 100 Unit/ML: 0.0000 [IU] | INTRAMUSCULAR | Qty: 2 | Status: AC

## 2024-05-07 MED FILL — Sacubitril-Valsartan Tab 24-26 MG: 1.0000 | ORAL | Qty: 1 | Status: AC

## 2024-05-07 MED FILL — Insulin Aspart Inj Soln 100 Unit/ML: 0.0000 [IU] | INTRAMUSCULAR | Qty: 3 | Status: AC

## 2024-05-07 NOTE — Progress Notes (Signed)
 [facility] CLINIC CARDIOLOGY PROGRESS NOTE       Patient ID: Zachary Deleon MRN: [id number] DOB/AGE: 1966-02-03 58 y.o.  Admit date: 05/03/2024 Referring Physician Dr. Devaughn Ban Primary Physician Baldwin, Grayce Livings, NP  Primary Cardiologist None Reason for Consultation AoCHF  HPI: Zachary Deleon is a 58 y.o. male  with a past medical history of  COPD on 2 L oxygen at baseline with CHF, extensive tobacco abuse history, CAD, diabetes, carotid stenosis, hypertension, morbid obesity who presented to the ED on 05/03/2024 for SOB, cough. Treated for CHF exacerbation, initially was improving but today with worsening status. Cardiology was consulted for further evaluation.   Interval history: - Patient seen and examined this morning, sitting upright in bedside chair with family at bedside. - He is off of BiPAP and now doing well on 4L [place].  Work of breathing much improved. - Converted normal sinus rhythm yesterday afternoon with IV amiodarone infusion. - Great urine output yesterday.  Review of systems complete and found to be negative unless listed above    Past Medical History:  Diagnosis Date   CHF (congestive heart failure) (HCC)    COPD (chronic obstructive pulmonary disease) (HCC)    Diabetes mellitus without complication (HCC)    Stroke (HCC)      Medications Prior to Admission  Medication Sig Dispense Refill Last Dose/Taking   acetaminophen (TYLENOL) 325 MG tablet Take 650 mg by mouth every 6 (six) hours as needed.   Past Month   albuterol (VENTOLIN HFA) 108 (90 Base) MCG/ACT inhaler Inhale 2 puffs into the lungs every 6 (six) hours as needed for wheezing or shortness of breath.   Past Month   amLODipine (NORVASC) 10 MG tablet Take 10 mg by mouth at bedtime.   Past Week   apixaban (ELIQUIS) 5 MG TABS tablet Take 5 mg by mouth 2 (two) times daily.   Past Week   atorvastatin (LIPITOR) 80 MG tablet Take 80 mg by mouth daily.   Past Week   cyclobenzaprine (FLEXERIL) 10 MG tablet Take  10 mg by mouth 2 (two) times daily.   Past Week   famotidine (PEPCID) 20 MG tablet Take 20 mg by mouth 2 (two) times daily.   Past Week   fenofibrate 160 MG tablet Take 160 mg by mouth every morning.   Past Week   FLUoxetine (PROZAC) 40 MG capsule Take 40 mg by mouth in the morning.   Past Week   fluticasone-salmeterol (ADVAIR DISKUS) 250-50 MCG/ACT AEPB Inhale 1 puff into the lungs in the morning and at bedtime.   Past Week   insulin aspart (NOVOLOG) 100 UNIT/ML injection Inject 20 Units into the skin 3 (three) times daily before meals.   Past Week   insulin glargine, 1 Unit Dial, (TOUJEO SOLOSTAR) 300 UNIT/ML Solostar Pen Inject 65 Units into the skin daily.   Past Week   linagliptin (TRADJENTA) 5 MG TABS tablet Take 5 mg by mouth in the morning.   Past Week   loratadine (CLARITIN) 10 MG tablet Take 10 mg by mouth in the morning.   Past Week   losartan (COZAAR) 50 MG tablet Take 75 mg by mouth in the morning.   Past Week   metFORMIN (GLUCOPHAGE) 500 MG tablet Take 1,000 mg by mouth 2 (two) times daily with a meal.   Past Week   Multiple Vitamin (MULTIVITAMIN WITH MINERALS) TABS tablet Take 1 tablet by mouth in the morning.   Past Week   pantoprazole (PROTONIX) 40 MG tablet Take  40 mg by mouth in the morning.   Past Week   pregabalin  (LYRICA ) 75 MG capsule Take 75 mg by mouth 3 (three) times daily.   Past Week   Tiotropium Bromide  (SPIRIVA HANDIHALER) 18 MCG CAPS Place 1 capsule into inhaler and inhale in the morning.   Past Week   sennosides-docusate sodium (SENOKOT-S) 8.6-50 MG tablet Take 1 tablet by mouth daily. (Patient not taking: Reported on 05/04/2024)   Not Taking   Social History   Socioeconomic History   Marital status: Married    Spouse name: Not on file   Number of children: Not on file   Years of education: Not on file   Highest education level: Not on file  Occupational History   Not on file  Tobacco Use   Smoking status: Former    Types: Cigarettes   Smokeless tobacco:  Not on file  Substance and Sexual Activity   Alcohol use: Not Currently   Drug use: Not Currently   Sexual activity: Not on file  Other Topics Concern   Not on file  Social History Narrative   Not on file   Social Drivers of Health   Financial Resource Strain: Low Risk  (05/06/2024)   Overall Financial Resource Strain (CARDIA)    Difficulty of Paying Living Expenses: Not hard at all  Food Insecurity: No Food Insecurity (05/04/2024)   Hunger Vital Sign    Worried About Running Out of Food in the Last Year: Never true    Ran Out of Food in the Last Year: Never true  Transportation Needs: No Transportation Needs (05/04/2024)   PRAPARE - Administrator, Civil Service (Medical): No    Lack of Transportation (Non-Medical): No  Physical Activity: Not on file  Stress: Not on file  Social Connections: Moderately Integrated (05/04/2024)   Social Connection and Isolation Panel    Frequency of Communication with Friends and Family: More than three times a week    Frequency of Social Gatherings with Friends and Family: Twice a week    Attends Religious Services: 1 to 4 times per year    Active Member of [employer] or Organizations: No    Attends Banker Meetings: Patient unable to answer    Marital Status: Married  Catering Manager Violence: Not At Risk (05/04/2024)   Humiliation, Afraid, Rape, and Kick questionnaire    Fear of Current or Ex-Partner: No    Emotionally Abused: No    Physically Abused: No    Sexually Abused: No    No family history on file.   Vitals:   05/07/24 0001 05/07/24 0343 05/07/24 0424 05/07/24 0813  BP: 111/80 (!) 142/84  114/83  Pulse: 91 88  91  Resp: 20 20  (!) 22  Temp: 98.1 F (36.7 C) 98.4 F (36.9 C)  (!) 97.5 F (36.4 C)  TempSrc:      SpO2: 96% 95%  97%  Weight:   (!) 151 kg   Height:        PHYSICAL EXAM General: Ill appearing male, well nourished. Morbidly obese. HEENT: Normocephalic and atraumatic. Neck: No JVD.   Lungs: Increased WOB on BiPAP. Breath sounds diminished bilaterally. Heart: HRRR. Normal S1 and S2 without gallops or murmurs.  Abdomen: Non-distended appearing.  Msk: Normal strength and tone for age. Extremities: Warm and well perfused. No clubbing, cyanosis. 1+ pitting edema bilaterally.  Neuro: Alert and oriented X 3. Psych: Answers questions appropriately.   Labs: Basic Metabolic Panel: Recent Labs  05/06/24 0349 05/07/24 0514  NA 139 139  K 4.4 4.2  CL 98 98  CO2 28 28  GLUCOSE 191* 219*  BUN 36* 38*  CREATININE 1.49* 1.37*  CALCIUM  8.7* 8.6*  MG 2.1 2.5*   Liver Function Tests: Recent Labs    05/05/24 0322  AST 55*  ALT 42  ALKPHOS 68  BILITOT 0.6  PROT 7.8  ALBUMIN 4.5   No results for input(s): LIPASE, AMYLASE in the last 72 hours.  CBC: Recent Labs    05/05/24 0322 05/05/24 1137  WBC 11.4* 15.6*  NEUTROABS  --  13.2*  HGB 9.0* 9.0*  HCT 28.2* 29.4*  MCV 80.3 81.7  PLT 175 240   Cardiac Enzymes: No results for input(s): CKTOTAL, CKMB, CKMBINDEX, TROPONINIHS in the last 72 hours. BNP: No results for input(s): BNP in the last 72 hours. D-Dimer: Recent Labs    05/06/24 1356  DDIMER 2.58*   Hemoglobin A1C: No results for input(s): HGBA1C in the last 72 hours.  Fasting Lipid Panel: No results for input(s): CHOL, HDL, LDLCALC, TRIG, CHOLHDL, LDLDIRECT in the last 72 hours. Thyroid Function Tests: No results for input(s): TSH, T4TOTAL, T3FREE, THYROIDAB in the last 72 hours.  Invalid input(s): FREET3 Anemia Panel: Recent Labs    05/05/24 1137  FERRITIN 52  TIBC 515*  IRON 40*     Radiology: DG Chest Port 1 View Result Date: 05/06/2024 CLINICAL DATA:  Dyspnea. EXAM: PORTABLE CHEST 1 VIEW COMPARISON:  Radiographs 05/03/2024.  No other comparison studies. FINDINGS: 1304 hours. No significant change in cardiomegaly and asymmetric elevation of the right hemidiaphragm. Mild increase in generalized  interstitial prominence which may reflect worsening edema. There is no confluent airspace disease, pneumothorax or significant pleural effusion. Mild degenerative changes in the spine without evidence of acute osseous abnormality. IMPRESSION: Cardiomegaly with possible increased mild interstitial edema. No confluent airspace disease or significant pleural effusion. Electronically Signed   By: Elsie Perone M.D.   On: 05/06/2024 14:43   ECHOCARDIOGRAM COMPLETE Result Date: 05/04/2024    ECHOCARDIOGRAM REPORT   Patient Name:   Kylil Bowland Date of Exam: 05/04/2024 Medical Rec #:  [id number]    Height:       74.0 in Accession #:    [id number]   Weight:       352.0 lb Date of Birth:  27-Jul-1965    BSA:          2.764 m Patient Age:    58 years     BP:           148/85 mmHg Patient Gender: M            HR:           109 bpm. Exam Location:  [facility] Procedure: 2D Echo, Cardiac Doppler, Color Doppler and Intracardiac            Opacification Agent (Both Spectral and Color Flow Doppler were            utilized during procedure). STAT ECHO Indications:     CHF I50.31  History:         Patient has no prior history of Echocardiogram examinations.  Sonographer:     Thedora Louder RDCS, FASE Referring Phys:  [id number] LORANE POLAND Diagnosing Phys: Denyse Bathe  Sonographer Comments: Technically challenging study due to limited acoustic windows, suboptimal apical window, no subcostal window and patient is obese. Image acquisition challenging due to patient body habitus and Image acquisition challenging due to respiratory  motion. IMPRESSIONS  1. Left ventricular ejection fraction, by estimation, is 40 to 45%. The left ventricle has mildly decreased function. The left ventricle demonstrates global hypokinesis. The left ventricular internal cavity size was mildly to moderately dilated. There is moderate concentric left ventricular hypertrophy. Left ventricular diastolic parameters were normal.  2. Right ventricular systolic  function is low normal. The right ventricular size is mildly enlarged. Mildly increased right ventricular wall thickness.  3. Left atrial size was mild to moderately dilated.  4. Right atrial size was mild to moderately dilated.  5. The mitral valve is grossly normal. Trivial mitral valve regurgitation.  6. The aortic valve is grossly normal. Aortic valve regurgitation is mild. FINDINGS  Left Ventricle: Left ventricular ejection fraction, by estimation, is 40 to 45%. The left ventricle has mildly decreased function. The left ventricle demonstrates global hypokinesis. Definity contrast agent was given IV to delineate the left ventricular  endocardial borders. Strain was performed and the global longitudinal strain is indeterminate. The left ventricular internal cavity size was mildly to moderately dilated. There is moderate concentric left ventricular hypertrophy. Left ventricular diastolic parameters were normal. Right Ventricle: The right ventricular size is mildly enlarged. Mildly increased right ventricular wall thickness. Right ventricular systolic function is low normal. Left Atrium: Left atrial size was mild to moderately dilated. Right Atrium: Right atrial size was mild to moderately dilated. Pericardium: Trivial pericardial effusion is present. The pericardial effusion is circumferential. Mitral Valve: The mitral valve is grossly normal. Trivial mitral valve regurgitation. Tricuspid Valve: The tricuspid valve is grossly normal. Tricuspid valve regurgitation is trivial. Aortic Valve: The aortic valve is grossly normal. Aortic valve regurgitation is mild. Pulmonic Valve: The pulmonic valve was grossly normal. Pulmonic valve regurgitation is trivial. Aorta: The aortic root, ascending aorta and aortic arch are all structurally normal, with no evidence of dilitation or obstruction. IAS/Shunts: No atrial level shunt detected by color flow Doppler. Additional Comments: 3D was performed not requiring image post  processing on an independent workstation and was indeterminate.  LEFT VENTRICLE PLAX 2D LVIDd:         6.90 cm      Diastology LVIDs:         5.50 cm      LV e' lateral:   10.90 cm/s LV PW:         1.00 cm      LV E/e' lateral: 8.3 LV IVS:        1.10 cm LVOT diam:     2.20 cm LVOT Area:     3.80 cm  LV Volumes (MOD) LV vol d, MOD A2C: 200.0 ml LV vol d, MOD A4C: 175.0 ml LV vol s, MOD A2C: 114.0 ml LV vol s, MOD A4C: 105.0 ml LV SV MOD A2C:     86.0 ml LV SV MOD A4C:     175.0 ml LV SV MOD BP:      74.5 ml LEFT ATRIUM           Index LA Vol (A4C): 59.0 ml 21.35 ml/m                        PULMONIC VALVE AORTA                 PV Vmax:        1.04 m/s Ao Root diam: 3.20 cm PV Peak grad:   4.3 mmHg Ao Asc diam:  2.90 cm RVOT Peak grad: 3 mmHg  MITRAL  VALVE MV Area (PHT): 4.19 cm    SHUNTS MV Decel Time: 181 msec    Systemic Diam: 2.20 cm MV E velocity: 90.70 cm/s MV A velocity: 78.60 cm/s MV E/A ratio:  1.15 Shaukat Khan Electronically signed by Denyse Bathe Signature Date/Time: 05/04/2024/1:21:33 PM    Final    DG Chest Portable 1 View Result Date: 05/03/2024 EXAM: 1 VIEW(S) XRAY OF THE CHEST 05/03/2024 11:19:00 PM COMPARISON: None available. CLINICAL HISTORY: shortness of breath FINDINGS: LUNGS AND PLEURA: Elevated right hemidiaphragm and low lung volumes. Mild pulmonary edema. No pleural effusion. No pneumothorax. HEART AND MEDIASTINUM: Cardiomegaly. BONES AND SOFT TISSUES: No acute osseous abnormality. IMPRESSION: 1. Mild pulmonary edema. 2. Cardiomegaly. Electronically signed by: Oneil Devonshire MD 05/03/2024 11:26 PM EST RP Workstation: [id number]    ECHO as above  TELEMETRY (personally reviewed): Sinus rhythm rate 80-90s  EKG (personally reviewed): sinus tach, PVC rate 101 bpm, repeat today with AF RVR rate 138 bpm  Data reviewed by me 05/07/2024: last 24h vitals tele labs imaging I/O ED provider note, admission H&P, hospitalist progress note, PCCM note, palliative note  Principal Problem:   CHF  exacerbation (HCC) Active Problems:   Obesity, Class III, BMI 40-49.9 (morbid obesity) (HCC)   History of DVT (deep vein thrombosis)   History of pulmonary embolus (PE)   Seizure disorder (HCC)   History of CVA (cerebrovascular accident)   T2DM (type 2 diabetes mellitus) (HCC)   COPD (chronic obstructive pulmonary disease) (HCC)   Essential hypertension   PAD (peripheral artery disease)   History of CEA (carotid endarterectomy)   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 male  with a past medical history of  COPD on 2 L oxygen at baseline with CHF, extensive tobacco abuse history, CAD, diabetes, carotid stenosis, hypertension, morbid obesity who presented to the ED on 05/03/2024 for SOB, cough. Treated for CHF exacerbation, initially was improving but today with worsening status. Cardiology was consulted for further evaluation.   # Acute on chronic HFmrEF # Atrial fibrillation RVR, new onset # Hypertension # COPD # Demand ischemia Patient initially presented for worsening shortness of breath and was treated with IV diuresis.  Respiratory status worsening today and requiring BiPAP.  Noted to have tachycardia and telemetry/EKG consistent with atrial fibrillation RVR.  Previously on anticoagulation for prior DVT/PE but no history of A-fib. - Continue IV amiodarone  infusion. - Continue Eliquis  5 mg twice daily for stroke risk reduction.  If unable to take p.o. meds due to requiring BiPAP then would switch to IV anticoagulation. - Continue IV Lasix  60 mg twice daily. - Continue Farxiga  10 mg daily, spironolactone  25 mg daily to help augment diuresis.  Further additions to GDMT as able.  Was on Coreg  previously but this is held due to acute decompensated heart failure. - Continue atorvastatin  80 mg daily. - Mild and flat troponin most consistent with demand/supply mismatch and not ACS in the  setting of AoCHF. - I did discuss CODE STATUS with patient and family on 05/06/24.  He confirms that he is DNR/DNI - would not want CPR or intubation.  Primary team has also discussed with patient and he has stated that if he continues to decline would consider comfort measures.   This patient's plan of care was discussed and created with Dr. Custovic and she is in agreement.  SignedBETHA Danita Bloch, PA-C  05/07/2024, 8:34 AM  [facility] Cardiology

## 2024-05-07 NOTE — Hospital Course (Addendum)
 Hospital course / significant events:   Zachary Deleon is a 58 y.o. male with COPD on 2 L oxygen at baseline with CHF, extensive tobacco abuse history, CAD, diabetes, carotid stenosis, hypertension, morbid obesity with a BMI of 45, who presents via EMS on 11/22 for respiratory distress.   HPI: progressive dyspnea over the last 3 weeks as well as a cough. It acutely worsened over the 24 hours prior to arrival. On EMS arrival he was 78% on home 2 L and was immediately placed on CPAP.   11/22: to ED. Elevated lactic acid, troponin and BNP. CXR concerning for pulmonary edema. He received IV Lasix .  11/23: admitted to hospitalist. Echo pending  11/24: 3L UOP yesterday, continue diuresis.  11/25: Pt into Afib RVR started on amio bolus/drip. Cardiology consult - repeat Echo, may consider cardioversion if not improving. Still needing BiPAP. 3.5L UOP yesterday 11/26: sinus rhythm. Continuing diuresis. Off BiPAP today and onto [place]. 4L UOP yesterday 11/27: continues to diurese, 3L UOP yesterday. Amio IV --> po today  11/28: 5L UOP yesterday. Tightened fluid restriction and carb modified diet. Cardiology plan for po lasix  probably tomorrow, continue IV for now  11/29: 3L out yesterday. Cardiology recs ok to dc home today on po meds as per med rec A/P    Consultants:  Cardiology  PCCU    Procedures/Surgeries: none      ASSESSMENT & PLAN:   Acute on chronic HFmid-rangeEF with exacerbation Normal EF in 2019,  Echo this admission LVEF 40-45, mild RV dysfunction.  Lasix  to 40 mg po bid Farxiga  10 mg daily Spiro 25 mg daily  Atorvastatin  80 mg daily  Hold coreg  Cardiology following outpatient  Tight fluid restriction and cardiac/carb modified diet    Acute on chronic hypoxic respiratory failure - improved  2-3L O2 PN prn at baseline Required bipap initially, weaned to 3 liters. Affirms with wife and family present he does not want intubation, is aware we would pursue comfort if  fails. Treating CHF and COPD as noted BiPAP weaned off Continuous O2 2L [place]    A-fib with rvr - resolved RVR Amio po Eliquis    AKI vs progression to ckd Cr 1.5s, was around 1 last year Monitor BMP   Anemia Borderline microcytic. Denies bleeding. Labs consistent w/ iron deficiency. Hgb stable here monitor CBC   Thrombocytopenia Chronic low 100s. Hiv neg. Smear unremarkable f/u  hcv  Monitor CBC   Staph hominis (+)BCx likely contaminant  In one set of blood cultures, likely contaminant   COPD Wheezing today, may be mediated by chf but prudent to treat Inhalers changed to ellipta devices which have been working well here for pt    T2DM Poor control A1c 10.6. hyperglycemic Resume home meds Close PCP follow up  Tightened fluid restriction and cardiac/carb modified diet    Neuropathy home gabapentin    History CVA w/ residual L sided strength deficits home apixaban , statin   History DVT/PE cont apixaban    History seizure doesn't appear currently prescribed anti-epileptic   OSA Not treated outpt f/u suspect will need CPAP/BiPAP   PAD CAS History CEA in 2019 home apixaban , statin   Debility PT following Will order [facility] PT/OT + DME     Class 3 obesity based on BMI: Body mass index is 42.74 kg/m.SABRA Significantly low or high BMI is associated with higher medical risk.  Underweight - under 18  overweight - 25 to 29 obese - 30 or more Class 1 obesity: BMI of 30.0 to 34  Class 2 obesity: BMI of 35.0 to 39 Class 3 obesity: BMI of 40.0 to 49 Super Morbid Obesity: BMI 50-59 Super-super Morbid Obesity: BMI 60+ Healthy nutrition and physical activity advised as adjunct to other disease management and risk reduction treatments

## 2024-05-07 NOTE — Inpatient Diabetes Management (Signed)
 Inpatient Diabetes Program Recommendations  AACE/ADA: New Consensus Statement on Inpatient Glycemic Control   Target Ranges:  Prepandial:   less than 140 mg/dL      Peak postprandial:   less than 180 mg/dL (1-2 hours)      Critically ill patients:  140 - 180 mg/dL    Latest Reference Range & Units 05/06/24 08:05 05/06/24 11:43 05/06/24 16:22 05/06/24 20:16 05/06/24 23:57 05/07/24 03:44 05/07/24 08:13  Glucose-Capillary 70 - 99 mg/dL 732 (H) 696 (H) 786 (H) 285 (H) 259 (H) 226 (H) 209 (H)   Review of Glycemic Control  Diabetes history: DM2 Outpatient Diabetes medications: Toujeo  65 units daily, Novolog  20 units TID (plus up to 40 more units for correction based on scale), Tradjenta 5 mg QAM, Metformin 1000 mg BID Current orders for Inpatient glycemic control: Semglee  50 units Q24H, Novolog  0-20 units Q4H, Farxiga  10 mg daily   Inpatient Diabetes Program Recommendations:     Insulin : If appropriate for patient and if eating well, please consider ordering Novolog  6 units TID with meals for meal coverage if patient eats at least 50% of meals.  NOTE: Signing off consult. Please reconsult inpatient diabetes coordinator if needed.  Thanks,  Earnie Gainer, RN, MSN, [facility] Diabetes Coordinator Inpatient Diabetes Program [phone] (Team Pager from 8am to 5pm)

## 2024-05-07 NOTE — Plan of Care (Signed)
   Problem: Fluid Volume: Goal: Ability to maintain a balanced intake and output will improve Outcome: Progressing   Problem: Health Behavior/Discharge Planning: Goal: Ability to identify and utilize available resources and services will improve Outcome: Progressing Goal: Ability to manage health-related needs will improve Outcome: Progressing

## 2024-05-07 NOTE — TOC CM/SW Note (Signed)
 Patient is not able to walk the distance required to go the bathroom, or he/she is unable to safely negotiate stairs required to access the bathroom.  A 3in1 BSC will alleviate this problem

## 2024-05-07 NOTE — Progress Notes (Signed)
 PROGRESS NOTE    Zachary Deleon   FMW:[id number] DOB: 1965-07-14  DOA: 05/03/2024 Date of Service: 05/07/24 which is hospital day 3  PCP: [name], NP    Hospital course / significant events:   Zachary Deleon is a 58 y.o. male with COPD on 2 L oxygen at baseline with CHF, extensive tobacco abuse history, CAD, diabetes, carotid stenosis, hypertension, morbid obesity with a BMI of 45, who presents via EMS on 11/22 for respiratory distress.   HPI: progressive dyspnea over the last 3 weeks as well as a cough. It acutely worsened over the 24 hours prior to arrival. On EMS arrival he was 78% on home 2 L and was immediately placed on CPAP.   11/22: to ED. Elevated lactic acid, troponin and BNP. CXR concerning for pulmonary edema. He received IV Lasix .  11/23: admitted to hospitalist. Echo pending  11/24: 3L UOP yesterday, continue diuresis.  11/25: Pt into Afib RVR started on amio bolus/drip. Cardiology consult - repeat Echo, may consider cardioversion if not improving. Still needing BiPAP  11/26: sinus rhythm. Continuing diuresis. Off BiPAP today and onto [place]      Consultants:  Cardiology  PCCU    Procedures/Surgeries: none      ASSESSMENT & PLAN:    # HFmid-rangeEF with exacerbation Normal EF in 2019, here 40-45, mild RV dysfunction.  IV lasix  60 mg bid  Farxiga  10 mg dialy Spiro 25 mg daily  Other GDMT as able  Atorvastatin  80 mg daily  hold coreg  Cardiology following    # Acute on chronic hypoxic respiratory failure Required bipap initially, weaned to 3 liters. Affirms with wife and family present he does not want intubation, is aware we would pursue comfort if fails. Treating CHF  steroids, lasix , bipap   # A-fib with rvr May be contributing to above decompensated chf Amio infusion Eliquis    # AKI vs progression to ckd Cr 1.5s, was around 1 last year monitor while diuresing   # Anemia Borderline microcytic. Denies bleeding. Labs consistent w/  iron deficiency. Hgb stable here monitor for now   # Thrombocytopenia Chronic low 100s. Hiv neg. Smear unremarkable f/u  hcv  monitor   # Staph hominis (+)BCx likely contaminant  In one set of blood cultures, likely contaminant   # COPD Wheezing today, may be mediated by chf but prudent to treat methylpred   # T2DM Poor control A1c 10.6. 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  Underweight - under 18  overweight - 25 to 29 obese - 30 or more Class 1 obesity: BMI of 30.0 to 34 Class 2 obesity: BMI of 35.0 to 39 Class 3 obesity: BMI of 40.0 to 49 Super Morbid Obesity: BMI 50-59 Super-super Morbid Obesity: BMI 60+ Healthy nutrition and physical activity advised as adjunct to other disease management and risk reduction treatments  DVT prophylaxis: eliquis  IV fluids: no continuous IV fluids  Nutrition: carb modified Central lines / other devices: none  Code Status: DNR  ACP documentation reviewed: none on file in [facility]  TOC needs: TBD Medical barriers to dispo: diuresing. Expected medical readiness for discharge few days.              Subjective / Brief ROS:  Patient reports breathing is better  Denies CP/SOB.  Pain controlled.  Denies new weakness.  Tolerating diet.  Reports no concerns w/ urination/defecation.   Family Communication: family at bedside on rounds      Objective Findings:  Vitals:   05/07/24 0424 05/07/24 0813 05/07/24 1124 05/07/24 1546  BP:  114/83 134/80 137/78  Pulse:  91 89 86  Resp:  (!) 22 20 (!) 22  Temp:  (!) 97.5 F (36.4 C) (!) 97.5 F (36.4 C) 97.9 F (36.6 C)  TempSrc:  Oral Oral   SpO2:  97% 94% 98%  Weight: (!) 151 kg     Height:        Intake/Output Summary (Last 24 hours) at 05/07/2024 1700 Last data filed at 05/07/2024 1642 Gross per 24 hour  Intake 824.59 ml  Output 3900 ml  Net -3075.41 ml   Filed Weights   05/05/24 0347 05/06/24 0500 05/07/24 0424  Weight: (!) 158.7 kg (!) 154.6 kg (!) 151 kg    Examination:  Physical Exam Constitutional:      General: He is not in acute distress. Cardiovascular:     Rate and Rhythm: Normal rate and regular rhythm.  Pulmonary:     Effort: Pulmonary effort is normal.     Breath sounds: Normal breath sounds.  Abdominal:     General: Bowel sounds are normal.     Palpations: Abdomen is soft.  Musculoskeletal:     Right lower leg: Edema present.     Left lower leg: Edema present.  Skin:    General: Skin is warm and dry.  Neurological:     Mental Status: He is alert and oriented to person, place, and time.  Psychiatric:        Mood and Affect: Mood normal.        Behavior: Behavior normal.          Scheduled Medications:   apixaban   5 mg Oral BID   atorvastatin   80 mg Oral Daily   cyclobenzaprine   10 mg Oral BID   dapagliflozin  propanediol  10 mg Oral Daily   famotidine   20 mg Oral BID   fenofibrate   160 mg Oral q morning   FLUoxetine   40 mg Oral q AM   fluticasone  furoate-vilanterol  1 puff Inhalation Daily   furosemide   60 mg Intravenous BID   guaiFENesin   600 mg Oral BID   [START ON 05/08/2024] insulin  aspart  0-20 Units Subcutaneous TID WC   insulin  aspart  0-5 Units Subcutaneous QHS   [START ON 05/08/2024] insulin  aspart  6 Units Subcutaneous TID WC   insulin  glargine-yfgn  50 Units Subcutaneous Q24H   ipratropium-albuterol   3 mL  Nebulization TID   pantoprazole   40 mg Oral q AM   pregabalin   75 mg Oral TID   sacubitril -valsartan   1 tablet Oral BID   spironolactone   25 mg Oral Daily   umeclidinium bromide   1 puff Inhalation Daily    Continuous Infusions:  amiodarone  30 mg/hr (05/07/24 1130)    PRN Medications:  acetaminophen  **OR** acetaminophen , albuterol , guaiFENesin -dextromethorphan, ondansetron  **OR** ondansetron  (ZOFRAN ) IV, zolpidem   Antimicrobials from admission:  Anti-infectives (From admission, onward)    Start     Dose/Rate Route Frequency Ordered Stop   05/04/24 0245  doxycycline  (VIBRA -TABS) tablet 100 mg  Status:  Discontinued        100 mg Oral Every 12 hours 05/04/24 0231 05/05/24 0938   05/04/24 0015  cefTRIAXone  (ROCEPHIN ) 1 g in sodium chloride  0.9 % 100 mL IVPB        1 g 200 mL/hr over 30 Minutes Intravenous  Once 05/04/24 0001 05/04/24 0104   05/04/24 0015  azithromycin  (ZITHROMAX ) 500 mg in sodium chloride  0.9 % 250 mL IVPB        500 mg 250 mL/hr over 60 Minutes Intravenous  Once 05/04/24 0001 05/04/24 0234           Data Reviewed:  I have personally reviewed the following...  CBC: Recent Labs  Lab 05/03/24 2257 05/04/24 0535 05/05/24 0322 05/05/24 1137  WBC 12.1* 8.3 11.4* 15.6*  NEUTROABS 8.7*  --   --  13.2*  HGB 9.0* 8.5* 9.0* 9.0*  HCT 29.3* 27.2* 28.2* 29.4*  MCV 81.6 80.5 80.3 81.7  PLT 134* 98* 175 240   Basic Metabolic Panel: Recent Labs  Lab 05/03/24 2257 05/04/24 0535 05/05/24 0322 05/06/24 0349 05/07/24 0514  NA 131* 130* 133* 139 139  K 4.1 4.6 4.8 4.4 4.2  CL 95* 95* 96* 98 98  CO2 20* 20* 23 28 28   GLUCOSE 330* 447* 401* 191* 219*  BUN 19 25* 36* 36* 38*  CREATININE 1.47* 1.59* 1.59* 1.49* 1.37*  CALCIUM  8.2* 7.9* 8.4* 8.7* 8.6*  MG  --   --   --  2.1 2.5*   GFR: Estimated Creatinine Clearance: 90.1 mL/min (A) (by C-G formula based on SCr of 1.37 mg/dL (H)). Liver Function Tests: Recent Labs  Lab 05/03/24 2257 05/05/24 0322  AST  51* 55*  ALT 41 42  ALKPHOS 66 68  BILITOT 0.6 0.6  PROT 7.4 7.8  ALBUMIN 4.2 4.5   Recent Labs  Lab 05/03/24 2257  LIPASE 22   No results for input(s): AMMONIA in the last 168 hours. Coagulation Profile: No results for input(s): INR, PROTIME in the last 168 hours. Cardiac Enzymes: No results for input(s): CKTOTAL, CKMB, CKMBINDEX, TROPONINI in the last 168 hours. BNP (last 3 results) Recent Labs    05/03/24 2257  PROBNP 1,811.0*   HbA1C: No results for input(s): HGBA1C in the last 72 hours. CBG: Recent Labs  Lab 05/06/24 2357 05/07/24 0344 05/07/24 0813 05/07/24 1126 05/07/24 1547  GLUCAP 259* 226* 209* 259* 263*   Lipid Profile: No results for input(s): CHOL, HDL, LDLCALC, TRIG, CHOLHDL, LDLDIRECT in the last 72 hours. Thyroid Function Tests: No results for input(s): TSH, T4TOTAL, FREET4, T3FREE, THYROIDAB in the last 72 hours. Anemia Panel: Recent Labs    05/05/24 1137  FERRITIN 52  TIBC 515*  IRON 40*   Most Recent Urinalysis On File:     Component Value Date/Time   COLORURINE YELLOW (A) 05/03/2024 2257   APPEARANCEUR HAZY (A) 05/03/2024 2257   LABSPEC 1.016 05/03/2024 2257   PHURINE 5.0 05/03/2024 2257   GLUCOSEU >=500 (A) 05/03/2024 2257   HGBUR NEGATIVE 05/03/2024 2257   BILIRUBINUR NEGATIVE 05/03/2024 2257   KETONESUR NEGATIVE 05/03/2024 2257   PROTEINUR 100 (A) 05/03/2024 2257   NITRITE NEGATIVE 05/03/2024 2257   LEUKOCYTESUR NEGATIVE 05/03/2024 2257   Sepsis Labs: @LABRCNTIP (procalcitonin:4,lacticidven:4) Microbiology: Recent Results (from the past 240 hours)  Resp panel by RT-PCR (  RSV, Flu A&B, Covid) Anterior Nasal Swab     Status: None   Collection Time: 05/03/24 10:57 PM   Specimen: Anterior Nasal Swab  Result Value Ref Range Status   SARS Coronavirus 2 by RT PCR NEGATIVE NEGATIVE Final    Comment: (NOTE) SARS-CoV-2 target nucleic acids are NOT DETECTED.  The SARS-CoV-2 RNA is generally  detectable in upper respiratory specimens during the acute phase of infection. The lowest concentration of SARS-CoV-2 viral copies this assay can detect is 138 copies/mL. A negative result does not preclude SARS-Cov-2 infection and should not be used as the sole basis for treatment or other patient management decisions. A negative result may occur with  improper specimen collection/handling, submission of specimen other than nasopharyngeal swab, presence of viral mutation(s) within the areas targeted by this assay, and inadequate number of viral copies(<138 copies/mL). A negative result must be combined with clinical observations, patient history, and epidemiological information. The expected result is Negative.  Fact Sheet for Patients:  bloggercourse.com  Fact Sheet for Healthcare Providers:  seriousbroker.it  This test is no t yet approved or cleared by the [place]  FDA and  has been authorized for detection and/or diagnosis of SARS-CoV-2 by FDA under an Emergency Use Authorization (EUA). This EUA will remain  in effect (meaning this test can be used) for the duration of the COVID-19 declaration under Section 564(b)(1) of the Act, 21 U.S.C.section 360bbb-3(b)(1), unless the authorization is terminated  or revoked sooner.       Influenza A by PCR NEGATIVE NEGATIVE Final   Influenza B by PCR NEGATIVE NEGATIVE Final    Comment: (NOTE) The Xpert Xpress SARS-CoV-2/FLU/RSV plus assay is intended as an aid in the diagnosis of influenza from Nasopharyngeal swab specimens and should not be used as a sole basis for treatment. Nasal washings and aspirates are unacceptable for Xpert Xpress SARS-CoV-2/FLU/RSV testing.  Fact Sheet for Patients: bloggercourse.com  Fact Sheet for Healthcare Providers: seriousbroker.it  This test is not yet approved or cleared by the [place]  FDA  and has been authorized for detection and/or diagnosis of SARS-CoV-2 by FDA under an Emergency Use Authorization (EUA). This EUA will remain in effect (meaning this test can be used) for the duration of the COVID-19 declaration under Section 564(b)(1) of the Act, 21 U.S.C. section 360bbb-3(b)(1), unless the authorization is terminated or revoked.     Resp Syncytial Virus by PCR NEGATIVE NEGATIVE Final    Comment: (NOTE) Fact Sheet for Patients: bloggercourse.com  Fact Sheet for Healthcare Providers: seriousbroker.it  This test is not yet approved or cleared by the [place]  FDA and has been authorized for detection and/or diagnosis of SARS-CoV-2 by FDA under an Emergency Use Authorization (EUA). This EUA will remain in effect (meaning this test can be used) for the duration of the COVID-19 declaration under Section 564(b)(1) of the Act, 21 U.S.C. section 360bbb-3(b)(1), unless the authorization is terminated or revoked.  Performed at [facility], [address] [place], [place] [address]   Blood culture (routine x 2)     Status: Abnormal   Collection Time: 05/03/24 10:58 PM   Specimen: BLOOD  Result Value Ref Range Status   Specimen Description   Final    BLOOD RIGHT ANTECUBITAL Performed at [facility], [address] [place], [place] [address]    Special Requests   Final    BOTTLES DRAWN AEROBIC AND ANAEROBIC Blood Culture results may not be optimal due to an inadequate volume of blood received in  culture bottles Performed at [facility], [address] [place], [place] [address]    Culture  Setup Time   Final    Organism ID to follow GRAM POSITIVE COCCI IN BOTH AEROBIC AND ANAEROBIC BOTTLES CRITICAL RESULT CALLED TO, READ BACK BY AND VERIFIED WITH: LUM HILARIO GOWER D AT 1546 05/04/24 RAM Performed at [facility], [address] [place], [place] [address]    Culture  (A)  Final    STAPHYLOCOCCUS HOMINIS THE SIGNIFICANCE OF ISOLATING THIS ORGANISM FROM A SINGLE SET OF BLOOD CULTURES WHEN MULTIPLE SETS ARE DRAWN IS UNCERTAIN. PLEASE NOTIFY THE MICROBIOLOGY DEPARTMENT WITHIN ONE WEEK IF SPECIATION AND SENSITIVITIES ARE REQUIRED. Performed at [facility], [address], [place], [place] [address]    Report Status 05/07/2024 FINAL  Final  Blood Culture ID Panel (Reflexed)     Status: Abnormal   Collection Time: 05/03/24 10:58 PM  Result Value Ref Range Status   Enterococcus faecalis NOT DETECTED NOT DETECTED Final   Enterococcus Faecium NOT DETECTED NOT DETECTED Final   Listeria monocytogenes NOT DETECTED NOT DETECTED Final   Staphylococcus species DETECTED (A) NOT DETECTED Final    Comment: CRITICAL RESULT CALLED TO, READ BACK BY AND VERIFIED WITH: MADISON H., PHARM D AT 1546 05/04/24 RAM    Staphylococcus aureus (BCID) NOT DETECTED NOT DETECTED Final   Staphylococcus epidermidis NOT DETECTED NOT DETECTED Final   Staphylococcus lugdunensis NOT DETECTED NOT DETECTED Final   Streptococcus species NOT DETECTED NOT DETECTED Final   Streptococcus agalactiae NOT DETECTED NOT DETECTED Final   Streptococcus pneumoniae NOT DETECTED NOT DETECTED Final   Streptococcus pyogenes NOT DETECTED NOT DETECTED Final   A.calcoaceticus-baumannii NOT DETECTED NOT DETECTED Final   Bacteroides fragilis NOT DETECTED NOT DETECTED Final   Enterobacterales NOT DETECTED NOT DETECTED Final   Enterobacter cloacae complex NOT DETECTED NOT DETECTED Final   Escherichia coli NOT DETECTED NOT DETECTED Final   Klebsiella aerogenes NOT DETECTED NOT DETECTED Final   Klebsiella oxytoca NOT DETECTED NOT DETECTED Final   Klebsiella pneumoniae NOT DETECTED NOT DETECTED Final   Proteus species NOT DETECTED NOT DETECTED Final   Salmonella species NOT DETECTED NOT DETECTED Final   Serratia marcescens NOT DETECTED NOT DETECTED Final   Haemophilus influenzae NOT DETECTED NOT DETECTED  Final   Neisseria meningitidis NOT DETECTED NOT DETECTED Final   Pseudomonas aeruginosa NOT DETECTED NOT DETECTED Final   Stenotrophomonas maltophilia NOT DETECTED NOT DETECTED Final   Candida albicans NOT DETECTED NOT DETECTED Final   Candida auris NOT DETECTED NOT DETECTED Final   Candida glabrata NOT DETECTED NOT DETECTED Final   Candida krusei NOT DETECTED NOT DETECTED Final   Candida parapsilosis NOT DETECTED NOT DETECTED Final   Candida tropicalis NOT DETECTED NOT DETECTED Final   Cryptococcus neoformans/gattii NOT DETECTED NOT DETECTED Final    Comment: Performed at [facility], [address] [place], [place] [address]  Blood culture (routine x 2)     Status: None (Preliminary result)   Collection Time: 05/03/24 11:03 PM   Specimen: BLOOD  Result Value Ref Range Status   Specimen Description BLOOD BLOOD RIGHT FOREARM  Final   Special Requests   Final    BOTTLES DRAWN AEROBIC AND ANAEROBIC Blood Culture results may not be optimal due to an inadequate volume of blood received in culture bottles   Culture   Final    NO GROWTH 4 DAYS Performed at [facility], [address], [place], [place] [address]  Report Status PENDING  Incomplete  Respiratory (~20 pathogens) panel by PCR     Status: None   Collection Time: 05/04/24  9:57 AM   Specimen: Nasopharyngeal Swab; Respiratory  Result Value Ref Range Status   Adenovirus NOT DETECTED NOT DETECTED Final   Coronavirus 229E NOT DETECTED NOT DETECTED Final    Comment: (NOTE) The Coronavirus on the Respiratory Panel, DOES NOT test for the novel  Coronavirus (2019 nCoV)    Coronavirus HKU1 NOT DETECTED NOT DETECTED Final   Coronavirus NL63 NOT DETECTED NOT DETECTED Final   Coronavirus OC43 NOT DETECTED NOT DETECTED Final   Metapneumovirus NOT DETECTED NOT DETECTED Final   Rhinovirus / Enterovirus NOT DETECTED NOT DETECTED Final   Influenza A NOT DETECTED NOT DETECTED Final   Influenza B NOT DETECTED NOT  DETECTED Final   Parainfluenza Virus 1 NOT DETECTED NOT DETECTED Final   Parainfluenza Virus 2 NOT DETECTED NOT DETECTED Final   Parainfluenza Virus 3 NOT DETECTED NOT DETECTED Final   Parainfluenza Virus 4 NOT DETECTED NOT DETECTED Final   Respiratory Syncytial Virus NOT DETECTED NOT DETECTED Final   Bordetella pertussis NOT DETECTED NOT DETECTED Final   Bordetella Parapertussis NOT DETECTED NOT DETECTED Final   Chlamydophila pneumoniae NOT DETECTED NOT DETECTED Final   Mycoplasma pneumoniae NOT DETECTED NOT DETECTED Final    Comment: Performed at [facility], [address], [place], [place] [address]      Radiology Studies last 3 days: [facility] Chest [facility] 1 View Result Date: 05/06/2024 CLINICAL DATA:  Dyspnea. EXAM: PORTABLE CHEST 1 VIEW COMPARISON:  Radiographs 05/03/2024.  No other comparison studies. FINDINGS: 1304 hours. No significant change in cardiomegaly and asymmetric elevation of the right hemidiaphragm. Mild increase in generalized interstitial prominence which may reflect worsening edema. There is no confluent airspace disease, pneumothorax or significant pleural effusion. Mild degenerative changes in the spine without evidence of acute osseous abnormality. IMPRESSION: Cardiomegaly with possible increased mild interstitial edema. No confluent airspace disease or significant pleural effusion. Electronically Signed   By: Elsie Perone M.D.   On: 05/06/2024 14:43   ECHOCARDIOGRAM COMPLETE Result Date: 05/04/2024    ECHOCARDIOGRAM REPORT   Patient Name:    Zachary Deleon Date of Exam: 05/04/2024 Medical Rec #:  [id number]    Height:       74.0 in Accession #:    [id number]   Weight:       352.0 lb Date of Birth:  06-29-1965    BSA:          2.764 m Patient Age:    58 years     BP:           148/85 mmHg Patient Gender: M            HR:           109 bpm. Exam Location:  [facility] Procedure: 2D Echo, Cardiac Doppler, Color Doppler and Intracardiac            Opacification Agent (Both Spectral  and Color Flow Doppler were            utilized during procedure). STAT ECHO Indications:     CHF I50.31  History:         Patient has no prior history of Echocardiogram examinations.  Sonographer:     Thedora Louder RDCS, FASE Referring Phys:  [id number] LORANE POLAND Diagnosing Phys: Denyse Bathe  Sonographer Comments: Technically challenging study due to limited acoustic windows, suboptimal apical window, no subcostal window  and patient is obese. Image acquisition challenging due to patient body habitus and Image acquisition challenging due to respiratory motion. IMPRESSIONS  1. Left ventricular ejection fraction, by estimation, is 40 to 45%. The left ventricle has mildly decreased function. The left ventricle demonstrates global hypokinesis. The left ventricular internal cavity size was mildly to moderately dilated. There is moderate concentric left ventricular hypertrophy. Left ventricular diastolic parameters were normal.  2. Right ventricular systolic function is low normal. The right ventricular size is mildly enlarged. Mildly increased right ventricular wall thickness.  3. Left atrial size was mild to moderately dilated.  4. Right atrial size was mild to moderately dilated.  5. The mitral valve is grossly normal. Trivial mitral valve regurgitation.  6. The aortic valve is grossly normal. Aortic valve regurgitation is mild. FINDINGS  Left Ventricle: Left ventricular ejection fraction, by estimation, is 40 to 45%. The left ventricle has mildly decreased function. The left ventricle demonstrates global hypokinesis. Definity contrast agent was given IV to delineate the left ventricular  endocardial borders. Strain was performed and the global longitudinal strain is indeterminate. The left ventricular internal cavity size was mildly to moderately dilated. There is moderate concentric left ventricular hypertrophy. Left ventricular diastolic parameters were normal. Right Ventricle: The right ventricular size is  mildly enlarged. Mildly increased right ventricular wall thickness. Right ventricular systolic function is low normal. Left Atrium: Left atrial size was mild to moderately dilated. Right Atrium: Right atrial size was mild to moderately dilated. Pericardium: Trivial pericardial effusion is present. The pericardial effusion is circumferential. Mitral Valve: The mitral valve is grossly normal. Trivial mitral valve regurgitation. Tricuspid Valve: The tricuspid valve is grossly normal. Tricuspid valve regurgitation is trivial. Aortic Valve: The aortic valve is grossly normal. Aortic valve regurgitation is mild. Pulmonic Valve: The pulmonic valve was grossly normal. Pulmonic valve regurgitation is trivial. Aorta: The aortic root, ascending aorta and aortic arch are all structurally normal, with no evidence of dilitation or obstruction. IAS/Shunts: No atrial level shunt detected by color flow Doppler. Additional Comments: 3D was performed not requiring image post processing on an independent workstation and was indeterminate.  LEFT VENTRICLE PLAX 2D LVIDd:         6.90 cm      Diastology LVIDs:         5.50 cm      LV e' lateral:   10.90 cm/s LV PW:         1.00 cm      LV E/e' lateral: 8.3 LV IVS:        1.10 cm LVOT diam:     2.20 cm LVOT Area:     3.80 cm  LV Volumes (MOD) LV vol d, MOD A2C: 200.0 ml LV vol d, MOD A4C: 175.0 ml LV vol s, MOD A2C: 114.0 ml LV vol s, MOD A4C: 105.0 ml LV SV MOD A2C:     86.0 ml LV SV MOD A4C:     175.0 ml LV SV MOD BP:      74.5 ml LEFT ATRIUM           Index LA Vol (A4C): 59.0 ml 21.35 ml/m                        PULMONIC VALVE AORTA                 PV Vmax:        1.04 m/s Ao Root diam: 3.20 cm PV Peak  grad:   4.3 mmHg Ao Asc diam:  2.90 cm RVOT Peak grad: 3 mmHg  MITRAL VALVE MV Area (PHT): 4.19 cm    SHUNTS MV Decel Time: 181 msec    Systemic Diam: 2.20 cm MV E velocity: 90.70 cm/s MV A velocity: 78.60 cm/s MV E/A ratio:  1.15 Shaukat Khan Electronically signed by Denyse Bathe  Signature Date/Time: 05/04/2024/1:21:33 PM    Final    DG Chest Portable 1 View Result Date: 05/03/2024 EXAM: 1 VIEW(S) XRAY OF THE CHEST 05/03/2024 11:19:00 PM COMPARISON: None available. CLINICAL HISTORY: shortness of breath FINDINGS: LUNGS AND PLEURA: Elevated right hemidiaphragm and low lung volumes. Mild pulmonary edema. No pleural effusion. No pneumothorax. HEART AND MEDIASTINUM: Cardiomegaly. BONES AND SOFT TISSUES: No acute osseous abnormality. IMPRESSION: 1. Mild pulmonary edema. 2. Cardiomegaly. Electronically signed by: Oneil Devonshire MD 05/03/2024 11:26 PM EST RP Workstation: [email or web address] Laneta Blunt, DO [facility] 05/07/2024, 5:00 PM    Dictation software may have been used to generate the above note. Typos may occur and escape review in typed/dictated notes. Please contact Dr Blunt directly for clarity if needed.  Staff may message me via secure chat in Epic  but this may not receive an immediate response,  please page me for urgent matters!  If 7PM-7AM, please contact night coverage [email or web address]

## 2024-05-07 NOTE — Progress Notes (Signed)
 Heart Failure Stewardship Pharmacy Note  PCP: [name], NP PCP-Cardiologist: None  HPI: Zachary Deleon is a 58 y.o. male with COPD on chronic 2L O2, tobacco use, CAD, T2DM, carotid stenosis, HTN, OSA, seizure disorder who presented with progressive dyspnea and cough over 3 weeks. On admission, proBNP was 1811, HS-troponin was 180, lactic acid was 3.9, and A1c was 10.6. Chest x-ray noted mild pulmonary edema. TTE 05/04/24 showed LVEF of 40-45%, mild to moderate LV dilation, global hypokinesis, low normal RV function.   Pertinent Lab Values: Creatinine, Ser  Date Value Ref Range Status  05/07/2024 1.37 (H) 0.61 - 1.24 mg/dL Final   BUN  Date Value Ref Range Status  05/07/2024 38 (H) 6 - 20 mg/dL Final   Potassium  Date Value Ref Range Status  05/07/2024 4.2 3.5 - 5.1 mmol/L Final   Sodium  Date Value Ref Range Status  05/07/2024 139 135 - 145 mmol/L Final   Magnesium  Date Value Ref Range Status  05/07/2024 2.5 (H) 1.7 - 2.4 mg/dL Final    Comment:    Performed at [facility], [address] [place], [place] [address]   Hgb A1c MFr Bld  Date Value Ref Range Status  05/03/2024 10.6 (H) 4.8 - 5.6 % Final    Comment:    (NOTE) Diagnosis of Diabetes The following HbA1c ranges recommended by the American Diabetes Association (ADA) may be used as an aid in the diagnosis of diabetes mellitus.  Hemoglobin             Suggested A1C NGSP%              Diagnosis  <5.7                   Non Diabetic  5.7-6.4                Pre-Diabetic  >6.4                   Diabetic  <7.0                   Glycemic control for                       adults with diabetes.      Vital Signs: Temp:  [97.2 F (36.2 C)-98.9 F (37.2 C)] 97.5 F (36.4 C) (11/26 0813) Pulse Rate:  [50-96] 91 (11/26 0813) Cardiac Rhythm: Normal sinus rhythm (11/25 2122) Resp:  [19-23] 22 (11/26 0813) BP: (111-178)/(62-107) 114/83 (11/26 0813) SpO2:  [91 %-98 %] 97 % (11/26  0813) FiO2 (%):  [30 %] 30 % (11/25 2043) Weight:  [151 kg (332 lb 14.3 oz)] 151 kg (332 lb 14.3 oz) (11/26 0424)  Intake/Output Summary (Last 24 hours) at 05/07/2024 9166 Last data filed at 05/07/2024 0448 Gross per 24 hour  Intake 344.59 ml  Output 3600 ml  Net -3255.41 ml    Current Heart Failure Medications:  Loop diuretic: furosemide  60 mg IV BID Beta-Blocker: none ACEI/ARB/ARNI: none MRA: spironolactone  25 mg daily SGLT2i: Farxiga  10 mg daily Other: none  Prior to admission Heart Failure Medications:  No PTA CHF medications  Assessment: 1. 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 NYHA class IV symptoms.  -Symptoms: Patient is no longer short of breath at rest. Patient has multiple potential causes of respiratory distress, all of which are likely contributing.  LEE reportedly improving. -Volume: Appears to still be hypervolemic, though hard to assess due to body habitus. Good urine output yesterday. Creatinine is improving slowly with diuresis. Furosemide  recently increased to 60 mg IV BID. Would continue current dose today. -Hemodynamics: BP is elevated. EKG In ED reported as sinus tachycardia. -BB: Carvedilol  has been stopped due to volume overload, lactic acidosis, and COPD. Would not restart any beta blocker until euvolemic and stable. When beta blocker is restarted, would consider bisoprolol. -ACEI/ARB/ARNI: Consider starting Entresto  24-26 mg BID. In addition to improving CHF, it will augment diuresis. -MRA: Continue spironolactone  25 mg daily -SGLT2i: Continue Farxiga  10 mg daily  Plan: 1) Medication changes recommended at this time: -Consider starting Entresto  24-26 mg BID  2) Patient assistance: -Copay for Entresto , Farxiga , and Jardiance are $0  3) Education: - Patient has been educated on current HF medications and potential additions to HF medication regimen - Patient  verbalizes understanding that over the next few months, these medication doses may change and more medications may be added to optimize HF regimen - Patient has been educated on basic disease state pathophysiology and goals of therapy  Medication Assistance / Insurance Benefits Check: Does the patient have prescription insurance?    Please do not hesitate to reach out with questions or concerns,  Zachary Deleon, PharmD, CPP, BCPS, [facility] Heart Failure Pharmacist  Phone - [phone] 05/07/2024 8:33 AM

## 2024-05-07 NOTE — TOC Progression Note (Addendum)
 Transition of Care [facility]) - Progression Note    Patient Details  Name: Zachary Deleon MRN: [id number] Date of Birth: 05-Dec-1965  Transition of Care [facility]) CM/SW Contact  Shasta DELENA Daring, RN Phone Number: 05/07/2024, 10:41 AM  Clinical Narrative:     Referral sent to [facility] companies via hub. Will follow for service offers.   4:39 PM Patient selected [facility] for Home health. Liaison is aware.                  Expected Discharge Plan and Services                                               Social Drivers of Health (SDOH) Interventions SDOH Screenings   Food Insecurity: No Food Insecurity (05/04/2024)  Housing: Low Risk  (05/04/2024)  Transportation Needs: No Transportation Needs (05/04/2024)  Utilities: Not At Risk (05/04/2024)  Financial Resource Strain: Low Risk  (05/06/2024)  Social Connections: Moderately Integrated (05/04/2024)  Tobacco Use: Medium Risk (05/03/2024)    Readmission Risk Interventions     No data to display

## 2024-05-08 DIAGNOSIS — I5023 Acute on chronic systolic (congestive) heart failure: Secondary | ICD-10-CM | POA: Diagnosis not present

## 2024-05-08 LAB — BASIC METABOLIC PANEL WITH GFR
Anion gap: 12 (ref 5–15)
BUN: 38 mg/dL — ABNORMAL HIGH (ref 6–20)
CO2: 30 mmol/L (ref 22–32)
Calcium: 8.8 mg/dL — ABNORMAL LOW (ref 8.9–10.3)
Chloride: 97 mmol/L — ABNORMAL LOW (ref 98–111)
Creatinine, Ser: 1.5 mg/dL — ABNORMAL HIGH (ref 0.61–1.24)
GFR, Estimated: 53 mL/min — ABNORMAL LOW (ref >60)
Glucose, Bld: 230 mg/dL — ABNORMAL HIGH (ref 70–99)
Potassium: 4.1 mmol/L (ref 3.5–5.1)
Sodium: 139 mmol/L (ref 135–145)

## 2024-05-08 LAB — CULTURE, BLOOD (ROUTINE X 2): Culture: NO GROWTH

## 2024-05-08 LAB — GLUCOSE, CAPILLARY
Glucose-Capillary: 214 mg/dL — ABNORMAL HIGH (ref 70–99)
Glucose-Capillary: 186 mg/dL — ABNORMAL HIGH (ref 70–99)
Glucose-Capillary: 153 mg/dL — ABNORMAL HIGH (ref 70–99)
Glucose-Capillary: 227 mg/dL — ABNORMAL HIGH (ref 70–99)
Glucose-Capillary: 252 mg/dL — ABNORMAL HIGH (ref 70–99)

## 2024-05-08 LAB — MAGNESIUM: Magnesium: 2.5 mg/dL — ABNORMAL HIGH (ref 1.7–2.4)

## 2024-05-08 MED ORDER — INSULIN ASPART 100 UNIT/ML IJ SOLN
10.0000 [IU] | Freq: Three times a day (TID) | INTRAMUSCULAR | Status: DC
  Administered 2024-05-08 (×3): 10 [IU] via SUBCUTANEOUS
  Filled 2024-05-08 (×3): qty 10

## 2024-05-08 MED ADMIN — Amiodarone HCl in Dextrose 4.14% IV Soln 360 MG/200ML: 30 mg/h | INTRAVENOUS | NDC 43066036020

## 2024-05-08 MED ADMIN — Apixaban Tab 5 MG: 5 mg | ORAL | NDC 00003089431

## 2024-05-08 MED ADMIN — Insulin Aspart Inj Soln 100 Unit/ML: 7 [IU] | SUBCUTANEOUS | NDC 73070010011

## 2024-05-08 MED ADMIN — Insulin Aspart Inj Soln 100 Unit/ML: 4 [IU] | SUBCUTANEOUS | NDC 73070010011

## 2024-05-08 MED ADMIN — Guaifenesin Tab ER 12HR 600 MG: 600 mg | ORAL | NDC 68084057211

## 2024-05-08 MED ADMIN — Cyclobenzaprine HCl Tab 10 MG: 10 mg | ORAL | NDC 60687055811

## 2024-05-08 MED ADMIN — Furosemide Inj 10 MG/ML: 60 mg | INTRAVENOUS | NDC 71288020302

## 2024-05-08 MED ADMIN — Pregabalin Cap 75 MG: 75 mg | ORAL | NDC 60687049511

## 2024-05-08 MED ADMIN — Spironolactone Tab 25 MG: 25 mg | ORAL | NDC 60687046511

## 2024-05-08 MED ADMIN — Umeclidinium Br Aero Powd Breath Act 62.5 MCG/ACT (Base Eq): 1 | RESPIRATORY_TRACT | NDC 00173087306

## 2024-05-08 MED ADMIN — Atorvastatin Calcium Tab 80 MG (Base Equivalent): 80 mg | ORAL | NDC 00904629304

## 2024-05-08 MED ADMIN — Insulin Aspart Inj Soln 100 Unit/ML: 3 [IU] | SUBCUTANEOUS | NDC 73070010011

## 2024-05-08 MED ADMIN — Fluticasone Furoate-Vilanterol Aero Powd BA 200-25 MCG/ACT: 1 | RESPIRATORY_TRACT | NDC 00173088214

## 2024-05-08 MED ADMIN — Fenofibrate Tab 160 MG: 160 mg | ORAL | NDC 42858066045

## 2024-05-08 MED ADMIN — Sacubitril-Valsartan Tab 24-26 MG: 1 | ORAL | NDC 72205028018

## 2024-05-08 MED ADMIN — Famotidine Tab 20 MG: 20 mg | ORAL | NDC 00904719361

## 2024-05-08 MED ADMIN — Pantoprazole Sodium EC Tab 40 MG (Base Equiv): 40 mg | ORAL | NDC 65862056099

## 2024-05-08 MED ADMIN — Fluoxetine HCl Cap 20 MG: 40 mg | ORAL | NDC 00904734661

## 2024-05-08 MED ADMIN — Amiodarone HCl Tab 200 MG: 400 mg | ORAL | NDC 00245014790

## 2024-05-08 MED ADMIN — Dapagliflozin Propanediol Tab 10 MG (Base Equivalent): 10 mg | ORAL | NDC 66993045730

## 2024-05-08 MED ADMIN — Acetaminophen Tab 325 MG: 650 mg | ORAL | NDC 00904677361

## 2024-05-08 MED ADMIN — Ipratropium-Albuterol Nebu Soln 0.5-2.5(3) MG/3ML: 3 mL | RESPIRATORY_TRACT | NDC 60687040579

## 2024-05-08 MED FILL — Ipratropium-Albuterol Nebu Soln 0.5-2.5(3) MG/3ML: 3.0000 mL | RESPIRATORY_TRACT | Qty: 3 | Status: AC

## 2024-05-08 MED FILL — Amiodarone HCl Tab 200 MG: 400.0000 mg | ORAL | Qty: 2 | Status: AC

## 2024-05-08 NOTE — Plan of Care (Signed)
  Problem: Education: Goal: Ability to describe self-care measures that may prevent or decrease complications (Diabetes Survival Skills Education) will improve Outcome: Progressing   Problem: Coping: Goal: Ability to adjust to condition or change in health will improve Outcome: Progressing   Problem: Fluid Volume: Goal: Ability to maintain a balanced intake and output will improve Outcome: Progressing   Problem: Health Behavior/Discharge Planning: Goal: Ability to identify and utilize available resources and services will improve Outcome: Progressing Goal: Ability to manage health-related needs will improve Outcome: Progressing   Problem: Metabolic: Goal: Ability to maintain appropriate glucose levels will improve Outcome: Progressing   Problem: Nutritional: Goal: Maintenance of adequate nutrition will improve Outcome: Progressing Goal: Progress toward achieving an optimal weight will improve Outcome: Progressing   Problem: Skin Integrity: Goal: Risk for impaired skin integrity will decrease Outcome: Progressing   Problem: Tissue Perfusion: Goal: Adequacy of tissue perfusion will improve Outcome: Progressing   Problem: Education: Goal: Knowledge of General Education information will improve Description: Including pain rating scale, medication(s)/side effects and non-pharmacologic comfort measures Outcome: Progressing   Problem: Health Behavior/Discharge Planning: Goal: Ability to manage health-related needs will improve Outcome: Progressing   Problem: Clinical Measurements: Goal: Ability to maintain clinical measurements within normal limits will improve Outcome: Progressing Goal: Will remain free from infection Outcome: Progressing Goal: Diagnostic test results will improve Outcome: Progressing Goal: Cardiovascular complication will be avoided Outcome: Progressing   Problem: Activity: Goal: Risk for activity intolerance will decrease Outcome: Progressing    Problem: Nutrition: Goal: Adequate nutrition will be maintained Outcome: Progressing   Problem: Coping: Goal: Level of anxiety will decrease Outcome: Progressing   Problem: Elimination: Goal: Will not experience complications related to bowel motility Outcome: Progressing Goal: Will not experience complications related to urinary retention Outcome: Progressing   Problem: Pain Managment: Goal: General experience of comfort will improve and/or be controlled Outcome: Progressing   Problem: Safety: Goal: Ability to remain free from injury will improve Outcome: Progressing   Problem: Skin Integrity: Goal: Risk for impaired skin integrity will decrease Outcome: Progressing   Problem: Education: Goal: Ability to demonstrate management of disease process will improve Outcome: Progressing Goal: Ability to verbalize understanding of medication therapies will improve Outcome: Progressing Goal: Individualized Educational Video(s) Outcome: Progressing   Problem: Activity: Goal: Capacity to carry out activities will improve Outcome: Progressing   Problem: Cardiac: Goal: Ability to achieve and maintain adequate cardiopulmonary perfusion will improve Outcome: Progressing   Problem: Education: Goal: Knowledge of disease or condition will improve Outcome: Progressing Goal: Knowledge of the prescribed therapeutic regimen will improve Outcome: Progressing Goal: Individualized Educational Video(s) Outcome: Progressing   Problem: Activity: Goal: Ability to tolerate increased activity will improve Outcome: Progressing Goal: Will verbalize the importance of balancing activity with adequate rest periods Outcome: Progressing   Problem: Respiratory: Goal: Ability to maintain a clear airway will improve Outcome: Progressing Goal: Levels of oxygenation will improve Outcome: Progressing Goal: Ability to maintain adequate ventilation will improve Outcome: Progressing   Problem:  Clinical Measurements: Goal: Respiratory complications will improve Outcome: Not Progressing

## 2024-05-08 NOTE — Progress Notes (Signed)
 [facility] CLINIC CARDIOLOGY PROGRESS NOTE       Patient ID: Zachary Deleon MRN: [id number] DOB/AGE: 11/27/1965 58 y.o.  Admit date: 05/03/2024 Referring Physician Dr. Devaughn Ban Primary Physician Baldwin, Grayce Livings, NP  Primary Cardiologist None Reason for Consultation AoCHF  HPI: Zachary Deleon is a 58 y.o. male  with a past medical history of  COPD on 2 L oxygen at baseline with CHF, extensive tobacco abuse history, CAD, diabetes, carotid stenosis, hypertension, morbid obesity who presented to the ED on 05/03/2024 for SOB, cough. Treated for CHF exacerbation, initially was improving but today with worsening status. Cardiology was consulted for further evaluation.   Interval history: Patient seen and examined at bedside, resting comfortably. He is feeling better this morning but kind of tired. His breathing and swelling have significantly improved.   Review of systems complete and found to be negative unless listed above    Past Medical History:  Diagnosis Date   CHF (congestive heart failure) (HCC)    COPD (chronic obstructive pulmonary disease) (HCC)    Diabetes mellitus without complication (HCC)    Stroke (HCC)      Medications Prior to Admission  Medication Sig Dispense Refill Last Dose/Taking   acetaminophen  (TYLENOL ) 325 MG tablet Take 650 mg by mouth every 6 (six) hours as needed.   Past Month   albuterol  (VENTOLIN  HFA) 108 (90 Base) MCG/ACT inhaler Inhale 2 puffs into the lungs every 6 (six) hours as needed for wheezing or shortness of breath.   Past Month   amLODipine  (NORVASC ) 10 MG tablet Take 10 mg by mouth at bedtime.   Past Week   apixaban  (ELIQUIS ) 5 MG TABS tablet Take 5 mg by mouth 2 (two) times daily.   Past Week   atorvastatin  (LIPITOR ) 80 MG tablet Take 80 mg by mouth daily.   Past Week   cyclobenzaprine  (FLEXERIL ) 10 MG tablet Take 10 mg by mouth 2 (two) times daily.   Past Week   famotidine  (PEPCID ) 20 MG tablet Take 20 mg by mouth 2 (two) times daily.    Past Week   fenofibrate  160 MG tablet Take 160 mg by mouth every morning.   Past Week   FLUoxetine  (PROZAC ) 40 MG capsule Take 40 mg by mouth in the morning.   Past Week   fluticasone -salmeterol (ADVAIR DISKUS) 250-50 MCG/ACT AEPB Inhale 1 puff into the lungs in the morning and at bedtime.   Past Week   insulin  aspart (NOVOLOG ) 100 UNIT/ML injection Inject 20 Units into the skin 3 (three) times daily before meals.   Past Week   insulin  glargine, 1 Unit Dial, (TOUJEO  SOLOSTAR) 300 UNIT/ML Solostar Pen Inject 65 Units into the skin daily.   Past Week   linagliptin (TRADJENTA) 5 MG TABS tablet Take 5 mg by mouth in the morning.   Past Week   loratadine (CLARITIN) 10 MG tablet Take 10 mg by mouth in the morning.   Past Week   losartan (COZAAR) 50 MG tablet Take 75 mg by mouth in the morning.   Past Week   metFORMIN (GLUCOPHAGE) 500 MG tablet Take 1,000 mg by mouth 2 (two) times daily with a meal.   Past Week   Multiple Vitamin (MULTIVITAMIN WITH MINERALS) TABS tablet Take 1 tablet by mouth in the morning.   Past Week   pantoprazole  (PROTONIX ) 40 MG tablet Take 40 mg by mouth in the morning.   Past Week   pregabalin  (LYRICA ) 75 MG capsule Take 75 mg by mouth 3 (three)  times daily.   Past Week   Tiotropium Bromide  (SPIRIVA HANDIHALER) 18 MCG CAPS Place 1 capsule into inhaler and inhale in the morning.   Past Week   sennosides-docusate sodium (SENOKOT-S) 8.6-50 MG tablet Take 1 tablet by mouth daily. (Patient not taking: Reported on 05/04/2024)   Not Taking   Social History   Socioeconomic History   Marital status: Married    Spouse name: Not on file   Number of children: Not on file   Years of education: Not on file   Highest education level: Not on file  Occupational History   Not on file  Tobacco Use   Smoking status: Former    Types: Cigarettes   Smokeless tobacco: Not on file  Substance and Sexual Activity   Alcohol use: Not Currently   Drug use: Not Currently   Sexual activity: Not on  file  Other Topics Concern   Not on file  Social History Narrative   Not on file   Social Drivers of Health   Financial Resource Strain: Low Risk  (05/06/2024)   Overall Financial Resource Strain (CARDIA)    Difficulty of Paying Living Expenses: Not hard at all  Food Insecurity: No Food Insecurity (05/04/2024)   Hunger Vital Sign    Worried About Running Out of Food in the Last Year: Never true    Ran Out of Food in the Last Year: Never true  Transportation Needs: No Transportation Needs (05/04/2024)   PRAPARE - Administrator, Civil Service (Medical): No    Lack of Transportation (Non-Medical): No  Physical Activity: Not on file  Stress: Not on file  Social Connections: Moderately Integrated (05/04/2024)   Social Connection and Isolation Panel    Frequency of Communication with Friends and Family: More than three times a week    Frequency of Social Gatherings with Friends and Family: Twice a week    Attends Religious Services: 1 to 4 times per year    Active Member of [employer] or Organizations: No    Attends Banker Meetings: Patient unable to answer    Marital Status: Married  Catering Manager Violence: Not At Risk (05/04/2024)   Humiliation, Afraid, Rape, and Kick questionnaire    Fear of Current or Ex-Partner: No    Emotionally Abused: No    Physically Abused: No    Sexually Abused: No    No family history on file.   Vitals:   05/08/24 0452 05/08/24 0654 05/08/24 0715 05/08/24 0744  BP: 124/79  117/80   Pulse: 80  84   Resp: 18  19   Temp: 98.3 F (36.8 C)  98 F (36.7 C)   TempSrc:      SpO2: 97%  95% 96%  Weight:  (!) 147.1 kg    Height:        PHYSICAL EXAM General: Ill appearing male, well nourished. Morbidly obese. HEENT: Normocephalic and atraumatic. Neck: No JVD.  Lungs: Increased WOB on BiPAP. Breath sounds diminished bilaterally. Heart: HRRR. Normal S1 and S2 without gallops or murmurs.  Abdomen: Non-distended appearing.   Msk: Normal strength and tone for age. Extremities: Warm and well perfused. No clubbing, cyanosis. 1+ pitting edema bilaterally.  Neuro: Alert and oriented X 3. Psych: Answers questions appropriately.   Labs: Basic Metabolic Panel: Recent Labs    05/07/24 0514 05/08/24 0419  NA 139 139  K 4.2 4.1  CL 98 97*  CO2 28 30  GLUCOSE 219* 230*  BUN 38* 38*  CREATININE 1.37* 1.50*  CALCIUM 8.6* 8.8*  MG 2.5* 2.5*   Liver Function Tests: No results for input(s): AST, ALT, ALKPHOS, BILITOT, PROT, ALBUMIN in the last 72 hours.  No results for input(s): LIPASE, AMYLASE in the last 72 hours.  CBC: Recent Labs    05/05/24 1137  WBC 15.6*  NEUTROABS 13.2*  HGB 9.0*  HCT 29.4*  MCV 81.7  PLT 240   Cardiac Enzymes: No results for input(s): CKTOTAL, CKMB, CKMBINDEX, TROPONINIHS in the last 72 hours. BNP: No results for input(s): BNP in the last 72 hours. D-Dimer: Recent Labs    05/06/24 1356  DDIMER 2.58*   Hemoglobin A1C: No results for input(s): HGBA1C in the last 72 hours.  Fasting Lipid Panel: No results for input(s): CHOL, HDL, LDLCALC, TRIG, CHOLHDL, LDLDIRECT in the last 72 hours. Thyroid Function Tests: No results for input(s): TSH, T4TOTAL, T3FREE, THYROIDAB in the last 72 hours.  Invalid input(s): FREET3 Anemia Panel: Recent Labs    05/05/24 1137  FERRITIN 52  TIBC 515*  IRON 40*     Radiology: DG Chest Port 1 View Result Date: 05/06/2024 CLINICAL DATA:  Dyspnea. EXAM: PORTABLE CHEST 1 VIEW COMPARISON:  Radiographs 05/03/2024.  No other comparison studies. FINDINGS: 1304 hours. No significant change in cardiomegaly and asymmetric elevation of the right hemidiaphragm. Mild increase in generalized interstitial prominence which may reflect worsening edema. There is no confluent airspace disease, pneumothorax or significant pleural effusion. Mild degenerative changes in the spine without evidence of acute  osseous abnormality. IMPRESSION: Cardiomegaly with possible increased mild interstitial edema. No confluent airspace disease or significant pleural effusion. Electronically Signed   By: Elsie Perone M.D.   On: 05/06/2024 14:43   ECHOCARDIOGRAM COMPLETE Result Date: 05/04/2024    ECHOCARDIOGRAM REPORT   Patient Name:    Besecker Date of Exam: 05/04/2024 Medical Rec #:  [id number]    Height:       74.0 in Accession #:    [id number]   Weight:       352.0 lb Date of Birth:  1965-06-22    BSA:          2.764 m Patient Age:    58 years     BP:           148/85 mmHg Patient Gender: M            HR:           109 bpm. Exam Location:  [facility] Procedure: 2D Echo, Cardiac Doppler, Color Doppler and Intracardiac            Opacification Agent (Both Spectral and Color Flow Doppler were            utilized during procedure). STAT ECHO Indications:     CHF I50.31  History:         Patient has no prior history of Echocardiogram examinations.  Sonographer:     Thedora Louder RDCS, FASE Referring Phys:  [id number] LORANE POLAND Diagnosing Phys: Denyse Bathe  Sonographer Comments: Technically challenging study due to limited acoustic windows, suboptimal apical window, no subcostal window and patient is obese. Image acquisition challenging due to patient body habitus and Image acquisition challenging due to respiratory motion. IMPRESSIONS  1. Left ventricular ejection fraction, by estimation, is 40 to 45%. The left ventricle has mildly decreased function. The left ventricle demonstrates global hypokinesis. The left ventricular internal cavity size was mildly to moderately dilated. There is moderate concentric left ventricular hypertrophy. Left ventricular diastolic parameters were  normal.  2. Right ventricular systolic function is low normal. The right ventricular size is mildly enlarged. Mildly increased right ventricular wall thickness.  3. Left atrial size was mild to moderately dilated.  4. Right atrial size was mild to  moderately dilated.  5. The mitral valve is grossly normal. Trivial mitral valve regurgitation.  6. The aortic valve is grossly normal. Aortic valve regurgitation is mild. FINDINGS  Left Ventricle: Left ventricular ejection fraction, by estimation, is 40 to 45%. The left ventricle has mildly decreased function. The left ventricle demonstrates global hypokinesis. Definity  contrast agent was given IV to delineate the left ventricular  endocardial borders. Strain was performed and the global longitudinal strain is indeterminate. The left ventricular internal cavity size was mildly to moderately dilated. There is moderate concentric left ventricular hypertrophy. Left ventricular diastolic parameters were normal. Right Ventricle: The right ventricular size is mildly enlarged. Mildly increased right ventricular wall thickness. Right ventricular systolic function is low normal. Left Atrium: Left atrial size was mild to moderately dilated. Right Atrium: Right atrial size was mild to moderately dilated. Pericardium: Trivial pericardial effusion is present. The pericardial effusion is circumferential. Mitral Valve: The mitral valve is grossly normal. Trivial mitral valve regurgitation. Tricuspid Valve: The tricuspid valve is grossly normal. Tricuspid valve regurgitation is trivial. Aortic Valve: The aortic valve is grossly normal. Aortic valve regurgitation is mild. Pulmonic Valve: The pulmonic valve was grossly normal. Pulmonic valve regurgitation is trivial. Aorta: The aortic root, ascending aorta and aortic arch are all structurally normal, with no evidence of dilitation or obstruction. IAS/Shunts: No atrial level shunt detected by color flow Doppler. Additional Comments: 3D was performed not requiring image post processing on an independent workstation and was indeterminate.  LEFT VENTRICLE PLAX 2D LVIDd:         6.90 cm      Diastology LVIDs:         5.50 cm      LV e' lateral:   10.90 cm/s LV PW:         1.00 cm      LV  E/e' lateral: 8.3 LV IVS:        1.10 cm LVOT diam:     2.20 cm LVOT Area:     3.80 cm  LV Volumes (MOD) LV vol d, MOD A2C: 200.0 ml LV vol d, MOD A4C: 175.0 ml LV vol s, MOD A2C: 114.0 ml LV vol s, MOD A4C: 105.0 ml LV SV MOD A2C:     86.0 ml LV SV MOD A4C:     175.0 ml LV SV MOD BP:      74.5 ml LEFT ATRIUM           Index LA Vol (A4C): 59.0 ml 21.35 ml/m                        PULMONIC VALVE AORTA                 PV Vmax:        1.04 m/s Ao Root diam: 3.20 cm PV Peak grad:   4.3 mmHg Ao Asc diam:  2.90 cm RVOT Peak grad: 3 mmHg  MITRAL VALVE MV Area (PHT): 4.19 cm    SHUNTS MV Decel Time: 181 msec    Systemic Diam: 2.20 cm MV E velocity: 90.70 cm/s MV A velocity: 78.60 cm/s MV E/A ratio:  1.15 Shaukat Khan Electronically signed by Denyse Bathe Signature Date/Time: 05/04/2024/1:21:33 PM  Final    DG Chest Portable 1 View Result Date: 05/03/2024 EXAM: 1 VIEW(S) XRAY OF THE CHEST 05/03/2024 11:19:00 PM COMPARISON: None available. CLINICAL HISTORY: shortness of breath FINDINGS: LUNGS AND PLEURA: Elevated right hemidiaphragm and low lung volumes. Mild pulmonary edema. No pleural effusion. No pneumothorax. HEART AND MEDIASTINUM: Cardiomegaly. BONES AND SOFT TISSUES: No acute osseous abnormality. IMPRESSION: 1. Mild pulmonary edema. 2. Cardiomegaly. Electronically signed by: Oneil Devonshire MD 05/03/2024 11:26 PM EST RP Workstation: [id number]    ECHO as above  TELEMETRY (personally reviewed): Sinus rhythm rate 80-90s  EKG (personally reviewed): sinus tach, PVC rate 101 bpm, repeat today with AF RVR rate 138 bpm  Data reviewed by me 05/08/2024: last 24h vitals tele labs imaging I/O ED provider note, admission H&P, hospitalist progress note, PCCM note, palliative note  Principal Problem:   CHF exacerbation (HCC) Active Problems:   Obesity, Class III, BMI 40-49.9 (morbid obesity) (HCC)   History of DVT (deep vein thrombosis)   History of pulmonary embolus (PE)   Seizure disorder (HCC)   History of  CVA (cerebrovascular accident)   T2DM (type 2 diabetes mellitus) (HCC)   COPD (chronic obstructive pulmonary disease) (HCC)   Essential hypertension   PAD (peripheral artery disease)   History of CEA (carotid endarterectomy)   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 male  with a past medical history of  COPD on 2 L oxygen at baseline with CHF, extensive tobacco abuse history, CAD, diabetes, carotid stenosis, hypertension, morbid obesity who presented to the ED on 05/03/2024 for SOB, cough. Treated for CHF exacerbation, initially was improving but today with worsening status. Cardiology was consulted for further evaluation.   # Acute on chronic HFmrEF # Atrial fibrillation RVR, new onset # Hypertension # COPD # Demand ischemia Patient initially presented for worsening shortness of breath and was treated with IV diuresis.  Respiratory status worsening today and requiring BiPAP.  Noted to have tachycardia and telemetry/EKG consistent with atrial fibrillation RVR.  Previously on anticoagulation for prior DVT/PE but no history of A-fib. - Continue IV amiodarone infusion for 1 hour after receiving first PO dose. He will take amio 400 mg BID for 10 days followed by amio 200 mg BID thereafter. - Continue Eliquis 5 mg twice daily for stroke risk reduction.  If unable to take p.o. meds due to requiring BiPAP then would switch to IV anticoagulation. - Continue IV Lasix 60 mg twice daily. Plan to switch to PO likely Saturday 11/29. - Continue Farxiga 10 mg daily, spironolactone 25 mg daily to help augment diuresis.  Further additions to GDMT as able.  Was on Coreg previously but this is held due to acute decompensated heart failure. - Continue atorvastatin 80 mg daily. - Mild and flat troponin most consistent with demand/supply mismatch and not ACS in the setting of AoCHF. - I did discuss CODE STATUS  with patient and family on 05/06/24.  He confirms that he is DNR/DNI - would not want CPR or intubation.  Primary team has also discussed with patient and he has stated that if he continues to decline would consider comfort measures.   Signed: Leonna Schlee, DO  05/08/2024, 11:21 AM [facility] Cardiology

## 2024-05-08 NOTE — Progress Notes (Addendum)
 Family requesting lift chair for patient at discharge. Requesting MD to order one if insurance will cover it. The name per family is a 'power lift reclining chair'. Sent message to [facility], LCSW.  Natalie Alexander, DO notified and aware.    UPDATE: Per DO, chair ordered. TBD if insurance will cover.

## 2024-05-08 NOTE — Plan of Care (Signed)

## 2024-05-08 NOTE — Progress Notes (Signed)
 PROGRESS NOTE    Zachary Deleon   FMW:[id number] DOB: Mar 03, 1966  DOA: 05/03/2024 Date of Service: 05/08/24 which is hospital day 4  PCP: [name], NP    Hospital course / significant events:   Zachary Deleon is a 58 y.o. male with COPD on 2 L oxygen at baseline with CHF, extensive tobacco abuse history, CAD, diabetes, carotid stenosis, hypertension, morbid obesity with a BMI of 45, who presents via EMS on 11/22 for respiratory distress.   HPI: progressive dyspnea over the last 3 weeks as well as a cough. It acutely worsened over the 24 hours prior to arrival. On EMS arrival he was 78% on home 2 L and was immediately placed on CPAP.   11/22: to ED. Elevated lactic acid, troponin and BNP. CXR concerning for pulmonary edema. He received IV Lasix.  11/23: admitted to hospitalist. Echo pending  11/24: 3L UOP yesterday, continue diuresis.  11/25: Pt into Afib RVR started on amio bolus/drip. Cardiology consult - repeat Echo, may consider cardioversion if not improving. Still needing BiPAP. 3.5L UOP yesterday 11/26: sinus rhythm. Continuing diuresis. Off BiPAP today and onto Warren. 4L UOP yesterday 11/27: continues to diurese, 3L UOP yesterday     Consultants:  Cardiology  PCCU    Procedures/Surgeries: none      ASSESSMENT & PLAN:   Acute on chronic HFmid-rangeEF with exacerbation Normal EF in 2019,  Echo this admission LVEF 40-45, mild RV dysfunction.  IV lasix 60 mg bid likely switch to po next few days / depending on diuresis  Farxiga 10 mg dialy Spiro 25 mg daily  Other GDMT as able / per cardiology Atorvastatin 80 mg daily  Hold coreg Cardiology following    Acute on chronic hypoxic respiratory failure - improved  2-3L O2 PN prn at baseline Required bipap initially, weaned to 3 liters. Affirms with wife and family present he does not want intubation, is aware we would pursue comfort if fails. Treating CHF and COPD as noted BiPAP as needed but weaning off  this so far    A-fib with rvr - resolved RVR Amio infusion --> transitioning to po today  Eliquis   AKI vs progression to ckd Cr 1.5s, was around 1 last year monitor while diuresing   Anemia Borderline microcytic. Denies bleeding. Labs consistent w/ iron deficiency. Hgb stable here monitor for now   Thrombocytopenia Chronic low 100s. Hiv neg. Smear unremarkable f/u  hcv  monitor   Staph hominis (+)BCx likely contaminant  In one set of blood cultures, likely contaminant   COPD Wheezing today, may be mediated by chf but prudent to treat methylpred   T2DM Poor control A1c 10.6. 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  Underweight - under 18  overweight - 25 to 29 obese - 30 or more Class 1 obesity: BMI of 30.0 to 34 Class 2 obesity: BMI of 35.0 to 39 Class 3 obesity: BMI of 40.0 to 49  Super Morbid Obesity: BMI 50-59 Super-super Morbid Obesity: BMI 60+ Healthy nutrition and physical activity advised as adjunct to other disease management and risk reduction treatments    DVT prophylaxis: eliquis IV fluids: no continuous IV fluids  Nutrition: carb modified Central lines / other devices: none  Code Status: DNR  ACP documentation reviewed: none on file in [facility]  TOC needs: TBD Medical barriers to dispo: diuresing. Expected medical readiness for discharge few days.              Subjective / Brief ROS:  Patient reports breathing  is better still SOB w/ minimal exertion Denies CP/SOB.  Pain controlled.    Family Communication: family at bedside on rounds     Objective Findings:  Vitals:   05/08/24 0715 05/08/24 0744 05/08/24 1121 05/08/24 1420  BP: 117/80  (!) 119/91 123/65  Pulse: 84  82 86  Resp: 19  19 19   Temp: 98 F (36.7 C)  98.4 F (36.9 C) 98 F (36.7 C)  TempSrc:    Oral  SpO2: 95% 96% 97% 98%  Weight:      Height:        Intake/Output Summary (Last 24 hours) at 05/08/2024 1605 Last data filed at 05/08/2024 1526 Gross per 24 hour  Intake 1951.02 ml  Output 3100 ml  Net -1148.98 ml   Filed Weights   05/06/24 0500 05/07/24 0424 05/08/24 0654  Weight: (!) 154.6 kg (!) 151 kg (!) 147.1 kg    Examination:  Physical Exam Constitutional:      General: He is not in acute distress. Cardiovascular:     Rate and Rhythm: Normal rate and regular rhythm.  Pulmonary:     Effort: Pulmonary effort is normal.     Breath sounds: Normal breath sounds.  Abdominal:     General: Bowel sounds are normal.     Palpations: Abdomen is soft.  Musculoskeletal:     Right lower leg: Edema present.     Left lower leg: Edema present.  Skin:    General: Skin is warm and dry.  Neurological:     Mental Status: He is alert and oriented to person, place, and time.  Psychiatric:        Mood and Affect: Mood normal.        Behavior: Behavior normal.          Scheduled Medications:   amiodarone  400 mg Oral BID   apixaban  5 mg Oral BID   atorvastatin  80 mg Oral Daily   cyclobenzaprine  10 mg Oral BID   dapagliflozin propanediol  10 mg Oral Daily   famotidine  20 mg Oral BID   fenofibrate  160 mg Oral q morning   FLUoxetine  40 mg Oral q AM   fluticasone furoate-vilanterol  1 puff Inhalation Daily   furosemide  60 mg Intravenous BID   guaiFENesin  600 mg Oral BID   insulin aspart  0-20 Units Subcutaneous TID WC   insulin aspart  0-5 Units Subcutaneous QHS   insulin aspart  10 Units  Subcutaneous TID WC   insulin glargine-yfgn  50 Units Subcutaneous Q24H   ipratropium-albuterol  3 mL Nebulization BID   pantoprazole  40 mg Oral q AM   pregabalin  75 mg Oral TID   sacubitril-valsartan  1 tablet Oral BID   spironolactone  25 mg Oral Daily   umeclidinium bromide  1 puff Inhalation Daily    Continuous Infusions:  amiodarone Stopped (05/08/24 1513)  PRN Medications:  acetaminophen  **OR** acetaminophen , albuterol , guaiFENesin -dextromethorphan, ondansetron  **OR** ondansetron  (ZOFRAN ) IV, zolpidem   Antimicrobials from admission:  Anti-infectives (From admission, onward)    Start     Dose/Rate Route Frequency Ordered Stop   05/04/24 0245  doxycycline  (VIBRA -TABS) tablet 100 mg  Status:  Discontinued        100 mg Oral Every 12 hours 05/04/24 0231 05/05/24 0938   05/04/24 0015  cefTRIAXone  (ROCEPHIN ) 1 g in sodium chloride  0.9 % 100 mL IVPB        1 g 200 mL/hr over 30 Minutes Intravenous  Once 05/04/24 0001 05/04/24 0104   05/04/24 0015  azithromycin  (ZITHROMAX ) 500 mg in sodium chloride  0.9 % 250 mL IVPB        500 mg 250 mL/hr over 60 Minutes Intravenous  Once 05/04/24 0001 05/04/24 0234           Data Reviewed:  I have personally reviewed the following...  CBC: Recent Labs  Lab 05/03/24 2257 05/04/24 0535 05/05/24 0322 05/05/24 1137  WBC 12.1* 8.3 11.4* 15.6*  NEUTROABS 8.7*  --   --  13.2*  HGB 9.0* 8.5* 9.0* 9.0*  HCT 29.3* 27.2* 28.2* 29.4*  MCV 81.6 80.5 80.3 81.7  PLT 134* 98* 175 240   Basic Metabolic Panel: Recent Labs  Lab 05/04/24 0535 05/05/24 0322 05/06/24 0349 05/07/24 0514 05/08/24 0419  NA 130* 133* 139 139 139  K 4.6 4.8 4.4 4.2 4.1  CL 95* 96* 98 98 97*  CO2 20* 23 28 28 30   GLUCOSE 447* 401* 191* 219* 230*  BUN 25* 36* 36* 38* 38*  CREATININE 1.59* 1.59* 1.49* 1.37* 1.50*  CALCIUM  7.9* 8.4* 8.7* 8.6* 8.8*  MG  --   --  2.1 2.5* 2.5*   GFR: Estimated Creatinine Clearance: 81.2 mL/min (A) (by C-G formula based on  SCr of 1.5 mg/dL (H)). Liver Function Tests: Recent Labs  Lab 05/03/24 2257 05/05/24 0322  AST 51* 55*  ALT 41 42  ALKPHOS 66 68  BILITOT 0.6 0.6  PROT 7.4 7.8  ALBUMIN 4.2 4.5   Recent Labs  Lab 05/03/24 2257  LIPASE 22   No results for input(s): AMMONIA in the last 168 hours. Coagulation Profile: No results for input(s): INR, PROTIME in the last 168 hours. Cardiac Enzymes: No results for input(s): CKTOTAL, CKMB, CKMBINDEX, TROPONINI in the last 168 hours. BNP (last 3 results) Recent Labs    05/03/24 2257  PROBNP 1,811.0*   HbA1C: No results for input(s): HGBA1C in the last 72 hours. CBG: Recent Labs  Lab 05/07/24 1547 05/07/24 2217 05/08/24 0716 05/08/24 1120 05/08/24 1301  GLUCAP 263* 266* 214* 186* 153*   Lipid Profile: No results for input(s): CHOL, HDL, LDLCALC, TRIG, CHOLHDL, LDLDIRECT in the last 72 hours. Thyroid Function Tests: No results for input(s): TSH, T4TOTAL, FREET4, T3FREE, THYROIDAB in the last 72 hours. Anemia Panel: No results for input(s): VITAMINB12, FOLATE, FERRITIN, TIBC, IRON, RETICCTPCT in the last 72 hours.  Most Recent Urinalysis On File:     Component Value Date/Time   COLORURINE YELLOW (A) 05/03/2024 2257   APPEARANCEUR HAZY (A) 05/03/2024 2257   LABSPEC 1.016 05/03/2024 2257   PHURINE 5.0 05/03/2024 2257   GLUCOSEU >=500 (A) 05/03/2024 2257   HGBUR NEGATIVE 05/03/2024 2257   BILIRUBINUR NEGATIVE 05/03/2024 2257   KETONESUR NEGATIVE 05/03/2024 2257   PROTEINUR 100 (A) 05/03/2024 2257   NITRITE NEGATIVE 05/03/2024 2257   LEUKOCYTESUR NEGATIVE 05/03/2024 2257   Sepsis Labs: @LABRCNTIP (procalcitonin:4,lacticidven:4) Microbiology: Recent  Results (from the past 240 hours)  Resp panel by RT-PCR (RSV, Flu A&B, Covid) Anterior Nasal Swab     Status: None   Collection Time: 05/03/24 10:57 PM   Specimen: Anterior Nasal Swab  Result Value Ref Range Status   SARS Coronavirus 2  by RT PCR NEGATIVE NEGATIVE Final    Comment: (NOTE) SARS-CoV-2 target nucleic acids are NOT DETECTED.  The SARS-CoV-2 RNA is generally detectable in upper respiratory specimens during the acute phase of infection. The lowest concentration of SARS-CoV-2 viral copies this assay can detect is 138 copies/mL. A negative result does not preclude SARS-Cov-2 infection and should not be used as the sole basis for treatment or other patient management decisions. A negative result may occur with  improper specimen collection/handling, submission of specimen other than nasopharyngeal swab, presence of viral mutation(s) within the areas targeted by this assay, and inadequate number of viral copies(<138 copies/mL). A negative result must be combined with clinical observations, patient history, and epidemiological information. The expected result is Negative.  Fact Sheet for Patients:  bloggercourse.com  Fact Sheet for Healthcare Providers:  seriousbroker.it  This test is no t yet approved or cleared by the [place]  FDA and  has been authorized for detection and/or diagnosis of SARS-CoV-2 by FDA under an Emergency Use Authorization (EUA). This EUA will remain  in effect (meaning this test can be used) for the duration of the COVID-19 declaration under Section 564(b)(1) of the Act, 21 U.S.C.section 360bbb-3(b)(1), unless the authorization is terminated  or revoked sooner.       Influenza A by PCR NEGATIVE NEGATIVE Final   Influenza B by PCR NEGATIVE NEGATIVE Final    Comment: (NOTE) The Xpert Xpress SARS-CoV-2/FLU/RSV plus assay is intended as an aid in the diagnosis of influenza from Nasopharyngeal swab specimens and should not be used as a sole basis for treatment. Nasal washings and aspirates are unacceptable for Xpert Xpress SARS-CoV-2/FLU/RSV testing.  Fact Sheet for Patients: bloggercourse.com  Fact  Sheet for Healthcare Providers: seriousbroker.it  This test is not yet approved or cleared by the [place]  FDA and has been authorized for detection and/or diagnosis of SARS-CoV-2 by FDA under an Emergency Use Authorization (EUA). This EUA will remain in effect (meaning this test can be used) for the duration of the COVID-19 declaration under Section 564(b)(1) of the Act, 21 U.S.C. section 360bbb-3(b)(1), unless the authorization is terminated or revoked.     Resp Syncytial Virus by PCR NEGATIVE NEGATIVE Final    Comment: (NOTE) Fact Sheet for Patients: bloggercourse.com  Fact Sheet for Healthcare Providers: seriousbroker.it  This test is not yet approved or cleared by the [place]  FDA and has been authorized for detection and/or diagnosis of SARS-CoV-2 by FDA under an Emergency Use Authorization (EUA). This EUA will remain in effect (meaning this test can be used) for the duration of the COVID-19 declaration under Section 564(b)(1) of the Act, 21 U.S.C. section 360bbb-3(b)(1), unless the authorization is terminated or revoked.  Performed at [facility], [address] [place], [place] [address]   Blood culture (routine x 2)     Status: Abnormal   Collection Time: 05/03/24 10:58 PM   Specimen: BLOOD  Result Value Ref Range Status   Specimen Description   Final    BLOOD RIGHT ANTECUBITAL Performed at [facility], [address] [place], [place] [address]    Special Requests   Final    BOTTLES DRAWN AEROBIC AND ANAEROBIC Blood Culture results may not  be optimal due to an inadequate volume of blood received in culture bottles Performed at [facility], [address] [place], [place] [address]    Culture  Setup Time   Final    Organism ID to follow GRAM POSITIVE COCCI IN BOTH AEROBIC AND ANAEROBIC BOTTLES CRITICAL RESULT CALLED TO, READ BACK BY AND  VERIFIED WITH: LUM HILARIO GOWER D AT 1546 05/04/24 RAM Performed at [facility], [address] [place], [place] [address]    Culture (A)  Final    STAPHYLOCOCCUS HOMINIS THE SIGNIFICANCE OF ISOLATING THIS ORGANISM FROM A SINGLE SET OF BLOOD CULTURES WHEN MULTIPLE SETS ARE DRAWN IS UNCERTAIN. PLEASE NOTIFY THE MICROBIOLOGY DEPARTMENT WITHIN ONE WEEK IF SPECIATION AND SENSITIVITIES ARE REQUIRED. Performed at [facility], [address], [place], [place] [address]    Report Status 05/07/2024 FINAL  Final  Blood Culture ID Panel (Reflexed)     Status: Abnormal   Collection Time: 05/03/24 10:58 PM  Result Value Ref Range Status   Enterococcus faecalis NOT DETECTED NOT DETECTED Final   Enterococcus Faecium NOT DETECTED NOT DETECTED Final   Listeria monocytogenes NOT DETECTED NOT DETECTED Final   Staphylococcus species DETECTED (A) NOT DETECTED Final    Comment: CRITICAL RESULT CALLED TO, READ BACK BY AND VERIFIED WITH: MADISON H., PHARM D AT 1546 05/04/24 RAM    Staphylococcus aureus (BCID) NOT DETECTED NOT DETECTED Final   Staphylococcus epidermidis NOT DETECTED NOT DETECTED Final   Staphylococcus lugdunensis NOT DETECTED NOT DETECTED Final   Streptococcus species NOT DETECTED NOT DETECTED Final   Streptococcus agalactiae NOT DETECTED NOT DETECTED Final   Streptococcus pneumoniae NOT DETECTED NOT DETECTED Final   Streptococcus pyogenes NOT DETECTED NOT DETECTED Final   A.calcoaceticus-baumannii NOT DETECTED NOT DETECTED Final   Bacteroides fragilis NOT DETECTED NOT DETECTED Final   Enterobacterales NOT DETECTED NOT DETECTED Final   Enterobacter cloacae complex NOT DETECTED NOT DETECTED Final   Escherichia coli NOT DETECTED NOT DETECTED Final   Klebsiella aerogenes NOT DETECTED NOT DETECTED Final   Klebsiella oxytoca NOT DETECTED NOT DETECTED Final   Klebsiella pneumoniae NOT DETECTED NOT DETECTED Final   Proteus species NOT DETECTED NOT DETECTED Final   Salmonella  species NOT DETECTED NOT DETECTED Final   Serratia marcescens NOT DETECTED NOT DETECTED Final   Haemophilus influenzae NOT DETECTED NOT DETECTED Final   Neisseria meningitidis NOT DETECTED NOT DETECTED Final   Pseudomonas aeruginosa NOT DETECTED NOT DETECTED Final   Stenotrophomonas maltophilia NOT DETECTED NOT DETECTED Final   Candida albicans NOT DETECTED NOT DETECTED Final   Candida auris NOT DETECTED NOT DETECTED Final   Candida glabrata NOT DETECTED NOT DETECTED Final   Candida krusei NOT DETECTED NOT DETECTED Final   Candida parapsilosis NOT DETECTED NOT DETECTED Final   Candida tropicalis NOT DETECTED NOT DETECTED Final   Cryptococcus neoformans/gattii NOT DETECTED NOT DETECTED Final    Comment: Performed at [facility], [address] [place], [place] [address]  Blood culture (routine x 2)     Status: None   Collection Time: 05/03/24 11:03 PM   Specimen: BLOOD  Result Value Ref Range Status   Specimen Description BLOOD BLOOD RIGHT FOREARM  Final   Special Requests   Final    BOTTLES DRAWN AEROBIC AND ANAEROBIC Blood Culture results may not be optimal due to an inadequate volume of blood received in culture bottles   Culture   Final    NO GROWTH 5 DAYS Performed at [facility], [address] [place], [place] [address]    Report Status 05/08/2024 FINAL  Final  Respiratory (~20 pathogens) panel by PCR     Status: None   Collection Time: 05/04/24  9:57 AM   Specimen: Nasopharyngeal Swab; Respiratory  Result Value Ref Range Status   Adenovirus NOT DETECTED NOT DETECTED Final   Coronavirus 229E NOT DETECTED NOT DETECTED Final    Comment: (NOTE) The Coronavirus on the Respiratory Panel, DOES NOT test for the novel  Coronavirus (2019 nCoV)    Coronavirus HKU1 NOT DETECTED NOT DETECTED Final   Coronavirus NL63 NOT DETECTED NOT DETECTED Final   Coronavirus OC43 NOT DETECTED NOT DETECTED Final   Metapneumovirus NOT DETECTED NOT DETECTED Final   Rhinovirus  / Enterovirus NOT DETECTED NOT DETECTED Final   Influenza A NOT DETECTED NOT DETECTED Final   Influenza B NOT DETECTED NOT DETECTED Final   Parainfluenza Virus 1 NOT DETECTED NOT DETECTED Final   Parainfluenza Virus 2 NOT DETECTED NOT DETECTED Final   Parainfluenza Virus 3 NOT DETECTED NOT DETECTED Final   Parainfluenza Virus 4 NOT DETECTED NOT DETECTED Final   Respiratory Syncytial Virus NOT DETECTED NOT DETECTED Final   Bordetella pertussis NOT DETECTED NOT DETECTED Final   Bordetella Parapertussis NOT DETECTED NOT DETECTED Final   Chlamydophila pneumoniae NOT DETECTED NOT DETECTED Final   Mycoplasma pneumoniae NOT DETECTED NOT DETECTED Final    Comment: Performed at [facility], [address] [place], [place] [address]      Radiology Studies last 3 days: [facility] 1 View Result Date: 05/06/2024 CLINICAL DATA:  Dyspnea. EXAM: PORTABLE CHEST 1 VIEW COMPARISON:  Radiographs 05/03/2024.  No other comparison studies. FINDINGS: 1304 hours. No significant change in cardiomegaly and asymmetric elevation of the right hemidiaphragm. Mild increase in generalized interstitial prominence which may reflect worsening edema. There is no confluent airspace disease, pneumothorax or significant pleural effusion. Mild degenerative changes in the spine without evidence of acute osseous abnormality. IMPRESSION: Cardiomegaly with possible increased mild interstitial edema. No confluent airspace disease or significant pleural effusion. Electronically Signed   By: Elsie Perone M.D.   On: 05/06/2024 14:43        Laneta Blunt, DO [facility] 05/08/2024, 4:05 PM    Dictation software may have been used to generate the above note. Typos may occur and escape review in typed/dictated notes. Please contact Dr Blunt directly for clarity if needed.  Staff may message me via secure chat in Epic  but this may not receive an immediate response,  please page me for urgent  matters!  If 7PM-7AM, please contact night coverage [email or web address]

## 2024-05-08 NOTE — Progress Notes (Signed)
 Amiodarone  gtt stopped per provider note (see Annalee Custovic note).   Oral amiodarone  administered prior to gtt stop (see MAR).

## 2024-05-09 DIAGNOSIS — I5023 Acute on chronic systolic (congestive) heart failure: Secondary | ICD-10-CM | POA: Diagnosis not present

## 2024-05-09 LAB — BASIC METABOLIC PANEL WITH GFR
Anion gap: 12 (ref 5–15)
BUN: 36 mg/dL — ABNORMAL HIGH (ref 6–20)
CO2: 29 mmol/L (ref 22–32)
Calcium: 9.1 mg/dL (ref 8.9–10.3)
Chloride: 97 mmol/L — ABNORMAL LOW (ref 98–111)
Creatinine, Ser: 1.4 mg/dL — ABNORMAL HIGH (ref 0.61–1.24)
GFR, Estimated: 58 mL/min — ABNORMAL LOW (ref >60)
Glucose, Bld: 234 mg/dL — ABNORMAL HIGH (ref 70–99)
Potassium: 4.2 mmol/L (ref 3.5–5.1)
Sodium: 138 mmol/L (ref 135–145)

## 2024-05-09 LAB — GLUCOSE, CAPILLARY
Glucose-Capillary: 225 mg/dL — ABNORMAL HIGH (ref 70–99)
Glucose-Capillary: 208 mg/dL — ABNORMAL HIGH (ref 70–99)
Glucose-Capillary: 236 mg/dL — ABNORMAL HIGH (ref 70–99)
Glucose-Capillary: 257 mg/dL — ABNORMAL HIGH (ref 70–99)

## 2024-05-09 LAB — MAGNESIUM: Magnesium: 2.2 mg/dL (ref 1.7–2.4)

## 2024-05-09 MED ORDER — INSULIN ASPART 100 UNIT/ML IJ SOLN: 15.0000 [IU] | Freq: Three times a day (TID) | INTRAMUSCULAR | Status: DC

## 2024-05-09 MED ADMIN — Apixaban Tab 5 MG: 5 mg | ORAL | NDC 00003089431

## 2024-05-09 MED ADMIN — Insulin Aspart Inj Soln 100 Unit/ML: 7 [IU] | SUBCUTANEOUS | NDC 73070010011

## 2024-05-09 MED ADMIN — Guaifenesin Tab ER 12HR 600 MG: 600 mg | ORAL | NDC 68084057211

## 2024-05-09 MED ADMIN — Cyclobenzaprine HCl Tab 10 MG: 10 mg | ORAL | NDC 60687055811

## 2024-05-09 MED ADMIN — Furosemide Inj 10 MG/ML: 60 mg | INTRAVENOUS | NDC 71288020302

## 2024-05-09 MED ADMIN — Pregabalin Cap 75 MG: 75 mg | ORAL | NDC 60687049511

## 2024-05-09 MED ADMIN — Spironolactone Tab 25 MG: 25 mg | ORAL | NDC 60687046511

## 2024-05-09 MED ADMIN — Umeclidinium Br Aero Powd Breath Act 62.5 MCG/ACT (Base Eq): 1 | RESPIRATORY_TRACT | NDC 00173087306

## 2024-05-09 MED ADMIN — Atorvastatin Calcium Tab 80 MG (Base Equivalent): 80 mg | ORAL | NDC 00904629304

## 2024-05-09 MED ADMIN — Insulin Aspart Inj Soln 100 Unit/ML: 3 [IU] | SUBCUTANEOUS | NDC 73070010011

## 2024-05-09 MED ADMIN — Fluticasone Furoate-Vilanterol Aero Powd BA 200-25 MCG/ACT: 1 | RESPIRATORY_TRACT | NDC 00173088214

## 2024-05-09 MED ADMIN — Fenofibrate Tab 160 MG: 160 mg | ORAL | NDC 42858066045

## 2024-05-09 MED ADMIN — Sacubitril-Valsartan Tab 24-26 MG: 1 | ORAL | NDC 72205028018

## 2024-05-09 MED ADMIN — Insulin Aspart Inj Soln 100 Unit/ML: 15 [IU] | SUBCUTANEOUS | NDC 73070010011

## 2024-05-09 MED ADMIN — Famotidine Tab 20 MG: 20 mg | ORAL | NDC 00904719361

## 2024-05-09 MED ADMIN — Pantoprazole Sodium EC Tab 40 MG (Base Equiv): 40 mg | ORAL | NDC 51079005101

## 2024-05-09 MED ADMIN — Fluoxetine HCl Cap 20 MG: 40 mg | ORAL | NDC 00904734661

## 2024-05-09 MED ADMIN — Amiodarone HCl Tab 200 MG: 400 mg | ORAL | NDC 60687043711

## 2024-05-09 MED ADMIN — Dapagliflozin Propanediol Tab 10 MG (Base Equivalent): 10 mg | ORAL | NDC 66993045730

## 2024-05-09 MED ADMIN — Acetaminophen Tab 325 MG: 650 mg | ORAL | NDC 00904677361

## 2024-05-09 MED ADMIN — Bisacodyl Tab Delayed Release 5 MG: 10 mg | ORAL | NDC 00904640761

## 2024-05-09 MED ADMIN — Insulin Glargine-yfgn Inj 100 Unit/ML: 55 [IU] | SUBCUTANEOUS | NDC 83257001411

## 2024-05-09 MED ADMIN — Ipratropium-Albuterol Nebu Soln 0.5-2.5(3) MG/3ML: 3 mL | RESPIRATORY_TRACT | NDC 60687040579

## 2024-05-09 MED FILL — Bisacodyl Tab Delayed Release 5 MG: 10.0000 mg | ORAL | Qty: 2 | Status: AC

## 2024-05-09 MED FILL — Insulin Aspart Inj Soln 100 Unit/ML: 15.0000 [IU] | INTRAMUSCULAR | Qty: 15 | Status: AC

## 2024-05-09 MED FILL — Insulin Glargine-yfgn Inj 100 Unit/ML: 55.0000 [IU] | SUBCUTANEOUS | Qty: 0.55 | Status: AC

## 2024-05-09 NOTE — Progress Notes (Signed)
 [facility] CLINIC CARDIOLOGY PROGRESS NOTE       Patient ID: Zachary Deleon MRN: [id number] DOB/AGE: [birth year] 58 y.o.  Admit date: 05/03/2024 Referring Physician Dr. Devaughn Ban Primary Physician Baldwin, Grayce Livings, NP  Primary Cardiologist None Reason for Consultation AoCHF  HPI: Zachary Deleon is a 58 y.o. male  with a past medical history of  COPD on 2 L oxygen at baseline with CHF, extensive tobacco abuse history, CAD, diabetes, carotid stenosis, hypertension, morbid obesity who presented to the ED on 05/03/2024 for SOB, cough. Treated for CHF exacerbation, initially was improving but today with worsening status. Cardiology was consulted for further evaluation.   Interval history: Patient continues to improve. Just finished working with PT.  Review of systems complete and found to be negative unless listed above    Past Medical History:  Diagnosis Date   CHF (congestive heart failure) (HCC)    COPD (chronic obstructive pulmonary disease) (HCC)    Diabetes mellitus without complication (HCC)    Stroke (HCC)      Medications Prior to Admission  Medication Sig Dispense Refill Last Dose/Taking   acetaminophen  (TYLENOL ) 325 MG tablet Take 650 mg by mouth every 6 (six) hours as needed.   Past Month   albuterol  (VENTOLIN  HFA) 108 (90 Base) MCG/ACT inhaler Inhale 2 puffs into the lungs every 6 (six) hours as needed for wheezing or shortness of breath.   Past Month   amLODipine  (NORVASC ) 10 MG tablet Take 10 mg by mouth at bedtime.   Past Week   apixaban  (ELIQUIS ) 5 MG TABS tablet Take 5 mg by mouth 2 (two) times daily.   Past Week   atorvastatin  (LIPITOR ) 80 MG tablet Take 80 mg by mouth daily.   Past Week   cyclobenzaprine  (FLEXERIL ) 10 MG tablet Take 10 mg by mouth 2 (two) times daily.   Past Week   famotidine  (PEPCID ) 20 MG tablet Take 20 mg by mouth 2 (two) times daily.   Past Week   fenofibrate  160 MG tablet Take 160 mg by mouth every morning.   Past Week   FLUoxetine  (PROZAC )  40 MG capsule Take 40 mg by mouth in the morning.   Past Week   fluticasone -salmeterol (ADVAIR DISKUS) 250-50 MCG/ACT AEPB Inhale 1 puff into the lungs in the morning and at bedtime.   Past Week   insulin  aspart (NOVOLOG ) 100 UNIT/ML injection Inject 20 Units into the skin 3 (three) times daily before meals.   Past Week   insulin  glargine, 1 Unit Dial, (TOUJEO  SOLOSTAR) 300 UNIT/ML Solostar Pen Inject 65 Units into the skin daily.   Past Week   linagliptin (TRADJENTA) 5 MG TABS tablet Take 5 mg by mouth in the morning.   Past Week   loratadine (CLARITIN) 10 MG tablet Take 10 mg by mouth in the morning.   Past Week   losartan (COZAAR) 50 MG tablet Take 75 mg by mouth in the morning.   Past Week   metFORMIN (GLUCOPHAGE) 500 MG tablet Take 1,000 mg by mouth 2 (two) times daily with a meal.   Past Week   Multiple Vitamin (MULTIVITAMIN WITH MINERALS) TABS tablet Take 1 tablet by mouth in the morning.   Past Week   pantoprazole  (PROTONIX ) 40 MG tablet Take 40 mg by mouth in the morning.   Past Week   pregabalin  (LYRICA ) 75 MG capsule Take 75 mg by mouth 3 (three) times daily.   Past Week   Tiotropium Bromide  (SPIRIVA HANDIHALER) 18 MCG CAPS Place 1  capsule into inhaler and inhale in the morning.   Past Week   sennosides-docusate sodium  (SENOKOT-S) 8.6-50 MG tablet Take 1 tablet by mouth daily. (Patient not taking: Reported on 05/04/2024)   Not Taking   Social History   Socioeconomic History   Marital status: Married    Spouse name: Not on file   Number of children: Not on file   Years of education: Not on file   Highest education level: Not on file  Occupational History   Not on file  Tobacco Use   Smoking status: Former    Types: Cigarettes   Smokeless tobacco: Not on file  Substance and Sexual Activity   Alcohol use: Not Currently   Drug use: Not Currently   Sexual activity: Not on file  Other Topics Concern   Not on file  Social History Narrative   Not on file   Social Drivers of  Health   Financial Resource Strain: Low Risk  (05/06/2024)   Overall Financial Resource Strain (CARDIA)    Difficulty of Paying Living Expenses: Not hard at all  Food Insecurity: No Food Insecurity (05/04/2024)   Hunger Vital Sign    Worried About Running Out of Food in the Last Year: Never true    Ran Out of Food in the Last Year: Never true  Transportation Needs: No Transportation Needs (05/04/2024)   PRAPARE - Administrator, Civil Service (Medical): No    Lack of Transportation (Non-Medical): No  Physical Activity: Not on file  Stress: Not on file  Social Connections: Moderately Integrated (05/04/2024)   Social Connection and Isolation Panel    Frequency of Communication with Friends and Family: More than three times a week    Frequency of Social Gatherings with Friends and Family: Twice a week    Attends Religious Services: 1 to 4 times per year    Active Member of [employer] or Organizations: No    Attends Banker Meetings: Patient unable to answer    Marital Status: Married  Catering Manager Violence: Not At Risk (05/04/2024)   Humiliation, Afraid, Rape, and Kick questionnaire    Fear of Current or Ex-Partner: No    Emotionally Abused: No    Physically Abused: No    Sexually Abused: No    No family history on file.   Vitals:   05/08/24 2101 05/08/24 2344 05/09/24 0459 05/09/24 0827  BP: 129/87 (!) 102/49 126/75 125/74  Pulse: 83 77 80 80  Resp: 18 20 18 18   Temp: 97.8 F (36.6 C) 97.9 F (36.6 C) 97.9 F (36.6 C) 98.2 F (36.8 C)  TempSrc: Oral Oral Oral Oral  SpO2: 99% 99% 93% 93%  Weight:   (!) 146.1 kg   Height:        PHYSICAL EXAM General: Ill appearing male, well nourished. Morbidly obese. HEENT: Normocephalic and atraumatic. Neck: No JVD.  Lungs: Increased WOB on BiPAP. Breath sounds diminished bilaterally. Heart: HRRR. Normal S1 and S2 without gallops or murmurs.  Abdomen: Non-distended appearing.  Msk: Normal strength and tone  for age. Extremities: Warm and well perfused. No clubbing, cyanosis. 1+ pitting edema bilaterally.  Neuro: Alert and oriented X 3. Psych: Answers questions appropriately.   Labs: Basic Metabolic Panel: Recent Labs    05/08/24 0419 05/09/24 0440  NA 139 138  K 4.1 4.2  CL 97* 97*  CO2 30 29  GLUCOSE 230* 234*  BUN 38* 36*  CREATININE 1.50* 1.40*  CALCIUM  8.8* 9.1  MG  2.5* 2.2   Liver Function Tests: No results for input(s): AST, ALT, ALKPHOS, BILITOT, PROT, ALBUMIN in the last 72 hours.  No results for input(s): LIPASE, AMYLASE in the last 72 hours.  CBC: No results for input(s): WBC, NEUTROABS, HGB, HCT, MCV, PLT in the last 72 hours.  Cardiac Enzymes: No results for input(s): CKTOTAL, CKMB, CKMBINDEX, TROPONINIHS in the last 72 hours. BNP: No results for input(s): BNP in the last 72 hours. D-Dimer: Recent Labs    05/06/24 1356  DDIMER 2.58*   Hemoglobin A1C: No results for input(s): HGBA1C in the last 72 hours.  Fasting Lipid Panel: No results for input(s): CHOL, HDL, LDLCALC, TRIG, CHOLHDL, LDLDIRECT in the last 72 hours. Thyroid Function Tests: No results for input(s): TSH, T4TOTAL, T3FREE, THYROIDAB in the last 72 hours.  Invalid input(s): FREET3 Anemia Panel: No results for input(s): VITAMINB12, FOLATE, FERRITIN, TIBC, IRON, RETICCTPCT in the last 72 hours.    Radiology: [facility] Chest Port 1 View Result Date: 05/06/2024 CLINICAL DATA:  Dyspnea. EXAM: PORTABLE CHEST 1 VIEW COMPARISON:  Radiographs 05/03/2024.  No other comparison studies. FINDINGS: 1304 hours. No significant change in cardiomegaly and asymmetric elevation of the right hemidiaphragm. Mild increase in generalized interstitial prominence which may reflect worsening edema. There is no confluent airspace disease, pneumothorax or significant pleural effusion. Mild degenerative changes in the spine without evidence of acute osseous  abnormality. IMPRESSION: Cardiomegaly with possible increased mild interstitial edema. No confluent airspace disease or significant pleural effusion. Electronically Signed   By: Elsie Perone M.D.   On: 05/06/2024 14:43   ECHOCARDIOGRAM COMPLETE Result Date: 05/04/2024    ECHOCARDIOGRAM REPORT   Patient Name:   Zachary Deleon Date of Exam: 05/04/2024 Medical Rec #:  [id number]    Height:       74.0 in Accession #:    [id number]   Weight:       352.0 lb Date of Birth:  February 12, 1966    BSA:          2.764 m Patient Age:    58 years     BP:           148/85 mmHg Patient Gender: M            HR:           109 bpm. Exam Location:  [facility] Procedure: 2D Echo, Cardiac Doppler, Color Doppler and Intracardiac            Opacification Agent (Both Spectral and Color Flow Doppler were            utilized during procedure). STAT ECHO Indications:     CHF I50.31  History:         Patient has no prior history of Echocardiogram examinations.  Sonographer:     Thedora Louder RDCS, FASE Referring Phys:  [id number] LORANE POLAND Diagnosing Phys: Denyse Bathe  Sonographer Comments: Technically challenging study due to limited acoustic windows, suboptimal apical window, no subcostal window and patient is obese. Image acquisition challenging due to patient body habitus and Image acquisition challenging due to respiratory motion. IMPRESSIONS  1. Left ventricular ejection fraction, by estimation, is 40 to 45%. The left ventricle has mildly decreased function. The left ventricle demonstrates global hypokinesis. The left ventricular internal cavity size was mildly to moderately dilated. There is moderate concentric left ventricular hypertrophy. Left ventricular diastolic parameters were normal.  2. Right ventricular systolic function is low normal. The right ventricular size is mildly enlarged. Mildly increased right ventricular wall  thickness.  3. Left atrial size was mild to moderately dilated.  4. Right atrial size was mild to moderately  dilated.  5. The mitral valve is grossly normal. Trivial mitral valve regurgitation.  6. The aortic valve is grossly normal. Aortic valve regurgitation is mild. FINDINGS  Left Ventricle: Left ventricular ejection fraction, by estimation, is 40 to 45%. The left ventricle has mildly decreased function. The left ventricle demonstrates global hypokinesis. Definity  contrast agent was given IV to delineate the left ventricular  endocardial borders. Strain was performed and the global longitudinal strain is indeterminate. The left ventricular internal cavity size was mildly to moderately dilated. There is moderate concentric left ventricular hypertrophy. Left ventricular diastolic parameters were normal. Right Ventricle: The right ventricular size is mildly enlarged. Mildly increased right ventricular wall thickness. Right ventricular systolic function is low normal. Left Atrium: Left atrial size was mild to moderately dilated. Right Atrium: Right atrial size was mild to moderately dilated. Pericardium: Trivial pericardial effusion is present. The pericardial effusion is circumferential. Mitral Valve: The mitral valve is grossly normal. Trivial mitral valve regurgitation. Tricuspid Valve: The tricuspid valve is grossly normal. Tricuspid valve regurgitation is trivial. Aortic Valve: The aortic valve is grossly normal. Aortic valve regurgitation is mild. Pulmonic Valve: The pulmonic valve was grossly normal. Pulmonic valve regurgitation is trivial. Aorta: The aortic root, ascending aorta and aortic arch are all structurally normal, with no evidence of dilitation or obstruction. IAS/Shunts: No atrial level shunt detected by color flow Doppler. Additional Comments: 3D was performed not requiring image post processing on an independent workstation and was indeterminate.  LEFT VENTRICLE PLAX 2D LVIDd:         6.90 cm      Diastology LVIDs:         5.50 cm      LV e' lateral:   10.90 cm/s LV PW:         1.00 cm      LV E/e'  lateral: 8.3 LV IVS:        1.10 cm LVOT diam:     2.20 cm LVOT Area:     3.80 cm  LV Volumes (MOD) LV vol d, MOD A2C: 200.0 ml LV vol d, MOD A4C: 175.0 ml LV vol s, MOD A2C: 114.0 ml LV vol s, MOD A4C: 105.0 ml LV SV MOD A2C:     86.0 ml LV SV MOD A4C:     175.0 ml LV SV MOD BP:      74.5 ml LEFT ATRIUM           Index LA Vol (A4C): 59.0 ml 21.35 ml/m                        PULMONIC VALVE AORTA                 PV Vmax:        1.04 m/s Ao Root diam: 3.20 cm PV Peak grad:   4.3 mmHg Ao Asc diam:  2.90 cm RVOT Peak grad: 3 mmHg  MITRAL VALVE MV Area (PHT): 4.19 cm    SHUNTS MV Decel Time: 181 msec    Systemic Diam: 2.20 cm MV E velocity: 90.70 cm/s MV A velocity: 78.60 cm/s MV E/A ratio:  1.15 Shaukat Khan Electronically signed by Denyse Bathe Signature Date/Time: 05/04/2024/1:21:33 PM    Final    DG Chest Portable 1 View Result Date: 05/03/2024 EXAM: 1 VIEW(S) XRAY OF THE CHEST 05/03/2024 11:19:00  PM COMPARISON: None available. CLINICAL HISTORY: shortness of breath FINDINGS: LUNGS AND PLEURA: Elevated right hemidiaphragm and low lung volumes. Mild pulmonary edema. No pleural effusion. No pneumothorax. HEART AND MEDIASTINUM: Cardiomegaly. BONES AND SOFT TISSUES: No acute osseous abnormality. IMPRESSION: 1. Mild pulmonary edema. 2. Cardiomegaly. Electronically signed by: Oneil Devonshire MD 05/03/2024 11:26 PM EST RP Workstation: [id number]    ECHO as above  TELEMETRY (personally reviewed): Sinus rhythm rate 80-90s  EKG (personally reviewed): sinus tach, PVC rate 101 bpm, repeat today with AF RVR rate 138 bpm  Data reviewed by me 05/09/2024: last 24h vitals tele labs imaging I/O ED provider note, admission H&P, hospitalist progress note, PCCM note, palliative note  Principal Problem:   CHF exacerbation (HCC) Active Problems:   Obesity, Class III, BMI 40-49.9 (morbid obesity) (HCC)   History of DVT (deep vein thrombosis)   History of pulmonary embolus (PE)   Seizure disorder (HCC)   History of CVA  (cerebrovascular accident)   T2DM (type 2 diabetes mellitus) (HCC)   COPD (chronic obstructive pulmonary disease) (HCC)   Essential hypertension   PAD (peripheral artery disease)   History of CEA (carotid endarterectomy)   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 male  with a past medical history of  COPD on 2 L oxygen at baseline with CHF, extensive tobacco abuse history, CAD, diabetes, carotid stenosis, hypertension, morbid obesity who presented to the ED on 05/03/2024 for SOB, cough. Treated for CHF exacerbation, initially was improving but today with worsening status. Cardiology was consulted for further evaluation.   # Acute on chronic HFmrEF # Atrial fibrillation RVR, new onset # Hypertension # COPD # Demand ischemia Patient initially presented for worsening shortness of breath and was treated with IV diuresis.  Respiratory status worsening today and requiring BiPAP.  Noted to have tachycardia and telemetry/EKG consistent with atrial fibrillation RVR.  Previously on anticoagulation for prior DVT/PE but no history of A-fib. - Continue IV amiodarone  infusion for 1 hour after receiving first PO dose. He will take amio 400 mg BID for 10 days followed by amio 200 mg BID thereafter. - Continue Eliquis  5 mg twice daily for stroke risk reduction.  If unable to take p.o. meds due to requiring BiPAP then would switch to IV anticoagulation. - Continue IV Lasix  60 mg twice daily. Plan to switch to PO likely Saturday 11/29. - Continue Farxiga  10 mg daily, spironolactone  25 mg daily to help augment diuresis.  Further additions to GDMT as able.  Was on Coreg  previously but this is held due to acute decompensated heart failure. - Continue atorvastatin  80 mg daily. - Mild and flat troponin most consistent with demand/supply mismatch and not ACS in the setting of AoCHF. - I did discuss CODE STATUS with  patient and family on 05/06/24.  He confirms that he is DNR/DNI - would not want CPR or intubation.  Primary team has also discussed with patient and he has stated that if he continues to decline would consider comfort measures.   Signed: Sherissa Tenenbaum, DO  05/09/2024, 10:42 AM [facility]

## 2024-05-09 NOTE — Plan of Care (Signed)

## 2024-05-09 NOTE — Progress Notes (Signed)
 Occupational Therapy Treatment Patient Details Name: Zachary Deleon MRN: [id number] DOB: 09/21/1965 Today's Date: 05/09/2024   History of present illness Zachary Deleon is a 58 y.o. male with COPD on 2 L oxygen at baseline with CHF, extensive tobacco abuse history, CAD, diabetes, carotid stenosis, hypertension, morbid obesity with a BMI of 45, who presents via EMS on 11/22 for respiratory distress.   OT comments  Pt seen for OT treatment with supportive spouse and daughters present. Family & pt eager to discharge home. OT provides education with recommendation of DME needed for continued safety and independence, problem-solving safe discharge with home entry access, and edu on need for caregiver assist on weak L-side while mobilizing. Pt performs STS transfers with bari RW + MIN A, remains on 4L O2 via [place] with SpO2 93%> throughout. Daughters providing assist for close chair follow and O2 mgmt for x2 bouts of mobility ~10 ft using RW, MIN A from OT for stability while ambulating. Pt with poor activity tolerance, needing to sit quickly after minimal walking. Pt making good progress towards goals, OT will continue to follow acutely.       If plan is discharge home, recommend the following:  Help with stairs or ramp for entrance;A little help with walking and/or transfers;A little help with bathing/dressing/bathroom   Equipment Recommendations  BSC/3in1;Other (comment) (bari RW, bari BSC, bed rail, power lift reclining chair)       Precautions / Restrictions Precautions Precautions: Fall Recall of Precautions/Restrictions: Intact Restrictions Weight Bearing Restrictions Per Provider Order: No       Mobility Bed Mobility               General bed mobility comments: NT, received in recliner    Transfers Overall transfer level: Needs assistance Equipment used: Rolling walker (2 wheels) (bari RW) Transfers: Sit to/from Stand Sit to Stand: Min assist           General transfer  comment: cues for hand placement and general body mechanics     Balance Overall balance assessment: Needs assistance Sitting-balance support: No upper extremity supported, Feet supported Sitting balance-Leahy Scale: Good     Standing balance support: Bilateral upper extremity supported Standing balance-Leahy Scale: Fair Standing balance comment: minimal standing tolerance, pt with LLE instability 2/2 hx of CVA, body habitus impacting cardiopulm status. pt needing to sit spontaneously 2x                           ADL either performed or assessed with clinical judgement   ADL Overall ADL's : Needs assistance/impaired                       Lower Body Dressing Details (indicate cue type and reason): discussed LB AE for increased independence - pt and family state he will have assist for LB ADLs   Toilet Transfer Details (indicate cue type and reason): discussed use of bari BSC for at home use                 Communication Communication Communication: No apparent difficulties   Cognition Arousal: Alert Behavior During Therapy: WFL for tasks assessed/performed Cognition: No apparent impairments                               Following commands: Intact        Cueing   Cueing Techniques: Verbal cues  Exercises  Exercises: Other exercises Other Exercises Other Exercises: edu on role and purpose of OT, extensive time spent edu and answering questions re: discharge recommendations, DME and AE, problem-solving transfer and transition home Other Exercises: recommending bari RW, bari BSC, power lift reclining chair, bed rail Other Exercises: problem-solved safe hospital d/c with energy conservation strategies given multiple transfers and getting into home with ramp. discussed close chair follow at home if planning on walking up ramp, edu on taking standing and seated rest breaks with caregiver on weak L side (hx of CVA), hand placement and +2 assist  for safety Other Exercises: daughters and spouse present and assisting throughout during session to provide chair follow and assist for O2 mgmt    Shoulder Instructions       General Comments      Pertinent Vitals/ Pain       Pain Assessment Pain Assessment: No/denies pain         Frequency  Min 2X/week        Progress Toward Goals  OT Goals(current goals can now be found in the care plan section)  Progress towards OT goals: Progressing toward goals  Acute Rehab OT Goals OT Goal Formulation: With patient/family Time For Goal Achievement: 05/18/24 Potential to Achieve Goals: Good ADL Goals Pt Will Perform Grooming: with modified independence;standing Pt Will Perform Lower Body Dressing: with min assist;with caregiver independent in assisting;sit to/from stand Pt Will Transfer to Toilet: with modified independence;ambulating;regular height toilet  Plan      Co-evaluation                 AM-PAC OT 6 Clicks Daily Activity     Outcome Measure   Help from another person eating meals?: None Help from another person taking care of personal grooming?: A Little Help from another person toileting, which includes using toliet, bedpan, or urinal?: A Little Help from another person bathing (including washing, rinsing, drying)?: A Lot Help from another person to put on and taking off regular upper body clothing?: A Little Help from another person to put on and taking off regular lower body clothing?: A Lot 6 Click Score: 17    End of Session Equipment Utilized During Treatment: Gait belt;Oxygen (bari RW)  OT Visit Diagnosis: Other abnormalities of gait and mobility (R26.89);Muscle weakness (generalized) (M62.81)   Activity Tolerance Patient tolerated treatment well   Patient Left in chair;with call bell/phone within reach;with chair alarm set;with family/visitor present   Nurse Communication Mobility status        Time: 8590-8540 OT Time Calculation  (min): 50 min  Charges: OT General Charges $OT Visit: 1 Visit OT Treatments $Self Care/Home Management : 23-37 mins $Therapeutic Activity: 8-22 mins Daneisha Surges L. Clytee Heinrich, OTR/L  05/09/24, 4:47 PM

## 2024-05-09 NOTE — Progress Notes (Signed)
 Physical Therapy Treatment Patient Details Name: Zachary Deleon MRN: [id number] DOB: [birth year] Today's Date: 05/09/2024   History of Present Illness Zachary Deleon is a 58 y.o. male with COPD on 2 L oxygen at baseline with CHF, extensive tobacco abuse history, CAD, diabetes, carotid stenosis, hypertension, morbid obesity with a BMI of 45, who presents via EMS on 11/22 for respiratory distress.    PT Comments  Pt is making gradual progress towards goals. Pt very eager to dc home, long discussion on home safety and equipment recs. Able to ambulate in hallway with close chair follow using RW. Would benefit from BRW at this time. Educated on use of RW vs SPC.  SaO2 on room air at rest = 96% SaO2 on room air while ambulating = 87% SaO2 on 3 liters of O2 while ambulating = 96%      If plan is discharge home, recommend the following: A little help with walking and/or transfers   Can travel by private vehicle        Equipment Recommendations   (BRW and Bariatric BSC)    Recommendations for Other Services       Precautions / Restrictions Precautions Precautions: Fall Recall of Precautions/Restrictions: Intact Restrictions Weight Bearing Restrictions Per Provider Order: No     Mobility  Bed Mobility               General bed mobility comments: NT, received in recliner    Transfers Overall transfer level: Needs assistance Equipment used: 1 person hand held assist, Straight cane Transfers: Sit to/from Stand Sit to Stand: Min assist           General transfer comment: needs cues for sequencing and to not hold breath with exertion    Ambulation/Gait Ambulation/Gait assistance: Contact guard assist Gait Distance (Feet): 40 Feet Assistive device: Rolling walker (2 wheels) Gait Pattern/deviations: Step-to pattern       General Gait Details: ambulated 2 x 20' with 1 seated rest break between bouts. Close chair follow. SPC initially used, however deemed not safe and RW  use for remainder of ambulation.   Stairs             Wheelchair Mobility     Tilt Bed    Modified Rankin (Stroke Patients Only)       Balance Overall balance assessment: Needs assistance Sitting-balance support: No upper extremity supported, Feet supported Sitting balance-Leahy Scale: Good     Standing balance support: Bilateral upper extremity supported Standing balance-Leahy Scale: Fair                              Hotel Manager: No apparent difficulties  Cognition Arousal: Alert Behavior During Therapy: WFL for tasks assessed/performed   PT - Cognitive impairments: No apparent impairments                       PT - Cognition Comments: pleasant and agreeable Following commands: Intact      Cueing Cueing Techniques: Verbal cues  Exercises      General Comments        Pertinent Vitals/Pain Pain Assessment Pain Assessment: No/denies pain    Home Living                          Prior Function            PT Goals (current goals can now be found  in the care plan section) Acute Rehab PT Goals Patient Stated Goal: to go home PT Goal Formulation: With patient Time For Goal Achievement: 05/19/24 Potential to Achieve Goals: Good Progress towards PT goals: Progressing toward goals    Frequency    Min 1X/week      PT Plan      Co-evaluation              AM-PAC PT 6 Clicks Mobility   Outcome Measure  Help needed turning from your back to your side while in a flat bed without using bedrails?: None Help needed moving from lying on your back to sitting on the side of a flat bed without using bedrails?: None Help needed moving to and from a bed to a chair (including a wheelchair)?: A Little Help needed standing up from a chair using your arms (e.g., wheelchair or bedside chair)?: A Little Help needed to walk in hospital room?: A Little Help needed climbing 3-5 steps with a  railing? : Total 6 Click Score: 18    End of Session Equipment Utilized During Treatment: Oxygen Activity Tolerance: Patient limited by fatigue Patient left: in chair;with family/visitor present Nurse Communication: Mobility status PT Visit Diagnosis: Unsteadiness on feet (R26.81);Other abnormalities of gait and mobility (R26.89);Difficulty in walking, not elsewhere classified (R26.2)     Time: 8891-8863 PT Time Calculation (min) (ACUTE ONLY): 28 min  Charges:    $Gait Training: 23-37 mins PT General Charges $$ ACUTE PT VISIT: 1 Visit                     Corean Dade, PT, DPT, GCS [phone]    Travontae Freiberger 05/09/2024, 1:32 PM

## 2024-05-10 ENCOUNTER — Other Ambulatory Visit: Payer: Self-pay

## 2024-05-10 DIAGNOSIS — I5023 Acute on chronic systolic (congestive) heart failure: Secondary | ICD-10-CM | POA: Diagnosis not present

## 2024-05-10 LAB — GLUCOSE, CAPILLARY
Glucose-Capillary: 228 mg/dL — ABNORMAL HIGH (ref 70–99)
Glucose-Capillary: 213 mg/dL — ABNORMAL HIGH (ref 70–99)
Glucose-Capillary: 246 mg/dL — ABNORMAL HIGH (ref 70–99)

## 2024-05-10 LAB — BASIC METABOLIC PANEL WITH GFR
Anion gap: 10 (ref 5–15)
BUN: 34 mg/dL — ABNORMAL HIGH (ref 6–20)
CO2: 30 mmol/L (ref 22–32)
Calcium: 9.4 mg/dL (ref 8.9–10.3)
Chloride: 97 mmol/L — ABNORMAL LOW (ref 98–111)
Creatinine, Ser: 1.5 mg/dL — ABNORMAL HIGH (ref 0.61–1.24)
GFR, Estimated: 53 mL/min — ABNORMAL LOW (ref >60)
Glucose, Bld: 212 mg/dL — ABNORMAL HIGH (ref 70–99)
Potassium: 4.3 mmol/L (ref 3.5–5.1)
Sodium: 137 mmol/L (ref 135–145)

## 2024-05-10 LAB — MAGNESIUM: Magnesium: 2.1 mg/dL (ref 1.7–2.4)

## 2024-05-10 MED ORDER — DAPAGLIFLOZIN PROPANEDIOL 10 MG PO TABS
10.0000 mg | ORAL_TABLET | Freq: Every day | ORAL | 0 refills | Status: AC
  Filled 2024-05-10: qty 30, 30d supply, fill #0

## 2024-05-10 MED ORDER — FUROSEMIDE 40 MG PO TABS
40.0000 mg | ORAL_TABLET | Freq: Two times a day (BID) | ORAL | 0 refills | Status: AC
  Filled 2024-05-10: qty 90, 45d supply, fill #0

## 2024-05-10 MED ORDER — UMECLIDINIUM BROMIDE 62.5 MCG/ACT IN AEPB: 1.0000 | INHALATION_SPRAY | Freq: Every day | RESPIRATORY_TRACT | Status: AC

## 2024-05-10 MED ORDER — FLUTICASONE FUROATE-VILANTEROL 200-25 MCG/ACT IN AEPB: 1.0000 | INHALATION_SPRAY | Freq: Every day | RESPIRATORY_TRACT | Status: AC

## 2024-05-10 MED ORDER — SACUBITRIL-VALSARTAN 24-26 MG PO TABS
1.0000 | ORAL_TABLET | Freq: Two times a day (BID) | ORAL | 0 refills | Status: AC
  Filled 2024-05-10: qty 60, 30d supply, fill #0

## 2024-05-10 MED ORDER — SENNA-DOCUSATE SODIUM 8.6-50 MG PO TABS: 2.0000 | ORAL_TABLET | Freq: Every day | ORAL | Status: AC | PRN

## 2024-05-10 MED ORDER — GUAIFENESIN ER 600 MG PO TB12: 600.0000 mg | ORAL_TABLET | Freq: Two times a day (BID) | ORAL | Status: AC | PRN

## 2024-05-10 MED ORDER — AMIODARONE HCL 200 MG PO TABS
ORAL_TABLET | ORAL | 0 refills | Status: AC
  Filled 2024-05-10: qty 80, 30d supply, fill #0

## 2024-05-10 MED ORDER — SPIRONOLACTONE 25 MG PO TABS
25.0000 mg | ORAL_TABLET | Freq: Every day | ORAL | 0 refills | Status: DC
  Filled 2024-05-10: qty 30, 30d supply, fill #0

## 2024-05-10 MED ADMIN — Apixaban Tab 5 MG: 5 mg | ORAL | NDC 00003089431

## 2024-05-10 MED ADMIN — Insulin Aspart Inj Soln 100 Unit/ML: 2 [IU] | SUBCUTANEOUS | NDC 73070010011

## 2024-05-10 MED ADMIN — Insulin Aspart Inj Soln 100 Unit/ML: 7 [IU] | SUBCUTANEOUS | NDC 73070010011

## 2024-05-10 MED ADMIN — Guaifenesin Tab ER 12HR 600 MG: 600 mg | ORAL | NDC 68084057211

## 2024-05-10 MED ADMIN — Cyclobenzaprine HCl Tab 10 MG: 10 mg | ORAL | NDC 60687055811

## 2024-05-10 MED ADMIN — Furosemide Inj 10 MG/ML: 60 mg | INTRAVENOUS | NDC 71288020302

## 2024-05-10 MED ADMIN — Pregabalin Cap 75 MG: 75 mg | ORAL | NDC 60687049511

## 2024-05-10 MED ADMIN — Spironolactone Tab 25 MG: 25 mg | ORAL | NDC 60687046511

## 2024-05-10 MED ADMIN — Umeclidinium Br Aero Powd Breath Act 62.5 MCG/ACT (Base Eq): 1 | RESPIRATORY_TRACT | NDC 00173087306

## 2024-05-10 MED ADMIN — Atorvastatin Calcium Tab 80 MG (Base Equivalent): 80 mg | ORAL | NDC 00904629304

## 2024-05-10 MED ADMIN — Fluticasone Furoate-Vilanterol Aero Powd BA 200-25 MCG/ACT: 1 | RESPIRATORY_TRACT | NDC 00173088214

## 2024-05-10 MED ADMIN — Fenofibrate Tab 160 MG: 160 mg | ORAL | NDC 42858066045

## 2024-05-10 MED ADMIN — Sacubitril-Valsartan Tab 24-26 MG: 1 | ORAL | NDC 72205028018

## 2024-05-10 MED ADMIN — Insulin Aspart Inj Soln 100 Unit/ML: 15 [IU] | SUBCUTANEOUS | NDC 73070010011

## 2024-05-10 MED ADMIN — Famotidine Tab 20 MG: 20 mg | ORAL | NDC 00904719361

## 2024-05-10 MED ADMIN — Pantoprazole Sodium EC Tab 40 MG (Base Equiv): 40 mg | ORAL | NDC 63739056410

## 2024-05-10 MED ADMIN — Fluoxetine HCl Cap 20 MG: 40 mg | ORAL | NDC 00904578561

## 2024-05-10 MED ADMIN — Amiodarone HCl Tab 200 MG: 400 mg | ORAL | NDC 60687043711

## 2024-05-10 MED ADMIN — Dapagliflozin Propanediol Tab 10 MG (Base Equivalent): 10 mg | ORAL | NDC 00310621039

## 2024-05-10 MED ADMIN — Insulin Glargine-yfgn Inj 100 Unit/ML: 55 [IU] | SUBCUTANEOUS | NDC 83257001411

## 2024-05-10 MED ADMIN — Ipratropium-Albuterol Nebu Soln 0.5-2.5(3) MG/3ML: 3 mL | RESPIRATORY_TRACT | NDC 60687040579

## 2024-05-10 MED ADMIN — Dextromethorphan-Guaifenesin Syrup 10-100 MG/5ML: 5 mL | ORAL | NDC 00121127610

## 2024-05-10 NOTE — TOC Transition Note (Addendum)
 Transition of Care [facility]) - Discharge Note   Patient Details  Name: Zachary Deleon MRN: [id number] Date of Birth: [birth year]  Transition of Care [facility]) CM/SW Contact:  Victory Jackquline RAMAN, RN Phone Number: 05/10/2024, 1:34 PM   Clinical Narrative:  RNCM received a message via secure chat from the MD informing me that the patient will be discharged today and that we needed to order DME. Spoke to patient spouse Gaetana @ [phone], RNCM introduced role and explained that discharge planning would be discussed. S he said that she was at the patient's bedside and would be transporting the patient home.She confirmed what DME the patient needed. The patient needs the following DME: Bariatric BSC, BRW Portable Oxygen, Lift Chair, Pulse OX Machine BiPAP; Humidity Set Up. MD made aware and said that she didn't think that they would be able to get the BiPAP secondary to needing a sleep study done. Email sent to [facility] to order DME. Pt has discharge orders, no further concerns. RNCM signing off.  3:32 pm: RNCM received a call from Renee with [employer] [facility] informing me that they can't fill the BiPAP order over the weekend and that they do not have a sit to stand chair. MD made aware.  3:41 pm: RNCM received a message from the MD via secure chat saying that's fine he doesn't need a bipap at this point, needs to follow outpatient.    Final next level of care: Home w Home Health Services Barriers to Discharge: Barriers Resolved   Patient Goals and CMS Choice            Discharge Placement                Patient to be transferred to facility by: Spoouse Name of family member notified: [name] Patient and family notified of of transfer: 05/10/24  Discharge Plan and Services Additional resources added to the After Visit Summary for                  DME Arranged: 3-N-1, Walker rolling, Oxygen, Pulse oximeter, Bipap DME Agency: [facility] Date DME Agency Contacted: 05/10/24    Representative spoke with at DME Agency: Email sent to [facility]            Social Drivers of Health (SDOH) Interventions SDOH Screenings   Food Insecurity: No Food Insecurity (05/04/2024)  Housing: Low Risk  (05/04/2024)  Transportation Needs: No Transportation Needs (05/04/2024)  Utilities: Not At Risk (05/04/2024)  Financial Resource Strain: Low Risk  (05/06/2024)  Social Connections: Moderately Integrated (05/04/2024)  Tobacco Use: Medium Risk (05/03/2024)     Readmission Risk Interventions     No data to display

## 2024-05-10 NOTE — Plan of Care (Signed)

## 2024-05-10 NOTE — Progress Notes (Signed)
 [facility] CARDIOLOGY PROGRESS NOTE       Patient ID: Zachary Deleon MRN: [id number] DOB/AGE: 11/20/1965 58 y.o.  Admit date: 05/03/2024 Referring Physician Dr. Devaughn Ban Primary Physician Baldwin, Grayce Livings, NP  Primary Cardiologist None Reason for Consultation AoCHF  HPI: Zachary Deleon is a 58 y.o. male  with a past medical history of  COPD on 2 L oxygen at baseline with CHF, extensive tobacco abuse history, CAD, diabetes, carotid stenosis, hypertension, morbid obesity who presented to the ED on 05/03/2024 for SOB, cough. Treated for CHF exacerbation, initially was improving but today with worsening status. Cardiology was consulted for further evaluation.   Interval history: Patient seen and examined at bedside, resting comfortably. No events overnight. Denies chest pain, shortness of breath, palpitations, diaphoresis, syncope.  Review of systems complete and found to be negative unless listed above    Past Medical History:  Diagnosis Date   CHF (congestive heart failure) (HCC)    COPD (chronic obstructive pulmonary disease) (HCC)    Diabetes mellitus without complication (HCC)    Stroke (HCC)      Medications Prior to Admission  Medication Sig Dispense Refill Last Dose/Taking   acetaminophen  (TYLENOL ) 325 MG tablet Take 650 mg by mouth every 6 (six) hours as needed.   Past Month   albuterol  (VENTOLIN  HFA) 108 (90 Base) MCG/ACT inhaler Inhale 2 puffs into the lungs every 6 (six) hours as needed for wheezing or shortness of breath.   Past Month   amLODipine  (NORVASC ) 10 MG tablet Take 10 mg by mouth at bedtime.   Past Week   apixaban  (ELIQUIS ) 5 MG TABS tablet Take 5 mg by mouth 2 (two) times daily.   Past Week   atorvastatin  (LIPITOR ) 80 MG tablet Take 80 mg by mouth daily.   Past Week   cyclobenzaprine  (FLEXERIL ) 10 MG tablet Take 10 mg by mouth 2 (two) times daily.   Past Week   famotidine  (PEPCID ) 20 MG tablet Take 20 mg by mouth 2 (two) times daily.   Past Week    fenofibrate  160 MG tablet Take 160 mg by mouth every morning.   Past Week   FLUoxetine  (PROZAC ) 40 MG capsule Take 40 mg by mouth in the morning.   Past Week   fluticasone -salmeterol (ADVAIR DISKUS) 250-50 MCG/ACT AEPB Inhale 1 puff into the lungs in the morning and at bedtime.   Past Week   insulin  aspart (NOVOLOG ) 100 UNIT/ML injection Inject 20 Units into the skin 3 (three) times daily before meals.   Past Week   insulin  glargine, 1 Unit Dial, (TOUJEO  SOLOSTAR) 300 UNIT/ML Solostar Pen Inject 65 Units into the skin daily.   Past Week   linagliptin (TRADJENTA) 5 MG TABS tablet Take 5 mg by mouth in the morning.   Past Week   loratadine (CLARITIN) 10 MG tablet Take 10 mg by mouth in the morning.   Past Week   losartan (COZAAR) 50 MG tablet Take 75 mg by mouth in the morning.   Past Week   metFORMIN (GLUCOPHAGE) 500 MG tablet Take 1,000 mg by mouth 2 (two) times daily with a meal.   Past Week   Multiple Vitamin (MULTIVITAMIN WITH MINERALS) TABS tablet Take 1 tablet by mouth in the morning.   Past Week   pantoprazole  (PROTONIX ) 40 MG tablet Take 40 mg by mouth in the morning.   Past Week   pregabalin  (LYRICA ) 75 MG capsule Take 75 mg by mouth 3 (three) times daily.   Past Week  Tiotropium Bromide  (SPIRIVA HANDIHALER) 18 MCG CAPS Place 1 capsule into inhaler and inhale in the morning.   Past Week   sennosides-docusate sodium (SENOKOT-S) 8.6-50 MG tablet Take 1 tablet by mouth daily. (Patient not taking: Reported on 05/04/2024)   Not Taking   Social History   Socioeconomic History   Marital status: Married    Spouse name: Not on file   Number of children: Not on file   Years of education: Not on file   Highest education level: Not on file  Occupational History   Not on file  Tobacco Use   Smoking status: Former    Types: Cigarettes   Smokeless tobacco: Not on file  Substance and Sexual Activity   Alcohol use: Not Currently   Drug use: Not Currently   Sexual activity: Not on file  Other  Topics Concern   Not on file  Social History Narrative   Not on file   Social Drivers of Health   Financial Resource Strain: Low Risk  (05/06/2024)   Overall Financial Resource Strain (CARDIA)    Difficulty of Paying Living Expenses: Not hard at all  Food Insecurity: No Food Insecurity (05/04/2024)   Hunger Vital Sign    Worried About Running Out of Food in the Last Year: Never true    Ran Out of Food in the Last Year: Never true  Transportation Needs: No Transportation Needs (05/04/2024)   PRAPARE - Administrator, Civil Service (Medical): No    Lack of Transportation (Non-Medical): No  Physical Activity: Not on file  Stress: Not on file  Social Connections: Moderately Integrated (05/04/2024)   Social Connection and Isolation Panel    Frequency of Communication with Friends and Family: More than three times a week    Frequency of Social Gatherings with Friends and Family: Twice a week    Attends Religious Services: 1 to 4 times per year    Active Member of [employer] or Organizations: No    Attends Banker Meetings: Patient unable to answer    Marital Status: Married  Catering Manager Violence: Not At Risk (05/04/2024)   Humiliation, Afraid, Rape, and Kick questionnaire    Fear of Current or Ex-Partner: No    Emotionally Abused: No    Physically Abused: No    Sexually Abused: No    No family history on file.   Vitals:   05/10/24 0146 05/10/24 0430 05/10/24 0500 05/10/24 0854  BP: 110/62 115/62  127/79  Pulse: 75 79  76  Resp: 17 19    Temp: 98 F (36.7 C) 98.2 F (36.8 C)  98 F (36.7 C)  TempSrc: Oral Oral    SpO2: 99% 99%  100%  Weight:   (!) 146.3 kg   Height:        PHYSICAL EXAM General: Ill appearing male, well nourished. Morbidly obese. HEENT: Normocephalic and atraumatic. Neck: No JVD.  Lungs: Increased WOB on BiPAP. Breath sounds diminished bilaterally. Heart: HRRR. Normal S1 and S2 without gallops or murmurs.  Abdomen:  Non-distended appearing.  Msk: Normal strength and tone for age. Extremities: Warm and well perfused. No clubbing, cyanosis. 1+ pitting edema bilaterally.  Neuro: Alert and oriented X 3. Psych: Answers questions appropriately.   Labs: Basic Metabolic Panel: Recent Labs    05/09/24 0440 05/10/24 0603  NA 138 137  K 4.2 4.3  CL 97* 97*  CO2 29 30  GLUCOSE 234* 212*  BUN 36* 34*  CREATININE 1.40* 1.50*  CALCIUM  9.1 9.4  MG 2.2 2.1   Liver Function Tests: No results for input(s): AST, ALT, ALKPHOS, BILITOT, PROT, ALBUMIN in the last 72 hours.  No results for input(s): LIPASE, AMYLASE in the last 72 hours.  CBC: No results for input(s): WBC, NEUTROABS, HGB, HCT, MCV, PLT in the last 72 hours.  Cardiac Enzymes: No results for input(s): CKTOTAL, CKMB, CKMBINDEX, TROPONINIHS in the last 72 hours. BNP: No results for input(s): BNP in the last 72 hours. D-Dimer: No results for input(s): DDIMER in the last 72 hours.  Hemoglobin A1C: No results for input(s): HGBA1C in the last 72 hours.  Fasting Lipid Panel: No results for input(s): CHOL, HDL, LDLCALC, TRIG, CHOLHDL, LDLDIRECT in the last 72 hours. Thyroid Function Tests: No results for input(s): TSH, T4TOTAL, T3FREE, THYROIDAB in the last 72 hours.  Invalid input(s): FREET3 Anemia Panel: No results for input(s): VITAMINB12, FOLATE, FERRITIN, TIBC, IRON, RETICCTPCT in the last 72 hours.    Radiology: [facility] Chest Port 1 View Result Date: 05/06/2024 CLINICAL DATA:  Dyspnea. EXAM: PORTABLE CHEST 1 VIEW COMPARISON:  Radiographs 05/03/2024.  No other comparison studies. FINDINGS: 1304 hours. No significant change in cardiomegaly and asymmetric elevation of the right hemidiaphragm. Mild increase in generalized interstitial prominence which may reflect worsening edema. There is no confluent airspace disease, pneumothorax or significant pleural effusion. Mild  degenerative changes in the spine without evidence of acute osseous abnormality. IMPRESSION: Cardiomegaly with possible increased mild interstitial edema. No confluent airspace disease or significant pleural effusion. Electronically Signed   By: Elsie Perone M.D.   On: 05/06/2024 14:43   ECHOCARDIOGRAM COMPLETE Result Date: 05/04/2024    ECHOCARDIOGRAM REPORT   Patient Name:   Zachary Deleon Date of Exam: 05/04/2024 Medical Rec #:  [id number]    Height:       74.0 in Accession #:    [id number]   Weight:       352.0 lb Date of Birth:  1965/07/08    BSA:          2.764 m Patient Age:    58 years     BP:           148/85 mmHg Patient Gender: M            HR:           109 bpm. Exam Location:  [facility] Procedure: 2D Echo, Cardiac Doppler, Color Doppler and Intracardiac            Opacification Agent (Both Spectral and Color Flow Doppler were            utilized during procedure). STAT ECHO Indications:     CHF I50.31  History:         Patient has no prior history of Echocardiogram examinations.  Sonographer:     Thedora Louder RDCS, FASE Referring Phys:  [id number] LORANE POLAND Diagnosing Phys: Denyse Bathe  Sonographer Comments: Technically challenging study due to limited acoustic windows, suboptimal apical window, no subcostal window and patient is obese. Image acquisition challenging due to patient body habitus and Image acquisition challenging due to respiratory motion. IMPRESSIONS  1. Left ventricular ejection fraction, by estimation, is 40 to 45%. The left ventricle has mildly decreased function. The left ventricle demonstrates global hypokinesis. The left ventricular internal cavity size was mildly to moderately dilated. There is moderate concentric left ventricular hypertrophy. Left ventricular diastolic parameters were normal.  2. Right ventricular systolic function is low normal. The right ventricular size is mildly enlarged. Mildly  increased right ventricular wall thickness.  3. Left atrial size was mild  to moderately dilated.  4. Right atrial size was mild to moderately dilated.  5. The mitral valve is grossly normal. Trivial mitral valve regurgitation.  6. The aortic valve is grossly normal. Aortic valve regurgitation is mild. FINDINGS  Left Ventricle: Left ventricular ejection fraction, by estimation, is 40 to 45%. The left ventricle has mildly decreased function. The left ventricle demonstrates global hypokinesis. Definity  contrast agent was given IV to delineate the left ventricular  endocardial borders. Strain was performed and the global longitudinal strain is indeterminate. The left ventricular internal cavity size was mildly to moderately dilated. There is moderate concentric left ventricular hypertrophy. Left ventricular diastolic parameters were normal. Right Ventricle: The right ventricular size is mildly enlarged. Mildly increased right ventricular wall thickness. Right ventricular systolic function is low normal. Left Atrium: Left atrial size was mild to moderately dilated. Right Atrium: Right atrial size was mild to moderately dilated. Pericardium: Trivial pericardial effusion is present. The pericardial effusion is circumferential. Mitral Valve: The mitral valve is grossly normal. Trivial mitral valve regurgitation. Tricuspid Valve: The tricuspid valve is grossly normal. Tricuspid valve regurgitation is trivial. Aortic Valve: The aortic valve is grossly normal. Aortic valve regurgitation is mild. Pulmonic Valve: The pulmonic valve was grossly normal. Pulmonic valve regurgitation is trivial. Aorta: The aortic root, ascending aorta and aortic arch are all structurally normal, with no evidence of dilitation or obstruction. IAS/Shunts: No atrial level shunt detected by color flow Doppler. Additional Comments: 3D was performed not requiring image post processing on an independent workstation and was indeterminate.  LEFT VENTRICLE PLAX 2D LVIDd:         6.90 cm      Diastology LVIDs:         5.50 cm       LV e' lateral:   10.90 cm/s LV PW:         1.00 cm      LV E/e' lateral: 8.3 LV IVS:        1.10 cm LVOT diam:     2.20 cm LVOT Area:     3.80 cm  LV Volumes (MOD) LV vol d, MOD A2C: 200.0 ml LV vol d, MOD A4C: 175.0 ml LV vol s, MOD A2C: 114.0 ml LV vol s, MOD A4C: 105.0 ml LV SV MOD A2C:     86.0 ml LV SV MOD A4C:     175.0 ml LV SV MOD BP:      74.5 ml LEFT ATRIUM           Index LA Vol (A4C): 59.0 ml 21.35 ml/m                        PULMONIC VALVE AORTA                 PV Vmax:        1.04 m/s Ao Root diam: 3.20 cm PV Peak grad:   4.3 mmHg Ao Asc diam:  2.90 cm RVOT Peak grad: 3 mmHg  MITRAL VALVE MV Area (PHT): 4.19 cm    SHUNTS MV Decel Time: 181 msec    Systemic Diam: 2.20 cm MV E velocity: 90.70 cm/s MV A velocity: 78.60 cm/s MV E/A ratio:  1.15 Shaukat Khan Electronically signed by Denyse Bathe Signature Date/Time: 05/04/2024/1:21:33 PM    Final    DG Chest Portable 1 View Result Date: 05/03/2024 EXAM: 1 VIEW(S) XRAY OF  THE CHEST 05/03/2024 11:19:00 PM COMPARISON: None available. CLINICAL HISTORY: shortness of breath FINDINGS: LUNGS AND PLEURA: Elevated right hemidiaphragm and low lung volumes. Mild pulmonary edema. No pleural effusion. No pneumothorax. HEART AND MEDIASTINUM: Cardiomegaly. BONES AND SOFT TISSUES: No acute osseous abnormality. IMPRESSION: 1. Mild pulmonary edema. 2. Cardiomegaly. Electronically signed by: Oneil Devonshire MD 05/03/2024 11:26 PM EST RP Workstation: [id number]    ECHO as above  TELEMETRY (personally reviewed): Sinus rhythm rate 80-90s  EKG (personally reviewed): sinus tach, PVC rate 101 bpm, repeat today with AF RVR rate 138 bpm  Data reviewed by me 05/10/2024: last 24h vitals tele labs imaging I/O ED provider note, admission H&P, hospitalist progress note, PCCM note, palliative note  Principal Problem:   CHF exacerbation (HCC) Active Problems:   Obesity, Class III, BMI 40-49.9 (morbid obesity) (HCC)   History of DVT (deep vein thrombosis)   History of  pulmonary embolus (PE)   Seizure disorder (HCC)   History of CVA (cerebrovascular accident)   T2DM (type 2 diabetes mellitus) (HCC)   COPD (chronic obstructive pulmonary disease) (HCC)   Essential hypertension   PAD (peripheral artery disease)   History of CEA (carotid endarterectomy)   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 male  with a past medical history of  COPD on 2 L oxygen at baseline with CHF, extensive tobacco abuse history, CAD, diabetes, carotid stenosis, hypertension, morbid obesity who presented to the ED on 05/03/2024 for SOB, cough. Treated for CHF exacerbation, initially was improving but today with worsening status. Cardiology was consulted for further evaluation.   # Acute on chronic HFmrEF # Atrial fibrillation RVR, new onset # Hypertension # COPD # Demand ischemia Patient initially presented for worsening shortness of breath and was treated with IV diuresis.  Respiratory status worsening today and requiring BiPAP.  Noted to have tachycardia and telemetry/EKG consistent with atrial fibrillation RVR.  Previously on anticoagulation for prior DVT/PE but no history of A-fib. - Continue amio 400 mg BID for 10 days followed by amio 200 mg BID thereafter. - Continue Eliquis  5 mg twice daily for stroke risk reduction.  If unable to take p.o. meds due to requiring BiPAP then would switch to IV anticoagulation. - D/C on Lasix  PO 40 mg BID. Patient stable for discharge home. - Continue Farxiga  10 mg daily, spironolactone  25 mg daily to help augment diuresis.  Further additions to GDMT as able.  Was on Coreg  previously but this is held due to acute decompensated heart failure. - Continue atorvastatin  80 mg daily. - Mild and flat troponin most consistent with demand/supply mismatch and not ACS in the setting of AoCHF. - I did discuss CODE STATUS with patient and family on  05/06/24.  He confirms that he is DNR/DNI - would not want CPR or intubation.  Primary team has also discussed with patient and he has stated that if he continues to decline would consider comfort measures.   Signed: Sharetta Ricchio, DO  05/10/2024, 12:14 PM [facility] Card[facility]

## 2024-05-10 NOTE — Progress Notes (Signed)
 Patient has all of his supplies delivered to his home. He is being discharged now with family by family vehicle. Discharge documentation and telemetry has been completed by AM shift.

## 2024-05-10 NOTE — Progress Notes (Signed)
 I was alerted around 6:15 pm that I needed to email [employer] re: RW and BSC. In compliance with HIPAA, I will not send emails with patient information to anyone outside our healthcare system and I informed staff of this. I completed a letter at 6:15 and instructed staff to give this to the family to send to [employer] if they prefer to do it that way. Around 7:00 pm, RN stated [employer] was on the unit. I asked to speak to them. I offered to print a Rx with the necessary DME information and language to justify the RW and BSC and I was told this was insufficient. I responded that our case mgt will have to solve this issue tomorrow as I have nothing else to offer on my end. I spoke w/ patient's family, they are free to stay here if they are not comfortable going home without the equipment, they are free to leave with the understanding that I do not have confirmation if/when this equipment will be available esp tomorrow being Sunday. Will inform case management tomorrow AM.

## 2024-05-12 ENCOUNTER — Encounter: Payer: Self-pay | Admitting: Emergency Medicine

## 2024-05-22 ENCOUNTER — Telehealth: Payer: Self-pay | Admitting: Family

## 2024-05-22 NOTE — Progress Notes (Deleted)
 [facility] Note   Referring Physician: PCP: Zachary Grayce Livings, NP Cardiologist: None   Chief Complaint:    HPI:  Zachary Deleon is a 58 y/o male with a history of HFrEF, COPD on chronic 2L O2, tobacco use, CAD, T2DM, carotid stenosis, HTN, OSA, seizure disorder, DVT, PE, stroke, PAD.   Admitted 05/03/24 with progressive dyspnea and cough over 3 weeks. On admission, proBNP was 1811, HS-troponin was 180, lactic acid was 3.9, and A1c was 10.6. Chest x-ray noted mild pulmonary edema. TTE 05/04/24 showed LVEF of 40-45%, mild to moderate LV dilation, global hypokinesis, low normal RV function. IV diuresed.   He presents today for his initial TOC HF visit with a chief complaint of    Review of Systems: [y] = yes, [ ]  = no   General: Weight gain [ ] ; Weight loss [ ] ; Anorexia [ ] ; Fatigue [ ] ; Fever [ ] ; Chills [ ] ; Weakness [ ]   Cardiac: Chest pain/pressure [ ] ; Resting SOB [ ] ; Exertional SOB [ ] ; Orthopnea [ ] ; Pedal Edema [ ] ; Palpitations [ ] ; Syncope [ ] ; Presyncope [ ] ; Paroxysmal nocturnal dyspnea[ ]   Pulmonary: Cough [ ] ; Wheezing[ ] ; Hemoptysis[ ] ; Sputum [ ] ; Snoring [ ]   GI: Vomiting[ ] ; Dysphagia[ ] ; Melena[ ] ; Hematochezia [ ] ; Heartburn[ ] ; Abdominal pain [ ] ; Constipation [ ] ; Diarrhea [ ] ; BRBPR [ ]   GU: Hematuria[ ] ; Dysuria [ ] ; Nocturia[ ]   Vascular: Pain in legs with walking [ ] ; Pain in feet with lying flat [ ] ; Non-healing sores [ ] ; Stroke [ ] ; TIA [ ] ; Slurred speech [ ] ;  Neuro: Headaches[ ] ; Vertigo[ ] ; Seizures[ ] ; Paresthesias[ ] ;Blurred vision [ ] ; Diplopia [ ] ; Vision changes [ ]   Ortho/Skin: Arthritis [ ] ; Joint pain [ ] ; Muscle pain [ ] ; Joint swelling [ ] ; Back Pain [ ] ; Rash [ ]   Psych: Depression[ ] ; Anxiety[ ]   Heme: Bleeding problems [ ] ; Clotting disorders [ ] ; Anemia [ ]   Endocrine: Diabetes [ ] ; Thyroid dysfunction[ ]    Past Medical History:  Diagnosis Date   Arthritis    Carotid stenosis    CHF (congestive heart failure)  (HCC)    COPD (chronic obstructive pulmonary disease) (HCC)    Depression    Diabetes mellitus without complication (HCC)    DVT (deep venous thrombosis) (HCC)    Hypertension    OSA (obstructive sleep apnea)    Restrictive lung disease    Seizures (HCC)    Status post carotid endarterectomy    with patch angioplasty   Stroke (HCC) 11/2017   Stroke [facility])     Current Outpatient Medications  Medication Sig Dispense Refill   ACCU-CHEK GUIDE test strip      acetaminophen  (TYLENOL ) 325 MG tablet Take 2 tablets (650 mg total) by mouth every 4 (four) hours as needed for mild pain (or temp > 37.5 C (99.5 F)).     acetaminophen  (TYLENOL ) 325 MG tablet Take 650 mg by mouth every 6 (six) hours as needed.     albuterol  (VENTOLIN  HFA) 108 (90 Base) MCG/ACT inhaler Inhale 2 puffs into the lungs every 4 (four) hours as needed for wheezing or shortness of breath. 1 Inhaler 0   albuterol  (VENTOLIN  HFA) 108 (90 Base) MCG/ACT inhaler Inhale 2 puffs into the lungs every 6 (six) hours as needed for wheezing or shortness of breath.     amiodarone  (PACERONE ) 200 MG tablet Take 2 tablets (400 mg total) by mouth 2 (two)  times daily for 10 days, THEN 1 tablet (200 mg total) 2 (two) times daily for 20 days. 80 tablet 0   amLODipine  (NORVASC ) 10 MG tablet Take 1 tablet (10 mg total) by mouth daily. 30 tablet 1   apixaban  (ELIQUIS ) 5 MG TABS tablet Take 2 tablets (10 mg total) by mouth 2 (two) times daily for 5 days. 20 tablet 0   apixaban  (ELIQUIS ) 5 MG TABS tablet Take 1 tablet (5 mg total) by mouth 2 (two) times daily. Only start taking this script after you have completed eliquis  10 mg BID x 5 days 60 tablet 0   apixaban  (ELIQUIS ) 5 MG TABS tablet Take 5 mg by mouth 2 (two) times daily.     atorvastatin  (LIPITOR ) 80 MG tablet Take 1 tablet (80 mg total) by mouth daily. 30 tablet 1   atorvastatin  (LIPITOR ) 80 MG tablet Take 80 mg by mouth daily.     benzonatate  (TESSALON ) 100 MG capsule Take 1-2 tablets 3 times a  day as needed for cough (Patient not taking: Reported on 05/04/2024) 30 capsule 0   clopidogrel  (PLAVIX ) 75 MG tablet Take 1 tablet (75 mg total) by mouth daily with breakfast. (Patient not taking: Reported on 05/04/2024) 30 tablet 1   cyclobenzaprine  (FLEXERIL ) 10 MG tablet Take 10 mg by mouth 2 (two) times daily as needed.     cyclobenzaprine  (FLEXERIL ) 10 MG tablet Take 10 mg by mouth 2 (two) times daily.     dapagliflozin  propanediol (FARXIGA ) 10 MG TABS tablet Take 1 tablet (10 mg total) by mouth daily. 30 tablet 0   diclofenac  sodium (VOLTAREN ) 1 % GEL Apply 2 g topically 4 (four) times daily. 1 Tube 1   famotidine  (PEPCID ) 20 MG tablet TAKE 1 TABLET TWICE DAILY AS NEEDED FOR HEARTBURN     famotidine  (PEPCID ) 20 MG tablet Take 20 mg by mouth 2 (two) times daily.     fenofibrate  160 MG tablet Take 1 tablet (160 mg total) by mouth daily. 30 tablet 1   fenofibrate  160 MG tablet Take 160 mg by mouth every morning.     fluconazole (DIFLUCAN) 150 MG tablet Take 150 mg by mouth daily as needed.     FLUoxetine  (PROZAC ) 40 MG capsule Take 40 mg by mouth daily.     FLUoxetine  (PROZAC ) 40 MG capsule Take 40 mg by mouth in the morning.     fluticasone  furoate-vilanterol (BREO ELLIPTA ) 200-25 MCG/ACT AEPB Inhale 1 puff into the lungs daily.     Fluticasone -Salmeterol (ADVAIR) 250-50 MCG/DOSE AEPB Inhale 1 puff into the lungs 2 (two) times daily. 1 each 1   furosemide  (LASIX ) 20 MG tablet Take 1 tablet (20 mg total) by mouth daily. (Patient not taking: Reported on 05/04/2024) 2 tablet 0   furosemide  (LASIX ) 40 MG tablet Take 1 tablet (40 mg total) by mouth 2 (two) times daily. Increase to 1 tablet (40 mg total) by mouth THREE TIMES daily (total daily dose 120 mg) as needed for up to 3 days for increased leg swelling, shortness of breath, weight gain 5+ lbs over 1-2 days. Seek medical care if these symptoms are not improving with increased dose. 90 tablet 0   guaiFENesin  (MUCINEX ) 600 MG 12 hr tablet Take  1-2 tablets (600-1,200 mg total) by mouth 2 (two) times daily as needed.     insulin  aspart (NOVOLOG ) 100 UNIT/ML FlexPen INJECT 20 UNITS UNDER THE SKIN THREE TIMES DAILY BEFORE MEALS PLUS SLIDING SCALE UP TO 80 UNITS EVERY DAY  insulin  aspart (NOVOLOG ) 100 UNIT/ML injection Inject 20 Units into the skin 3 (three) times daily before meals.     insulin  glargine, 1 Unit Dial, (TOUJEO  SOLOSTAR) 300 UNIT/ML Solostar Pen Inject 65 Units into the skin daily.     ketoconazole (NIZORAL) 2 % shampoo LATHER ON WET BEARD. LEAVE ON FOR 3-5 MINUTES AND RINSE. APPLY TWICE WEEKLY FOR 2-4 WEEKS     lidocaine  (LIDODERM ) 5 % Place 1 patch onto the skin daily. Remove & Discard patch within 12 hours or as directed by MD (Patient not taking: Reported on 05/04/2024) 30 patch 0   linagliptin (TRADJENTA) 5 MG TABS tablet Take 5 mg by mouth in the morning.     loratadine  (CLARITIN ) 10 MG tablet Take 1 tablet by mouth daily.     loratadine  (CLARITIN ) 10 MG tablet Take 10 mg by mouth in the morning.     losartan (COZAAR) 50 MG tablet Take 75 mg by mouth daily.     metFORMIN  (GLUCOPHAGE ) 500 MG tablet Take 1,000 mg by mouth 2 (two) times daily with a meal.     metFORMIN  (GLUCOPHAGE -XR) 500 MG 24 hr tablet Take 1,000 mg by mouth 2 (two) times daily.     methocarbamol  (ROBAXIN ) 500 MG tablet Take 1 tablet (500 mg total) by mouth every 8 (eight) hours as needed for muscle spasms. (Patient not taking: Reported on 05/04/2024) 60 tablet 0   Multiple Vitamin (MULTIVITAMIN WITH MINERALS) TABS tablet Take 1 tablet by mouth in the morning.     nicotine  (NICODERM CQ  - DOSED IN MG/24 HOURS) 14 mg/24hr patch 14 mg patch daily x3 weeks then 7 mg patch daily x3 weeks and stop (Patient not taking: Reported on 05/04/2024) 28 patch 0   nystatin (MYCOSTATIN/NYSTOP) powder Apply 1 Application topically 3 (three) times daily.     oxyCODONE -acetaminophen  (PERCOCET/ROXICET) 5-325 MG tablet Take 1 tablet by mouth every 4 (four) hours as needed for  severe pain. (Patient not taking: Reported on 05/04/2024) 20 tablet 0   pantoprazole  (PROTONIX ) 40 MG tablet Take 40 mg by mouth daily.     pantoprazole  (PROTONIX ) 40 MG tablet Take 40 mg by mouth in the morning.     pregabalin  (LYRICA ) 75 MG capsule Take 75 mg by mouth 2 (two) times daily.     pregabalin  (LYRICA ) 75 MG capsule Take 75 mg by mouth 3 (three) times daily.     QUEtiapine  (SEROQUEL ) 25 MG tablet Take 0.5 tablets (12.5 mg total) by mouth 2 (two) times daily as needed (agitation). (Patient not taking: Reported on 05/04/2024) 30 tablet 0   sacubitril -valsartan  (ENTRESTO ) 24-26 MG Take 1 tablet by mouth 2 (two) times daily. 60 tablet 0   senna-docusate (SENOKOT-S) 8.6-50 MG tablet Take 1 tablet by mouth at bedtime.     sennosides-docusate sodium  (SENOKOT-S) 8.6-50 MG tablet Take 2 tablets by mouth daily as needed for constipation.     spironolactone  (ALDACTONE ) 25 MG tablet Take 1 tablet (25 mg total) by mouth daily. 30 tablet 0   tamsulosin  (FLOMAX ) 0.4 MG CAPS capsule Take 1 capsule by mouth daily. (Patient not taking: Reported on 05/04/2024)     tiotropium (SPIRIVA) 18 MCG inhalation capsule Place 18 mcg into inhaler and inhale daily.     topiramate  (TOPAMAX ) 25 MG tablet Take 1 tablet (25 mg total) by mouth 2 (two) times daily. (Patient not taking: Reported on 05/04/2024) 60 tablet 1   TOUJEO  SOLOSTAR 300 UNIT/ML Solostar Pen Inject 65 Units into the skin daily at 6 (  six) AM.     traMADol  (ULTRAM ) 50 MG tablet Take 0.5-1 tablets (25-50 mg total) by mouth every 6 (six) hours as needed for moderate pain or severe pain. 30 tablet 0   umeclidinium bromide  (INCRUSE ELLIPTA ) 62.5 MCG/ACT AEPB Inhale 1 puff into the lungs daily. CAN TAKE HOME HOSPITAL DEVICES - REFILL OUTPATIENT     No current facility-administered medications for this visit.    Allergies[1]    Social History   Socioeconomic History   Marital status: Married    Spouse name: Not on file   Number of children: Not on  file   Years of education: Not on file   Highest education level: Not on file  Occupational History   Not on file  Tobacco Use   Smoking status: Former    Types: Cigarettes   Smokeless tobacco: Never  Substance and Sexual Activity   Alcohol use: Not Currently   Drug use: Not Currently   Sexual activity: Not on file  Other Topics Concern   Not on file  Social History Narrative   ** Merged History Encounter **       Social Drivers of Health   Tobacco Use: Low Risk  (05/22/2024)   Received from [facility]   Patient History    Smoking Tobacco Use: Never    Smokeless Tobacco Use: Never    Passive Exposure: Not on file  Recent Concern: Tobacco Use - Medium Risk (05/12/2024)   Patient History    Smoking Tobacco Use: Former    Smokeless Tobacco Use: Never    Passive Exposure: Not on Actuary Strain: Low Risk (05/06/2024)   Overall Financial Resource Strain (CARDIA)    Difficulty of Paying Living Expenses: Not hard at all  Food Insecurity: No Food Insecurity (05/04/2024)   Epic    Worried About Radiation Protection Practitioner of Food in the Last Year: Never true    Ran Out of Food in the Last Year: Never true  Transportation Needs: No Transportation Needs (05/04/2024)   Epic    Lack of Transportation (Medical): No    Lack of Transportation (Non-Medical): No  Physical Activity: Not on file  Stress: Not on file  Social Connections: Moderately Integrated (05/04/2024)   Social Connection and Isolation Panel    Frequency of Communication with Friends and Family: More than three times a week    Frequency of Social Gatherings with Friends and Family: Twice a week    Attends Religious Services: 1 to 4 times per year    Active Member of [employer] or Organizations: No    Attends Banker Meetings: Patient unable to answer    Marital Status: Married  Catering Manager Violence: Not At Risk (05/04/2024)   Epic    Fear of Current or Ex-Partner: No    Emotionally  Abused: No    Physically Abused: No    Sexually Abused: No  Depression (PHQ2-9): Not on file  Alcohol Screen: Not on file  Housing: Unknown (05/22/2024)   Received [facility]   Epic    Unable to Pay for Housing in the Last Year: Not on file    Number of Times Moved in the Last Year: Not on file    At any time in the past 12 months, were you homeless or living in a shelter (including now)?: No  Utilities: Not At Risk (05/04/2024)   Epic    Threatened with loss of utilities: No  Health Literacy: Not  on file      Family History  Problem Relation Age of Onset   Diabetes Mellitus II Father    Hypertension Father    Hypertension Brother        PHYSICAL EXAM: General:  Well appearing. No respiratory difficulty HEENT: normal Neck: supple. no JVD. Carotids 2+ bilat; no bruits. No lymphadenopathy or thyromegaly appreciated. Cor: PMI nondisplaced. Regular rate & rhythm. No rubs, gallops or murmurs. Lungs: clear Abdomen: soft, nontender, nondistended. No hepatosplenomegaly. No bruits or masses. Good bowel sounds. Extremities: no cyanosis, clubbing, rash, edema Neuro: alert & oriented x 3, cranial nerves grossly intact. moves all 4 extremities w/o difficulty. Affect pleasant.  ECG:   ASSESSMENT & PLAN:  1: HFrEF- - suspect due to - NYHA class - euvolemic - weighing daily - TTE 05/04/24 showed LVEF of 40-45%, mild to moderate LV dilation, global hypokinesis, low normal RV function. - continue  - BNP  2: HTN- - BP   Ellouise DELENA Class, FNP 05/22/2024     [1]  Allergies Allergen Reactions   Morphine And Codeine Other (See Comments)    Makes pt hyper   Morphine And Codeine    Sulfa Antibiotics Hives   Trulicity [Dulaglutide] Nausea And Vomiting

## 2024-05-22 NOTE — Telephone Encounter (Signed)
 Called to confirm/remind patient of their appointment at the [facility] on 05/23/24.   Appointment:   [] Confirmed  [x] Left mess   [] No answer/No voice mail  [] VM Full/unable to leave message  [] Phone not in service  Patient reminded to bring all medications and/or complete list.  Confirmed patient has transportation. Gave directions, instructed to utilize valet parking.

## 2024-05-23 ENCOUNTER — Ambulatory Visit: Admitting: Family

## 2024-05-23 ENCOUNTER — Telehealth: Payer: Self-pay | Admitting: Family

## 2024-05-23 NOTE — Telephone Encounter (Signed)
 Patient did not show for his initial TOC HF visit on 05/23/24.

## 2024-06-03 ENCOUNTER — Other Ambulatory Visit: Payer: Self-pay

## 2024-07-01 ENCOUNTER — Emergency Department

## 2024-07-01 ENCOUNTER — Other Ambulatory Visit: Payer: Self-pay

## 2024-07-01 ENCOUNTER — Observation Stay
Admission: EM | Admit: 2024-07-01 | Discharge: 2024-07-02 | Disposition: A | Discharge status: Home / Self Care | Attending: Internal Medicine | Admitting: Internal Medicine

## 2024-07-01 DIAGNOSIS — S2242XA Multiple fractures of ribs, left side, initial encounter for closed fracture: Secondary | ICD-10-CM | POA: Diagnosis not present

## 2024-07-01 DIAGNOSIS — G4733 Obstructive sleep apnea (adult) (pediatric): Secondary | ICD-10-CM | POA: Diagnosis not present

## 2024-07-01 DIAGNOSIS — E871 Hypo-osmolality and hyponatremia: Secondary | ICD-10-CM | POA: Insufficient documentation

## 2024-07-01 DIAGNOSIS — I10 Essential (primary) hypertension: Secondary | ICD-10-CM | POA: Diagnosis not present

## 2024-07-01 DIAGNOSIS — Z87891 Personal history of nicotine dependence: Secondary | ICD-10-CM | POA: Insufficient documentation

## 2024-07-01 DIAGNOSIS — N1831 Chronic kidney disease, stage 3a: Secondary | ICD-10-CM | POA: Insufficient documentation

## 2024-07-01 DIAGNOSIS — I5022 Chronic systolic (congestive) heart failure: Secondary | ICD-10-CM | POA: Diagnosis not present

## 2024-07-01 DIAGNOSIS — I130 Hypertensive heart and chronic kidney disease with heart failure and stage 1 through stage 4 chronic kidney disease, or unspecified chronic kidney disease: Secondary | ICD-10-CM | POA: Diagnosis not present

## 2024-07-01 DIAGNOSIS — Y92009 Unspecified place in unspecified non-institutional (private) residence as the place of occurrence of the external cause: Secondary | ICD-10-CM | POA: Diagnosis not present

## 2024-07-01 DIAGNOSIS — Z8673 Personal history of transient ischemic attack (TIA), and cerebral infarction without residual deficits: Secondary | ICD-10-CM | POA: Insufficient documentation

## 2024-07-01 DIAGNOSIS — J439 Emphysema, unspecified: Secondary | ICD-10-CM | POA: Insufficient documentation

## 2024-07-01 DIAGNOSIS — D696 Thrombocytopenia, unspecified: Secondary | ICD-10-CM | POA: Insufficient documentation

## 2024-07-01 DIAGNOSIS — I5020 Unspecified systolic (congestive) heart failure: Secondary | ICD-10-CM | POA: Insufficient documentation

## 2024-07-01 DIAGNOSIS — R52 Pain, unspecified: Secondary | ICD-10-CM | POA: Diagnosis present

## 2024-07-01 DIAGNOSIS — I700 Atherosclerosis of aorta: Secondary | ICD-10-CM | POA: Insufficient documentation

## 2024-07-01 DIAGNOSIS — Z79899 Other long term (current) drug therapy: Secondary | ICD-10-CM | POA: Insufficient documentation

## 2024-07-01 DIAGNOSIS — Z6841 Body Mass Index (BMI) 40.0 and over, adult: Secondary | ICD-10-CM | POA: Insufficient documentation

## 2024-07-01 DIAGNOSIS — W19XXXA Unspecified fall, initial encounter: Principal | ICD-10-CM | POA: Insufficient documentation

## 2024-07-01 DIAGNOSIS — Z9889 Other specified postprocedural states: Secondary | ICD-10-CM | POA: Insufficient documentation

## 2024-07-01 DIAGNOSIS — J9611 Chronic respiratory failure with hypoxia: Secondary | ICD-10-CM | POA: Diagnosis not present

## 2024-07-01 DIAGNOSIS — Z7901 Long term (current) use of anticoagulants: Secondary | ICD-10-CM | POA: Insufficient documentation

## 2024-07-01 DIAGNOSIS — S2232XA Fracture of one rib, left side, initial encounter for closed fracture: Secondary | ICD-10-CM | POA: Insufficient documentation

## 2024-07-01 DIAGNOSIS — Z91040 Latex allergy status: Secondary | ICD-10-CM | POA: Insufficient documentation

## 2024-07-01 DIAGNOSIS — E1122 Type 2 diabetes mellitus with diabetic chronic kidney disease: Secondary | ICD-10-CM | POA: Insufficient documentation

## 2024-07-01 DIAGNOSIS — J449 Chronic obstructive pulmonary disease, unspecified: Secondary | ICD-10-CM | POA: Diagnosis not present

## 2024-07-01 DIAGNOSIS — E66813 Obesity, class 3: Secondary | ICD-10-CM | POA: Insufficient documentation

## 2024-07-01 DIAGNOSIS — E119 Type 2 diabetes mellitus without complications: Secondary | ICD-10-CM

## 2024-07-01 LAB — BASIC METABOLIC PANEL WITH GFR
Anion gap: 16 — ABNORMAL HIGH (ref 5–15)
BUN: 27 mg/dL — ABNORMAL HIGH (ref 6–20)
CO2: 21 mmol/L — ABNORMAL LOW (ref 22–32)
Calcium: 9 mg/dL (ref 8.9–10.3)
Chloride: 95 mmol/L — ABNORMAL LOW (ref 98–111)
Creatinine, Ser: 1.84 mg/dL — ABNORMAL HIGH (ref 0.61–1.24)
GFR, Estimated: 42 mL/min — ABNORMAL LOW
Glucose, Bld: 191 mg/dL — ABNORMAL HIGH (ref 70–99)
Potassium: 4.5 mmol/L (ref 3.5–5.1)
Sodium: 133 mmol/L — ABNORMAL LOW (ref 135–145)

## 2024-07-01 LAB — CBC WITH DIFFERENTIAL/PLATELET
Abs Immature Granulocytes: 0.1 K/uL — ABNORMAL HIGH (ref 0.00–0.07)
Basophils Absolute: 0.1 K/uL (ref 0.0–0.1)
Basophils Relative: 0 %
Eosinophils Absolute: 0 K/uL (ref 0.0–0.5)
Eosinophils Relative: 0 %
HCT: 29.8 % — ABNORMAL LOW (ref 39.0–52.0)
Hemoglobin: 9.2 g/dL — ABNORMAL LOW (ref 13.0–17.0)
Immature Granulocytes: 1 %
Lymphocytes Relative: 7 %
Lymphs Abs: 0.9 K/uL (ref 0.7–4.0)
MCH: 24.4 pg — ABNORMAL LOW (ref 26.0–34.0)
MCHC: 30.9 g/dL (ref 30.0–36.0)
MCV: 79 fL — ABNORMAL LOW (ref 80.0–100.0)
Monocytes Absolute: 1.2 K/uL — ABNORMAL HIGH (ref 0.1–1.0)
Monocytes Relative: 9 %
Neutro Abs: 10.5 K/uL — ABNORMAL HIGH (ref 1.7–7.7)
Neutrophils Relative %: 83 %
Platelets: 116 K/uL — ABNORMAL LOW (ref 150–400)
RBC: 3.77 MIL/uL — ABNORMAL LOW (ref 4.22–5.81)
RDW: 17.1 % — ABNORMAL HIGH (ref 11.5–15.5)
WBC: 12.7 K/uL — ABNORMAL HIGH (ref 4.0–10.5)
nRBC: 0 % (ref 0.0–0.2)

## 2024-07-01 LAB — CBG MONITORING, ED
Glucose-Capillary: 187 mg/dL — ABNORMAL HIGH (ref 70–99)
Glucose-Capillary: 216 mg/dL — ABNORMAL HIGH (ref 70–99)

## 2024-07-01 LAB — URINALYSIS, ROUTINE W REFLEX MICROSCOPIC
Bacteria, UA: NONE SEEN
Bilirubin Urine: NEGATIVE
Glucose, UA: >=500 mg/dL — AB
Hgb urine dipstick: NEGATIVE
Ketones, ur: NEGATIVE mg/dL
Leukocytes,Ua: NEGATIVE
Nitrite: NEGATIVE
Protein, ur: 30 mg/dL — AB
Specific Gravity, Urine: 1.02 (ref 1.005–1.030)
pH: 6 (ref 5.0–8.0)

## 2024-07-01 LAB — PRO BRAIN NATRIURETIC PEPTIDE: Pro Brain Natriuretic Peptide: 583 pg/mL — ABNORMAL HIGH

## 2024-07-01 LAB — TROPONIN T, HIGH SENSITIVITY: Troponin T High Sensitivity: 121 ng/L (ref 0–19)

## 2024-07-01 MED ORDER — PANTOPRAZOLE SODIUM 40 MG PO TBEC
40.0000 mg | DELAYED_RELEASE_TABLET | Freq: Two times a day (BID) | ORAL | Status: DC
  Administered 2024-07-01 – 2024-07-02 (×2): 40 mg via ORAL
  Filled 2024-07-01 (×2): qty 1

## 2024-07-01 MED ORDER — INSULIN ASPART 100 UNIT/ML IJ SOLN
15.0000 [IU] | Freq: Three times a day (TID) | INTRAMUSCULAR | Status: DC
  Administered 2024-07-01 – 2024-07-02 (×3): 15 [IU] via SUBCUTANEOUS
  Filled 2024-07-01 (×3): qty 15

## 2024-07-01 MED ORDER — FENOFIBRATE 160 MG PO TABS
160.0000 mg | ORAL_TABLET | Freq: Every morning | ORAL | Status: DC
  Administered 2024-07-02: 160 mg via ORAL
  Filled 2024-07-01: qty 1

## 2024-07-01 MED ORDER — ONDANSETRON HCL 4 MG/2ML IJ SOLN: 4.0000 mg | Freq: Four times a day (QID) | INTRAMUSCULAR | Status: DC | PRN

## 2024-07-01 MED ORDER — INSULIN ASPART 100 UNIT/ML IJ SOLN
0.0000 [IU] | Freq: Every day | INTRAMUSCULAR | Status: DC
  Administered 2024-07-01: 2 [IU] via SUBCUTANEOUS
  Filled 2024-07-01: qty 2

## 2024-07-01 MED ORDER — PREGABALIN 50 MG PO CAPS
75.0000 mg | ORAL_CAPSULE | Freq: Three times a day (TID) | ORAL | Status: DC
  Administered 2024-07-01 (×2): 75 mg via ORAL
  Filled 2024-07-01 (×2): qty 1

## 2024-07-01 MED ORDER — INSULIN GLARGINE 100 UNIT/ML ~~LOC~~ SOLN
50.0000 [IU] | Freq: Every day | SUBCUTANEOUS | Status: DC
  Administered 2024-07-02: 50 [IU] via SUBCUTANEOUS
  Filled 2024-07-01: qty 0.5

## 2024-07-01 MED ORDER — AMIODARONE HCL 200 MG PO TABS
200.0000 mg | ORAL_TABLET | Freq: Two times a day (BID) | ORAL | Status: DC
  Administered 2024-07-01 – 2024-07-02 (×3): 200 mg via ORAL
  Filled 2024-07-01 (×3): qty 1

## 2024-07-01 MED ORDER — SODIUM CHLORIDE 0.9 % IV BOLUS
1000.0000 mL | Freq: Once | INTRAVENOUS | Status: AC
  Administered 2024-07-01: 1000 mL via INTRAVENOUS

## 2024-07-01 MED ORDER — INSULIN ASPART 100 UNIT/ML IJ SOLN
0.0000 [IU] | Freq: Three times a day (TID) | INTRAMUSCULAR | Status: DC
  Administered 2024-07-01: 2 [IU] via SUBCUTANEOUS
  Administered 2024-07-02: 3 [IU] via SUBCUTANEOUS
  Administered 2024-07-02: 2 [IU] via SUBCUTANEOUS
  Filled 2024-07-01: qty 2
  Filled 2024-07-01: qty 3
  Filled 2024-07-01: qty 2

## 2024-07-01 MED ORDER — FUROSEMIDE 40 MG PO TABS
40.0000 mg | ORAL_TABLET | Freq: Every day | ORAL | Status: DC
  Administered 2024-07-01 – 2024-07-02 (×2): 40 mg via ORAL
  Filled 2024-07-01 (×2): qty 1

## 2024-07-01 MED ORDER — ONDANSETRON HCL 4 MG PO TABS: 4.0000 mg | ORAL_TABLET | Freq: Four times a day (QID) | ORAL | Status: DC | PRN

## 2024-07-01 MED ORDER — FENTANYL CITRATE (PF) 50 MCG/ML IJ SOSY
50.0000 ug | PREFILLED_SYRINGE | INTRAMUSCULAR | Status: DC | PRN
  Administered 2024-07-01 – 2024-07-02 (×5): 50 ug via INTRAVENOUS
  Filled 2024-07-01 (×5): qty 1

## 2024-07-01 MED ORDER — POLYETHYLENE GLYCOL 3350 17 G PO PACK: 17.0000 g | PACK | Freq: Every day | ORAL | Status: DC | PRN

## 2024-07-01 MED ORDER — AMLODIPINE BESYLATE 5 MG PO TABS
10.0000 mg | ORAL_TABLET | Freq: Every day | ORAL | Status: DC
  Administered 2024-07-01 – 2024-07-02 (×2): 10 mg via ORAL
  Filled 2024-07-01 (×2): qty 2

## 2024-07-01 MED ORDER — FENTANYL CITRATE (PF) 50 MCG/ML IJ SOSY
50.0000 ug | PREFILLED_SYRINGE | Freq: Once | INTRAMUSCULAR | Status: AC
  Administered 2024-07-01: 50 ug via INTRAVENOUS
  Filled 2024-07-01: qty 1

## 2024-07-01 MED ORDER — ATORVASTATIN CALCIUM 20 MG PO TABS
40.0000 mg | ORAL_TABLET | Freq: Every day | ORAL | Status: DC
  Administered 2024-07-01: 40 mg via ORAL
  Filled 2024-07-01: qty 2

## 2024-07-01 MED ORDER — ACETAMINOPHEN 325 MG PO TABS: 650.0000 mg | ORAL_TABLET | Freq: Four times a day (QID) | ORAL | Status: DC | PRN

## 2024-07-01 MED ORDER — FUROSEMIDE 40 MG PO TABS: 40.0000 mg | ORAL_TABLET | Freq: Two times a day (BID) | ORAL | Status: DC

## 2024-07-01 MED ORDER — ACETAMINOPHEN 650 MG RE SUPP: 650.0000 mg | Freq: Four times a day (QID) | RECTAL | Status: DC | PRN

## 2024-07-01 MED ORDER — OXYCODONE HCL 5 MG PO TABS: 5.0000 mg | ORAL_TABLET | ORAL | Status: DC | PRN

## 2024-07-01 MED ORDER — SACUBITRIL-VALSARTAN 24-26 MG PO TABS
1.0000 | ORAL_TABLET | Freq: Two times a day (BID) | ORAL | Status: DC
  Administered 2024-07-01 – 2024-07-02 (×2): 1 via ORAL
  Filled 2024-07-01 (×2): qty 1

## 2024-07-01 MED ORDER — APIXABAN 5 MG PO TABS
5.0000 mg | ORAL_TABLET | Freq: Two times a day (BID) | ORAL | Status: DC
  Administered 2024-07-01 (×2): 5 mg via ORAL
  Filled 2024-07-01 (×2): qty 1

## 2024-07-01 MED ORDER — FLUTICASONE FUROATE-VILANTEROL 200-25 MCG/ACT IN AEPB
1.0000 | INHALATION_SPRAY | Freq: Every day | RESPIRATORY_TRACT | Status: DC
  Administered 2024-07-01 – 2024-07-02 (×2): 1 via RESPIRATORY_TRACT
  Filled 2024-07-01: qty 28

## 2024-07-01 MED ORDER — FLUOXETINE HCL 20 MG PO CAPS
40.0000 mg | ORAL_CAPSULE | Freq: Every morning | ORAL | Status: DC
  Administered 2024-07-02: 40 mg via ORAL
  Filled 2024-07-01: qty 2

## 2024-07-01 MED ORDER — ENOXAPARIN SODIUM 80 MG/0.8ML IJ SOSY: 70.0000 mg | PREFILLED_SYRINGE | INTRAMUSCULAR | Status: DC

## 2024-07-01 MED ORDER — ALBUTEROL SULFATE (2.5 MG/3ML) 0.083% IN NEBU: 2.5000 mg | INHALATION_SOLUTION | Freq: Four times a day (QID) | RESPIRATORY_TRACT | Status: DC | PRN

## 2024-07-01 NOTE — ED Provider Notes (Signed)
 "  [facility] Provider Note    Event Date/Time   First MD Initiated Contact with Patient 07/01/24 0827     (approximate)   History   Fall   HPI  [facility] Zachary Deleon. is a 59 y.o. male who presents to the emergency department today after a fall.  The patient was going to the bathroom when he fell hitting the left side of his body against furniture.  He does think that he might have tripped.  He is complaining primarily of pain to his lower back as well as his left chest.  He does not think he hit his head although family is concerned that he did. He does state that he has been weak over the past few days. Family notes that he has had some increased breathing difficulty and cough. No chest pain. No fevers.      Physical Exam   Triage Vital Signs: ED Triage Vitals  Encounter Vitals Group     BP 07/01/24 0841 (!) 135/57     Girls Systolic BP Percentile --      Girls Diastolic BP Percentile --      Boys Systolic BP Percentile --      Boys Diastolic BP Percentile --      Pulse Rate 07/01/24 0841 85     Resp 07/01/24 0841 20     Temp 07/01/24 0841 98.6 F (37 C)     Temp Source 07/01/24 0841 Oral     SpO2 07/01/24 0841 94 %     Weight 07/01/24 0844 (!) 307 lb (139.3 kg)     Height 07/01/24 0844 6' 1 (1.854 m)     Head Circumference --      Peak Flow --      Pain Score 07/01/24 0844 10     Pain Loc --      Pain Education --      Exclude from Growth Chart --     Most recent vital signs: Vitals:   07/01/24 0841  BP: (!) 135/57  Pulse: 85  Resp: 20  Temp: 98.6 F (37 C)  SpO2: 94%   General: Awake, alert, oriented. CV:  Good peripheral perfusion. Regular rate and rhythm. Resp:  Normal respiratory effort. Some expiratory wheezing. Abd:  No distention. Non tender. Other:  Abrasion to left chest wall. Bruising to periumbilical area.   ED Results / Procedures / Treatments   Labs (all labs ordered are listed, but only abnormal results  are displayed) Labs Reviewed  CBC WITH DIFFERENTIAL/PLATELET - Abnormal; Notable for the following components:      Result Value   WBC 12.7 (*)    RBC 3.77 (*)    Hemoglobin 9.2 (*)    HCT 29.8 (*)    MCV 79.0 (*)    MCH 24.4 (*)    RDW 17.1 (*)    Platelets 116 (*)    Neutro Abs 10.5 (*)    Monocytes Absolute 1.2 (*)    Abs Immature Granulocytes 0.10 (*)    All other components within normal limits  BASIC METABOLIC PANEL WITH GFR - Abnormal; Notable for the following components:   Sodium 133 (*)    Chloride 95 (*)    CO2 21 (*)    Glucose, Bld 191 (*)    BUN 27 (*)    Creatinine, Ser 1.84 (*)    GFR, Estimated 42 (*)    Anion gap 16 (*)    All other components within  normal limits  URINALYSIS, ROUTINE W REFLEX MICROSCOPIC - Abnormal; Notable for the following components:   Color, Urine YELLOW (*)    APPearance CLEAR (*)    Glucose, UA >=500 (*)    Protein, ur 30 (*)    All other components within normal limits  PRO BRAIN NATRIURETIC PEPTIDE - Abnormal; Notable for the following components:   Pro Brain Natriuretic Peptide 583.0 (*)    All other components within normal limits  COMPREHENSIVE METABOLIC PANEL WITH GFR - Abnormal; Notable for the following components:   Sodium 132 (*)    Glucose, Bld 188 (*)    BUN 28 (*)    Creatinine, Ser 1.69 (*)    Calcium  8.4 (*)    GFR, Estimated 46 (*)    All other components within normal limits  CBC - Abnormal; Notable for the following components:   RBC 3.33 (*)    Hemoglobin 8.3 (*)    HCT 26.5 (*)    MCV 79.6 (*)    MCH 24.9 (*)    RDW 17.1 (*)    Platelets 102 (*)    All other components within normal limits  CBG MONITORING, ED - Abnormal; Notable for the following components:   Glucose-Capillary 187 (*)    All other components within normal limits  CBG MONITORING, ED - Abnormal; Notable for the following components:   Glucose-Capillary 216 (*)    All other components within normal limits  CBG MONITORING, ED -  Abnormal; Notable for the following components:   Glucose-Capillary 164 (*)    All other components within normal limits  CBG MONITORING, ED - Abnormal; Notable for the following components:   Glucose-Capillary 211 (*)    All other components within normal limits  TROPONIN T, HIGH SENSITIVITY - Abnormal; Notable for the following components:   Troponin T High Sensitivity 121 (*)    All other components within normal limits     EKG  I, Guadalupe Eagles, attending physician, personally viewed and interpreted this EKG  EKG Time: 0926 Rate: 85 Rhythm: sinus rhythm with PVC Axis: no st elevation Intervals: qtc 442 QRS: q waves III, aVF ST changes: no st elevation Impression: abnormal ekg  RADIOLOGY I independently interpreted and visualized the CT head. My interpretation: No ICH Radiology interpretation:  IMPRESSION:  1. No acute intracranial abnormality or acute traumatic injury identified.  2. Chronic right MCA infarct and post operative changes from suboccipital  craniotomy. 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    I independently interpreted and visualized the CT cervical spine. My interpretation: No fracture Radiology interpretation:  IMPRESSION:  1. No acute traumatic injury identified in the cervical spine.  2. Degenerative left C2-C3 facet ankylosis.  3. Emphysema (ICD10-J43.9).   I independently interpreted and visualized the CT chest/abd/pel. My interpretation: Left sided rib fractures Radiology interpretation: IMPRESSION:  1. Acute fractures of the left 9th, 10th and 11th ribs as described.  No evidence of pneumothorax or hemothorax.  2. No other acute posttraumatic findings identified in the chest,  abdomen or pelvis.  3. Hepatosplenomegaly with probable hepatic steatosis.  4. Cholelithiasis without evidence of cholecystitis or biliary  ductal dilatation.  5. Aortic  Atherosclerosis (ICD10-I70.0) and Emphysema (ICD10-J43.9).       PROCEDURES:  Critical Care performed: No    MEDICATIONS ORDERED IN ED: Medications  fentaNYL  (SUBLIMAZE ) injection 50 mcg (has no administration in time range)     IMPRESSION /  MDM / ASSESSMENT AND PLAN / ED COURSE  I reviewed the triage vital signs and the nursing notes.                              Differential diagnosis includes, but is not limited to, ICH, fracture, contusion, solid organ injury  Patient's presentation is most consistent with acute presentation with potential threat to life or bodily function.   Patient presents to the emergency department today after a fall.  Complaining primarily of left sided pain.  Did obtain imaging which is consistent with 3 rib fractures on the left side.  CT head and cervical spine without acute abnormalities.  At this time given multiple rib fractures will plan on admission for pain control and pulmonary toilet.     FINAL CLINICAL IMPRESSION(S) / ED DIAGNOSES   Final diagnoses:  Closed fracture of multiple ribs of left side, initial encounter       Note:  This document was prepared using Dragon voice recognition software and may include unintentional dictation errors.    Floy Roberts, MD 07/02/24 [phone]  "

## 2024-07-01 NOTE — ED Triage Notes (Signed)
 BIBEMS from home. Pt states they tripped over their feet this morning and fell. They hit their wooden bed frame w/ the left side of their body during the fall. Pt denies hitting head, endorses blood thinner use. Pt states they have 8/10 pain on L side of body. Hx of stroke, L sided deficits from it. Pt also states they have been weaker than usual for about 4 days. Hx of CHF, COPD, Diabetes. 3-4 L [place] baseline.   EMS VS: 140/90 92 HR 95 % SpO2

## 2024-07-01 NOTE — ED Notes (Signed)
 CCMD called to admit patient to monitor

## 2024-07-01 NOTE — ED Notes (Addendum)
 IV start attempted twice by this RN, unsuccessful. IV team consult order placed.

## 2024-07-01 NOTE — ED Notes (Signed)
 Patient cleaned up due to male purewick leaking. Peri care provided and new purewick put in place. Patient's brief and paper pads changed. Patient placed in a comfortable position with call light in reach. Fall bundle put in place.

## 2024-07-01 NOTE — H&P (Addendum)
 "  History and Physical    [facility]. FMW:[id number] DOB: January 02, 1966 DOA: 07/01/2024  DOS: the patient was seen and examined on 07/01/2024  PCP: [name], NP   Patient coming from: Home  I have personally briefly reviewed patient's old medical records in [facility]  Chief Complaint: Fall at home this morning  HPI: [name]. is a pleasant 59 y.o. male with medical history significant for arthritis, CHF, COPD on home oxygen, diabetes, HTN, HLD, depression, history of stroke who came into ED at [facility] after a fall at home this morning.  Patient stated that he usually walks with a cane.  Family thought he will need help, that is why the doctor can always.  His wife was sleeping in the same bed with him.  He thought he would not want to wake them up and tried to get out of the bed to the bathroom.  He fell out of the bed.  He denies any dizziness or chest pain or any palpitations before a fall.  He denied any fever, chills, nausea, vomiting, chest pain, shortness of breath, palpitations, hematuria, dysuria.  ED Course: Upon arrival to the ED, patient is found to be mild AKI with creatinine of 1.84 and BUN 27, prior creatinine was 1.5, slightly elevated troponin 121 but less than previous one, mild leukocytosis at 12.7, not able to ambulate due to the pain.  Urine analysis was negative for UTI, CT scan of the head and neck did not show any fractures or new stroke.  CT scan of the abdomen pelvis showed left 9th, 10th and 11th rib fractures.  No evidence of pneumothorax or hemothorax.  Hospitalist service was consulted for evaluation for admission for pain control and fall. Review of Systems:  ROS  All other systems negative except as noted in the HPI.  Past Medical History:  Diagnosis Date   Arthritis    Carotid stenosis    CHF (congestive heart failure) (HCC)    COPD (chronic obstructive pulmonary disease) (HCC)    Depression    Diabetes  mellitus without complication (HCC)    DVT (deep venous thrombosis) (HCC)    Hypertension    OSA (obstructive sleep apnea)    Restrictive lung disease    Seizures (HCC)    Status post carotid endarterectomy    with patch angioplasty   Stroke (HCC) 11/2017   Stroke [facility])     Past Surgical History:  Procedure Laterality Date   BRAIN SURGERY     BRAIN TUMOR EXCISION     performed at [facility]   CATARACT EXTRACTION       reports that he has quit smoking. His smoking use included cigarettes. He has never used smokeless tobacco. He reports that he does not currently use alcohol. He reports that he does not currently use drugs.  Allergies[1]  Family History  Problem Relation Age of Onset   Diabetes Mellitus II Father    Hypertension Father    Hypertension Brother     Prior to Admission medications  Medication Sig Start Date End Date Taking? Authorizing Provider  ACCU-CHEK GUIDE test strip  02/18/22   [provider]  acetaminophen  (TYLENOL ) 325 MG tablet Take 2 tablets (650 mg total) by mouth every 4 (four) hours as needed for mild pain (or temp > 37.5 C (99.5 F)). 12/20/17   Angiulli, Toribio PARAS, PA-C  acetaminophen  (TYLENOL ) 325 MG tablet Take 650 mg by mouth every 6 (six) hours as  needed.    [provider]  albuterol  (VENTOLIN  HFA) 108 (90 Base) MCG/ACT inhaler Inhale 2 puffs into the lungs every 4 (four) hours as needed for wheezing or shortness of breath. 12/20/17   Angiulli, Toribio PARAS, PA-C  albuterol  (VENTOLIN  HFA) 108 (90 Base) MCG/ACT inhaler Inhale 2 puffs into the lungs every 6 (six) hours as needed for wheezing or shortness of breath.    [provider]  amiodarone  (PACERONE ) 200 MG tablet Take 2 tablets (400 mg total) by mouth 2 (two) times daily for 10 days, THEN 1 tablet (200 mg total) 2 (two) times daily for 20 days. 05/10/24 06/09/24  Alexander, Natalie, DO  amLODipine  (NORVASC ) 10 MG tablet Take 1 tablet (10 mg total) by mouth daily. 12/21/17    Angiulli, Toribio PARAS, PA-C  apixaban  (ELIQUIS ) 5 MG TABS tablet Take 2 tablets (10 mg total) by mouth 2 (two) times daily for 5 days. 02/20/21 05/04/24  Trudy Anthony HERO, MD  apixaban  (ELIQUIS ) 5 MG TABS tablet Take 1 tablet (5 mg total) by mouth 2 (two) times daily. Only start taking this script after you have completed eliquis  10 mg BID x 5 days 02/25/21 03/27/21  Trudy Anthony HERO, MD  apixaban  (ELIQUIS ) 5 MG TABS tablet Take 5 mg by mouth 2 (two) times daily.    [provider]  atorvastatin  (LIPITOR ) 80 MG tablet Take 1 tablet (80 mg total) by mouth daily. 12/20/17   Angiulli, Toribio PARAS, PA-C  atorvastatin  (LIPITOR ) 80 MG tablet Take 80 mg by mouth daily.    [provider]  benzonatate  (TESSALON ) 100 MG capsule Take 1-2 tablets 3 times a day as needed for cough Patient not taking: Reported on 05/04/2024 05/30/22   Immordino, Garnette, FNP  clopidogrel  (PLAVIX ) 75 MG tablet Take 1 tablet (75 mg total) by mouth daily with breakfast. Patient not taking: Reported on 05/04/2024 12/20/17   Angiulli, Toribio PARAS, PA-C  cyclobenzaprine  (FLEXERIL ) 10 MG tablet Take 10 mg by mouth 2 (two) times daily as needed. 02/04/21   [provider]  cyclobenzaprine  (FLEXERIL ) 10 MG tablet Take 10 mg by mouth 2 (two) times daily.    [provider]  dapagliflozin  propanediol (FARXIGA ) 10 MG TABS tablet Take 1 tablet (10 mg total) by mouth daily. 05/11/24   Alexander, Natalie, DO  diclofenac  sodium (VOLTAREN ) 1 % GEL Apply 2 g topically 4 (four) times daily. 12/20/17   Angiulli, Daniel J, PA-C  famotidine  (PEPCID ) 20 MG tablet TAKE 1 TABLET TWICE DAILY AS NEEDED FOR HEARTBURN 08/02/21   [provider]  famotidine  (PEPCID ) 20 MG tablet Take 20 mg by mouth 2 (two) times daily.    [provider]  fenofibrate  160 MG tablet Take 1 tablet (160 mg total) by mouth daily. 12/21/17   Angiulli, Toribio PARAS, PA-C  fenofibrate  160 MG tablet Take 160 mg by mouth every morning.     [provider]  fluconazole (DIFLUCAN) 150 MG tablet Take 150 mg by mouth daily as needed. 02/12/20   [provider]  FLUoxetine  (PROZAC ) 40 MG capsule Take 40 mg by mouth daily. 01/17/21   [provider]  FLUoxetine  (PROZAC ) 40 MG capsule Take 40 mg by mouth in the morning.    [provider]  fluticasone  furoate-vilanterol (BREO ELLIPTA ) 200-25 MCG/ACT AEPB Inhale 1 puff into the lungs daily. 05/11/24   Alexander, Natalie, DO  Fluticasone -Salmeterol (ADVAIR) 250-50 MCG/DOSE AEPB Inhale 1 puff into the lungs 2 (two) times daily. 12/20/17   Pegge Toribio  J, PA-C  furosemide  (LASIX ) 20 MG tablet Take 1 tablet (20 mg total) by mouth daily. Patient not taking: Reported on 05/04/2024 02/27/21   Horton, Charmaine FALCON, MD  furosemide  (LASIX ) 40 MG tablet Take 1 tablet (40 mg total) by mouth 2 (two) times daily. Increase to 1 tablet (40 mg total) by mouth THREE TIMES daily (total daily dose 120 mg) as needed for up to 3 days for increased leg swelling, shortness of breath, weight gain 5+ lbs over 1-2 days. Seek medical care if these symptoms are not improving with increased dose. 05/10/24 05/10/25  Alexander, Natalie, DO  guaiFENesin  (MUCINEX ) 600 MG 12 hr tablet Take 1-2 tablets (600-1,200 mg total) by mouth 2 (two) times daily as needed. 05/10/24   Alexander, Natalie, DO  insulin  aspart (NOVOLOG ) 100 UNIT/ML FlexPen INJECT 20 UNITS UNDER THE SKIN THREE TIMES DAILY BEFORE MEALS PLUS SLIDING SCALE UP TO 80 UNITS EVERY DAY 04/12/20   [provider]  insulin  aspart (NOVOLOG ) 100 UNIT/ML injection Inject 20 Units into the skin 3 (three) times daily before meals.    [provider]  insulin  glargine, 1 Unit Dial, (TOUJEO  SOLOSTAR) 300 UNIT/ML Solostar Pen Inject 65 Units into the skin daily.    [provider]  ketoconazole (NIZORAL) 2 % shampoo LATHER ON WET BEARD. LEAVE ON FOR 3-5 MINUTES AND RINSE. APPLY TWICE WEEKLY FOR 2-4 WEEKS 01/14/21   [provider]  lidocaine  (LIDODERM ) 5 % Place 1 patch onto the skin daily. Remove & Discard patch within 12 hours or as directed by MD Patient not taking: Reported on 05/04/2024 12/21/17   Angiulli, Toribio PARAS, PA-C  linagliptin (TRADJENTA) 5 MG TABS tablet Take 5 mg by mouth in the morning.    [provider]  loratadine  (CLARITIN ) 10 MG tablet Take 1 tablet by mouth daily. 01/12/21   [provider]  loratadine  (CLARITIN ) 10 MG tablet Take 10 mg by mouth in the morning.    [provider]  losartan (COZAAR) 50 MG tablet Take 75 mg by mouth daily. 12/27/20   [provider]  metFORMIN  (GLUCOPHAGE ) 500 MG tablet Take 1,000 mg by mouth 2 (two) times daily with a meal.    [provider]  metFORMIN  (GLUCOPHAGE -XR) 500 MG 24 hr tablet Take 1,000 mg by mouth 2 (two) times daily. 01/13/21   [provider]  methocarbamol  (ROBAXIN ) 500 MG tablet Take 1 tablet (500 mg total) by mouth every 8 (eight) hours as needed for muscle spasms. Patient not taking: Reported on 05/04/2024 12/20/17   Angiulli, Daniel J, PA-C  Multiple Vitamin (MULTIVITAMIN WITH MINERALS) TABS tablet Take 1 tablet by mouth in the morning.    [provider]  nicotine  (NICODERM CQ  - DOSED IN MG/24 HOURS) 14 mg/24hr patch 14 mg patch daily x3 weeks then 7 mg patch daily x3 weeks and stop Patient not taking: Reported on 05/04/2024 12/20/17   Angiulli, Daniel J, PA-C  nystatin (MYCOSTATIN/NYSTOP) powder Apply 1 Application topically 3 (three) times daily. 02/09/21   [provider]  oxyCODONE -acetaminophen  (PERCOCET/ROXICET) 5-325 MG tablet Take 1 tablet by mouth every 4 (four) hours as needed for severe pain. Patient not taking: Reported on 05/04/2024 12/13/22   Sung, Jade J, MD  pantoprazole  (PROTONIX ) 40 MG tablet Take 40 mg by mouth daily.    [provider]  pantoprazole  (PROTONIX ) 40 MG tablet Take 40 mg by mouth in the morning.    [provider]   pregabalin  (LYRICA ) 75 MG capsule  Take 75 mg by mouth 2 (two) times daily. 02/10/21   [provider]  pregabalin  (LYRICA ) 75 MG capsule Take 75 mg by mouth 3 (three) times daily.    [provider]  QUEtiapine  (SEROQUEL ) 25 MG tablet Take 0.5 tablets (12.5 mg total) by mouth 2 (two) times daily as needed (agitation). Patient not taking: Reported on 05/04/2024 12/20/17   Angiulli, Toribio PARAS, PA-C  sacubitril -valsartan  (ENTRESTO ) 24-26 MG Take 1 tablet by mouth 2 (two) times daily. 05/10/24   Alexander, Natalie, DO  senna-docusate (SENOKOT-S) 8.6-50 MG tablet Take 1 tablet by mouth at bedtime. 12/06/17   Johnson, Clanford L, MD  sennosides-docusate sodium  (SENOKOT-S) 8.6-50 MG tablet Take 2 tablets by mouth daily as needed for constipation. 05/10/24   Alexander, Natalie, DO  spironolactone  (ALDACTONE ) 25 MG tablet Take 1 tablet (25 mg total) by mouth daily. 05/11/24   Alexander, Natalie, DO  tamsulosin  (FLOMAX ) 0.4 MG CAPS capsule Take 1 capsule by mouth daily. Patient not taking: Reported on 05/04/2024 10/08/20   [provider]  tiotropium (SPIRIVA) 18 MCG inhalation capsule Place 18 mcg into inhaler and inhale daily.    [provider]  topiramate  (TOPAMAX ) 25 MG tablet Take 1 tablet (25 mg total) by mouth 2 (two) times daily. Patient not taking: Reported on 05/04/2024 12/20/17   Angiulli, Toribio PARAS, PA-C  TOUJEO  SOLOSTAR 300 UNIT/ML Solostar Pen Inject 65 Units into the skin daily at 6 (six) AM. 01/14/21   [provider]  traMADol  (ULTRAM ) 50 MG tablet Take 0.5-1 tablets (25-50 mg total) by mouth every 6 (six) hours as needed for moderate pain or severe pain. 12/20/17   Angiulli, Toribio PARAS, PA-C  umeclidinium bromide  (INCRUSE ELLIPTA ) 62.5 MCG/ACT AEPB Inhale 1 puff into the lungs daily. CAN TAKE HOME HOSPITAL DEVICES - REFILL OUTPATIENT 05/11/24   Marsa Edelman, DO    Physical Exam: Vitals:   07/01/24 0841 07/01/24 0844 07/01/24 1243  BP: (!) 135/57   122/80  Pulse: 85  84  Resp: 20  20  Temp: 98.6 F (37 C)  99.1 F (37.3 C)  TempSrc: Oral  Oral  SpO2: 94%  99%  Weight:  (!) 139.3 kg   Height:  6' 1 (1.854 m)     Physical Exam   Constitutional: Alert, awake, calm, comfortable HEENT: Neck supple Respiratory: Clear to auscultation B/L, no wheezing, no rales.  Cardiovascular: Regular rate and rhythm, no murmurs / rubs / gallops. No extremity edema. 2+ pedal pulses. No carotid bruits.  Abdomen: Soft, no tenderness, Bowel sounds positive.  Musculoskeletal: no clubbing / cyanosis. Good ROM, no contractures. Normal muscle tone.  Skin: no rashes, lesions, ulcers.  Bilateral lower extremity have a scab, left heel has a covered chronic looking wound Neurologic: CN 2-12 grossly intact. Sensation intact, No focal deficit identified Psychiatric: Alert and oriented x 3. Normal mood.    Labs on Admission: I have personally reviewed following labs and imaging studies  CBC: Recent Labs  Lab 07/01/24 1146  WBC 12.7*  NEUTROABS 10.5*  HGB 9.2*  HCT 29.8*  MCV 79.0*  PLT 116*   Basic Metabolic Panel: Recent Labs  Lab 07/01/24 1146  NA 133*  K 4.5  CL 95*  CO2 21*  GLUCOSE 191*  BUN 27*  CREATININE 1.84*  CALCIUM  9.0   GFR: Estimated Creatinine Clearance: 63.4 mL/min (A) (by C-G formula based on SCr of 1.84 mg/dL (H)). Liver Function Tests: No results for input(s): AST, ALT, ALKPHOS, BILITOT, PROT, ALBUMIN in the  last 168 hours. No results for input(s): LIPASE, AMYLASE in the last 168 hours. No results for input(s): AMMONIA in the last 168 hours. Coagulation Profile: No results for input(s): INR, PROTIME in the last 168 hours. Cardiac Enzymes: No results for input(s): CKTOTAL, CKMB, CKMBINDEX, TROPONINI, TROPONINIHS in the last 168 hours. BNP (last 3 results) No results for input(s): BNP in the last 8760 hours. HbA1C: No results for input(s): HGBA1C in the last 72 hours. CBG: No  results for input(s): GLUCAP in the last 168 hours. Lipid Profile: No results for input(s): CHOL, HDL, LDLCALC, TRIG, CHOLHDL, LDLDIRECT in the last 72 hours. Thyroid Function Tests: No results for input(s): TSH, T4TOTAL, FREET4, T3FREE, THYROIDAB in the last 72 hours. Anemia Panel: No results for input(s): VITAMINB12, FOLATE, FERRITIN, TIBC, IRON, RETICCTPCT in the last 72 hours. Urine analysis:    Component Value Date/Time   COLORURINE YELLOW (A) 07/01/2024 1050   APPEARANCEUR CLEAR (A) 07/01/2024 1050   LABSPEC 1.020 07/01/2024 1050   PHURINE 6.0 07/01/2024 1050   GLUCOSEU >=500 (A) 07/01/2024 1050   HGBUR NEGATIVE 07/01/2024 1050   BILIRUBINUR NEGATIVE 07/01/2024 1050   KETONESUR NEGATIVE 07/01/2024 1050   PROTEINUR 30 (A) 07/01/2024 1050   NITRITE NEGATIVE 07/01/2024 1050   LEUKOCYTESUR NEGATIVE 07/01/2024 1050    Radiological Exams on Admission: I have personally reviewed images CT CHEST ABDOMEN PELVIS WO CONTRAST Result Date: 07/01/2024 CLINICAL DATA:  Fall.  Left-sided chest pain. EXAM: CT CHEST, ABDOMEN AND PELVIS WITHOUT CONTRAST TECHNIQUE: Multidetector CT imaging of the chest, abdomen and pelvis was performed following the standard protocol without IV contrast. RADIATION DOSE REDUCTION: This exam was performed according to the departmental dose-optimization program which includes automated exposure control, adjustment of the mA and/or kV according to patient size and/or use of iterative reconstruction technique. COMPARISON:  Chest CTA 02/26/2021 FINDINGS: CT CHEST FINDINGS Cardiovascular: No acute vascular findings are demonstrated on noncontrast imaging. There is atherosclerosis of the aorta, great vessels and coronary arteries. There are calcifications of the aortic valve. The heart size is normal. There is no pericardial effusion. Mediastinum/Nodes: There are no enlarged mediastinal, hilar or axillary lymph nodes. No evidence of mediastinal  hematoma. The thyroid gland, trachea and esophagus demonstrate no significant findings. Lungs/Pleura: No pleural effusion or pneumothorax. Moderate centrilobular and paraseptal emphysema with interval resolution of the previously demonstrated patchy ground-glass opacities bilaterally. There is scattered pulmonary scarring and atelectasis. Atelectasis at the left lung base has increased from the previous study. No suspicious pulmonary nodules. Musculoskeletal/Chest wall: There are acute fractures of the left 9th, 10th and 11th ribs. The 9th and 10th rib fractures are moderately displaced. There are segmental fractures of the 10th rib. The sternum and thoracic spine appear intact. CT ABDOMEN AND PELVIS FINDINGS Hepatobiliary: Hepatomegaly with mild contour irregularity and generalized low density. No focal lesions identified on noncontrast imaging. Dependent density within the gallbladder lumen, most consistent with multiple small gallstones. No evidence of gallbladder wall thickening, surrounding inflammation or biliary ductal dilatation. Pancreas: Unremarkable. No pancreatic ductal dilatation or surrounding inflammatory changes. Spleen: The spleen appears enlarged, measuring approximately 16.1 x 9.8 x 10.1 cm. No focal splenic lesions are identified. No evidence of acute splenic injury. Adrenals/Urinary Tract: Both adrenal glands appear normal. No evidence of urinary tract calculus, hydronephrosis or perinephric soft tissue stranding. No evidence of acute renal injury. The bladder appears normal for its degree of distention. Stomach/Bowel: No enteric contrast administered. The stomach appears unremarkable for its degree of distension. No evidence of bowel wall  thickening, distention or surrounding inflammatory change. No evidence of bowel or mesenteric injury. Mildly prominent stool throughout the colon. The appendix appears normal. Vascular/Lymphatic: There are no enlarged abdominal or pelvic lymph nodes. Aortic  and branch vessel atherosclerosis with severe involvement of the femoral arteries bilaterally. No evidence of retroperitoneal hematoma. Reproductive: The prostate gland appears unremarkable. Other: Asymmetric fat in the left inguinal canal. No evidence of ascites, hemoperitoneum or pneumoperitoneum. Musculoskeletal: No acute osseous findings are identified within the abdomen or pelvis. Mild lumbar spondylosis. IMPRESSION: 1. Acute fractures of the left 9th, 10th and 11th ribs as described. No evidence of pneumothorax or hemothorax. 2. No other acute posttraumatic findings identified in the chest, abdomen or pelvis. 3. Hepatosplenomegaly with probable hepatic steatosis. 4. Cholelithiasis without evidence of cholecystitis or biliary ductal dilatation. 5. Aortic Atherosclerosis (ICD10-I70.0) and Emphysema (ICD10-J43.9). Electronically Signed   By: Elsie Perone M.D.   On: 07/01/2024 13:15   CT Head Wo Contrast Result Date: 07/01/2024 EXAM: CT HEAD WITHOUT CONTRAST 07/01/2024 12:12:26 PM TECHNIQUE: CT of the head was performed without the administration of intravenous contrast. Automated exposure control, iterative reconstruction, and/or weight based adjustment of the mA/kV was utilized to reduce the radiation dose to as low as reasonably achievable. COMPARISON: CT head 12/13/2022. CLINICAL HISTORY: 58 year old male. Trip and fall striking furniture. FINDINGS: BRAIN AND VENTRICLES: No acute hemorrhage. No evidence of acute infarct. Chronic encephalomalacia in the posterior right MCA territory, and affecting the posterior cerebellum to a lesser extent. Chronic lateral and third ventricular enlargement, with tapered cerebral aqueduct and normal size fourth ventricle. Stable ventricle size and configuration since 2024. No transependymal edema. Stable gray white differentiation. No mass effect or midline shift. Calcified atherosclerosis at the skull base. No suspicious intracranial vascular hyperdensity. ORBITS: No  acute abnormality. SINUSES: Paranasal sinuses, tympanic cavities and mastoids remain well aerated. SOFT TISSUES AND SKULL: Chronic midline suboccipital craniotomy. Chronic right vertex burr hole in the skull. No acute soft tissue abnormality. No skull fracture. IMPRESSION: 1. No acute intracranial abnormality or acute traumatic injury identified. 2. Chronic right MCA infarct and post operative changes from suboccipital craniotomy. 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 Electronically signed by: Helayne Hurst MD 07/01/2024 12:43 PM EST RP Workstation: [id number]   CT Cervical Spine Wo Contrast Result Date: 07/01/2024 EXAM: CT CERVICAL SPINE WITHOUT CONTRAST 07/01/2024 12:12:26 PM TECHNIQUE: CT of the cervical spine was performed without the administration of intravenous contrast. Multiplanar reformatted images are provided for review. Automated exposure control, iterative reconstruction, and/or weight based adjustment of the mA/kV was utilized to reduce the radiation dose to as low as reasonably achievable. COMPARISON: CT head reported separately 07/01/2024 and prior CT neck 03/29/2008. CLINICAL HISTORY: 59 year old male with a history of trip and fall striking furniture. FINDINGS: BONES AND ALIGNMENT: Chronic straightening of cervical lordosis. Degenerative ankylosis of the left C2-C3 facets has developed since 2019. Chronic midline suboccipital craniotomy. No acute fracture or traumatic malalignment. DEGENERATIVE CHANGES: Chronic cervical disc and endplate degeneration elsewhere. SOFT TISSUES: Calcified atherosclerosis at the skull base. Partially visible chronic emphysema at the lung apices. Chronic surgical clips. Otherwise negative visible non-contrast neck soft tissues. No prevertebral soft tissue swelling. LUNGS: Partially visible chronic emphysema at the lung apices. IMPRESSION: 1. No acute traumatic injury identified  in the cervical spine. 2. Degenerative left C2-C3 facet ankylosis. 3. Emphysema (ICD10-J43.9). Electronically signed by: Helayne Hurst MD 07/01/2024 12:37 PM EST RP Workstation: [id number]    EKG: My  personal interpretation of EKG shows: Sinus rhythm with PVCs    Assessment/Plan Principal Problem:   Fall at home, initial encounter Active Problems:   Type 2 diabetes mellitus without complications (HCC)   Essential hypertension   COPD (chronic obstructive pulmonary disease) (HCC)   History of stroke   History of CEA (carotid endarterectomy)   Heart failure with mid-range ejection fraction (HFmEF) (HCC)   OSA (obstructive sleep apnea)   Pain   Left rib fracture    Assessment and Plan: 59 year old male with multiple medical problems including but not limited to COPD on home oxygen 2 L, CHF, OSA on CPAP, history of stroke s/p CEA, DM, HTN, who was brought in to ED after a fall at home this morning.  1.  Mechanical fall/left-sided rib fracture, 9, 10 and 11 - Patient normally uses cane but he did not want to wake his wife this morning. - Has left-sided rib fracture - Pain management - Incentive spirometry - Oxygen to maintain saturation more than 90% - PT/OT  2.  Intractable pain on the fracture site of the ribs - Patient cannot take morphine - Patient has been placed on Tylenol , oxycodone  and fentanyl .  3.  Type 2 diabetes with peripheral neuropathy - Resume his home dose of Lantus  and insulin  sliding scale - Continue to monitor blood sugars - Continue Lyrica   4.  HTN - Resume his home medications  5.  COPD/CHF/OSA - Continue oxygen to maintain saturation more than 90% - Resume CPAP during the night - Change Lasix  to once a day due to mild AKI. - He received 1 L of IV fluid in the emergency room. - Nebulization  6.  History of DVT/PE/lower limb ischemia - Continue Eliquis ,statin  7.  Depression - Resume home medications  8.  Paroxysmal atrial fibrillation - Patient  is on amiodarone  and Eliquis  - Continue amiodarone  and Eliquis  at home dose  9.  Slightly elevated troponin but less than last time - Patient denies any chest pain - Will continue to monitor symptoms. - I believe this is related to chronic congestive heart failure. - No further intervention at this point except his symptoms get worsen.     DVT prophylaxis: Eliquis  Code Status: DNR/DNI(Do NOT Intubate) Family Communication: Wife and daughter at bedside Disposition Plan: Home versus rehab Consults called: None Admission status: Observation, Telemetry bed   Nena Rebel, MD [facility] 07/01/2024, 3:40 PM        [1]  Allergies Allergen Reactions   Latex    Morphine And Codeine Other (See Comments)    Makes pt hyper   Morphine And Codeine    Sulfa Antibiotics Hives   Trulicity [Dulaglutide] Nausea And Vomiting   "

## 2024-07-02 ENCOUNTER — Encounter: Payer: Self-pay | Admitting: Hospitalist

## 2024-07-02 ENCOUNTER — Other Ambulatory Visit: Payer: Self-pay

## 2024-07-02 LAB — CBC
HCT: 26.5 % — ABNORMAL LOW (ref 39.0–52.0)
Hemoglobin: 8.3 g/dL — ABNORMAL LOW (ref 13.0–17.0)
MCH: 24.9 pg — ABNORMAL LOW (ref 26.0–34.0)
MCHC: 31.3 g/dL (ref 30.0–36.0)
MCV: 79.6 fL — ABNORMAL LOW (ref 80.0–100.0)
Platelets: 102 K/uL — ABNORMAL LOW (ref 150–400)
RBC: 3.33 MIL/uL — ABNORMAL LOW (ref 4.22–5.81)
RDW: 17.1 % — ABNORMAL HIGH (ref 11.5–15.5)
WBC: 9.9 K/uL (ref 4.0–10.5)
nRBC: 0 % (ref 0.0–0.2)

## 2024-07-02 LAB — COMPREHENSIVE METABOLIC PANEL WITH GFR
ALT: 28 U/L (ref 0–44)
AST: 32 U/L (ref 15–41)
Albumin: 4 g/dL (ref 3.5–5.0)
Alkaline Phosphatase: 49 U/L (ref 38–126)
Anion gap: 12 (ref 5–15)
BUN: 28 mg/dL — ABNORMAL HIGH (ref 6–20)
CO2: 23 mmol/L (ref 22–32)
Calcium: 8.4 mg/dL — ABNORMAL LOW (ref 8.9–10.3)
Chloride: 98 mmol/L (ref 98–111)
Creatinine, Ser: 1.69 mg/dL — ABNORMAL HIGH (ref 0.61–1.24)
GFR, Estimated: 46 mL/min — ABNORMAL LOW
Glucose, Bld: 188 mg/dL — ABNORMAL HIGH (ref 70–99)
Potassium: 4.2 mmol/L (ref 3.5–5.1)
Sodium: 132 mmol/L — ABNORMAL LOW (ref 135–145)
Total Bilirubin: 0.3 mg/dL (ref 0.0–1.2)
Total Protein: 6.6 g/dL (ref 6.5–8.1)

## 2024-07-02 LAB — CBG MONITORING, ED
Glucose-Capillary: 164 mg/dL — ABNORMAL HIGH (ref 70–99)
Glucose-Capillary: 211 mg/dL — ABNORMAL HIGH (ref 70–99)

## 2024-07-02 MED ORDER — LIDOCAINE 5 % EX OINT
1.0000 | TOPICAL_OINTMENT | CUTANEOUS | 0 refills | Status: AC | PRN
  Filled 2024-07-02: qty 50, 30d supply, fill #0

## 2024-07-02 MED ORDER — LIDOCAINE 5 % EX OINT: 1.0000 | TOPICAL_OINTMENT | CUTANEOUS | 0 refills | Status: DC | PRN

## 2024-07-02 NOTE — TOC Initial Note (Addendum)
 Transition of Care (TOC) - Initial/Assessment Note    Patient Details  Name: Zachary Deleon. MRN: [id number] Date of Birth: 1966/02/19  Transition of Care [facility]) CM/SW Contact:    Miliyah Luper L Kaedance Magos, LCSW Phone Number: 07/02/2024, 11:08 AM  Clinical Narrative:                    Chart reviewed. Patient is established with [facility] for PT and OT. CSW requested that RN is added. CSW notified [facility] in patient portal.     Patient requested a rolling walker. Rolling walker ordered through [employer].   11:53am: Update received from [employer]. Patients account is on hold due to unpaid balances. Patient will need to resolve those in order for the RW to be processed.    Patient Goals and CMS Choice            Expected Discharge Plan and Services                                              Prior Living Arrangements/Services                       Activities of Daily Living      Permission Sought/Granted                  Emotional Assessment              Admission diagnosis:  Pain [R52] Patient Active Problem List   Diagnosis Date Noted   Pain 07/01/2024   Fall at home, initial encounter 07/01/2024   Left rib fracture 07/01/2024   Obesity, Class III, BMI 40-49.9 (morbid obesity) (HCC) 05/05/2024   History of DVT (deep vein thrombosis) 05/05/2024   History of pulmonary embolus (PE) 05/05/2024   Seizure disorder (HCC) 05/05/2024   History of stroke 05/05/2024   T2DM (type 2 diabetes mellitus) (HCC) 05/05/2024   COPD (chronic obstructive pulmonary disease) (HCC) 05/05/2024   Essential hypertension 05/05/2024   PAD (peripheral artery disease) 05/05/2024   History of CEA (carotid endarterectomy) 05/05/2024   Heart failure with mid-range ejection fraction (HFmEF) (HCC) 05/05/2024   OSA (obstructive sleep apnea) 05/05/2024   CHF exacerbation (HCC) 05/04/2024   Tobacco user 11/24/2022   Acute respiratory disease due to COVID-19  virus 02/16/2021   DVT (deep venous thrombosis) (HCC)    Seizures (HCC)    Severe sepsis (HCC)    Thrombocytopenia    Hypomagnesemia    Hyponatremia    Type 2 diabetes mellitus with obesity    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 ([place]),  - [address] [place] ([place]) [place] [address] Phone: [phone] Fax: [phone]  [facility] [address] [place] - [address] [place] [address] Phone: [phone] Fax: [phone]  [facility] - [place], [place] - [address] [place] [address] Phone: [phone] Fax: [phone]  [facility] [address] [place] [address] Phone: [phone] Fax: [phone]     Social Drivers of Health (SDOH) Social History: SDOH Screenings   Food Insecurity: No Food Insecurity (05/04/2024)  Housing: Unknown (05/22/2024)   Received from [facility]  Transportation Needs: No Transportation Needs (05/04/2024)  Utilities: Not At Risk (05/04/2024)  Financial Resource Strain: Low Risk (05/06/2024)  Social Connections: Moderately Integrated (05/04/2024)  Tobacco Use: Medium Risk (07/02/2024)   SDOH Interventions:     Readmission Risk Interventions     No data to display

## 2024-07-02 NOTE — Progress Notes (Signed)
 The beneficiary has a mobility limitation that significantly impairs his/her ability to participate in one or more mobility-related activities of daily living (MRADL) in the home. The patient is able to safely use the walker. The functional mobility deficit can be sufficiently resolved by use of walker.

## 2024-07-02 NOTE — Evaluation (Signed)
 Physical Therapy Evaluation Patient Details Name: [facility] MRN: [id number] DOB: [birth year] Today's Date: 07/02/2024  History of Present Illness  Zachary Deleon is a 59 y.o. male with COPD on 2 L oxygen at baseline with CHF, extensive tobacco abuse history, CAD, diabetes, carotid stenosis, hypertension, morbid obesity with a BMI of 45, who was here 2 months ago for respiratory distress.  Returns now after fall out of bed with L rib fractures.  Clinical Impression  Pt on 3L O2 on arrival with minimal c/o shortness of breath, does endorse significant L rib pain with all activity (and with coughing) but was pleasant and willing to do some mobility tasks with PT.  He reports he typically uses a cane but today he was unsteady with it and did require a RW to do some limited ambulation.  Pt is essentially home bound (uses w/c to occasionally get to MD appointments) with limited activity tolerance.  Fortunately he was able to maintain SpO2 in the mid/high 90s on baseline 3L O2 during today's effort.  Pt showed poor safety with SPC, suggesting RW use going forward.  Pt is not at his baseline, will benefit from continued PT to address functional limitations.        If plan is discharge home, recommend the following: A little help with walking and/or transfers;A lot of help with bathing/dressing/bathroom;Assistance with cooking/housework;Assist for transportation;Help with stairs or ramp for entrance   Can travel by private vehicle        Equipment Recommendations None recommended by PT  Recommendations for Other Services       Functional Status Assessment Patient has had a recent decline in their functional status and demonstrates the ability to make significant improvements in function in a reasonable and predictable amount of time.     Precautions / Restrictions Precautions Precautions: Fall Recall of Precautions/Restrictions: Intact Restrictions Weight Bearing Restrictions Per  Provider Order: No      Mobility  Bed Mobility               General bed mobility comments: seated EOB pre/post session    Transfers Overall transfer level: Needs assistance Equipment used: Rolling walker (2 wheels), Straight cane Transfers: Sit to/from Stand Sit to Stand: Min assist, From elevated surface           General transfer comment: sit to stand with SPC, then walker. Pt unable to rise from standard height bed, minA and ~2 elevated surface for success.    Ambulation/Gait Ambulation/Gait assistance: Min assist Gait Distance (Feet): 35 Feet Assistive device: Rolling walker (2 wheels)         General Gait Details: Initially attempted to ambualte wit SPC, pt showed poor ability to control L knee from buckling and generally displayed poor safety and ambulation aborted after just a few steps near the bed.  Pt did manage to do some limited ambulation with the walker - highly reliant on UEs to maintain balance and did have an episode of losing balance backward and needing direct assist to maintain upright.  Stairs            Wheelchair Mobility     Tilt Bed    Modified Rankin (Stroke Patients Only)       Balance Overall balance assessment: Needs assistance Sitting-balance support: No upper extremity supported Sitting balance-Leahy Scale: Good     Standing balance support: Bilateral upper extremity supported Standing balance-Leahy Scale: Fair Standing balance comment: poor balance with SPC, improved with RW, still had  unsteadiness and 1 episode of losing balance backward                             Pertinent Vitals/Pain Pain Assessment Pain Assessment: 0-10 Pain Score: 6  Pain Location: R ribs    Home Living Family/patient expects to be discharged to:: Private residence Living Arrangements: Spouse/significant other;Children Available Help at Discharge: Family;Available 24 hours/day Type of Home: Mobile home Home Access: Ramped  entrance       Home Layout: One level Home Equipment: Cane - single point;Wheelchair - manual;Wheelchair - power;Lift Equities Trader (2 wheels);Rollator (4 wheels) Additional Comments: O2 compressor    Prior Function Prior Level of Function : Needs assist             Mobility Comments: Hx of stroke, uses SPC, only out of home for MD ADLs Comments: assist for dressing and IADLs     Extremity/Trunk Assessment   Upper Extremity Assessment Upper Extremity Assessment: Generalized weakness    Lower Extremity Assessment Lower Extremity Assessment: Generalized weakness (L grossly 3+/5, R grossly 4/5)       Communication   Communication Communication: No apparent difficulties Factors Affecting Communication: Reduced clarity of speech    Cognition Arousal: Alert Behavior During Therapy: Anxious   PT - Cognitive impairments: No apparent impairments                         Following commands: Intact       Cueing Cueing Techniques: Verbal cues, Gestural cues, Tactile cues     General Comments General comments (skin integrity, edema, etc.): Pt's O2 did remain in the mid/upper 90s t/o the session on baseline 3L O2    Exercises     Assessment/Plan    PT Assessment Patient needs continued PT services  PT Problem List Decreased strength;Decreased range of motion;Decreased activity tolerance;Decreased balance;Decreased mobility;Decreased safety awareness;Decreased knowledge of use of DME       PT Treatment Interventions DME instruction;Gait training;Functional mobility training;Therapeutic activities;Therapeutic exercise;Balance training;Patient/family education    PT Goals (Current goals can be found in the Care Plan section)  Acute Rehab PT Goals Patient Stated Goal: go home and get stronger PT Goal Formulation: With patient Time For Goal Achievement: 07/15/24 Potential to Achieve Goals: Fair    Frequency Min 2X/week      Co-evaluation               AM-PAC PT 6 Clicks Mobility  Outcome Measure Help needed turning from your back to your side while in a flat bed without using bedrails?: A Little Help needed moving from lying on your back to sitting on the side of a flat bed without using bedrails?: A Little Help needed moving to and from a bed to a chair (including a wheelchair)?: A Little Help needed standing up from a chair using your arms (e.g., wheelchair or bedside chair)?: A Little Help needed to walk in hospital room?: A Lot Help needed climbing 3-5 steps with a railing? : A Lot 6 Click Score: 16    End of Session Equipment Utilized During Treatment: Gait belt;Oxygen (3L (baseline)) Activity Tolerance: Patient limited by fatigue Patient left: in bed;with bed alarm set;with call bell/phone within reach;with family/visitor present Nurse Communication: Mobility status PT Visit Diagnosis: Unsteadiness on feet (R26.81);Muscle weakness (generalized) (M62.81);Difficulty in walking, not elsewhere classified (R26.2)    Time: 9156-9088 PT Time Calculation (min) (ACUTE ONLY): 28 min  Charges:   PT Evaluation $PT Eval Low Complexity: 1 Low PT Treatments $Gait Training: 8-22 mins PT General Charges $$ ACUTE PT VISIT: 1 Visit         Carmin JONELLE Deed, DPT 07/02/2024, 10:59 AM

## 2024-07-02 NOTE — Evaluation (Signed)
 Occupational Therapy Evaluation Patient Details Name: [facility]. MRN: [id number] DOB: 09/16/1965 Today's Date: 07/02/2024   History of Present Illness   Zachary Deleon is a 59 y.o. male with COPD on 2 L oxygen at baseline with CHF, extensive tobacco abuse history, CAD, diabetes, carotid stenosis, hypertension, morbid obesity with a BMI of 45, who was here 2 months ago for respiratory distress.  Returns now after fall out of bed with L rib fractures.     Clinical Impressions Patient presenting with decreased Ind in self care,balance, functional mobility, transfers, endurance, and safety awareness. Patient reports being Mod I at baseline with short distance mobility with use of SPC. Pt needing assistance with ADLs and IADLs from wife. Patient currently functioning with mod A for bed mobility and min A to stand from elevated bed height. Pt taking side steps along EOB with min A. Pt on 3Ls during session which is baseline O2 need.  Patient will benefit from acute OT to increase overall independence in the areas of ADLs, functional mobility, and safety awareness in order to safely discharge.      If plan is discharge home, recommend the following:   A lot of help with bathing/dressing/bathroom;A little help with walking and/or transfers;Assistance with cooking/housework;Assist for transportation     Functional Status Assessment   Patient has had a recent decline in their functional status and demonstrates the ability to make significant improvements in function in a reasonable and predictable amount of time.     Equipment Recommendations   None recommended by OT      Precautions/Restrictions   Precautions Precautions: Fall Recall of Precautions/Restrictions: Intact Restrictions Weight Bearing Restrictions Per Provider Order: No     Mobility Bed Mobility Overal bed mobility: Needs Assistance Bed Mobility: Supine to Sit, Sit to Supine     Supine to sit: Mod  assist Sit to supine: Mod assist        Transfers Overall transfer level: Needs assistance Equipment used: Rolling walker (2 wheels) Transfers: Sit to/from Stand Sit to Stand: Min assist, From elevated surface                  Balance Overall balance assessment: Needs assistance Sitting-balance support: No upper extremity supported Sitting balance-Leahy Scale: Good     Standing balance support: Bilateral upper extremity supported Standing balance-Leahy Scale: Fair                             ADL either performed or assessed with clinical judgement   ADL Overall ADL's : Needs assistance/impaired                         Toilet Transfer: Minimal assistance;Rolling walker (2 wheels) Toilet Transfer Details (indicate cue type and reason): simulated                 Vision Patient Visual Report: No change from baseline              Pertinent Vitals/Pain Pain Assessment Pain Assessment: Faces Faces Pain Scale: Hurts whole lot Pain Location: R ribs Pain Descriptors / Indicators: Aching, Discomfort, Grimacing, Guarding Pain Intervention(s): Limited activity within patient's tolerance, Monitored during session, Repositioned     Extremity/Trunk Assessment Upper Extremity Assessment Upper Extremity Assessment: Generalized weakness;LUE deficits/detail LUE Deficits / Details: L shoulder elevation to ~ 90 degrees secondary to pain   Lower Extremity Assessment Lower Extremity Assessment: Generalized weakness  Communication Communication Communication: No apparent difficulties Factors Affecting Communication: Reduced clarity of speech   Cognition Arousal: Alert Behavior During Therapy: Anxious Cognition: No apparent impairments                               Following commands: Intact       Cueing  General Comments   Cueing Techniques: Verbal cues;Gestural cues;Tactile cues  Pt's O2 did remain in the mid/upper  90s t/o the session on baseline 3L O2           Home Living Family/patient expects to be discharged to:: Private residence Living Arrangements: Spouse/significant other;Children Available Help at Discharge: Family;Available 24 hours/day Type of Home: Mobile home Home Access: Ramped entrance     Home Layout: One level     Bathroom Shower/Tub: Producer, Television/film/video: Handicapped height Bathroom Accessibility: Yes   Home Equipment: Cane - single point;Wheelchair - manual;Wheelchair - power;Lift Equities Trader (2 wheels);Rollator (4 wheels)   Additional Comments: O2 concentrator      Prior Functioning/Environment Prior Level of Function : Needs assist             Mobility Comments: Hx of stroke, uses SPC, only out of home for MD ADLs Comments: assist for dressing and IADLs from wife    OT Problem List: Decreased strength;Decreased activity tolerance;Impaired balance (sitting and/or standing);Pain;Cardiopulmonary status limiting activity   OT Treatment/Interventions: Self-care/ADL training;Balance training;Therapeutic exercise;Therapeutic activities;Energy conservation;DME and/or AE instruction;Patient/family education      OT Goals(Current goals can be found in the care plan section)   Acute Rehab OT Goals Patient Stated Goal: to decrease pain and go home OT Goal Formulation: With patient/family Time For Goal Achievement: 07/16/24 Potential to Achieve Goals: Fair ADL Goals Pt Will Perform Grooming: with supervision;standing Pt Will Perform Lower Body Dressing: with contact guard assist;sit to/from stand Pt Will Transfer to Toilet: with contact guard assist;ambulating Pt Will Perform Toileting - Clothing Manipulation and hygiene: with contact guard assist;sit to/from stand   OT Frequency:  Min 2X/week       AM-PAC OT 6 Clicks Daily Activity     Outcome Measure Help from another person eating meals?: None Help from another  person taking care of personal grooming?: A Little Help from another person toileting, which includes using toliet, bedpan, or urinal?: A Little Help from another person bathing (including washing, rinsing, drying)?: A Little Help from another person to put on and taking off regular upper body clothing?: A Little Help from another person to put on and taking off regular lower body clothing?: A Little 6 Click Score: 19   End of Session Equipment Utilized During Treatment: Oxygen Nurse Communication: Mobility status  Activity Tolerance: Patient tolerated treatment well Patient left: in bed  OT Visit Diagnosis: Unsteadiness on feet (R26.81);Muscle weakness (generalized) (M62.81);Repeated falls (R29.6)                Time: 8983-8965 OT Time Calculation (min): 18 min Charges:  OT General Charges $OT Visit: 1 Visit OT Evaluation $OT Eval Moderate Complexity: 1 Mod OT Treatments $Therapeutic Activity: 8-22 mins  Izetta Claude, MS, OTR/L , CBIS ascom [phone]  07/02/24, 12:42 PM

## 2024-07-02 NOTE — Discharge Instructions (Signed)
 Home Health resumption orders sent to [facility]. Nursing was added.

## 2024-07-02 NOTE — Discharge Summary (Signed)
 " Physician Discharge Summary   Patient: Zachary [facility]. MRN: [id number] DOB: 1966/04/28  Admit date:     07/01/2024  Discharge date: 07/02/24  Discharge Physician: Murvin Mana   PCP: [name], NP   Recommendations at discharge:   Follow-up with PCP in 1 week.  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 Mild hyponatremia. Chronic kidney disease stage IIIa. Thrombocytopenia Resolved Problems:   * No resolved hospital problems. *  Hospital Course:  [facility] is a pleasant 59 y.o. male with medical history significant for arthritis, CHF, COPD on home oxygen, diabetes, HTN, HLD, depression, history of stroke who came into ED at [facility] after a fall at home.  Per patient and the wife, patient got up at night, tried to walk with a cane, he fell on the ground by accident.  No syncope.  Chest x-ray showed a rib fracture, patient was treated with pain medicine.  Patient was also evaluated by physical therapy, recommend home health.  At this point, patient will be medically stable for discharge.  I recommend pain control with acetaminophen  and lidocaine  ornament.  Follow-up with PCP as outpatient.        Consultants: None Procedures performed: None  Disposition: Home health Diet recommendation:  Discharge Diet Orders (From admission, onward)     Start     Ordered   07/02/24 0000  Diet - low sodium heart healthy        07/02/24 1126           Cardiac diet DISCHARGE MEDICATION: Allergies as of  07/02/2024       Reactions   Latex    Morphine And Codeine Other (See Comments)   Makes pt hyper   Morphine And Codeine    Sulfa Antibiotics Hives   Trulicity [dulaglutide] Nausea And Vomiting        Medication List     STOP taking these medications    losartan 50 MG tablet Commonly known as: COZAAR   spironolactone  25 MG tablet Commonly known as: ALDACTONE        TAKE these medications    Accu-Chek Guide test strip Generic drug: glucose blood   acetaminophen  325 MG tablet Commonly known as: TYLENOL  Take 650 mg by mouth every 6 (six) hours as needed.   albuterol  108 (90 Base) MCG/ACT inhaler Commonly known as: VENTOLIN  HFA Inhale 2 puffs into the lungs every 6 (six) hours as needed for wheezing or shortness of breath.   albuterol  (2.5 MG/3ML) 0.083% nebulizer solution Commonly known as: PROVENTIL    amiodarone  200 MG tablet Commonly known as: PACERONE  Take 2 tablets (400 mg total) by mouth 2 (two) times daily for 10 days, THEN 1 tablet (200 mg total) 2 (two) times daily for 20 days. Start taking on: May 10, 2024   amLODipine  10 MG tablet Commonly known as: NORVASC  Take 1 tablet (10 mg total) by mouth daily.   apixaban  5 MG Tabs tablet Commonly known as: ELIQUIS  Take 5 mg by mouth 2 (two) times daily.   atorvastatin  80  MG tablet Commonly known as: LIPITOR  Take 40 mg by mouth daily.   cyclobenzaprine  10 MG tablet Commonly known as: FLEXERIL  Take 10 mg by mouth 2 (two) times daily.   famotidine  20 MG tablet Commonly known as: PEPCID  Take 20 mg by mouth 2 (two) times daily.   Farxiga  10 MG Tabs tablet Generic drug: dapagliflozin  propanediol Take 1 tablet (10 mg total) by mouth daily.   fenofibrate  160 MG tablet Take 160 mg by mouth every morning.   FLUoxetine  40 MG capsule Commonly known as: PROZAC  Take 40 mg by mouth in the morning.   fluticasone  furoate-vilanterol 200-25 MCG/ACT Aepb Commonly known as: BREO ELLIPTA  Inhale 1 puff into  the lungs daily.   furosemide  40 MG tablet Commonly known as: Lasix  Take 1 tablet (40 mg total) by mouth 2 (two) times daily. Increase to 1 tablet (40 mg total) by mouth THREE TIMES daily (total daily dose 120 mg) as needed for up to 3 days for increased leg swelling, shortness of breath, weight gain 5+ lbs over 1-2 days. Seek medical care if these symptoms are not improving with increased dose.   guaiFENesin  600 MG 12 hr tablet Commonly known as: MUCINEX  Take 1-2 tablets (600-1,200 mg total) by mouth 2 (two) times daily as needed.   insulin  aspart 100 UNIT/ML FlexPen Commonly known as: NOVOLOG  Inject 15 Units into the skin 3 (three) times daily with meals.   ketoconazole 2 % shampoo Commonly known as: NIZORAL LATHER ON WET BEARD. LEAVE ON FOR 3-5 MINUTES AND RINSE. APPLY TWICE WEEKLY FOR 2-4 WEEKS   lidocaine  5 % ointment Commonly known as: XYLOCAINE  Apply 1 Application topically as needed.   linagliptin 5 MG Tabs tablet Commonly known as: TRADJENTA Take 5 mg by mouth in the morning.   loratadine  10 MG tablet Commonly known as: CLARITIN  Take 10 mg by mouth in the morning.   metFORMIN  500 MG 24 hr tablet Commonly known as: GLUCOPHAGE -XR Take 1,000 mg by mouth 2 (two) times daily.   multivitamin with minerals Tabs tablet Take 1 tablet by mouth in the morning.   pantoprazole  40 MG tablet Commonly known as: PROTONIX  Take 40 mg by mouth in the morning.   pregabalin  75 MG capsule Commonly known as: LYRICA  Take 75 mg by mouth 3 (three) times daily.   sacubitril -valsartan  24-26 MG Commonly known as: ENTRESTO  Take 1 tablet by mouth 2 (two) times daily.   sennosides-docusate sodium  8.6-50 MG tablet Commonly known as: SENOKOT-S Take 2 tablets by mouth daily as needed for constipation.   Toujeo  SoloStar 300 UNIT/ML Solostar Pen Generic drug: insulin  glargine (1 Unit Dial) Inject 50 Units into the skin in the morning.   umeclidinium bromide  62.5 MCG/ACT Aepb Commonly  known as: INCRUSE ELLIPTA  Inhale 1 puff into the lungs daily. CAN TAKE HOME HOSPITAL DEVICES - REFILL OUTPATIENT               Durable Medical Equipment  (From admission, onward)           Start     Ordered   07/02/24 1124  For home use only DME Walker rolling  Once       Question Answer Comment  Walker: With 5 Inch Wheels   Patient needs a walker to treat with the following condition Generalized weakness      07/02/24 1123            Contact information for follow-up providers     [name], NP Follow up in  1 week(s).   Specialty: Internal Medicine Contact information: [name] [place] [address] [phone]              Contact information for after-discharge care     Home Medical Care     [facility]) .   Service: Home Health Services Contact information: [address] [place]  [address] [phone]                    Discharge Exam: Fredricka Weights   07/01/24 0844  Weight: (!) 139.3 kg   General exam: Appears calm and comfortable, morbid obesity Respiratory system: Clear to auscultation. Respiratory effort normal. Cardiovascular system: S1 & S2 heard, RRR. No JVD, murmurs, rubs, gallops or clicks. No pedal edema. Gastrointestinal system: Abdomen is nondistended, soft and nontender. No organomegaly or masses felt. Normal bowel sounds heard. Central nervous system: Alert and oriented. No focal neurological deficits. Extremities: Symmetric 5 x 5 power. Skin: No rashes, lesions or ulcers Psychiatry: Judgement and insight appear normal. Mood & affect appropriate.    Condition at discharge: fair  The results of significant diagnostics from this hospitalization (including imaging, microbiology, ancillary and laboratory) are listed below for reference.   Imaging Studies: CT CHEST ABDOMEN PELVIS WO CONTRAST Result Date: 07/01/2024 CLINICAL DATA:   Fall.  Left-sided chest pain. EXAM: CT CHEST, ABDOMEN AND PELVIS WITHOUT CONTRAST TECHNIQUE: Multidetector CT imaging of the chest, abdomen and pelvis was performed following the standard protocol without IV contrast. RADIATION DOSE REDUCTION: This exam was performed according to the departmental dose-optimization program which includes automated exposure control, adjustment of the mA and/or kV according to patient size and/or use of iterative reconstruction technique. COMPARISON:  Chest CTA 02/26/2021 FINDINGS: CT CHEST FINDINGS Cardiovascular: No acute vascular findings are demonstrated on noncontrast imaging. There is atherosclerosis of the aorta, great vessels and coronary arteries. There are calcifications of the aortic valve. The heart size is normal. There is no pericardial effusion. Mediastinum/Nodes: There are no enlarged mediastinal, hilar or axillary lymph nodes. No evidence of mediastinal hematoma. The thyroid gland, trachea and esophagus demonstrate no significant findings. Lungs/Pleura: No pleural effusion or pneumothorax. Moderate centrilobular and paraseptal emphysema with interval resolution of the previously demonstrated patchy ground-glass opacities bilaterally. There is scattered pulmonary scarring and atelectasis. Atelectasis at the left lung base has increased from the previous study. No suspicious pulmonary nodules. Musculoskeletal/Chest wall: There are acute fractures of the left 9th, 10th and 11th ribs. The 9th and 10th rib fractures are moderately displaced. There are segmental fractures of the 10th rib. The sternum and thoracic spine appear intact. CT ABDOMEN AND PELVIS FINDINGS Hepatobiliary: Hepatomegaly with mild contour irregularity and generalized low density. No focal lesions identified on noncontrast imaging. Dependent density within the gallbladder lumen, most consistent with multiple small gallstones. No evidence of gallbladder wall thickening, surrounding inflammation or biliary  ductal dilatation. Pancreas: Unremarkable. No pancreatic ductal dilatation or surrounding inflammatory changes. Spleen: The spleen appears enlarged, measuring approximately 16.1 x 9.8 x 10.1 cm. No focal splenic lesions are identified. No evidence of acute splenic injury. Adrenals/Urinary Tract: Both adrenal glands appear normal. No evidence of urinary tract calculus, hydronephrosis or perinephric soft tissue stranding. No evidence of acute renal injury. The bladder appears normal for its degree of distention. Stomach/Bowel: No enteric contrast administered. The stomach appears unremarkable for its degree of distension. No evidence of bowel wall thickening, distention or surrounding inflammatory change. No evidence  of bowel or mesenteric injury. Mildly prominent stool throughout the colon. The appendix appears normal. Vascular/Lymphatic: There are no enlarged abdominal or pelvic lymph nodes. Aortic and branch vessel atherosclerosis with severe involvement of the femoral arteries bilaterally. No evidence of retroperitoneal hematoma. Reproductive: The prostate gland appears unremarkable. Other: Asymmetric fat in the left inguinal canal. No evidence of ascites, hemoperitoneum or pneumoperitoneum. Musculoskeletal: No acute osseous findings are identified within the abdomen or pelvis. Mild lumbar spondylosis. IMPRESSION: 1. Acute fractures of the left 9th, 10th and 11th ribs as described. No evidence of pneumothorax or hemothorax. 2. No other acute posttraumatic findings identified in the chest, abdomen or pelvis. 3. Hepatosplenomegaly with probable hepatic steatosis. 4. Cholelithiasis without evidence of cholecystitis or biliary ductal dilatation. 5. Aortic Atherosclerosis (ICD10-I70.0) and Emphysema (ICD10-J43.9). Electronically Signed   By: Elsie Perone M.D.   On: 07/01/2024 13:15   CT Head Wo Contrast Result Date: 07/01/2024 EXAM: CT HEAD WITHOUT CONTRAST 07/01/2024 12:12:26 PM TECHNIQUE: CT of the head was  performed without the administration of intravenous contrast. Automated exposure control, iterative reconstruction, and/or weight based adjustment of the mA/kV was utilized to reduce the radiation dose to as low as reasonably achievable. COMPARISON: CT head 12/13/2022. CLINICAL HISTORY: 59 year old male. Trip and fall striking furniture. FINDINGS: BRAIN AND VENTRICLES: No acute hemorrhage. No evidence of acute infarct. Chronic encephalomalacia in the posterior right MCA territory, and affecting the posterior cerebellum to a lesser extent. Chronic lateral and third ventricular enlargement, with tapered cerebral aqueduct and normal size fourth ventricle. Stable ventricle size and configuration since 2024. No transependymal edema. Stable gray white differentiation. No mass effect or midline shift. Calcified atherosclerosis at the skull base. No suspicious intracranial vascular hyperdensity. ORBITS: No acute abnormality. SINUSES: Paranasal sinuses, tympanic cavities and mastoids remain well aerated. SOFT TISSUES AND SKULL: Chronic midline suboccipital craniotomy. Chronic right vertex burr hole in the skull. No acute soft tissue abnormality. No skull fracture. IMPRESSION: 1. No acute intracranial abnormality or acute traumatic injury identified. 2. Chronic right MCA infarct and post operative changes from suboccipital craniotomy. 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 Electronically signed by: Helayne Hurst MD 07/01/2024 12:43 PM EST RP Workstation: [id number]   CT Cervical Spine Wo Contrast Result Date: 07/01/2024 EXAM: CT CERVICAL SPINE WITHOUT CONTRAST 07/01/2024 12:12:26 PM TECHNIQUE: CT of the cervical spine was performed without the administration of intravenous contrast. Multiplanar reformatted images are provided for review. Automated exposure control, iterative reconstruction, and/or weight based adjustment of the mA/kV  was utilized to reduce the radiation dose to as low as reasonably achievable. COMPARISON: CT head reported separately 07/01/2024 and prior CT neck 03/29/2008. CLINICAL HISTORY: 59 year old male with a history of trip and fall striking furniture. FINDINGS: BONES AND ALIGNMENT: Chronic straightening of cervical lordosis. Degenerative ankylosis of the left C2-C3 facets has developed since 2019. Chronic midline suboccipital craniotomy. No acute fracture or traumatic malalignment. DEGENERATIVE CHANGES: Chronic cervical disc and endplate degeneration elsewhere. SOFT TISSUES: Calcified atherosclerosis at the skull base. Partially visible chronic emphysema at the lung apices. Chronic surgical clips. Otherwise negative visible non-contrast neck soft tissues. No prevertebral soft tissue swelling. LUNGS: Partially visible chronic emphysema at the lung apices. IMPRESSION: 1. No acute traumatic injury identified in the cervical spine. 2. Degenerative left C2-C3 facet ankylosis. 3. Emphysema (ICD10-J43.9). Electronically signed by: Helayne Hurst MD 07/01/2024 12:37 PM EST RP Workstation: [id number]    Microbiology: Results for orders placed or performed during the hospital encounter  of 05/03/24  Resp panel by RT-PCR (RSV, Flu A&B, Covid) Anterior Nasal Swab     Status: None   Collection Time: 05/03/24 10:57 PM   Specimen: Anterior Nasal Swab  Result Value Ref Range Status   SARS Coronavirus 2 by RT PCR NEGATIVE NEGATIVE Final    Comment: (NOTE) SARS-CoV-2 target nucleic acids are NOT DETECTED.  The SARS-CoV-2 RNA is generally detectable in upper respiratory specimens during the acute phase of infection. The lowest concentration of SARS-CoV-2 viral copies this assay can detect is 138 copies/mL. A negative result does not preclude SARS-Cov-2 infection and should not be used as the sole basis for treatment or other patient management decisions. A negative result may occur with  improper specimen collection/handling,  submission of specimen other than nasopharyngeal swab, presence of viral mutation(s) within the areas targeted by this assay, and inadequate number of viral copies(<138 copies/mL). A negative result must be combined with clinical observations, patient history, and epidemiological information. The expected result is Negative.  Fact Sheet for Patients:  bloggercourse.com  Fact Sheet for Healthcare Providers:  seriousbroker.it  This test is no t yet approved or cleared by the [place]  FDA and  has been authorized for detection and/or diagnosis of SARS-CoV-2 by FDA under an Emergency Use Authorization (EUA). This EUA will remain  in effect (meaning this test can be used) for the duration of the COVID-19 declaration under Section 564(b)(1) of the Act, 21 U.S.C.section 360bbb-3(b)(1), unless the authorization is terminated  or revoked sooner.       Influenza A by PCR NEGATIVE NEGATIVE Final   Influenza B by PCR NEGATIVE NEGATIVE Final    Comment: (NOTE) The Xpert Xpress SARS-CoV-2/FLU/RSV plus assay is intended as an aid in the diagnosis of influenza from Nasopharyngeal swab specimens and should not be used as a sole basis for treatment. Nasal washings and aspirates are unacceptable for Xpert Xpress SARS-CoV-2/FLU/RSV testing.  Fact Sheet for Patients: bloggercourse.com  Fact Sheet for Healthcare Providers: seriousbroker.it  This test is not yet approved or cleared by the [place]  FDA and has been authorized for detection and/or diagnosis of SARS-CoV-2 by FDA under an Emergency Use Authorization (EUA). This EUA will remain in effect (meaning this test can be used) for the duration of the COVID-19 declaration under Section 564(b)(1) of the Act, 21 U.S.C. section 360bbb-3(b)(1), unless the authorization is terminated or revoked.     Resp Syncytial Virus by PCR NEGATIVE  NEGATIVE Final    Comment: (NOTE) Fact Sheet for Patients: bloggercourse.com  Fact Sheet for Healthcare Providers: seriousbroker.it  This test is not yet approved or cleared by the [place]  FDA and has been authorized for detection and/or diagnosis of SARS-CoV-2 by FDA under an Emergency Use Authorization (EUA). This EUA will remain in effect (meaning this test can be used) for the duration of the COVID-19 declaration under Section 564(b)(1) of the Act, 21 U.S.C. section 360bbb-3(b)(1), unless the authorization is terminated or revoked.  Performed at [facility], [address] [place], [place] [address]   Blood culture (routine x 2)     Status: Abnormal   Collection Time: 05/03/24 10:58 PM   Specimen: BLOOD  Result Value Ref Range Status   Specimen Description   Final    BLOOD RIGHT ANTECUBITAL Performed at [facility], [address] [place], [place] [address]    Special Requests   Final    BOTTLES DRAWN AEROBIC AND ANAEROBIC Blood Culture results may not be optimal due to  an inadequate volume of blood received in culture bottles Performed at [facility], [address] [place], [place] [address]    Culture  Setup Time   Final    Organism ID to follow GRAM POSITIVE COCCI IN BOTH AEROBIC AND ANAEROBIC BOTTLES CRITICAL RESULT CALLED TO, READ BACK BY AND VERIFIED WITH: LUM HILARIO GOWER D AT 1546 05/04/24 RAM Performed at [facility], [address] [place], [place] [address]    Culture (A)  Final    STAPHYLOCOCCUS HOMINIS THE SIGNIFICANCE OF ISOLATING THIS ORGANISM FROM A SINGLE SET OF BLOOD CULTURES WHEN MULTIPLE SETS ARE DRAWN IS UNCERTAIN. PLEASE NOTIFY THE MICROBIOLOGY DEPARTMENT WITHIN ONE WEEK IF SPECIATION AND SENSITIVITIES ARE REQUIRED. Performed at [facility], [address], [place], [place] [address]    Report Status 05/07/2024 FINAL  Final  Blood  Culture ID Panel (Reflexed)     Status: Abnormal   Collection Time: 05/03/24 10:58 PM  Result Value Ref Range Status   Enterococcus faecalis NOT DETECTED NOT DETECTED Final   Enterococcus Faecium NOT DETECTED NOT DETECTED Final   Listeria monocytogenes NOT DETECTED NOT DETECTED Final   Staphylococcus species DETECTED (A) NOT DETECTED Final    Comment: CRITICAL RESULT CALLED TO, READ BACK BY AND VERIFIED WITH: MADISON H., PHARM D AT 1546 05/04/24 RAM    Staphylococcus aureus (BCID) NOT DETECTED NOT DETECTED Final   Staphylococcus epidermidis NOT DETECTED NOT DETECTED Final   Staphylococcus lugdunensis NOT DETECTED NOT DETECTED Final   Streptococcus species NOT DETECTED NOT DETECTED Final   Streptococcus agalactiae NOT DETECTED NOT DETECTED Final   Streptococcus pneumoniae NOT DETECTED NOT DETECTED Final   Streptococcus pyogenes NOT DETECTED NOT DETECTED Final   A.calcoaceticus-baumannii NOT DETECTED NOT DETECTED Final   Bacteroides fragilis NOT DETECTED NOT DETECTED Final   Enterobacterales NOT DETECTED NOT DETECTED Final   Enterobacter cloacae complex NOT DETECTED NOT DETECTED Final   Escherichia coli NOT DETECTED NOT DETECTED Final   Klebsiella aerogenes NOT DETECTED NOT DETECTED Final   Klebsiella oxytoca NOT DETECTED NOT DETECTED Final   Klebsiella pneumoniae NOT DETECTED NOT DETECTED Final   Proteus species NOT DETECTED NOT DETECTED Final   Salmonella species NOT DETECTED NOT DETECTED Final   Serratia marcescens NOT DETECTED NOT DETECTED Final   Haemophilus influenzae NOT DETECTED NOT DETECTED Final   Neisseria meningitidis NOT DETECTED NOT DETECTED Final   Pseudomonas aeruginosa NOT DETECTED NOT DETECTED Final   Stenotrophomonas maltophilia NOT DETECTED NOT DETECTED Final   Candida albicans NOT DETECTED NOT DETECTED Final   Candida auris NOT DETECTED NOT DETECTED Final   Candida glabrata NOT DETECTED NOT DETECTED Final   Candida krusei NOT DETECTED NOT DETECTED Final    Candida parapsilosis NOT DETECTED NOT DETECTED Final   Candida tropicalis NOT DETECTED NOT DETECTED Final   Cryptococcus neoformans/gattii NOT DETECTED NOT DETECTED Final    Comment: Performed at [facility], [address] [place], [place] [address]  Blood culture (routine x 2)     Status: None   Collection Time: 05/03/24 11:03 PM   Specimen: BLOOD  Result Value Ref Range Status   Specimen Description BLOOD BLOOD RIGHT FOREARM  Final   Special Requests   Final    BOTTLES DRAWN AEROBIC AND ANAEROBIC Blood Culture results may not be optimal due to an inadequate volume of blood received in culture bottles   Culture   Final    NO GROWTH 5 DAYS Performed at [facility], [address], [place], [place] [address]    Report Status 05/08/2024 FINAL  Final  Respiratory (~20 pathogens) panel by PCR     Status: None   Collection Time: 05/04/24  9:57 AM   Specimen: Nasopharyngeal Swab; Respiratory  Result Value Ref Range Status   Adenovirus NOT DETECTED NOT DETECTED Final   Coronavirus 229E NOT DETECTED NOT DETECTED Final    Comment: (NOTE) The Coronavirus on the Respiratory Panel, DOES NOT test for the novel  Coronavirus (2019 nCoV)    Coronavirus HKU1 NOT DETECTED NOT DETECTED Final   Coronavirus NL63 NOT DETECTED NOT DETECTED Final   Coronavirus OC43 NOT DETECTED NOT DETECTED Final   Metapneumovirus NOT DETECTED NOT DETECTED Final   Rhinovirus / Enterovirus NOT DETECTED NOT DETECTED Final   Influenza A NOT DETECTED NOT DETECTED Final   Influenza B NOT DETECTED NOT DETECTED Final   Parainfluenza Virus 1 NOT DETECTED NOT DETECTED Final   Parainfluenza Virus 2 NOT DETECTED NOT DETECTED Final   Parainfluenza Virus 3 NOT DETECTED NOT DETECTED Final   Parainfluenza Virus 4 NOT DETECTED NOT DETECTED Final   Respiratory Syncytial Virus NOT DETECTED NOT DETECTED Final   Bordetella pertussis NOT DETECTED NOT DETECTED Final   Bordetella Parapertussis NOT DETECTED NOT  DETECTED Final   Chlamydophila pneumoniae NOT DETECTED NOT DETECTED Final   Mycoplasma pneumoniae NOT DETECTED NOT DETECTED Final    Comment: Performed at [facility], [address], [place], [place] [address]    Labs: CBC: Recent Labs  Lab 07/01/24 1146 07/02/24 0338  WBC 12.7* 9.9  NEUTROABS 10.5*  --   HGB 9.2* 8.3*  HCT 29.8* 26.5*  MCV 79.0* 79.6*  PLT 116* 102*   Basic Metabolic Panel: Recent Labs  Lab 07/01/24 1146 07/02/24 0338  NA 133* 132*  K 4.5 4.2  CL 95* 98  CO2 21* 23  GLUCOSE 191* 188*  BUN 27* 28*  CREATININE 1.84* 1.69*  CALCIUM  9.0 8.4*   Liver Function Tests: Recent Labs  Lab 07/02/24 0338  AST 32  ALT 28  ALKPHOS 49  BILITOT 0.3  PROT 6.6  ALBUMIN 4.0   CBG: Recent Labs  Lab 07/01/24 1642 07/01/24 2109 07/02/24 0742  GLUCAP 187* 216* 164*    Discharge time spent: 35 minutes.  Signed: Murvin Mana, MD [facility] 07/02/2024 "
